# Patient Record
Sex: Female | Born: 1945 | Race: White | Hispanic: No | Marital: Single | Smoking: Former smoker
Health system: Southern US, Community
[De-identification: ages and names within clinical notes are randomized; demographics above are authoritative.]

## PROBLEM LIST (undated history)

## (undated) DIAGNOSIS — I10 Essential (primary) hypertension: Secondary | ICD-10-CM

## (undated) DIAGNOSIS — Z972 Presence of dental prosthetic device (complete) (partial): Secondary | ICD-10-CM

## (undated) DIAGNOSIS — E119 Type 2 diabetes mellitus without complications: Secondary | ICD-10-CM

## (undated) DIAGNOSIS — J449 Chronic obstructive pulmonary disease, unspecified: Secondary | ICD-10-CM

## (undated) DIAGNOSIS — G709 Myoneural disorder, unspecified: Secondary | ICD-10-CM

## (undated) DIAGNOSIS — M1990 Unspecified osteoarthritis, unspecified site: Secondary | ICD-10-CM

## (undated) DIAGNOSIS — R0600 Dyspnea, unspecified: Secondary | ICD-10-CM

## (undated) DIAGNOSIS — M25511 Pain in right shoulder: Secondary | ICD-10-CM

## (undated) DIAGNOSIS — E785 Hyperlipidemia, unspecified: Secondary | ICD-10-CM

## (undated) HISTORY — DX: Type 2 diabetes mellitus without complications: E11.9

## (undated) HISTORY — PX: CHOLECYSTECTOMY: SHX55

## (undated) HISTORY — DX: Essential (primary) hypertension: I10

## (undated) HISTORY — DX: Hyperlipidemia, unspecified: E78.5

## (undated) HISTORY — DX: Chronic obstructive pulmonary disease, unspecified: J44.9

## (undated) HISTORY — PX: EYE SURGERY: SHX253

## (undated) HISTORY — PX: ABDOMINAL HYSTERECTOMY: SHX81

## (undated) HISTORY — PX: TOTAL ABDOMINAL HYSTERECTOMY W/ BILATERAL SALPINGOOPHORECTOMY: SHX83

---

## 2000-06-17 HISTORY — PX: CARPAL TUNNEL RELEASE: SHX101

## 2012-12-17 ENCOUNTER — Ambulatory Visit: Payer: Self-pay | Admitting: Family Medicine

## 2014-12-28 DIAGNOSIS — J449 Chronic obstructive pulmonary disease, unspecified: Secondary | ICD-10-CM | POA: Diagnosis not present

## 2014-12-28 DIAGNOSIS — Z23 Encounter for immunization: Secondary | ICD-10-CM | POA: Diagnosis not present

## 2014-12-28 DIAGNOSIS — E1140 Type 2 diabetes mellitus with diabetic neuropathy, unspecified: Secondary | ICD-10-CM | POA: Diagnosis not present

## 2014-12-28 DIAGNOSIS — I10 Essential (primary) hypertension: Secondary | ICD-10-CM | POA: Diagnosis not present

## 2014-12-28 DIAGNOSIS — H449 Unspecified disorder of globe: Secondary | ICD-10-CM | POA: Diagnosis not present

## 2014-12-28 DIAGNOSIS — E785 Hyperlipidemia, unspecified: Secondary | ICD-10-CM | POA: Diagnosis not present

## 2014-12-28 DIAGNOSIS — Z0000 Encounter for general adult medical examination without abnormal findings: Secondary | ICD-10-CM | POA: Diagnosis not present

## 2015-01-01 ENCOUNTER — Ambulatory Visit: Payer: Self-pay | Admitting: Physician Assistant

## 2015-01-01 DIAGNOSIS — I10 Essential (primary) hypertension: Secondary | ICD-10-CM | POA: Diagnosis not present

## 2015-01-01 DIAGNOSIS — M5136 Other intervertebral disc degeneration, lumbar region: Secondary | ICD-10-CM | POA: Diagnosis not present

## 2015-01-28 DIAGNOSIS — I10 Essential (primary) hypertension: Secondary | ICD-10-CM | POA: Diagnosis not present

## 2015-01-28 DIAGNOSIS — E119 Type 2 diabetes mellitus without complications: Secondary | ICD-10-CM | POA: Diagnosis not present

## 2015-03-31 DIAGNOSIS — I10 Essential (primary) hypertension: Secondary | ICD-10-CM | POA: Diagnosis not present

## 2015-03-31 DIAGNOSIS — E785 Hyperlipidemia, unspecified: Secondary | ICD-10-CM | POA: Diagnosis not present

## 2015-03-31 DIAGNOSIS — E119 Type 2 diabetes mellitus without complications: Secondary | ICD-10-CM | POA: Diagnosis not present

## 2015-04-20 DIAGNOSIS — H2513 Age-related nuclear cataract, bilateral: Secondary | ICD-10-CM | POA: Diagnosis not present

## 2015-07-14 DIAGNOSIS — I10 Essential (primary) hypertension: Secondary | ICD-10-CM | POA: Insufficient documentation

## 2015-07-14 DIAGNOSIS — E1169 Type 2 diabetes mellitus with other specified complication: Secondary | ICD-10-CM | POA: Insufficient documentation

## 2015-07-14 DIAGNOSIS — E1142 Type 2 diabetes mellitus with diabetic polyneuropathy: Secondary | ICD-10-CM | POA: Insufficient documentation

## 2015-07-14 DIAGNOSIS — E785 Hyperlipidemia, unspecified: Secondary | ICD-10-CM | POA: Insufficient documentation

## 2015-07-14 DIAGNOSIS — E1159 Type 2 diabetes mellitus with other circulatory complications: Secondary | ICD-10-CM | POA: Insufficient documentation

## 2015-07-26 ENCOUNTER — Ambulatory Visit (INDEPENDENT_AMBULATORY_CARE_PROVIDER_SITE_OTHER): Payer: Medicare Other | Admitting: Family Medicine

## 2015-07-26 ENCOUNTER — Encounter: Payer: Self-pay | Admitting: Family Medicine

## 2015-07-26 VITALS — BP 110/69 | HR 60 | Temp 97.9°F | Ht 61.0 in | Wt 204.0 lb

## 2015-07-26 DIAGNOSIS — E119 Type 2 diabetes mellitus without complications: Secondary | ICD-10-CM | POA: Diagnosis not present

## 2015-07-26 DIAGNOSIS — I10 Essential (primary) hypertension: Secondary | ICD-10-CM | POA: Diagnosis not present

## 2015-07-26 DIAGNOSIS — M159 Polyosteoarthritis, unspecified: Secondary | ICD-10-CM | POA: Insufficient documentation

## 2015-07-26 DIAGNOSIS — M153 Secondary multiple arthritis: Secondary | ICD-10-CM

## 2015-07-26 DIAGNOSIS — E785 Hyperlipidemia, unspecified: Secondary | ICD-10-CM

## 2015-07-26 LAB — LP+ALT+AST PICCOLO, WAIVED
ALT (SGPT) Piccolo, Waived: 15 U/L (ref 10–47)
AST (SGOT) Piccolo, Waived: 17 U/L (ref 11–38)
Chol/HDL Ratio Piccolo,Waive: 3.4 mg/dL
Cholesterol Piccolo, Waived: 132 mg/dL (ref <200)
HDL Chol Piccolo, Waived: 39 mg/dL — ABNORMAL LOW (ref >59)
LDL Chol Calc Piccolo Waived: 68 mg/dL (ref <100)
Triglycerides Piccolo,Waived: 125 mg/dL (ref <150)
VLDL Chol Calc Piccolo,Waive: 25 mg/dL (ref <30)

## 2015-07-26 LAB — BAYER DCA HB A1C WAIVED: HB A1C (BAYER DCA - WAIVED): 8.3 % — ABNORMAL HIGH (ref <7.0)

## 2015-07-26 MED ORDER — INSULIN DETEMIR 100 UNIT/ML FLEXPEN: 60.0000 [IU] | PEN_INJECTOR | Freq: Every day | SUBCUTANEOUS | Status: DC

## 2015-07-26 MED ORDER — LOVASTATIN 40 MG PO TABS: 40.0000 mg | ORAL_TABLET | Freq: Every day | ORAL | Status: DC

## 2015-07-26 MED ORDER — INSULIN LISPRO 100 UNIT/ML (KWIKPEN): 10.0000 [IU] | PEN_INJECTOR | Freq: Three times a day (TID) | SUBCUTANEOUS | Status: DC

## 2015-07-26 MED ORDER — SAXAGLIPTIN HCL 5 MG PO TABS: 5.0000 mg | ORAL_TABLET | Freq: Every day | ORAL | Status: DC

## 2015-07-26 MED ORDER — CLONIDINE HCL 0.1 MG PO TABS: 0.1000 mg | ORAL_TABLET | Freq: Two times a day (BID) | ORAL | Status: DC

## 2015-07-26 MED ORDER — ATENOLOL 100 MG PO TABS: 100.0000 mg | ORAL_TABLET | Freq: Every day | ORAL | Status: DC

## 2015-07-26 MED ORDER — SPIRONOLACTONE 25 MG PO TABS: 25.0000 mg | ORAL_TABLET | Freq: Every day | ORAL | Status: DC

## 2015-07-26 NOTE — Assessment & Plan Note (Signed)
Problems with left knee and left shoulder will refer to orthopedics

## 2015-07-26 NOTE — Assessment & Plan Note (Signed)
The current medical regimen is effective;  continue present plan and medications.  

## 2015-07-26 NOTE — Assessment & Plan Note (Signed)
With patient's blood pressure and edema will stop amlodipine patient to hold prescription We'll start spironolactone and observe response

## 2015-07-26 NOTE — Assessment & Plan Note (Signed)
With hemoglobin A1c of 8.3 today indicating poor control discuss adding 10 units of insulin at lunchtime With stress decreasing with daughter moving out we will try to do better with diet
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
## 2015-07-27 ENCOUNTER — Encounter: Payer: Self-pay | Admitting: Family Medicine

## 2015-07-27 LAB — BASIC METABOLIC PANEL
BUN/Creatinine Ratio: 12 (ref 11–26)
BUN: 9 mg/dL (ref 8–27)
CO2: 26 mmol/L (ref 18–29)
Calcium: 9.5 mg/dL (ref 8.7–10.3)
Chloride: 100 mmol/L (ref 97–108)
Creatinine, Ser: 0.75 mg/dL (ref 0.57–1.00)
GFR calc Af Amer: 95 mL/min/{1.73_m2} (ref >59)
GFR calc non Af Amer: 83 mL/min/{1.73_m2} (ref >59)
Glucose: 183 mg/dL — ABNORMAL HIGH (ref 65–99)
Potassium: 4.7 mmol/L (ref 3.5–5.2)
Sodium: 141 mmol/L (ref 134–144)

## 2015-08-18 DIAGNOSIS — H538 Other visual disturbances: Secondary | ICD-10-CM | POA: Diagnosis not present

## 2015-08-27 DIAGNOSIS — H268 Other specified cataract: Secondary | ICD-10-CM | POA: Diagnosis not present

## 2015-09-14 DIAGNOSIS — H25043 Posterior subcapsular polar age-related cataract, bilateral: Secondary | ICD-10-CM | POA: Diagnosis not present

## 2015-09-20 ENCOUNTER — Ambulatory Visit
Admission: RE | Admit: 2015-09-20 | Discharge: 2015-09-20 | Disposition: A | Discharge status: Home / Self Care | Payer: Medicare Other | Source: Ambulatory Visit | Attending: Ophthalmology | Admitting: Ophthalmology

## 2015-09-20 ENCOUNTER — Ambulatory Visit: Payer: Medicare Other | Admitting: Anesthesiology

## 2015-09-20 ENCOUNTER — Encounter
Admission: RE | Disposition: A | Discharge status: Home / Self Care | Payer: Self-pay | Source: Ambulatory Visit | Attending: Ophthalmology

## 2015-09-20 DIAGNOSIS — E7800 Pure hypercholesterolemia, unspecified: Secondary | ICD-10-CM | POA: Diagnosis not present

## 2015-09-20 DIAGNOSIS — I10 Essential (primary) hypertension: Secondary | ICD-10-CM | POA: Diagnosis not present

## 2015-09-20 DIAGNOSIS — Z888 Allergy status to other drugs, medicaments and biological substances status: Secondary | ICD-10-CM | POA: Insufficient documentation

## 2015-09-20 DIAGNOSIS — Z87891 Personal history of nicotine dependence: Secondary | ICD-10-CM | POA: Diagnosis not present

## 2015-09-20 DIAGNOSIS — Z9049 Acquired absence of other specified parts of digestive tract: Secondary | ICD-10-CM | POA: Insufficient documentation

## 2015-09-20 DIAGNOSIS — E119 Type 2 diabetes mellitus without complications: Secondary | ICD-10-CM | POA: Diagnosis not present

## 2015-09-20 DIAGNOSIS — Z79899 Other long term (current) drug therapy: Secondary | ICD-10-CM | POA: Insufficient documentation

## 2015-09-20 DIAGNOSIS — H25042 Posterior subcapsular polar age-related cataract, left eye: Secondary | ICD-10-CM | POA: Insufficient documentation

## 2015-09-20 DIAGNOSIS — M1990 Unspecified osteoarthritis, unspecified site: Secondary | ICD-10-CM | POA: Diagnosis not present

## 2015-09-20 DIAGNOSIS — H269 Unspecified cataract: Secondary | ICD-10-CM | POA: Diagnosis present

## 2015-09-20 DIAGNOSIS — H25043 Posterior subcapsular polar age-related cataract, bilateral: Secondary | ICD-10-CM | POA: Diagnosis not present

## 2015-09-20 DIAGNOSIS — Z794 Long term (current) use of insulin: Secondary | ICD-10-CM | POA: Diagnosis not present

## 2015-09-20 HISTORY — PX: CATARACT EXTRACTION W/PHACO: SHX586

## 2015-09-20 HISTORY — DX: Unspecified osteoarthritis, unspecified site: M19.90

## 2015-09-20 LAB — GLUCOSE, CAPILLARY
Glucose-Capillary: 180 mg/dL — ABNORMAL HIGH (ref 65–99)
Glucose-Capillary: 212 mg/dL — ABNORMAL HIGH (ref 65–99)

## 2015-09-20 SURGERY — PHACOEMULSIFICATION, CATARACT, WITH IOL INSERTION
Anesthesia: Monitor Anesthesia Care | Laterality: Left | Wound class: Clean

## 2015-09-20 MED ORDER — MIDAZOLAM HCL 5 MG/5ML IJ SOLN
INTRAMUSCULAR | Status: DC | PRN
  Administered 2015-09-20: 2 mg via INTRAVENOUS

## 2015-09-20 MED ORDER — NA HYALUR & NA CHOND-NA HYALUR 0.4-0.35 ML IO KIT
PACK | INTRAOCULAR | Status: DC | PRN
  Administered 2015-09-20: 1 mL via INTRAOCULAR

## 2015-09-20 MED ORDER — TIMOLOL MALEATE 0.5 % OP SOLN
OPHTHALMIC | Status: DC | PRN
  Administered 2015-09-20: 1 [drp] via OPHTHALMIC

## 2015-09-20 MED ORDER — CEFUROXIME OPHTHALMIC INJECTION 1 MG/0.1 ML
INJECTION | OPHTHALMIC | Status: DC | PRN
  Administered 2015-09-20: .3 mL via INTRACAMERAL

## 2015-09-20 MED ORDER — LIDOCAINE HCL (PF) 4 % IJ SOLN
INTRAOCULAR | Status: DC | PRN
  Administered 2015-09-20: 2 mL via OPHTHALMIC

## 2015-09-20 MED ORDER — BSS IO SOLN
INTRAOCULAR | Status: DC | PRN
  Administered 2015-09-20: 174 mL via OPHTHALMIC
  Administered 2015-09-20: 08:00:00 via OPHTHALMIC

## 2015-09-20 MED ORDER — FENTANYL CITRATE (PF) 100 MCG/2ML IJ SOLN
INTRAMUSCULAR | Status: DC | PRN
  Administered 2015-09-20: 50 ug via INTRAVENOUS

## 2015-09-20 MED ORDER — PROPARACAINE HCL 0.5 % OP SOLN
1.0000 [drp] | Freq: Once | OPHTHALMIC | Status: AC
  Administered 2015-09-20: 1 [drp] via OPHTHALMIC

## 2015-09-20 MED ORDER — BRIMONIDINE TARTRATE 0.2 % OP SOLN
OPHTHALMIC | Status: DC | PRN
  Administered 2015-09-20: 1 [drp] via OPHTHALMIC

## 2015-09-20 MED ORDER — LACTATED RINGERS IV SOLN: INTRAVENOUS | Status: DC

## 2015-09-20 MED ORDER — OXYCODONE HCL 5 MG PO TABS: 5.0000 mg | ORAL_TABLET | Freq: Once | ORAL | Status: DC | PRN

## 2015-09-20 MED ORDER — ARMC OPHTHALMIC DILATING GEL
1.0000 "application " | OPHTHALMIC | Status: DC | PRN
  Administered 2015-09-20 (×2): 1 via OPHTHALMIC

## 2015-09-20 MED ORDER — OXYCODONE HCL 5 MG/5ML PO SOLN: 5.0000 mg | Freq: Once | ORAL | Status: DC | PRN

## 2015-09-20 MED ORDER — POVIDONE-IODINE 5 % OP SOLN
1.0000 "application " | OPHTHALMIC | Status: DC | PRN
  Administered 2015-09-20: 1 via OPHTHALMIC

## 2015-09-20 SURGICAL SUPPLY — 29 items
APPLICATOR COTTON TIP 3IN (MISCELLANEOUS) ×2 IMPLANT
CANNULA ANT/CHMB 27GA (MISCELLANEOUS) ×2 IMPLANT
DISSECTOR HYDRO NUCLEUS 50X22 (MISCELLANEOUS) ×2 IMPLANT
GLOVE BIO SURGEON STRL SZ7 (GLOVE) ×2 IMPLANT
GLOVE SURG LX 6.5 MICRO (GLOVE) ×1
GLOVE SURG LX STRL 6.5 MICRO (GLOVE) ×1 IMPLANT
GOWN STRL REUS W/ TWL LRG LVL3 (GOWN DISPOSABLE) ×2 IMPLANT
GOWN STRL REUS W/TWL LRG LVL3 (GOWN DISPOSABLE) ×2
LENS IOL ACRSF IQ PC 22.0 (Intraocular Lens) ×1 IMPLANT
LENS IOL ACRYSOF IQ POST 22.0 (Intraocular Lens) ×2 IMPLANT
MARKER SKIN SURG W/RULER VIO (MISCELLANEOUS) ×2 IMPLANT
NEEDLE FILTER BLUNT 18X 1/2SAF (NEEDLE) ×1
NEEDLE FILTER BLUNT 18X1 1/2 (NEEDLE) ×1 IMPLANT
PACK CATARACT BRASINGTON (MISCELLANEOUS) ×2 IMPLANT
PACK EYE AFTER SURG (MISCELLANEOUS) ×2 IMPLANT
PACK OPTHALMIC (MISCELLANEOUS) ×2 IMPLANT
RING MALYGIN 7.0 (MISCELLANEOUS) IMPLANT
SOL BAL SALT 15ML (MISCELLANEOUS)
SOLUTION BAL SALT 15ML (MISCELLANEOUS) IMPLANT
SUT ETHILON 10-0 CS-B-6CS-B-6 (SUTURE)
SUT VICRYL  9 0 (SUTURE)
SUT VICRYL 9 0 (SUTURE) IMPLANT
SUTURE EHLN 10-0 CS-B-6CS-B-6 (SUTURE) IMPLANT
SYR 3ML LL SCALE MARK (SYRINGE) ×2 IMPLANT
SYR TB 1ML LUER SLIP (SYRINGE) ×2 IMPLANT
WATER STERILE IRR 250ML POUR (IV SOLUTION) ×2 IMPLANT
WATER STERILE IRR 500ML POUR (IV SOLUTION) IMPLANT
WICK EYE OCUCEL (MISCELLANEOUS) IMPLANT
WIPE NON LINTING 3.25X3.25 (MISCELLANEOUS) ×2 IMPLANT

## 2015-09-20 NOTE — Anesthesia Preprocedure Evaluation (Signed)
Anesthesia Evaluation    Airway Mallampati: II  TM Distance: >3 FB Neck ROM: Full    Dental no notable dental hx.    Pulmonary COPD, former smoker,    Pulmonary exam normal breath sounds clear to auscultation       Cardiovascular hypertension, Normal cardiovascular exam Rhythm:Regular Rate:Normal     Neuro/Psych    GI/Hepatic   Endo/Other  diabetes, Type 2  Renal/GU      Musculoskeletal   Abdominal   Peds  Hematology   Anesthesia Other Findings   Reproductive/Obstetrics                             Anesthesia Physical Anesthesia Plan  ASA: III  Anesthesia Plan: MAC   Post-op Pain Management:    Induction: Intravenous  Airway Management Planned:   Additional Equipment:   Intra-op Plan:   Post-operative Plan: Extubation in OR  Informed Consent: I have reviewed the patients History and Physical, chart, labs and discussed the procedure including the risks, benefits and alternatives for the proposed anesthesia with the patient or authorized representative who has indicated his/her understanding and acceptance.   Dental advisory given  Plan Discussed with: CRNA  Anesthesia Plan Comments:         Anesthesia Quick Evaluation

## 2015-09-20 NOTE — Discharge Instructions (Signed)

## 2015-09-20 NOTE — Transfer of Care (Signed)
Immediate Anesthesia Transfer of Care Note  Patient: Tami Cummings  Procedure(s) Performed: Procedure(s) with comments: CATARACT EXTRACTION PHACO AND INTRAOCULAR LENS PLACEMENT (IOC) (Left) - DIABETIC - insulin  Patient Location: PACU  Anesthesia Type: MAC  Level of Consciousness: awake, alert  and patient cooperative  Airway and Oxygen Therapy: Patient Spontanous Breathing and Patient connected to supplemental oxygen  Post-op Assessment: Post-op Vital signs reviewed, Patient's Cardiovascular Status Stable, Respiratory Function Stable, Patent Airway and No signs of Nausea or vomiting  Post-op Vital Signs: Reviewed and stable  Complications: No apparent anesthesia complications

## 2015-09-20 NOTE — Anesthesia Procedure Notes (Signed)
Procedure Name: MAC Performed by: Pansey Pinheiro Pre-anesthesia Checklist: Patient identified, Emergency Drugs available, Suction available, Timeout performed and Patient being monitored Patient Re-evaluated:Patient Re-evaluated prior to inductionOxygen Delivery Method: Nasal cannula Placement Confirmation: positive ETCO2     

## 2015-09-20 NOTE — H&P (Signed)
H+P reviewed and is up to date, please see paper chart.  

## 2015-09-20 NOTE — Op Note (Signed)
Date of Surgery: 09/20/2015  PREOPERATIVE DIAGNOSES: Visually significant posterior subcapsular cataract, left eye.  POSTOPERATIVE DIAGNOSES: Same  PROCEDURES PERFORMED: Cataract extraction with intraocular lens implant, left eye.  SURGEON: Almon Hercules, M.D.  ANESTHESIA: MAC and topical  IMPLANTS: AcrySof IQ [id number] +22.0   Implant Name Type Inv. Item Serial No. Manufacturer Lot No. LRB No. Used  IMPLANT LENS - [id number] Intraocular Lens IMPLANT LENS [id number] ALCON   Left 1    COMPLICATIONS: None.  DESCRIPTION OF PROCEDURE: Therapeutic options were discussed with the patient preoperatively, including a discussion of risks and benefits of surgery. Informed consent was obtained. An IOL-Master and immersion biometry were used to take the lens measurements, and a dilated fundus exam was performed within 6 months of the surgical date.  The patient was premedicated and brought to the operating room and placed on the operating table in the supine position. After adequate anesthesia, the patient was prepped and draped in the usual sterile ophthalmic fashion. A wire lid speculum was inserted and the microscope was positioned. A Superblade was used to create a paracentesis site at the limbus and a small amount of dilute preservative free lidocaine was instilled into the anterior chamber, followed by dispersive viscoelastic. A clear corneal incision was created temporally using a 2.4 mm keratome blade. Capsulorrhexis was then performed. In situ phacoemulsification was performed.  Cortical material was removed with the irrigation-aspiration unit. Dispersive viscoelastic was instilled to open the capsular bag. A posterior chamber intraocular lens with the specifications above was inserted and positioned. Irrigation-aspiration was used to remove all viscoelastic. Cefuroxime 1cc was instilled into the anterior chamber, and the corneal incision was checked and found to be water tight. The eyelid  speculum was removed.  The operative eye was covered with protective goggles after instilling 1 drop of timolol and brimonidine. The patient tolerated the procedure well. There were no complications.

## 2015-09-20 NOTE — Anesthesia Postprocedure Evaluation (Signed)
  Anesthesia Post-op Note  Patient: Tami Cummings  Procedure(s) Performed: Procedure(s) with comments: CATARACT EXTRACTION PHACO AND INTRAOCULAR LENS PLACEMENT (IOC) (Left) - DIABETIC - insulin  Anesthesia type:MAC  Patient location: PACU  Post pain: Pain level controlled  Post assessment: Post-op Vital signs reviewed, Patient's Cardiovascular Status Stable, Respiratory Function Stable, Patent Airway and No signs of Nausea or vomiting  Post vital signs: Reviewed and stable  Last Vitals:  Filed Vitals:   09/20/15 0829  BP: 141/71  Pulse: 60  Temp:   Resp: 17    Level of consciousness: awake, alert  and patient cooperative  Complications: No apparent anesthesia complications

## 2015-09-21 ENCOUNTER — Encounter: Payer: Self-pay | Admitting: Ophthalmology

## 2015-09-27 ENCOUNTER — Encounter: Payer: Self-pay | Admitting: Family Medicine

## 2015-09-27 ENCOUNTER — Ambulatory Visit (INDEPENDENT_AMBULATORY_CARE_PROVIDER_SITE_OTHER): Payer: Medicare Other | Admitting: Family Medicine

## 2015-09-27 VITALS — BP 192/80 | HR 69 | Temp 98.2°F | Ht 61.3 in | Wt 206.0 lb

## 2015-09-27 DIAGNOSIS — I10 Essential (primary) hypertension: Secondary | ICD-10-CM | POA: Diagnosis not present

## 2015-09-27 DIAGNOSIS — E119 Type 2 diabetes mellitus without complications: Secondary | ICD-10-CM | POA: Diagnosis not present

## 2015-09-27 DIAGNOSIS — M153 Secondary multiple arthritis: Secondary | ICD-10-CM

## 2015-09-27 DIAGNOSIS — Z23 Encounter for immunization: Secondary | ICD-10-CM | POA: Diagnosis not present

## 2015-09-27 MED ORDER — CLONIDINE HCL 0.2 MG PO TABS: 0.2000 mg | ORAL_TABLET | Freq: Two times a day (BID) | ORAL | Status: DC

## 2015-09-27 NOTE — Progress Notes (Signed)
BP 192/80 mmHg  Pulse 69  Temp(Src) 98.2 F (36.8 C)  Ht 5' 1.3" (1.557 m)  Wt 206 lb (93.441 kg)  BMI 38.54 kg/m2  SpO2 99%   Subjective:    [name], female    DOB: 1946-07-25, 69 y.o.   MRN: [id number]  HPI: Tami Cummings is a 69 y.o. female  Chief Complaint  Patient presents with  . Hypertension   patient recheck blood pressure, blood pressures been markedly elevated ever since stopping amlodipine for 3+ edema in her legs. She was given spironolactone to help both blood pressure and help with edema. Patient's edema is largely resolved with only complaints of trace edema now. Blood pressure though has remained markedly elevated unless she takes 2 atenolol. Has not taken her clonidine today.  Patient's diabetes is been elevated as had a lot of nerve issues having eye surgery for cataracts. Patient's been eating a lot of ice cream  Relevant past medical, surgical, family and social history reviewed and updated as indicated. Interim medical history since our last visit reviewed. Allergies and medications reviewed and updated.  Review of Systems  Constitutional: Negative.   Respiratory: Negative.   Cardiovascular: Negative.     Per HPI unless specifically indicated above     Objective:    BP 192/80 mmHg  Pulse 69  Temp(Src) 98.2 F (36.8 C)  Ht 5' 1.3" (1.557 m)  Wt 206 lb (93.441 kg)  BMI 38.54 kg/m2  SpO2 99%  Wt Readings from Last 3 Encounters:  09/27/15 206 lb (93.441 kg)  09/20/15 205 lb (92.987 kg)  07/26/15 204 lb (92.534 kg)    Physical Exam  Constitutional: She is oriented to person, place, and time. She appears well-developed and well-nourished. No distress.  HENT:  Head: Normocephalic and atraumatic.  Right Ear: Hearing normal.  Left Ear: Hearing normal.  Nose: Nose normal.  Eyes: Conjunctivae and lids are normal. Right eye exhibits no discharge. Left eye exhibits no discharge. No scleral icterus.  Cardiovascular:  Normal rate and regular rhythm.   Pulmonary/Chest: Effort normal and breath sounds normal. No respiratory distress.  Musculoskeletal: Normal range of motion.  Neurological: She is alert and oriented to person, place, and time.  Skin: Skin is intact. No rash noted.  Psychiatric: She has a normal mood and affect. Her speech is normal and behavior is normal. Judgment and thought content normal. Cognition and memory are normal.    Results for orders placed or performed during the hospital encounter of 09/20/15  Glucose, capillary  Result Value Ref Range   Glucose-Capillary 180 (H) 65 - 99 mg/dL  Glucose, capillary  Result Value Ref Range   Glucose-Capillary 212 (H) 65 - 99 mg/dL      Assessment & Plan:   Problem List Items Addressed This Visit      Cardiovascular and Mediastinum   Hypertension    Discuss poor control will increase clonidine to 0.2 twice a day      Relevant Medications   cloNIDine (CATAPRES) 0.2 MG tablet     Endocrine   Diabetes mellitus without complication ([place])    Discussed diabetes weight and ice cream Patient will do better especially now that her eyes are done. And is seeing better and drove for the first time today.      Relevant Orders   Basic metabolic panel     Musculoskeletal and Integument   DJD (degenerative joint disease), multiple sites    Discussed patient has follow-up appointment  with orthopedics hasn't been yet because was working on her eyes.       Other Visit Diagnoses    Essential hypertension, benign    -  Primary    Relevant Medications    cloNIDine (CATAPRES) 0.2 MG tablet    Other Relevant Orders    Basic metabolic panel    Immunization due        Relevant Orders    Flu Vaccine QUAD 36+ mos PF IM (Fluarix & Fluzone Quad PF) (Completed)        Follow up plan: Return in about 4 weeks (around 10/25/2015) for Recheck blood pressure and diabetes with BMP,  and A1c.

## 2015-09-27 NOTE — Assessment & Plan Note (Signed)
Discussed diabetes weight and ice cream Patient will do better especially now that her eyes are done. And is seeing better and drove for the first time today.

## 2015-09-27 NOTE — Assessment & Plan Note (Signed)
Discussed patient has follow-up appointment with orthopedics hasn't been yet because was working on her eyes.

## 2015-09-27 NOTE — Assessment & Plan Note (Signed)
Discuss poor control will increase clonidine to 0.2 twice a day

## 2015-09-28 ENCOUNTER — Encounter: Payer: Self-pay | Admitting: Family Medicine

## 2015-09-28 LAB — BASIC METABOLIC PANEL
BUN/Creatinine Ratio: 15 (ref 11–26)
BUN: 14 mg/dL (ref 8–27)
CO2: 27 mmol/L (ref 18–29)
Calcium: 9.6 mg/dL (ref 8.7–10.3)
Chloride: 100 mmol/L (ref 97–108)
Creatinine, Ser: 0.92 mg/dL (ref 0.57–1.00)
GFR calc Af Amer: 74 mL/min/{1.73_m2} (ref >59)
GFR calc non Af Amer: 64 mL/min/{1.73_m2} (ref >59)
Glucose: 141 mg/dL — ABNORMAL HIGH (ref 65–99)
Potassium: 4.6 mmol/L (ref 3.5–5.2)
Sodium: 140 mmol/L (ref 134–144)

## 2015-09-29 ENCOUNTER — Telehealth: Payer: Self-pay

## 2015-09-29 DIAGNOSIS — E119 Type 2 diabetes mellitus without complications: Secondary | ICD-10-CM

## 2015-09-29 NOTE — Telephone Encounter (Signed)
PATIENT: Tami Cummings DOB: [birth year] LAST VISIT: 09/27/2015  Patient is requesting a 3 month supply for Levemir Flextouch inject. Pharmacy notes please send new Rx with 34mo supply.

## 2015-09-30 MED ORDER — INSULIN DETEMIR 100 UNIT/ML FLEXPEN: 60.0000 [IU] | PEN_INJECTOR | Freq: Every day | SUBCUTANEOUS | Status: DC

## 2015-10-25 ENCOUNTER — Ambulatory Visit (INDEPENDENT_AMBULATORY_CARE_PROVIDER_SITE_OTHER): Payer: Medicare Other | Admitting: Family Medicine

## 2015-10-25 ENCOUNTER — Encounter: Payer: Self-pay | Admitting: Family Medicine

## 2015-10-25 VITALS — BP 163/80 | HR 67 | Temp 98.7°F | Ht 60.2 in | Wt 207.0 lb

## 2015-10-25 DIAGNOSIS — Z1211 Encounter for screening for malignant neoplasm of colon: Secondary | ICD-10-CM | POA: Diagnosis not present

## 2015-10-25 DIAGNOSIS — E119 Type 2 diabetes mellitus without complications: Secondary | ICD-10-CM | POA: Diagnosis not present

## 2015-10-25 DIAGNOSIS — I10 Essential (primary) hypertension: Secondary | ICD-10-CM

## 2015-10-25 LAB — MICROALBUMIN, URINE WAIVED
Creatinine, Urine Waived: 50 mg/dL (ref 10–300)
Microalb, Ur Waived: 80 mg/L — ABNORMAL HIGH (ref 0–19)
Microalb/Creat Ratio: >300 mg/g — ABNORMAL HIGH (ref <30)

## 2015-10-25 LAB — FECAL OCCULT BLOOD, GUAIAC
Specimen 1: NEGATIVE
Specimen 2: NEGATIVE
Specimen 3: NEGATIVE

## 2015-10-25 LAB — BAYER DCA HB A1C WAIVED: HB A1C (BAYER DCA - WAIVED): 8.6 % — ABNORMAL HIGH (ref <7.0)

## 2015-10-25 MED ORDER — AMLODIPINE BESYLATE 10 MG PO TABS: 10.0000 mg | ORAL_TABLET | Freq: Every day | ORAL | Status: DC

## 2015-10-25 NOTE — Assessment & Plan Note (Signed)
Discussed diabetes poor control and getting worse. Discuss use of adjusting insulin After discussion it was decided for referral to endocrinology to further manage and adjust insulin and adding other medications as indicated. Will make referral, patient prefers [facility] endocrinology

## 2015-10-25 NOTE — Assessment & Plan Note (Signed)
Discussed with patient for controlled diabetes and except we'll side effects of leg edema with amlodipine Patient will restart amlodipine 10 mg patient has a big bottle We'll follow up blood pressure BMP 1 month

## 2015-10-25 NOTE — Progress Notes (Signed)
BP 163/80 mmHg  Pulse 67  Temp(Src) 98.7 F (37.1 C)  Ht 5' 0.2" (1.529 m)  Wt 207 lb (93.895 kg)  BMI 40.16 kg/m2  SpO2 97%   Subjective:    [name], female    DOB: 1946-05-02, 69 y.o.   MRN: [id number]  HPI: Tami Cummings is a 69 y.o. female  Chief Complaint  Patient presents with  . Diabetes  . Hypertension   discussed diabetes poor control patient difficulty with insulin-dependent adjusting insulin Patient difficulty with diet and exercise nutrition Also elevated blood pressure ever since stopping amlodipine. Patient felt better on amlodipine 10 mg with some foot swelling but in retrospect it wasn't that bad and still has some foot ankle swelling. Patient wants to go back on amlodipine for better blood pressure control and feeling better will wear support hose for leg swelling.   Relevant past medical, surgical, family and social history reviewed and updated as indicated. Interim medical history since our last visit reviewed. Allergies and medications reviewed and updated.  Review of Systems  Constitutional: Negative.   Respiratory: Negative.   Cardiovascular: Negative.     Per HPI unless specifically indicated above     Objective:    BP 163/80 mmHg  Pulse 67  Temp(Src) 98.7 F (37.1 C)  Ht 5' 0.2" (1.529 m)  Wt 207 lb (93.895 kg)  BMI 40.16 kg/m2  SpO2 97%  Wt Readings from Last 3 Encounters:  10/25/15 207 lb (93.895 kg)  09/27/15 206 lb (93.441 kg)  09/20/15 205 lb (92.987 kg)    Physical Exam  Constitutional: She is oriented to person, place, and time. She appears well-developed and well-nourished. No distress.  HENT:  Head: Normocephalic and atraumatic.  Right Ear: Hearing normal.  Left Ear: Hearing normal.  Nose: Nose normal.  Eyes: Conjunctivae and lids are normal. Right eye exhibits no discharge. Left eye exhibits no discharge. No scleral icterus.  Cardiovascular: Normal rate, regular rhythm and normal heart  sounds.   Pulmonary/Chest: Effort normal and breath sounds normal. No respiratory distress.  Musculoskeletal: Normal range of motion.  1+ ankle edema  Neurological: She is alert and oriented to person, place, and time.  Skin: Skin is intact. No rash noted.  Psychiatric: She has a normal mood and affect. Her speech is normal and behavior is normal. Judgment and thought content normal. Cognition and memory are normal.    Results for orders placed or performed in visit on 10/25/15  [facility] Stool Card-TAKE HOME  Result Value Ref Range   Specimen 1 neg    Specimen 2 neg    Specimen 3 neg       Assessment & Plan:   Problem List Items Addressed This Visit      Cardiovascular and Mediastinum   Hypertension    Discussed with patient for controlled diabetes and except we'll side effects of leg edema with amlodipine Patient will restart amlodipine 10 mg patient has a big bottle We'll follow up blood pressure BMP 1 month      Relevant Medications   amLODipine (NORVASC) 10 MG tablet     Endocrine   Diabetes mellitus without complication ([place]) - Primary    Discussed diabetes poor control and getting worse. Discuss use of adjusting insulin After discussion it was decided for referral to endocrinology to further manage and adjust insulin and adding other medications as indicated. Will make referral, patient prefers [facility]      Relevant Orders  Bayer DCA Hb A1c Waived   Ambulatory referral to Endocrinology    Other Visit Diagnoses    Essential hypertension, benign        Relevant Medications    amLODipine (NORVASC) 10 MG tablet    Other Relevant Orders    Bayer DCA Hb A1c Waived    Microalbumin, Urine Waived    Basic metabolic panel    Colon cancer screening        Relevant Orders    [facility] Stool Card-TAKE HOME (Completed)        Follow up plan: Return in about 4 weeks (around 11/22/2015) for BMP and blood pressure check.

## 2015-10-26 ENCOUNTER — Encounter: Payer: Self-pay | Admitting: Family Medicine

## 2015-10-26 LAB — BASIC METABOLIC PANEL
BUN/Creatinine Ratio: 13 (ref 11–26)
BUN: 11 mg/dL (ref 8–27)
CO2: 25 mmol/L (ref 18–29)
Calcium: 9.1 mg/dL (ref 8.7–10.3)
Chloride: 103 mmol/L (ref 97–106)
Creatinine, Ser: 0.85 mg/dL (ref 0.57–1.00)
GFR calc Af Amer: 81 mL/min/{1.73_m2} (ref >59)
GFR calc non Af Amer: 71 mL/min/{1.73_m2} (ref >59)
Glucose: 170 mg/dL — ABNORMAL HIGH (ref 65–99)
Potassium: 4.5 mmol/L (ref 3.5–5.2)
Sodium: 142 mmol/L (ref 136–144)

## 2015-11-03 DIAGNOSIS — Z794 Long term (current) use of insulin: Secondary | ICD-10-CM | POA: Diagnosis not present

## 2015-11-03 DIAGNOSIS — E1165 Type 2 diabetes mellitus with hyperglycemia: Secondary | ICD-10-CM | POA: Diagnosis not present

## 2015-11-22 ENCOUNTER — Ambulatory Visit (INDEPENDENT_AMBULATORY_CARE_PROVIDER_SITE_OTHER): Payer: Medicare Other | Admitting: Family Medicine

## 2015-11-22 ENCOUNTER — Encounter: Payer: Self-pay | Admitting: Family Medicine

## 2015-11-22 VITALS — BP 110/68 | HR 59 | Temp 97.7°F | Ht 60.2 in | Wt 206.0 lb

## 2015-11-22 DIAGNOSIS — E119 Type 2 diabetes mellitus without complications: Secondary | ICD-10-CM | POA: Diagnosis not present

## 2015-11-22 DIAGNOSIS — I10 Essential (primary) hypertension: Secondary | ICD-10-CM | POA: Diagnosis not present

## 2015-11-22 NOTE — Assessment & Plan Note (Signed)
The current medical regimen is effective;  continue present plan and medications.  

## 2015-11-22 NOTE — Progress Notes (Signed)
BP 110/68 mmHg  Pulse 59  Temp(Src) 97.7 F (36.5 C)  Ht 5' 0.2" (1.529 m)  Wt 206 lb (93.441 kg)  BMI 39.97 kg/m2  SpO2 99%   Subjective:    [name], female    DOB: February 06, 1946, 69 y.o.   MRN: [id number]  HPI: Tami Cummings is a 69 y.o. female  Chief Complaint  Patient presents with  . Hypertension   patient doing very well with taking amlodipine again blood pressure is been good Has developed some ankle edema but it's nothing much goes down overnight and is not bothersome. Patient has some compression socks but not wearing them yet. Patient's been to endocrinology for diabetes and is been referred to dietitian also. Hasn't been yet but is going Had an episode of low blood sugar but took her insulin without eating. Is doing better with eating a small amount of breakfast with her shot in the morning. Also has follow-up appointments with endocrinology.  Relevant past medical, surgical, family and social history reviewed and updated as indicated. Interim medical history since our last visit reviewed. Allergies and medications reviewed and updated.  Review of Systems  Constitutional: Negative.   Respiratory: Negative.   Cardiovascular: Negative.     Per HPI unless specifically indicated above     Objective:    BP 110/68 mmHg  Pulse 59  Temp(Src) 97.7 F (36.5 C)  Ht 5' 0.2" (1.529 m)  Wt 206 lb (93.441 kg)  BMI 39.97 kg/m2  SpO2 99%  Wt Readings from Last 3 Encounters:  11/22/15 206 lb (93.441 kg)  10/25/15 207 lb (93.895 kg)  09/27/15 206 lb (93.441 kg)    Physical Exam  Constitutional: She is oriented to person, place, and time. She appears well-developed and well-nourished. No distress.  HENT:  Head: Normocephalic and atraumatic.  Right Ear: Hearing normal.  Left Ear: Hearing normal.  Nose: Nose normal.  Eyes: Conjunctivae and lids are normal. Right eye exhibits no discharge. Left eye exhibits no discharge. No scleral  icterus.  Cardiovascular: Normal rate, regular rhythm and normal heart sounds.   Pulmonary/Chest: Effort normal and breath sounds normal. No respiratory distress.  Musculoskeletal: Normal range of motion.  Neurological: She is alert and oriented to person, place, and time.  Skin: Skin is intact. No rash noted.  Psychiatric: She has a normal mood and affect. Her speech is normal and behavior is normal. Judgment and thought content normal. Cognition and memory are normal.    Results for orders placed or performed in visit on 10/25/15  Bayer DCA Hb A1c Waived  Result Value Ref Range   Bayer DCA Hb A1c Waived 8.6 (H) <7.0 %  Microalbumin, Urine Waived  Result Value Ref Range   Microalb, Ur Waived 80 (H) 0 - 19 mg/L   Creatinine, Urine Waived 50 10 - 300 mg/dL   Microalb/Creat Ratio >300 (H) <30 mg/g  Basic metabolic panel  Result Value Ref Range   Glucose 170 (H) 65 - 99 mg/dL   BUN 11 8 - 27 mg/dL   Creatinine, Ser 0.85 0.57 - 1.00 mg/dL   GFR calc non Af Amer 71 >59 mL/min/1.73   GFR calc Af Amer 81 >59 mL/min/1.73   BUN/Creatinine Ratio 13 11 - 26   Sodium 142 136 - 144 mmol/L   Potassium 4.5 3.5 - 5.2 mmol/L   Chloride 103 97 - 106 mmol/L   CO2 25 18 - 29 mmol/L   Calcium 9.1 8.7 - 10.3  mg/dL  [facility] Stool Card-TAKE HOME  Result Value Ref Range   Specimen 1 neg    Specimen 2 neg    Specimen 3 neg       Assessment & Plan:   Problem List Items Addressed This Visit      Cardiovascular and Mediastinum   Hypertension    The current medical regimen is effective;  continue present plan and medications.         Endocrine   Diabetes mellitus without complication ([place])    Followed in endocrinology Reviewed briefly insulin dosing and hyperglycemia and eating       Other Visit Diagnoses    Essential hypertension, benign    -  Primary    Relevant Orders    Basic metabolic panel        Follow up plan: Return in about 4 weeks (around 12/20/2015) for Physical Exam no  a1c.

## 2015-11-22 NOTE — Assessment & Plan Note (Signed)
Followed in endocrinology Reviewed briefly insulin dosing and hyperglycemia and eating

## 2015-11-23 ENCOUNTER — Encounter: Payer: Self-pay | Admitting: Family Medicine

## 2015-11-23 LAB — BASIC METABOLIC PANEL
BUN/Creatinine Ratio: 13 (ref 11–26)
BUN: 11 mg/dL (ref 8–27)
CO2: 24 mmol/L (ref 18–29)
Calcium: 9.4 mg/dL (ref 8.7–10.3)
Chloride: 102 mmol/L (ref 97–106)
Creatinine, Ser: 0.84 mg/dL (ref 0.57–1.00)
GFR calc Af Amer: 83 mL/min/{1.73_m2} (ref >59)
GFR calc non Af Amer: 72 mL/min/{1.73_m2} (ref >59)
Glucose: 79 mg/dL (ref 65–99)
Potassium: 4.4 mmol/L (ref 3.5–5.2)
Sodium: 140 mmol/L (ref 136–144)

## 2015-12-22 LAB — FECAL OCCULT BLOOD, GUAIAC: Fecal Occult Blood: NEGATIVE

## 2015-12-30 ENCOUNTER — Other Ambulatory Visit: Payer: Self-pay | Admitting: Family Medicine

## 2016-02-09 ENCOUNTER — Encounter: Payer: Medicare Other | Admitting: Family Medicine

## 2016-02-10 ENCOUNTER — Other Ambulatory Visit: Payer: Self-pay | Admitting: Family Medicine

## 2016-02-10 NOTE — Telephone Encounter (Signed)
Your patient 

## 2016-03-14 ENCOUNTER — Encounter: Payer: Self-pay | Admitting: Family Medicine

## 2016-03-14 ENCOUNTER — Ambulatory Visit (INDEPENDENT_AMBULATORY_CARE_PROVIDER_SITE_OTHER): Payer: Medicare Other | Admitting: Family Medicine

## 2016-03-14 VITALS — BP 104/66 | HR 68 | Temp 97.7°F | Ht 61.0 in | Wt 203.0 lb

## 2016-03-14 DIAGNOSIS — Z13820 Encounter for screening for osteoporosis: Secondary | ICD-10-CM

## 2016-03-14 DIAGNOSIS — Z23 Encounter for immunization: Secondary | ICD-10-CM

## 2016-03-14 DIAGNOSIS — Z0000 Encounter for general adult medical examination without abnormal findings: Secondary | ICD-10-CM | POA: Diagnosis not present

## 2016-03-14 DIAGNOSIS — E119 Type 2 diabetes mellitus without complications: Secondary | ICD-10-CM | POA: Diagnosis not present

## 2016-03-14 DIAGNOSIS — Z1231 Encounter for screening mammogram for malignant neoplasm of breast: Secondary | ICD-10-CM

## 2016-03-14 DIAGNOSIS — I10 Essential (primary) hypertension: Secondary | ICD-10-CM | POA: Diagnosis not present

## 2016-03-14 DIAGNOSIS — E785 Hyperlipidemia, unspecified: Secondary | ICD-10-CM

## 2016-03-14 DIAGNOSIS — Z113 Encounter for screening for infections with a predominantly sexual mode of transmission: Secondary | ICD-10-CM | POA: Diagnosis not present

## 2016-03-14 DIAGNOSIS — S61401A Unspecified open wound of right hand, initial encounter: Secondary | ICD-10-CM | POA: Diagnosis not present

## 2016-03-14 LAB — URINALYSIS, ROUTINE W REFLEX MICROSCOPIC
Bilirubin, UA: NEGATIVE
Glucose, UA: NEGATIVE
Ketones, UA: NEGATIVE
Nitrite, UA: NEGATIVE
Protein, UA: NEGATIVE
RBC, UA: NEGATIVE
Specific Gravity, UA: 1.01 (ref 1.005–1.030)
Urobilinogen, Ur: 0.2 mg/dL (ref 0.2–1.0)
pH, UA: 5 (ref 5.0–7.5)

## 2016-03-14 LAB — MICROSCOPIC EXAMINATION

## 2016-03-14 LAB — BAYER DCA HB A1C WAIVED: HB A1C (BAYER DCA - WAIVED): 9.4 % — ABNORMAL HIGH (ref <7.0)

## 2016-03-14 MED ORDER — AMLODIPINE BESYLATE 10 MG PO TABS: 10.0000 mg | ORAL_TABLET | Freq: Every day | ORAL | Status: DC

## 2016-03-14 MED ORDER — INSULIN DETEMIR 100 UNIT/ML FLEXPEN: 60.0000 [IU] | PEN_INJECTOR | Freq: Every day | SUBCUTANEOUS | Status: DC

## 2016-03-14 NOTE — Assessment & Plan Note (Signed)
The current medical regimen is effective;  continue present plan and medications.  

## 2016-03-14 NOTE — Progress Notes (Signed)
BP 104/66 mmHg  Pulse 68  Temp(Src) 97.7 F (36.5 C)  Ht 5\' 1"  (1.549 m)  Wt 203 lb (92.08 kg)  BMI 38.38 kg/m2  SpO2 98%   Subjective:    [name], female    DOB: [birth year], 70 y.o.   MRN: [id number]  HPI: Tami Cummings is a 70 y.o. female  Chief Complaint  Patient presents with  . Annual Exam   Patient with dog bite wound to right hand healing well but needs tetanus shot bite was one week ago Diabetes noted low blood sugar spells no issues Blood pressure good control no complaints or concerns with medications Arthritis stable Cholesterol stable no complaints with medications Patient decided not to go back to endocrinology for diabetes management Relevant past medical, surgical, family and social history reviewed and updated as indicated. Interim medical history since our last visit reviewed. Allergies and medications reviewed and updated.  Review of Systems  Constitutional: Negative.   HENT: Negative.   Eyes: Negative.   Respiratory: Negative.   Cardiovascular: Negative.   Gastrointestinal: Negative.   Endocrine: Negative.   Genitourinary: Negative.   Musculoskeletal: Negative.   Skin: Negative.   Allergic/Immunologic: Negative.   Neurological: Negative.   Hematological: Negative.   Psychiatric/Behavioral: Negative.     Per HPI unless specifically indicated above     Objective:    BP 104/66 mmHg  Pulse 68  Temp(Src) 97.7 F (36.5 C)  Ht 5\' 1"  (1.549 m)  Wt 203 lb (92.08 kg)  BMI 38.38 kg/m2  SpO2 98%  Wt Readings from Last 3 Encounters:  03/14/16 203 lb (92.08 kg)  11/22/15 206 lb (93.441 kg)  10/25/15 207 lb (93.895 kg)    Physical Exam  Constitutional: She is oriented to person, place, and time. She appears well-developed and well-nourished.  HENT:  Head: Normocephalic and atraumatic.  Right Ear: External ear normal.  Left Ear: External ear normal.  Nose: Nose normal.  Mouth/Throat: Oropharynx is clear and  moist.  Eyes: Conjunctivae and EOM are normal. Pupils are equal, round, and reactive to light.  Neck: Normal range of motion. Neck supple. Carotid bruit is not present.  Cardiovascular: Normal rate, regular rhythm and normal heart sounds.   No murmur heard. Pulmonary/Chest: Effort normal and breath sounds normal. She exhibits no mass. Right breast exhibits no mass, no skin change and no tenderness. Left breast exhibits no mass, no skin change and no tenderness. Breasts are symmetrical.  Abdominal: Soft. Bowel sounds are normal. There is no hepatosplenomegaly.  Musculoskeletal: Normal range of motion.  Neurological: She is alert and oriented to person, place, and time.  Skin: No rash noted.  Psychiatric: She has a normal mood and affect. Her behavior is normal. Judgment and thought content normal.    Results for orders placed or performed in visit on 03/14/16  Fecal Occult Blood, Guaiac  Result Value Ref Range   Fecal Occult Blood Negative       Assessment & Plan:   Problem List Items Addressed This Visit      Cardiovascular and Mediastinum   Hypertension    The current medical regimen is effective;  continue present plan and medications.       Relevant Medications   amLODipine (NORVASC) 10 MG tablet     Endocrine   Diabetes mellitus without complication ([place])    Diabetes with continued terrible care noncompliance with medications and lack of interest in doing better. Patient decided not to go back to  endocrinology for now reviewed importance again of doing a better job and need for self-care discussed depression also      Relevant Medications   Insulin Detemir (LEVEMIR FLEXTOUCH) 100 UNIT/ML Pen   Other Relevant Orders   Bayer DCA Hb A1c Waived   Comprehensive metabolic panel   Lipid panel   CBC with Differential/Platelet   TSH   Urinalysis, Routine w reflex microscopic (not at [facility])     Other   Hyperlipidemia    The current medical regimen is effective;  continue  present plan and medications.       Relevant Medications   amLODipine (NORVASC) 10 MG tablet   Other Relevant Orders   Lipid panel    Other Visit Diagnoses    Routine screening for STI (sexually transmitted infection)    -  Primary    Relevant Orders    Hepatitis C Antibody    Encounter for screening mammogram for breast cancer        Relevant Orders    MM Digital Screening    Encounter for screening for osteoporosis        Relevant Orders    DG Bone Density    Wound, open, hand with or without fingers with complication, right, initial encounter        healing but needs dT    PE (physical exam), annual        Essential hypertension, benign        Relevant Medications    amLODipine (NORVASC) 10 MG tablet    Insulin Detemir (LEVEMIR FLEXTOUCH) 100 UNIT/ML Pen    Other Relevant Orders    Comprehensive metabolic panel    Lipid panel    CBC with Differential/Platelet    TSH    Urinalysis, Routine w reflex microscopic (not at [facility])        Follow up plan: Return in about 3 months (around 06/14/2016) for Hemoglobin A1c.

## 2016-03-14 NOTE — Addendum Note (Signed)
Addended by: Wynn Maudlin on: 03/14/2016 03:03 PM   Modules accepted: Orders, SmartSet

## 2016-03-14 NOTE — Assessment & Plan Note (Signed)
Diabetes with continued terrible care noncompliance with medications and lack of interest in doing better. Patient decided not to go back to endocrinology for now reviewed importance again of doing a better job and need for self-care discussed depression also

## 2016-03-15 ENCOUNTER — Encounter: Payer: Self-pay | Admitting: Family Medicine

## 2016-03-15 LAB — CBC WITH DIFFERENTIAL/PLATELET
Basophils Absolute: 0.1 10*3/uL (ref 0.0–0.2)
Basos: 1 %
EOS (ABSOLUTE): 0.2 10*3/uL (ref 0.0–0.4)
Eos: 2 %
Hematocrit: 41.4 % (ref 34.0–46.6)
Hemoglobin: 13.7 g/dL (ref 11.1–15.9)
Immature Grans (Abs): 0 10*3/uL (ref 0.0–0.1)
Immature Granulocytes: 0 %
Lymphocytes Absolute: 2.6 10*3/uL (ref 0.7–3.1)
Lymphs: 28 %
MCH: 27 pg (ref 26.6–33.0)
MCHC: 33.1 g/dL (ref 31.5–35.7)
MCV: 82 fL (ref 79–97)
Monocytes Absolute: 0.7 10*3/uL (ref 0.1–0.9)
Monocytes: 7 %
Neutrophils Absolute: 5.8 10*3/uL (ref 1.4–7.0)
Neutrophils: 62 %
Platelets: 216 10*3/uL (ref 150–379)
RBC: 5.07 x10E6/uL (ref 3.77–5.28)
RDW: 14.2 % (ref 12.3–15.4)
WBC: 9.2 10*3/uL (ref 3.4–10.8)

## 2016-03-15 LAB — LIPID PANEL
Chol/HDL Ratio: 4.1 ratio units (ref 0.0–4.4)
Cholesterol, Total: 148 mg/dL (ref 100–199)
HDL: 36 mg/dL — ABNORMAL LOW (ref >39)
LDL Calculated: 83 mg/dL (ref 0–99)
Triglycerides: 145 mg/dL (ref 0–149)
VLDL Cholesterol Cal: 29 mg/dL (ref 5–40)

## 2016-03-15 LAB — COMPREHENSIVE METABOLIC PANEL
ALT: 16 IU/L (ref 0–32)
AST: 16 IU/L (ref 0–40)
Albumin/Globulin Ratio: 1.5 (ref 1.2–2.2)
Albumin: 4 g/dL (ref 3.6–4.8)
Alkaline Phosphatase: 79 IU/L (ref 39–117)
BUN/Creatinine Ratio: 15 (ref 11–26)
BUN: 12 mg/dL (ref 8–27)
Bilirubin Total: 0.3 mg/dL (ref 0.0–1.2)
CO2: 24 mmol/L (ref 18–29)
Calcium: 9.5 mg/dL (ref 8.7–10.3)
Chloride: 98 mmol/L (ref 96–106)
Creatinine, Ser: 0.79 mg/dL (ref 0.57–1.00)
GFR calc Af Amer: 89 mL/min/{1.73_m2} (ref >59)
GFR calc non Af Amer: 77 mL/min/{1.73_m2} (ref >59)
Globulin, Total: 2.6 g/dL (ref 1.5–4.5)
Glucose: 92 mg/dL (ref 65–99)
Potassium: 4.5 mmol/L (ref 3.5–5.2)
Sodium: 139 mmol/L (ref 134–144)
Total Protein: 6.6 g/dL (ref 6.0–8.5)

## 2016-03-15 LAB — HEPATITIS C ANTIBODY: Hep C Virus Ab: 0.1 s/co ratio (ref 0.0–0.9)

## 2016-03-15 LAB — TSH: TSH: 2.47 u[IU]/mL (ref 0.450–4.500)

## 2016-03-21 ENCOUNTER — Other Ambulatory Visit: Payer: Medicare Other

## 2016-03-21 ENCOUNTER — Ambulatory Visit: Payer: Medicare Other | Attending: Family Medicine

## 2016-03-27 ENCOUNTER — Encounter: Payer: Self-pay | Admitting: Family Medicine

## 2016-03-27 ENCOUNTER — Ambulatory Visit
Admission: RE | Admit: 2016-03-27 | Discharge: 2016-03-27 | Disposition: A | Discharge status: Home / Self Care | Payer: Medicare Other | Source: Ambulatory Visit | Attending: Family Medicine | Admitting: Family Medicine

## 2016-03-27 DIAGNOSIS — Z13820 Encounter for screening for osteoporosis: Secondary | ICD-10-CM | POA: Insufficient documentation

## 2016-03-27 DIAGNOSIS — Z794 Long term (current) use of insulin: Secondary | ICD-10-CM | POA: Insufficient documentation

## 2016-03-27 DIAGNOSIS — Z1231 Encounter for screening mammogram for malignant neoplasm of breast: Secondary | ICD-10-CM | POA: Insufficient documentation

## 2016-03-27 DIAGNOSIS — M8580 Other specified disorders of bone density and structure, unspecified site: Secondary | ICD-10-CM | POA: Diagnosis not present

## 2016-03-27 DIAGNOSIS — R921 Mammographic calcification found on diagnostic imaging of breast: Secondary | ICD-10-CM | POA: Insufficient documentation

## 2016-03-27 DIAGNOSIS — Z780 Asymptomatic menopausal state: Secondary | ICD-10-CM | POA: Insufficient documentation

## 2016-03-27 DIAGNOSIS — M85852 Other specified disorders of bone density and structure, left thigh: Secondary | ICD-10-CM | POA: Diagnosis not present

## 2016-03-29 ENCOUNTER — Other Ambulatory Visit: Payer: Self-pay | Admitting: Family Medicine

## 2016-03-29 DIAGNOSIS — R928 Other abnormal and inconclusive findings on diagnostic imaging of breast: Secondary | ICD-10-CM

## 2016-04-07 ENCOUNTER — Ambulatory Visit
Admission: RE | Admit: 2016-04-07 | Discharge: 2016-04-07 | Disposition: A | Discharge status: Home / Self Care | Payer: Medicare Other | Source: Ambulatory Visit | Attending: Family Medicine | Admitting: Family Medicine

## 2016-04-07 ENCOUNTER — Other Ambulatory Visit: Payer: Self-pay | Admitting: Family Medicine

## 2016-04-07 DIAGNOSIS — R921 Mammographic calcification found on diagnostic imaging of breast: Secondary | ICD-10-CM | POA: Diagnosis not present

## 2016-04-07 DIAGNOSIS — R928 Other abnormal and inconclusive findings on diagnostic imaging of breast: Secondary | ICD-10-CM

## 2016-04-12 ENCOUNTER — Ambulatory Visit
Admission: RE | Admit: 2016-04-12 | Discharge: 2016-04-12 | Disposition: A | Discharge status: Home / Self Care | Payer: Medicare Other | Source: Ambulatory Visit | Attending: Family Medicine | Admitting: Family Medicine

## 2016-04-12 DIAGNOSIS — N6031 Fibrosclerosis of right breast: Secondary | ICD-10-CM | POA: Diagnosis not present

## 2016-04-12 DIAGNOSIS — R928 Other abnormal and inconclusive findings on diagnostic imaging of breast: Secondary | ICD-10-CM

## 2016-04-12 DIAGNOSIS — R921 Mammographic calcification found on diagnostic imaging of breast: Secondary | ICD-10-CM | POA: Insufficient documentation

## 2016-04-12 DIAGNOSIS — N920 Excessive and frequent menstruation with regular cycle: Secondary | ICD-10-CM | POA: Diagnosis not present

## 2016-04-12 DIAGNOSIS — D241 Benign neoplasm of right breast: Secondary | ICD-10-CM | POA: Insufficient documentation

## 2016-04-12 HISTORY — PX: BREAST BIOPSY: SHX20

## 2016-04-13 LAB — SURGICAL PATHOLOGY

## 2016-05-02 DIAGNOSIS — E119 Type 2 diabetes mellitus without complications: Secondary | ICD-10-CM | POA: Diagnosis not present

## 2016-05-02 LAB — HM DIABETES EYE EXAM

## 2016-05-10 ENCOUNTER — Other Ambulatory Visit: Payer: Self-pay | Admitting: Family Medicine

## 2016-05-12 ENCOUNTER — Other Ambulatory Visit: Payer: Self-pay | Admitting: Family Medicine

## 2016-05-16 ENCOUNTER — Telehealth: Payer: Self-pay | Admitting: Family Medicine

## 2016-05-16 NOTE — Telephone Encounter (Signed)
Done  Pt called stated she needs a call back from [place]. Stated Dr. Jeananne Rama has not sent in a new RX for her Amlodipine. Pharm is [facility] in [place]. Pt needs a 90 day supply. Thanks.

## 2016-05-16 NOTE — Telephone Encounter (Signed)
Pt called stated she needs a call back from [place]. Stated Dr. Jeananne Rama has not sent in a new RX for her Amlodipine. Pharm is [facility] in [place]. Pt needs a 90 day supply. Thanks.

## 2016-07-12 ENCOUNTER — Ambulatory Visit (INDEPENDENT_AMBULATORY_CARE_PROVIDER_SITE_OTHER): Payer: Medicare Other | Admitting: Family Medicine

## 2016-07-12 ENCOUNTER — Encounter: Payer: Self-pay | Admitting: Family Medicine

## 2016-07-12 VITALS — BP 127/74 | HR 66 | Temp 97.2°F | Ht 61.3 in | Wt 200.0 lb

## 2016-07-12 DIAGNOSIS — E119 Type 2 diabetes mellitus without complications: Secondary | ICD-10-CM

## 2016-07-12 DIAGNOSIS — E785 Hyperlipidemia, unspecified: Secondary | ICD-10-CM

## 2016-07-12 DIAGNOSIS — I10 Essential (primary) hypertension: Secondary | ICD-10-CM | POA: Diagnosis not present

## 2016-07-12 LAB — BAYER DCA HB A1C WAIVED: HB A1C (BAYER DCA - WAIVED): 7.9 % — ABNORMAL HIGH (ref <7.0)

## 2016-07-12 NOTE — Assessment & Plan Note (Signed)
The current medical regimen is effective;  continue present plan and medications.  

## 2016-07-12 NOTE — Progress Notes (Signed)
   BP 127/74 (BP Location: Left Arm, Patient Position: Sitting, Cuff Size: Normal)   Pulse 66   Temp 97.2 F (36.2 C)   Ht 5' 1.3" (1.557 m)   Wt 200 lb (90.7 kg)   SpO2 99%   BMI 37.42 kg/m    Subjective:    [name], female    DOB: 1946/05/18, 70 y.o.   MRN: [id number]  HPI: Illianna Cummings is a 70 y.o. female  Chief Complaint  Patient presents with  . Diabetes  Patient follow-up diabetes doing much better with diet exercise nutrition lost 4 pounds trying not to eat sweets and doing better with her dosing. Patient's hemoglobin A1c the best it's been in over a year. No blood sugar spells.  Hypertension doing well increased clonidine does cause headaches patient discontinued clonidine blood pressures doing good with weight loss and blood pressures doing well off clonidine and headaches have resolved.  Cholesterol medicine no complaints or issues  Relevant past medical, surgical, family and social history reviewed and updated as indicated. Interim medical history since our last visit reviewed. Allergies and medications reviewed and updated.  Review of Systems  Per HPI unless specifically indicated above     Objective:    BP 127/74 (BP Location: Left Arm, Patient Position: Sitting, Cuff Size: Normal)   Pulse 66   Temp 97.2 F (36.2 C)   Ht 5' 1.3" (1.557 m)   Wt 200 lb (90.7 kg)   SpO2 99%   BMI 37.42 kg/m   Wt Readings from Last 3 Encounters:  07/12/16 200 lb (90.7 kg)  03/14/16 203 lb (92.1 kg)  11/22/15 206 lb (93.4 kg)    Physical Exam  Results for orders placed or performed in visit on 05/05/16  HM DIABETES EYE EXAM  Result Value Ref Range   HM Diabetic Eye Exam No Retinopathy No Retinopathy      Assessment & Plan:   Problem List Items Addressed This Visit      Cardiovascular and Mediastinum   Hypertension    The current medical regimen is effective;  continue present plan and medications.         Endocrine   Diabetes mellitus without complication ([place]) - Primary    Doing the best her diabetes is done in over a year will continue current care and diet exercise nutrition.      Relevant Orders   Bayer DCA Hb A1c Waived     Other   Hyperlipidemia    The current medical regimen is effective;  continue present plan and medications.        Other Visit Diagnoses   None.      Follow up plan: Return in about 3 months (around 10/12/2016), or if symptoms worsen or fail to improve, for A1c, BMP, lipids, ALT, AST.

## 2016-07-12 NOTE — Assessment & Plan Note (Signed)
Doing the best her diabetes is done in over a year will continue current care and diet exercise nutrition.

## 2016-07-13 LAB — HEMOGLOBIN A1C: Hemoglobin A1C: 7.9

## 2016-07-28 ENCOUNTER — Other Ambulatory Visit: Payer: Self-pay | Admitting: Family Medicine

## 2016-07-28 DIAGNOSIS — E119 Type 2 diabetes mellitus without complications: Secondary | ICD-10-CM

## 2016-08-17 ENCOUNTER — Other Ambulatory Visit: Payer: Self-pay | Admitting: Family Medicine

## 2016-08-17 DIAGNOSIS — E785 Hyperlipidemia, unspecified: Secondary | ICD-10-CM

## 2016-08-17 DIAGNOSIS — I10 Essential (primary) hypertension: Secondary | ICD-10-CM

## 2016-08-29 ENCOUNTER — Other Ambulatory Visit: Payer: Self-pay | Admitting: Family Medicine

## 2016-08-29 DIAGNOSIS — E785 Hyperlipidemia, unspecified: Secondary | ICD-10-CM

## 2016-09-01 ENCOUNTER — Encounter: Payer: Self-pay | Admitting: Family Medicine

## 2016-09-01 ENCOUNTER — Other Ambulatory Visit: Payer: Self-pay | Admitting: Family Medicine

## 2016-09-01 DIAGNOSIS — I10 Essential (primary) hypertension: Secondary | ICD-10-CM

## 2016-09-04 ENCOUNTER — Other Ambulatory Visit: Payer: Self-pay | Admitting: Family Medicine

## 2016-09-04 ENCOUNTER — Encounter: Payer: Self-pay | Admitting: Family Medicine

## 2016-09-04 DIAGNOSIS — I10 Essential (primary) hypertension: Secondary | ICD-10-CM

## 2016-09-04 NOTE — Telephone Encounter (Signed)
Appointment

## 2016-09-04 NOTE — Telephone Encounter (Signed)
Letter sent.

## 2016-09-29 DIAGNOSIS — H04123 Dry eye syndrome of bilateral lacrimal glands: Secondary | ICD-10-CM | POA: Diagnosis not present

## 2016-10-12 ENCOUNTER — Ambulatory Visit: Payer: Medicare Other | Admitting: Family Medicine

## 2016-10-30 ENCOUNTER — Ambulatory Visit (INDEPENDENT_AMBULATORY_CARE_PROVIDER_SITE_OTHER): Payer: Medicare Other | Admitting: Family Medicine

## 2016-10-30 ENCOUNTER — Encounter: Payer: Self-pay | Admitting: Family Medicine

## 2016-10-30 VITALS — BP 145/68 | HR 73 | Temp 97.9°F | Wt 202.0 lb

## 2016-10-30 DIAGNOSIS — E782 Mixed hyperlipidemia: Secondary | ICD-10-CM | POA: Diagnosis not present

## 2016-10-30 DIAGNOSIS — Z23 Encounter for immunization: Secondary | ICD-10-CM | POA: Diagnosis not present

## 2016-10-30 DIAGNOSIS — Z9119 Patient's noncompliance with other medical treatment and regimen: Secondary | ICD-10-CM

## 2016-10-30 DIAGNOSIS — Z91199 Patient's noncompliance with other medical treatment and regimen due to unspecified reason: Secondary | ICD-10-CM

## 2016-10-30 DIAGNOSIS — I10 Essential (primary) hypertension: Secondary | ICD-10-CM

## 2016-10-30 DIAGNOSIS — E119 Type 2 diabetes mellitus without complications: Secondary | ICD-10-CM

## 2016-10-30 LAB — LP+ALT+AST PICCOLO, WAIVED
ALT (SGPT) Piccolo, Waived: 21 U/L (ref 10–47)
AST (SGOT) Piccolo, Waived: 26 U/L (ref 11–38)
Chol/HDL Ratio Piccolo,Waive: 3.6 mg/dL
Cholesterol Piccolo, Waived: 160 mg/dL (ref <200)
HDL Chol Piccolo, Waived: 45 mg/dL — ABNORMAL LOW (ref >59)
LDL Chol Calc Piccolo Waived: 77 mg/dL (ref <100)
Triglycerides Piccolo,Waived: 194 mg/dL — ABNORMAL HIGH (ref <150)
VLDL Chol Calc Piccolo,Waive: 39 mg/dL — ABNORMAL HIGH (ref <30)

## 2016-10-30 LAB — BAYER DCA HB A1C WAIVED: HB A1C (BAYER DCA - WAIVED): 8.4 % — ABNORMAL HIGH (ref <7.0)

## 2016-10-30 LAB — MICROALBUMIN, URINE WAIVED
Creatinine, Urine Waived: 100 mg/dL (ref 10–300)
Microalb, Ur Waived: 150 mg/L — ABNORMAL HIGH (ref 0–19)

## 2016-10-30 MED ORDER — ATENOLOL 100 MG PO TABS: 100.0000 mg | ORAL_TABLET | Freq: Every day | ORAL | 1 refills | Status: DC

## 2016-10-30 MED ORDER — AMLODIPINE BESYLATE 10 MG PO TABS: 10.0000 mg | ORAL_TABLET | Freq: Every day | ORAL | 1 refills | Status: DC

## 2016-10-30 MED ORDER — SPIRONOLACTONE 25 MG PO TABS: 25.0000 mg | ORAL_TABLET | Freq: Every day | ORAL | 1 refills | Status: DC

## 2016-10-30 NOTE — Progress Notes (Signed)
BP (!) 145/68 (BP Location: Left Arm)   Pulse 73   Temp 97.9 F (36.6 C)   Wt 202 lb (91.6 kg)   SpO2 98%   BMI 37.79 kg/m    Subjective:    [name], female    DOB: 01-01-1946, 70 y.o.   MRN: [id number]  HPI: Tami Cummings is a 70 y.o. female  Chief Complaint  Patient presents with  . Diabetes  . Hypertension  . Hyperlipidemia   Patient with chronic poor compliance with insulin insulin dosage and diet. Patient with a lot of home stress which is reviewed with patient Basically patient's daughter his freeloading and living in her house. Consequently a lot of financial stress patient states she is taking her medications Insulin use has been inconsistent. Reviewed patient's previously been to [facility] clinic endocrinology to see if helps manage and diabetes. Patient didn't go back because she would have to start taking her medicines faithfully to make any difference and know she is not doing that regularly. Blood pressures little better patient mostly takes her medications. No side effects Reviewed depression with patient patient refusing any treatment for depression and nerves stating her daughter just needs to move out.  Relevant past medical, surgical, family and social history reviewed and updated as indicated. Interim medical history since our last visit reviewed. Allergies and medications reviewed and updated.  Review of Systems  Constitutional: Negative.   Respiratory: Negative.   Cardiovascular: Negative.     Per HPI unless specifically indicated above     Objective:    BP (!) 145/68 (BP Location: Left Arm)   Pulse 73   Temp 97.9 F (36.6 C)   Wt 202 lb (91.6 kg)   SpO2 98%   BMI 37.79 kg/m   Wt Readings from Last 3 Encounters:  10/30/16 202 lb (91.6 kg)  07/12/16 200 lb (90.7 kg)  03/14/16 203 lb (92.1 kg)    Physical Exam  Constitutional: She is oriented to person, place, and time. She appears well-developed and  well-nourished. No distress.  HENT:  Head: Normocephalic and atraumatic.  Right Ear: Hearing normal.  Left Ear: Hearing normal.  Nose: Nose normal.  Eyes: Conjunctivae and lids are normal. Right eye exhibits no discharge. Left eye exhibits no discharge. No scleral icterus.  Cardiovascular: Normal rate, regular rhythm and normal heart sounds.   Pulmonary/Chest: Effort normal and breath sounds normal. No respiratory distress.  Musculoskeletal: Normal range of motion.  Neurological: She is alert and oriented to person, place, and time.  Skin: Skin is intact. No rash noted.  Psychiatric: She has a normal mood and affect. Her speech is normal and behavior is normal. Judgment and thought content normal. Cognition and memory are normal.    Results for orders placed or performed in visit on 07/19/16  Hemoglobin A1c  Result Value Ref Range   Hemoglobin A1C 7.9       Assessment & Plan:   Problem List Items Addressed This Visit      Cardiovascular and Mediastinum   Hypertension    Poor control and poor compliance Discussed taking medications diet exercise nutrition.      Relevant Medications   amLODipine (NORVASC) 10 MG tablet   atenolol (TENORMIN) 100 MG tablet   spironolactone (ALDACTONE) 25 MG tablet   Other Relevant Orders   Basic metabolic panel     Endocrine   Diabetes mellitus without complication ([place]) - Primary    Poor control poor compliance patient aware  of how to treat diabetes and proper diet just needs to execute.      Relevant Orders   Bayer DCA Hb A1c Waived   Microalbumin, Urine Waived     Other   Hyperlipidemia    The current medical regimen is effective;  continue present plan and medications.       Relevant Medications   amLODipine (NORVASC) 10 MG tablet   atenolol (TENORMIN) 100 MG tablet   spironolactone (ALDACTONE) 25 MG tablet   Other Relevant Orders   LP+ALT+AST Piccolo, Waived   Noncompliance    Reviewed medical issues consequences and  barriers to compliance       Other Visit Diagnoses    Needs flu shot       Encounter for immunization       Relevant Orders   Flu vaccine HIGH DOSE PF (Completed)   Essential hypertension, benign       Relevant Medications   amLODipine (NORVASC) 10 MG tablet   atenolol (TENORMIN) 100 MG tablet   spironolactone (ALDACTONE) 25 MG tablet       Follow up plan: Return in about 3 months (around 01/30/2017) for Hemoglobin A1c, Physical Exam.

## 2016-10-30 NOTE — Assessment & Plan Note (Signed)
Poor control and poor compliance Discussed taking medications diet exercise nutrition.

## 2016-10-30 NOTE — Assessment & Plan Note (Signed)
Reviewed medical issues consequences and barriers to compliance

## 2016-10-30 NOTE — Assessment & Plan Note (Signed)
The current medical regimen is effective;  continue present plan and medications.  

## 2016-10-30 NOTE — Assessment & Plan Note (Signed)
Poor control poor compliance patient aware of how to treat diabetes and proper diet just needs to execute.

## 2016-10-31 ENCOUNTER — Other Ambulatory Visit: Payer: Self-pay | Admitting: Family Medicine

## 2016-10-31 ENCOUNTER — Encounter: Payer: Self-pay | Admitting: Family Medicine

## 2016-10-31 DIAGNOSIS — E785 Hyperlipidemia, unspecified: Secondary | ICD-10-CM

## 2016-10-31 LAB — BASIC METABOLIC PANEL
BUN/Creatinine Ratio: 11 — ABNORMAL LOW (ref 12–28)
BUN: 9 mg/dL (ref 8–27)
CO2: 25 mmol/L (ref 18–29)
Calcium: 9 mg/dL (ref 8.7–10.3)
Chloride: 100 mmol/L (ref 96–106)
Creatinine, Ser: 0.81 mg/dL (ref 0.57–1.00)
GFR calc Af Amer: 86 mL/min/{1.73_m2} (ref >59)
GFR calc non Af Amer: 74 mL/min/{1.73_m2} (ref >59)
Glucose: 126 mg/dL — ABNORMAL HIGH (ref 65–99)
Potassium: 4 mmol/L (ref 3.5–5.2)
Sodium: 143 mmol/L (ref 134–144)

## 2016-11-01 ENCOUNTER — Telehealth: Payer: Self-pay

## 2016-11-01 NOTE — Telephone Encounter (Signed)
Fax from [employer].  Unable to get Novofine needles.  Needs authorization and wants to know if they can substitute it for another 32gx38mm needle.

## 2016-11-01 NOTE — Telephone Encounter (Signed)
Faxed back to pharmacy

## 2016-11-01 NOTE — Telephone Encounter (Signed)
yes

## 2017-03-12 ENCOUNTER — Other Ambulatory Visit: Payer: Self-pay | Admitting: Family Medicine

## 2017-03-12 NOTE — Telephone Encounter (Signed)
Last OV: 10/30/16 Next OV: 04/10/17   Lab Results  Component Value Date   HGBA1C 7.9 07/13/2016

## 2017-03-13 ENCOUNTER — Other Ambulatory Visit: Payer: Self-pay | Admitting: Family Medicine

## 2017-03-13 DIAGNOSIS — Z1231 Encounter for screening mammogram for malignant neoplasm of breast: Secondary | ICD-10-CM

## 2017-03-15 ENCOUNTER — Encounter: Payer: Medicare Other | Admitting: Family Medicine

## 2017-03-28 ENCOUNTER — Ambulatory Visit
Admission: RE | Admit: 2017-03-28 | Discharge: 2017-03-28 | Disposition: A | Discharge status: Home / Self Care | Payer: Medicare Other | Source: Ambulatory Visit | Attending: Family Medicine | Admitting: Family Medicine

## 2017-03-28 DIAGNOSIS — Z1231 Encounter for screening mammogram for malignant neoplasm of breast: Secondary | ICD-10-CM | POA: Insufficient documentation

## 2017-04-10 ENCOUNTER — Ambulatory Visit (INDEPENDENT_AMBULATORY_CARE_PROVIDER_SITE_OTHER): Payer: Medicare Other | Admitting: Family Medicine

## 2017-04-10 ENCOUNTER — Encounter: Payer: Self-pay | Admitting: Family Medicine

## 2017-04-10 VITALS — BP 125/79 | HR 72 | Ht 61.02 in | Wt 209.0 lb

## 2017-04-10 DIAGNOSIS — Z7189 Other specified counseling: Secondary | ICD-10-CM | POA: Diagnosis not present

## 2017-04-10 DIAGNOSIS — E782 Mixed hyperlipidemia: Secondary | ICD-10-CM

## 2017-04-10 DIAGNOSIS — Z6839 Body mass index (BMI) 39.0-39.9, adult: Secondary | ICD-10-CM

## 2017-04-10 DIAGNOSIS — I10 Essential (primary) hypertension: Secondary | ICD-10-CM

## 2017-04-10 DIAGNOSIS — Z0000 Encounter for general adult medical examination without abnormal findings: Secondary | ICD-10-CM | POA: Diagnosis not present

## 2017-04-10 DIAGNOSIS — E119 Type 2 diabetes mellitus without complications: Secondary | ICD-10-CM | POA: Diagnosis not present

## 2017-04-10 DIAGNOSIS — Z13220 Encounter for screening for lipoid disorders: Secondary | ICD-10-CM

## 2017-04-10 DIAGNOSIS — D692 Other nonthrombocytopenic purpura: Secondary | ICD-10-CM

## 2017-04-10 DIAGNOSIS — E7800 Pure hypercholesterolemia, unspecified: Secondary | ICD-10-CM

## 2017-04-10 DIAGNOSIS — Z1329 Encounter for screening for other suspected endocrine disorder: Secondary | ICD-10-CM | POA: Diagnosis not present

## 2017-04-10 LAB — URINALYSIS, ROUTINE W REFLEX MICROSCOPIC
Bilirubin, UA: NEGATIVE
Glucose, UA: NEGATIVE
Ketones, UA: NEGATIVE
Nitrite, UA: NEGATIVE
Protein, UA: NEGATIVE
Specific Gravity, UA: <1.005 — ABNORMAL LOW (ref 1.005–1.030)
Urobilinogen, Ur: 0.2 mg/dL (ref 0.2–1.0)
pH, UA: 5.5 (ref 5.0–7.5)

## 2017-04-10 LAB — MICROSCOPIC EXAMINATION

## 2017-04-10 LAB — BAYER DCA HB A1C WAIVED: HB A1C (BAYER DCA - WAIVED): 8.3 % — ABNORMAL HIGH (ref <7.0)

## 2017-04-10 MED ORDER — SAXAGLIPTIN HCL 5 MG PO TABS: 5.0000 mg | ORAL_TABLET | Freq: Every day | ORAL | 4 refills | Status: DC

## 2017-04-10 MED ORDER — INSULIN LISPRO 100 UNIT/ML (KWIKPEN): 25.0000 [IU] | PEN_INJECTOR | Freq: Two times a day (BID) | SUBCUTANEOUS | 12 refills | Status: DC

## 2017-04-10 MED ORDER — ATENOLOL 100 MG PO TABS: 100.0000 mg | ORAL_TABLET | Freq: Every day | ORAL | 4 refills | Status: DC

## 2017-04-10 MED ORDER — INSULIN DETEMIR 100 UNIT/ML FLEXPEN: 60.0000 [IU] | PEN_INJECTOR | Freq: Every day | SUBCUTANEOUS | 4 refills | Status: DC

## 2017-04-10 MED ORDER — LOVASTATIN 40 MG PO TABS: 40.0000 mg | ORAL_TABLET | Freq: Every day | ORAL | 4 refills | Status: DC

## 2017-04-10 MED ORDER — AMLODIPINE BESYLATE 10 MG PO TABS
10.0000 mg | ORAL_TABLET | Freq: Every day | ORAL | 4 refills | Status: DC
Start: 2017-04-10 — End: 2018-05-30

## 2017-04-10 MED ORDER — SPIRONOLACTONE 25 MG PO TABS: 25.0000 mg | ORAL_TABLET | Freq: Every day | ORAL | 4 refills | Status: DC

## 2017-04-10 NOTE — Assessment & Plan Note (Signed)
The current medical regimen is effective;  continue present plan and medications.  

## 2017-04-10 NOTE — Assessment & Plan Note (Signed)
Discussed diet/exercise/weight loss.  

## 2017-04-10 NOTE — Progress Notes (Signed)
BP 125/79   Pulse 72   Ht 5' 1.02" (1.55 m)   Wt 209 lb (94.8 kg)   SpO2 99%   BMI 39.46 kg/m    Subjective:    [name], female    DOB: 08-May-1946, 71 y.o.   MRN: [id number]  HPI: Tami Cummings is a 71 y.o. female  Annual exam AWV metrics met Patient with episodes of low blood sugar primarily at lunch time when she doesn't eat lunch and blood sugar seems to get a little low. This is just a feeling it's treated with something to eat and then she feels better. There've been no blood sugar checking for this. Patient's been using 25 units of Humalog at breakfast and supper and 60 units of Levemir in the evenings. Again is not checking her blood sugar before and after meals or in the mornings Patient does relate that she got some test strips and will be able to check again. Blood pressure doing well other medications doing well is concerned has simple bruising on her arms above senile purpura.  Relevant past medical, surgical, family and social history reviewed and updated as indicated. Interim medical history since our last visit reviewed. Allergies and medications reviewed and updated.  Review of Systems  Constitutional: Negative.   HENT: Negative.   Eyes: Negative.   Respiratory: Negative.   Cardiovascular: Negative.   Gastrointestinal: Negative.   Endocrine: Negative.   Genitourinary: Negative.   Musculoskeletal: Negative.   Skin: Negative.   Allergic/Immunologic: Negative.   Neurological: Negative.   Hematological: Negative.   Psychiatric/Behavioral: Negative.     Per HPI unless specifically indicated above     Objective:    BP 125/79   Pulse 72   Ht 5' 1.02" (1.55 m)   Wt 209 lb (94.8 kg)   SpO2 99%   BMI 39.46 kg/m   Wt Readings from Last 3 Encounters:  04/10/17 209 lb (94.8 kg)  10/30/16 202 lb (91.6 kg)  07/12/16 200 lb (90.7 kg)    Physical Exam  Constitutional: She is oriented to person, place, and time. She appears  well-developed and well-nourished.  HENT:  Head: Normocephalic and atraumatic.  Right Ear: External ear normal.  Left Ear: External ear normal.  Nose: Nose normal.  Mouth/Throat: Oropharynx is clear and moist.  Eyes: Conjunctivae and EOM are normal. Pupils are equal, round, and reactive to light.  Neck: Normal range of motion. Neck supple. Carotid bruit is not present.  Cardiovascular: Normal rate, regular rhythm and normal heart sounds.   No murmur heard. Pulmonary/Chest: Effort normal and breath sounds normal. She exhibits no mass. Right breast exhibits no mass, no skin change and no tenderness. Left breast exhibits no mass, no skin change and no tenderness. Breasts are symmetrical.  Abdominal: Soft. Bowel sounds are normal. There is no hepatosplenomegaly.  Musculoskeletal: Normal range of motion.  Neurological: She is alert and oriented to person, place, and time.  Skin: No rash noted.  Psychiatric: She has a normal mood and affect. Her behavior is normal. Judgment and thought content normal.    Results for orders placed or performed in visit on 10/30/16  Bayer DCA Hb A1c Waived  Result Value Ref Range   Bayer DCA Hb A1c Waived 8.4 (H) <7.0 %  LP+ALT+AST Piccolo, Waived  Result Value Ref Range   ALT (SGPT) Piccolo, Waived 21 10 - 47 U/L   AST (SGOT) Piccolo, Waived 26 11 - 38 U/L   Cholesterol Piccolo,  Waived 160 <200 mg/dL   HDL Chol Piccolo, Waived 45 (L) >59 mg/dL   Triglycerides Piccolo,Waived 194 (H) <150 mg/dL   Chol/HDL Ratio Piccolo,Waive 3.6 mg/dL   LDL Chol Calc Piccolo Waived 77 <100 mg/dL   VLDL Chol Calc Piccolo,Waive 39 (H) <30 mg/dL  Basic metabolic panel  Result Value Ref Range   Glucose 126 (H) 65 - 99 mg/dL   BUN 9 8 - 27 mg/dL   Creatinine, Ser 0.81 0.57 - 1.00 mg/dL   GFR calc non Af Amer 74 >59 mL/min/1.73   GFR calc Af Amer 86 >59 mL/min/1.73   BUN/Creatinine Ratio 11 (L) 12 - 28   Sodium 143 134 - 144 mmol/L   Potassium 4.0 3.5 - 5.2 mmol/L    Chloride 100 96 - 106 mmol/L   CO2 25 18 - 29 mmol/L   Calcium 9.0 8.7 - 10.3 mg/dL  Microalbumin, Urine Waived  Result Value Ref Range   Microalb, Ur Waived 150 (H) 0 - 19 mg/L   Creatinine, Urine Waived 100 10 - 300 mg/dL   Microalb/Creat Ratio 30-300 (H) <30 mg/g      Assessment & Plan:   Problem List Items Addressed This Visit      Cardiovascular and Mediastinum   Hypertension    The current medical regimen is effective;  continue present plan and medications.       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      Relevant Medications   lovastatin (MEVACOR) 40 MG tablet   spironolactone (ALDACTONE) 25 MG tablet   atenolol (TENORMIN) 100 MG tablet   amLODipine (NORVASC) 10 MG tablet     Endocrine   Diabetes mellitus without complication ([facility])    Continued poor control discuss checking blood sugar adjusting insulin      Relevant Medications   insulin lispro (HUMALOG KWIKPEN) 100 UNIT/ML KiwkPen   Insulin Detemir (LEVEMIR FLEXTOUCH) 100 UNIT/ML Pen   lovastatin (MEVACOR) 40 MG tablet   saxagliptin HCl (ONGLYZA) 5 MG TABS tablet   Other Relevant Orders   CBC with Differential/Platelet   Comprehensive metabolic panel   Urinalysis, Routine w reflex microscopic   Bayer DCA Hb A1c Waived     Other   Hyperlipidemia    The current medical regimen is effective;  continue present plan and medications.       Relevant Medications   lovastatin (MEVACOR) 40 MG tablet   saxagliptin HCl (ONGLYZA) 5 MG TABS tablet   spironolactone (ALDACTONE) 25 MG tablet   atenolol (TENORMIN) 100 MG tablet    amLODipine (NORVASC) 10 MG tablet   Other Relevant Orders   CBC with Differential/Platelet   Comprehensive metabolic panel   Urinalysis, Routine w reflex microscopic   BMI 39.0-39.9,adult    Discussed diet exercise weight loss      Advance care planning    A voluntary discussion about advance care planning including the explanation and discussion of advance directives was extensively discussed  with the patient.  Explanation about the health care proxy and Living will was reviewed and packet with forms with explanation of how to fill them  out was given.  .  Patient was offered a separate [place] visit for further assistance with forms.          Other Visit Diagnoses    Annual physical exam    -  Primary   Relevant Orders   CBC with Differential/Platelet   Comprehensive metabolic panel   Lipid panel   TSH   Urinalysis, Routine w reflex microscopic   Screening cholesterol level       Relevant Orders   Lipid panel   Thyroid disorder screen       Relevant Orders   TSH   Essential hypertension, benign       Relevant Medications   Insulin Detemir (LEVEMIR FLEXTOUCH) 100 UNIT/ML Pen   lovastatin (MEVACOR) 40 MG tablet   spironolactone (ALDACTONE) 25 MG tablet   atenolol (TENORMIN) 100 MG tablet   amLODipine (NORVASC) 10 MG tablet       Follow up plan: Return in about 3 months (around 07/10/2017) for Hemoglobin A1c.

## 2017-04-10 NOTE — Assessment & Plan Note (Signed)
Discussed care and treatment of senile purpura revenge and vitamins or protection etc.

## 2017-04-10 NOTE — Assessment & Plan Note (Signed)
A voluntary discussion about advance care planning including the explanation and discussion of advance directives was extensively discussed  with the patient.  Explanation about the health care proxy and Living will was reviewed and packet with forms with explanation of how to fill them out was given.  .  Patient was offered a separate Advance Care Planning visit for further assistance with forms.    

## 2017-04-10 NOTE — Assessment & Plan Note (Signed)
Continued poor control discuss checking blood sugar adjusting insulin

## 2017-04-11 ENCOUNTER — Encounter: Payer: Self-pay | Admitting: Family Medicine

## 2017-04-11 LAB — COMPREHENSIVE METABOLIC PANEL
ALT: 16 IU/L (ref 0–32)
AST: 17 IU/L (ref 0–40)
Albumin/Globulin Ratio: 1.7 (ref 1.2–2.2)
Albumin: 4.3 g/dL (ref 3.6–4.8)
Alkaline Phosphatase: 78 IU/L (ref 39–117)
BUN/Creatinine Ratio: 14 (ref 12–28)
BUN: 14 mg/dL (ref 8–27)
Bilirubin Total: 0.2 mg/dL (ref 0.0–1.2)
CO2: 24 mmol/L (ref 18–29)
Calcium: 9.7 mg/dL (ref 8.7–10.3)
Chloride: 105 mmol/L (ref 96–106)
Creatinine, Ser: 0.97 mg/dL (ref 0.57–1.00)
GFR calc Af Amer: 69 mL/min/{1.73_m2} (ref >59)
GFR calc non Af Amer: 60 mL/min/{1.73_m2} (ref >59)
Globulin, Total: 2.5 g/dL (ref 1.5–4.5)
Glucose: 63 mg/dL — ABNORMAL LOW (ref 65–99)
Potassium: 4 mmol/L (ref 3.5–5.2)
Sodium: 145 mmol/L — ABNORMAL HIGH (ref 134–144)
Total Protein: 6.8 g/dL (ref 6.0–8.5)

## 2017-04-11 LAB — CBC WITH DIFFERENTIAL/PLATELET
Basophils Absolute: 0 10*3/uL (ref 0.0–0.2)
Basos: 0 %
EOS (ABSOLUTE): 0.1 10*3/uL (ref 0.0–0.4)
Eos: 1 %
Hematocrit: 41.5 % (ref 34.0–46.6)
Hemoglobin: 13.6 g/dL (ref 11.1–15.9)
Immature Grans (Abs): 0 10*3/uL (ref 0.0–0.1)
Immature Granulocytes: 0 %
Lymphocytes Absolute: 2.7 10*3/uL (ref 0.7–3.1)
Lymphs: 28 %
MCH: 25.8 pg — ABNORMAL LOW (ref 26.6–33.0)
MCHC: 32.8 g/dL (ref 31.5–35.7)
MCV: 79 fL (ref 79–97)
Monocytes Absolute: 0.7 10*3/uL (ref 0.1–0.9)
Monocytes: 8 %
Neutrophils Absolute: 6.2 10*3/uL (ref 1.4–7.0)
Neutrophils: 63 %
Platelets: 214 10*3/uL (ref 150–379)
RBC: 5.27 x10E6/uL (ref 3.77–5.28)
RDW: 15.6 % — ABNORMAL HIGH (ref 12.3–15.4)
WBC: 9.8 10*3/uL (ref 3.4–10.8)

## 2017-04-11 LAB — LIPID PANEL
Chol/HDL Ratio: 4 ratio (ref 0.0–4.4)
Cholesterol, Total: 155 mg/dL (ref 100–199)
HDL: 39 mg/dL — ABNORMAL LOW (ref >39)
LDL Calculated: 87 mg/dL (ref 0–99)
Triglycerides: 147 mg/dL (ref 0–149)
VLDL Cholesterol Cal: 29 mg/dL (ref 5–40)

## 2017-04-11 LAB — TSH: TSH: 2 u[IU]/mL (ref 0.450–4.500)

## 2017-04-30 IMAGING — MG MM DIGITAL SCREENING BILAT W/ CAD
4 series · 8 of 8 positions shown · non-contrast
Comparison: Previous exam(s).

CLINICAL DATA: Screening.

EXAM:
DIGITAL SCREENING BILATERAL MAMMOGRAM WITH CAD

[R CC · right · 5 of 6 slices shown (1 of 3)]
[im 1/6]
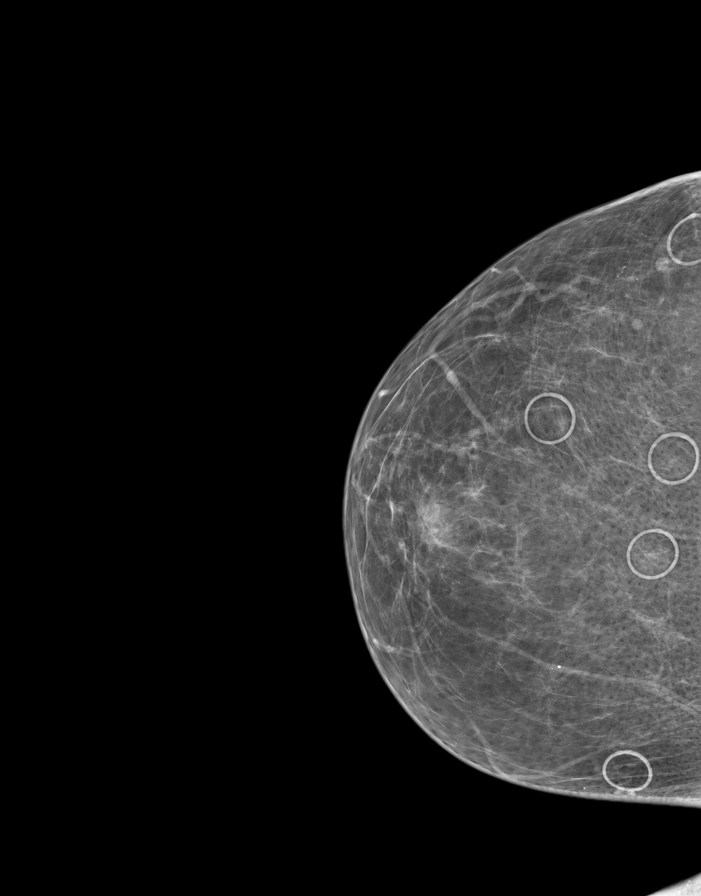
[im 2/6]
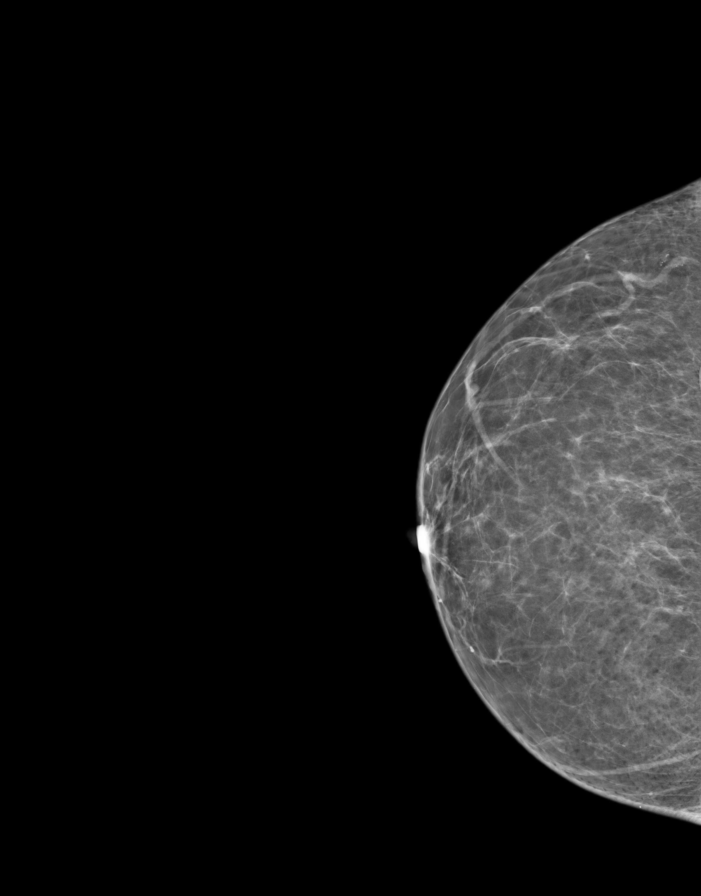
[im 3/6]
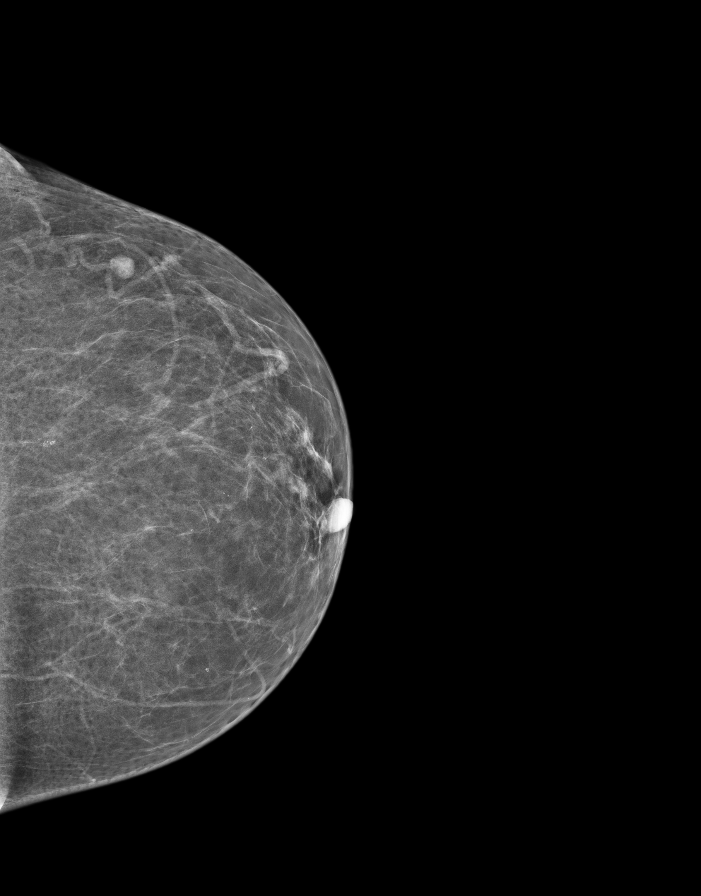
[im 4/6]
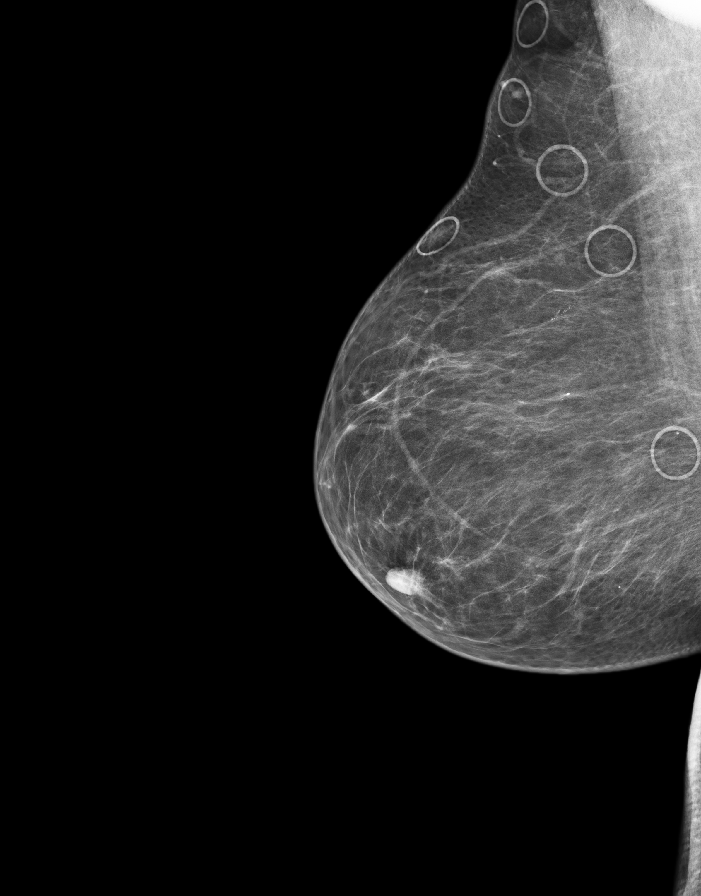
[im 6/6]
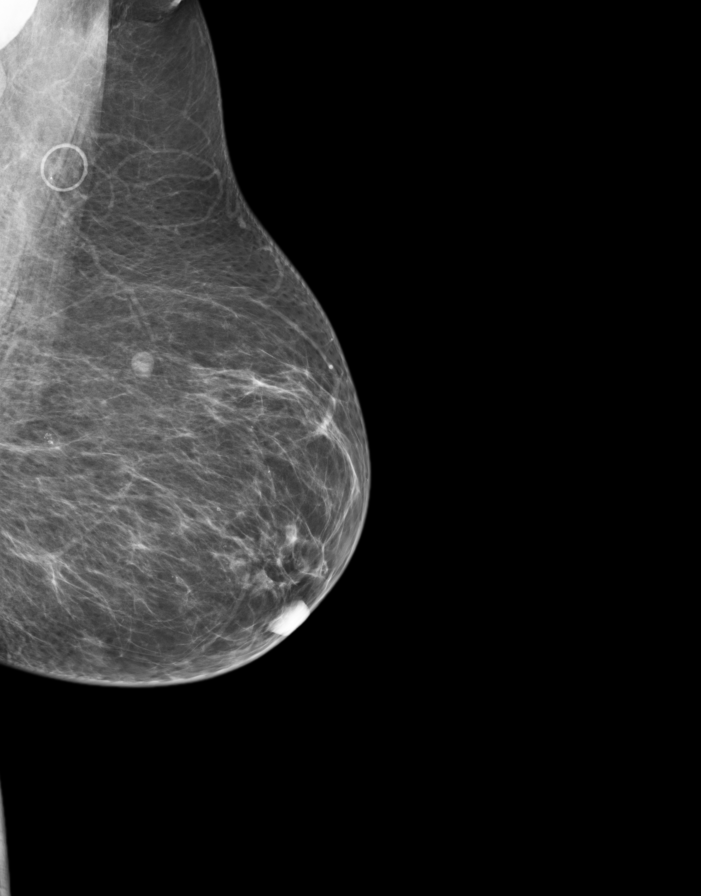

[R MLO]
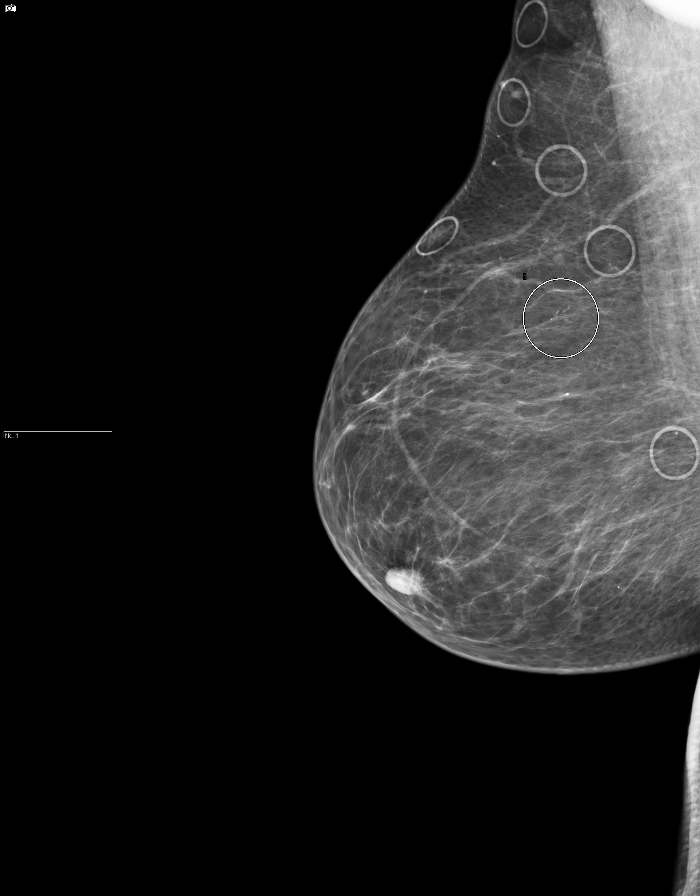

[R CC (2 of 3)]
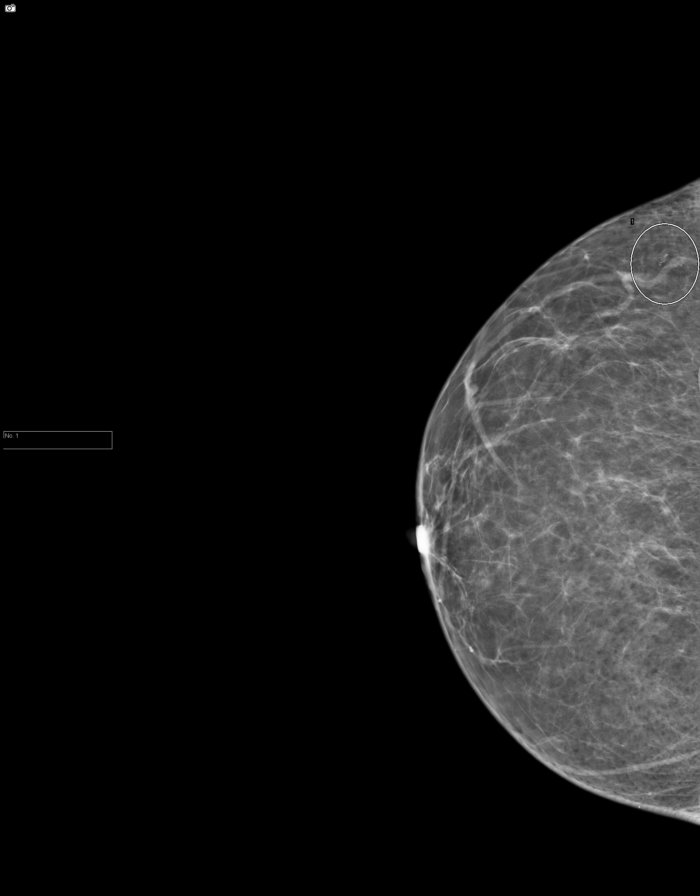

[R CC (3 of 3)]
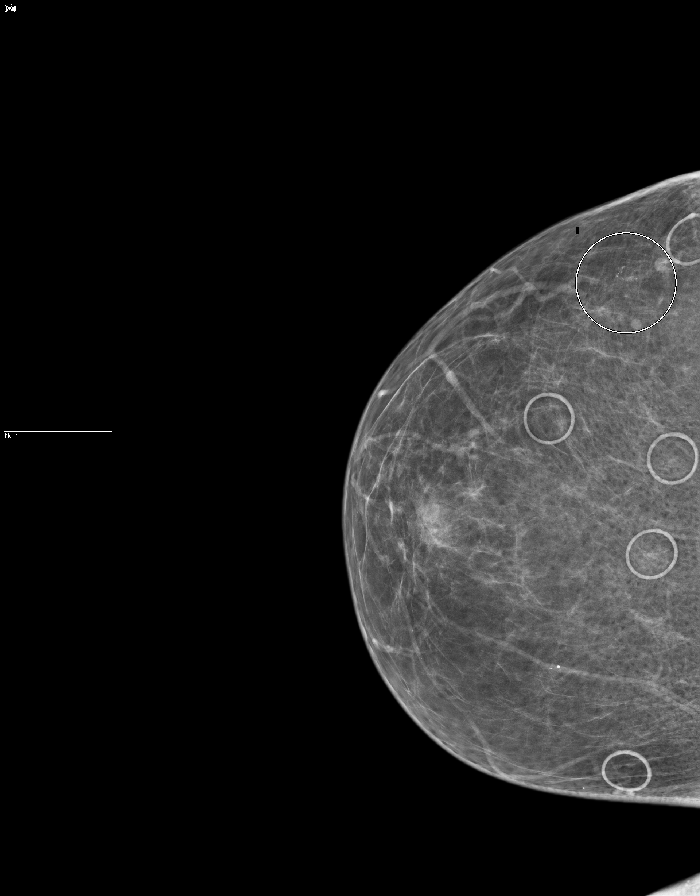

[8 of 8 positions shown; findings below may reference images not displayed]

ACR Breast Density Category b: There are scattered areas of
fibroglandular density.
FINDINGS: In the right breast, calcifications warrant further evaluation with
magnified views. In the left breast, no findings suspicious for
malignancy. Images were processed with CAD.
IMPRESSION: Further evaluation is suggested for calcifications in the right
breast.

RECOMMENDATION:
Diagnostic mammogram of the right breast. (Code:MF-[id number])

The patient will be contacted regarding the findings, and additional
imaging will be scheduled.

BI-RADS CATEGORY  0: Incomplete. Need additional imaging evaluation
and/or prior mammograms for comparison.

## 2017-05-16 IMAGING — MG MM DIGITAL DIAGNOSTIC UNILAT*R*
2 series · 2 of 2 positions shown · non-contrast
Comparison: Previous exam(s).

CLINICAL DATA: Status post stereotactic guided right breast biopsy

EXAM:
DIAGNOSTIC RIGHT MAMMOGRAM POST STEREOTACTIC BIOPSY

[R ML]
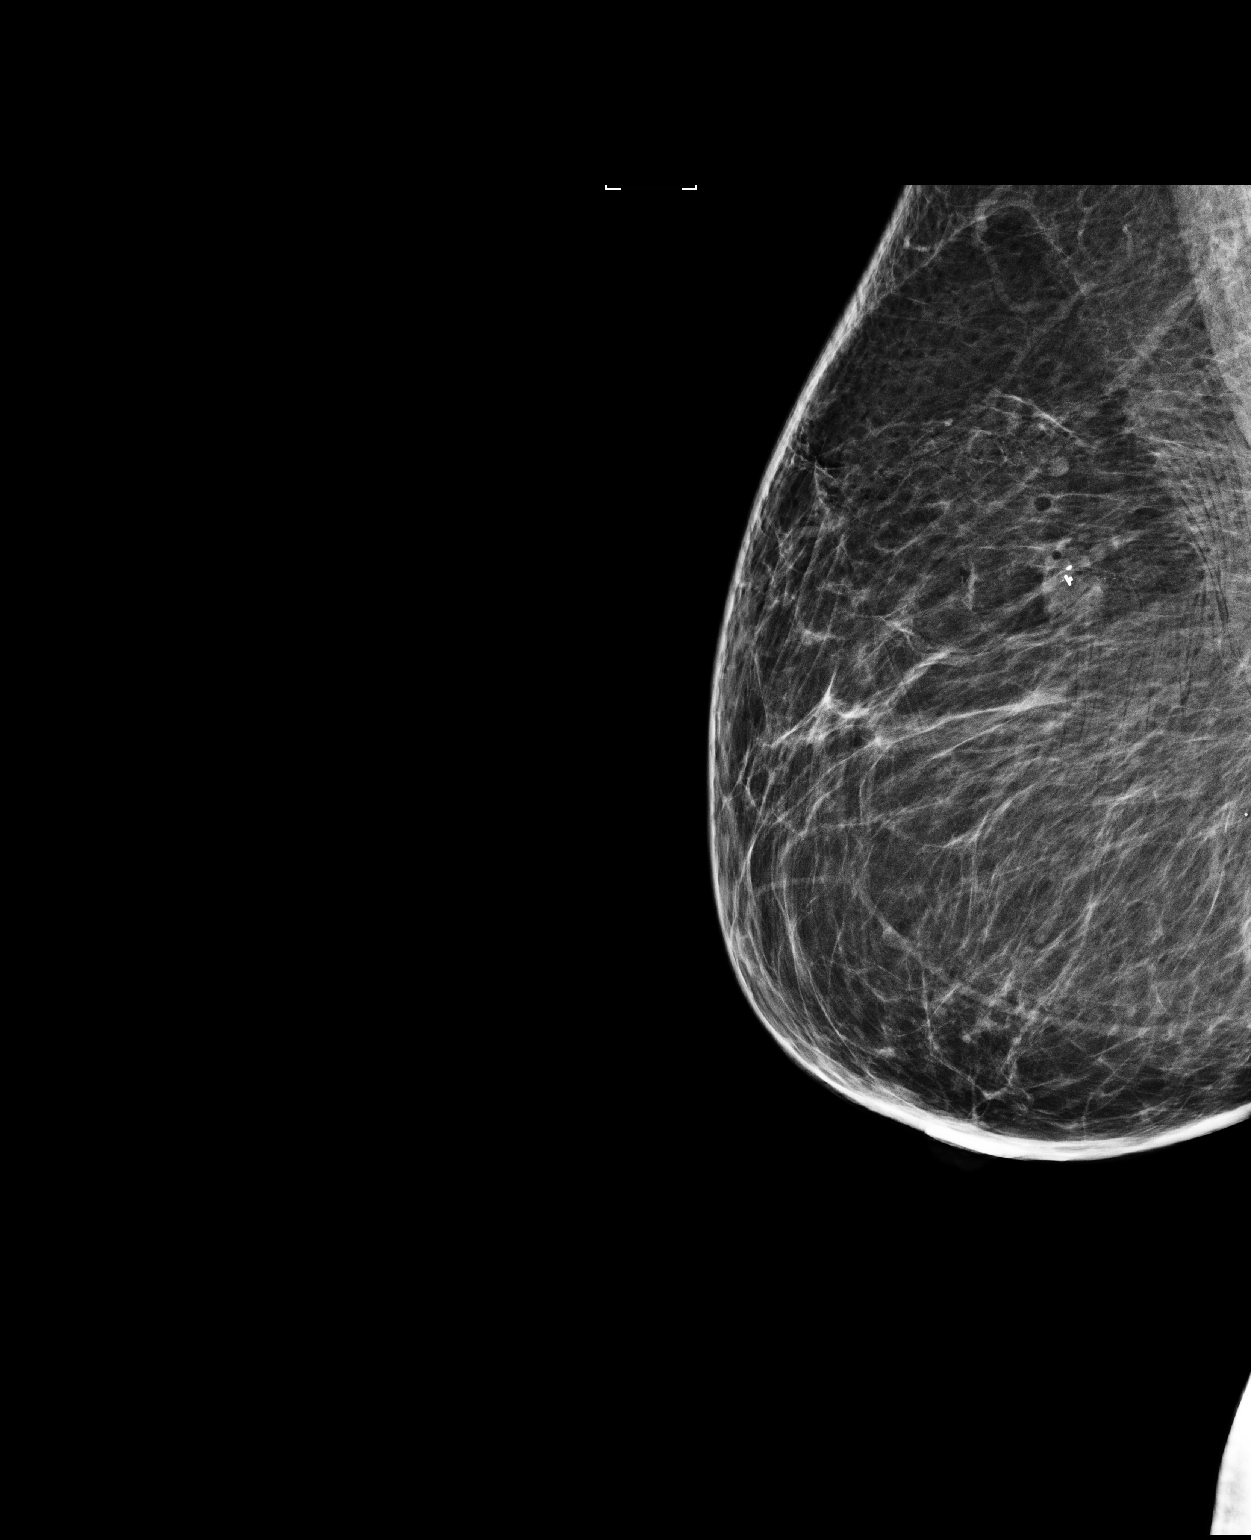

[R CC]
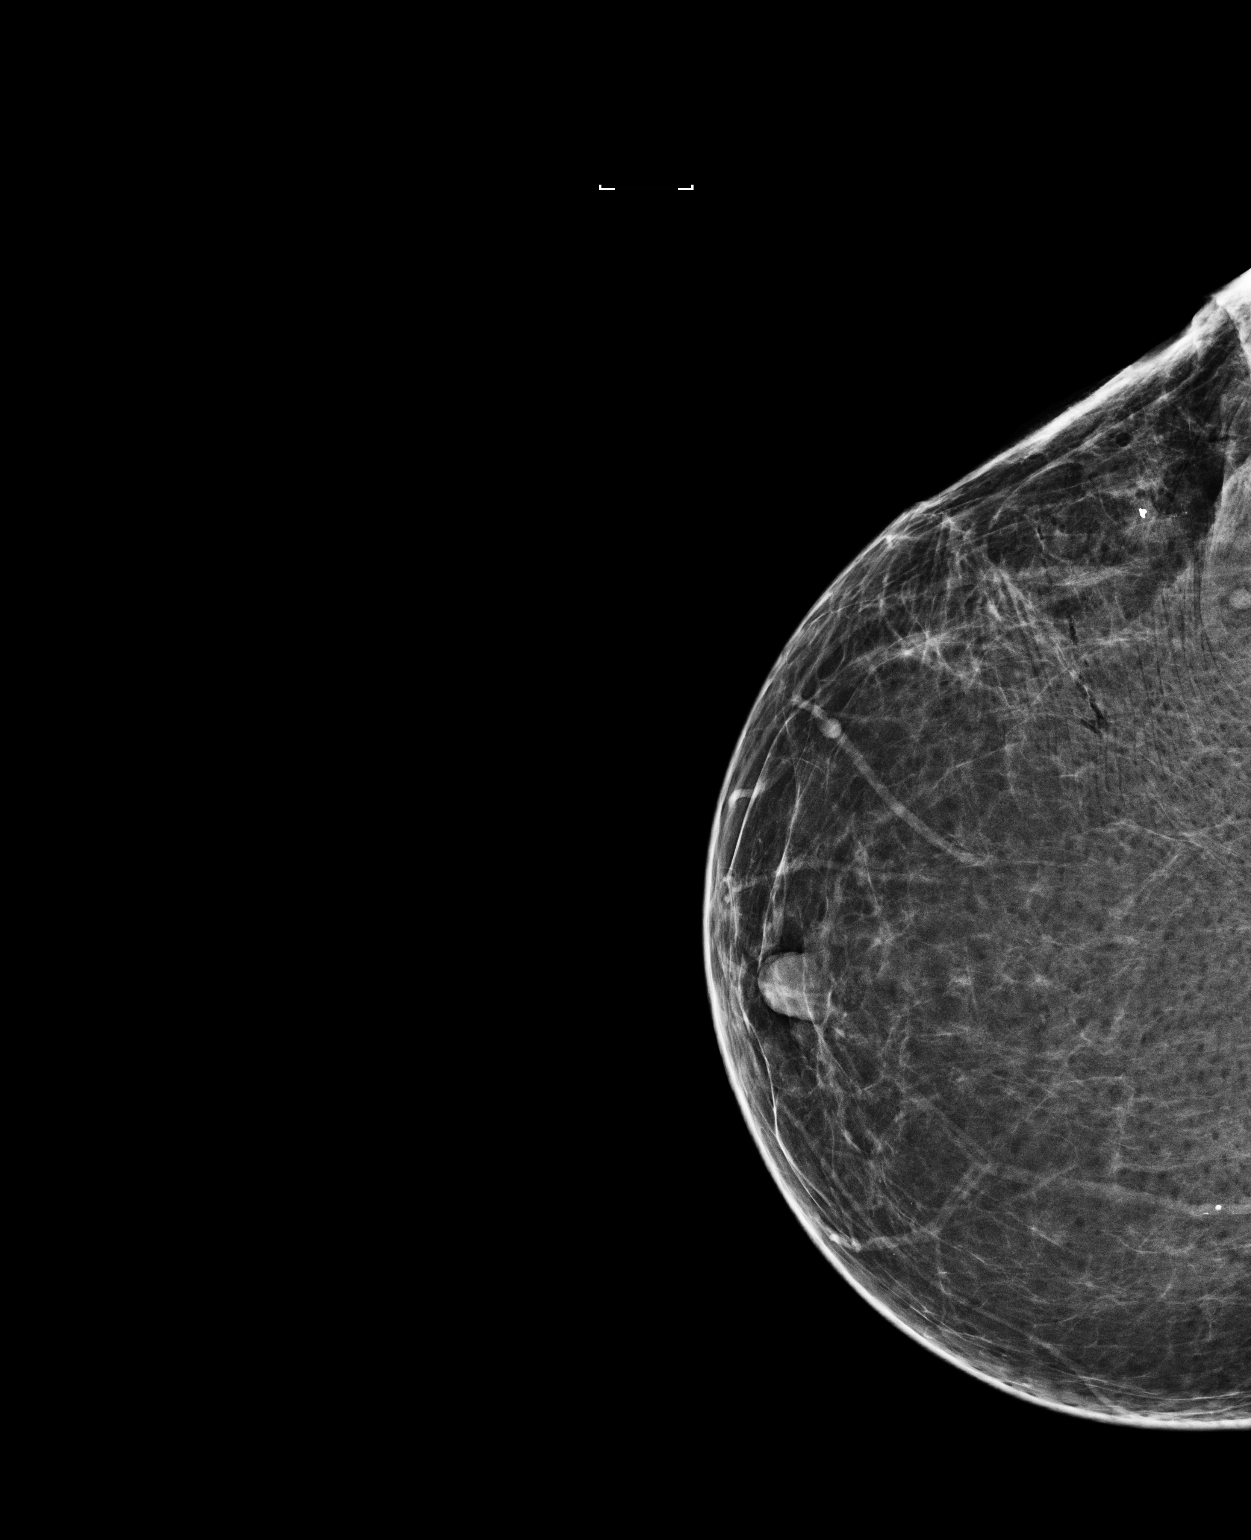

[2 of 2 positions shown; findings below may reference images not displayed]

FINDINGS: Mammographic images were obtained following stereotactic guided
biopsy of right breast calcifications. Post biopsy mammogram
demonstrates the top hat shaped biopsy marker to be in the expected
location within the upper, outer right breast.
IMPRESSION: Appropriate marker position as above.

Final Assessment: Post Procedure Mammograms for Marker Placement

## 2017-07-10 ENCOUNTER — Encounter: Payer: Self-pay | Admitting: Family Medicine

## 2017-07-10 ENCOUNTER — Ambulatory Visit (INDEPENDENT_AMBULATORY_CARE_PROVIDER_SITE_OTHER): Payer: Medicare Other | Admitting: Family Medicine

## 2017-07-10 VITALS — BP 139/75 | HR 79 | Wt 206.4 lb

## 2017-07-10 DIAGNOSIS — E119 Type 2 diabetes mellitus without complications: Secondary | ICD-10-CM | POA: Diagnosis not present

## 2017-07-10 DIAGNOSIS — E782 Mixed hyperlipidemia: Secondary | ICD-10-CM | POA: Diagnosis not present

## 2017-07-10 DIAGNOSIS — I10 Essential (primary) hypertension: Secondary | ICD-10-CM | POA: Diagnosis not present

## 2017-07-10 MED ORDER — INSULIN GLARGINE-LIXISENATIDE 100-33 UNT-MCG/ML ~~LOC~~ SOPN: 60.0000 [IU] | PEN_INJECTOR | Freq: Every day | SUBCUTANEOUS | 12 refills | Status: DC

## 2017-07-10 NOTE — Assessment & Plan Note (Signed)
The current medical regimen is effective;  continue present plan and medications.  

## 2017-07-10 NOTE — Progress Notes (Signed)
BP 139/75   Pulse 79   Wt 206 lb 6.4 oz (93.6 kg)   SpO2 98%   BMI 38.97 kg/m    Subjective:    [name], female    DOB: October 26, 1946, 71 y.o.   MRN: [id number]  HPI: Tami Cummings is a 71 y.o. female  Chief Complaint  Patient presents with  . Follow-up  . Diabetes  Discussed with patient continuing to eat a lot of sweets have problems with weight and poor control of diabetes. Patient on checking occasionally using 20 units of Humalog at breakfast and supper and 60 units of Levemir at bedtime.  Relevant past medical, surgical, family and social history reviewed and updated as indicated. Interim medical history since our last visit reviewed. Allergies and medications reviewed and updated.  Review of Systems  Constitutional: Negative.   Respiratory: Negative.   Cardiovascular: Negative.     Per HPI unless specifically indicated above     Objective:    BP 139/75   Pulse 79   Wt 206 lb 6.4 oz (93.6 kg)   SpO2 98%   BMI 38.97 kg/m   Wt Readings from Last 3 Encounters:  07/10/17 206 lb 6.4 oz (93.6 kg)  04/10/17 209 lb (94.8 kg)  10/30/16 202 lb (91.6 kg)    Physical Exam  Constitutional: She is oriented to person, place, and time. She appears well-developed and well-nourished.  HENT:  Head: Normocephalic and atraumatic.  Eyes: Conjunctivae and EOM are normal.  Neck: Normal range of motion.  Cardiovascular: Normal rate, regular rhythm and normal heart sounds.   Pulmonary/Chest: Effort normal and breath sounds normal.  Musculoskeletal: Normal range of motion.  Neurological: She is alert and oriented to person, place, and time.  Skin: No erythema.  Psychiatric: She has a normal mood and affect. Her behavior is normal. Judgment and thought content normal.    Results for orders placed or performed in visit on 04/10/17  Microscopic Examination  Result Value Ref Range   WBC, UA 11-30 (A) 0 - 5 /hpf   RBC, UA 0-2 0 - 2 /hpf   Epithelial Cells (non renal) 0-10 0 - 10 /hpf   Bacteria, UA Few None seen/Few  CBC with Differential/Platelet  Result Value Ref Range   WBC 9.8 3.4 - 10.8 x10E3/uL   RBC 5.27 3.77 - 5.28 x10E6/uL   Hemoglobin 13.6 11.1 - 15.9 g/dL   Hematocrit 41.5 34.0 - 46.6 %   MCV 79 79 - 97 fL   MCH 25.8 (L) 26.6 - 33.0 pg   MCHC 32.8 31.5 - 35.7 g/dL   RDW 15.6 (H) 12.3 - 15.4 %   Platelets 214 150 - 379 x10E3/uL   Neutrophils 63 Not Estab. %   Lymphs 28 Not Estab. %   Monocytes 8 Not Estab. %   Eos 1 Not Estab. %   Basos 0 Not Estab. %   Neutrophils Absolute 6.2 1.4 - 7.0 x10E3/uL   Lymphocytes Absolute 2.7 0.7 - 3.1 x10E3/uL   Monocytes Absolute 0.7 0.1 - 0.9 x10E3/uL   EOS (ABSOLUTE) 0.1 0.0 - 0.4 x10E3/uL   Basophils Absolute 0.0 0.0 - 0.2 x10E3/uL   Immature Granulocytes 0 Not Estab. %   Immature Grans (Abs) 0.0 0.0 - 0.1 x10E3/uL  Comprehensive metabolic panel  Result Value Ref Range   Glucose 63 (L) 65 - 99 mg/dL   BUN 14 8 - 27 mg/dL   Creatinine, Ser 0.97 0.57 - 1.00 mg/dL  GFR calc non Af Amer 60 >59 mL/min/1.73   GFR calc Af Amer 69 >59 mL/min/1.73   BUN/Creatinine Ratio 14 12 - 28   Sodium 145 (H) 134 - 144 mmol/L   Potassium 4.0 3.5 - 5.2 mmol/L   Chloride 105 96 - 106 mmol/L   CO2 24 18 - 29 mmol/L   Calcium 9.7 8.7 - 10.3 mg/dL   Total Protein 6.8 6.0 - 8.5 g/dL   Albumin 4.3 3.6 - 4.8 g/dL   Globulin, Total 2.5 1.5 - 4.5 g/dL   Albumin/Globulin Ratio 1.7 1.2 - 2.2   Bilirubin Total 0.2 0.0 - 1.2 mg/dL   Alkaline Phosphatase 78 39 - 117 IU/L   AST 17 0 - 40 IU/L   ALT 16 0 - 32 IU/L  Lipid panel  Result Value Ref Range   Cholesterol, Total 155 100 - 199 mg/dL   Triglycerides 147 0 - 149 mg/dL   HDL 39 (L) >39 mg/dL   VLDL Cholesterol Cal 29 5 - 40 mg/dL   LDL Calculated 87 0 - 99 mg/dL   Chol/HDL Ratio 4.0 0.0 - 4.4 ratio  TSH  Result Value Ref Range   TSH 2.000 0.450 - 4.500 uIU/mL  Urinalysis, Routine w reflex microscopic  Result Value Ref Range    Specific Gravity, UA <1.005 (L) 1.005 - 1.030   pH, UA 5.5 5.0 - 7.5   Color, UA Yellow Yellow   Appearance Ur Clear Clear   Leukocytes, UA 2+ (A) Negative   Protein, UA Negative Negative/Trace   Glucose, UA Negative Negative   Ketones, UA Negative Negative   RBC, UA Trace (A) Negative   Bilirubin, UA Negative Negative   Urobilinogen, Ur 0.2 0.2 - 1.0 mg/dL   Nitrite, UA Negative Negative   Microscopic Examination See below:   Bayer DCA Hb A1c Waived  Result Value Ref Range   Bayer DCA Hb A1c Waived 8.3 (H) <7.0 %      Assessment & Plan:   Problem List Items Addressed This Visit      Cardiovascular and Mediastinum   Hypertension    The current medical regimen is effective;  continue present plan and medications.         Endocrine   Diabetes mellitus without complication ([place]) - Primary    Discussed with patient diabetes continued poor control largely due to noncompliance both with diet and medications on patient's behalf. Discussed referral to endocrinology again patient declining wanting another 3 months to do better. Patient states her weakness is sugars. Patient will work to get these out of her house and do better with diet. We will also discontinue Levemir begin Sol;iqua 30 units daily adjusting by 2 units a day every 3 days. Max 60 units a day      Relevant Medications   Insulin Glargine-Lixisenatide (SOLIQUA) 100-33 UNT-MCG/ML SOPN   Other Relevant Orders   Bayer DCA Hb A1c Waived     Other   Hyperlipidemia   Relevant Orders   Bayer DCA Hb A1c Waived       Follow up plan: Return in about 3 months (around 10/10/2017) for BMP,  Lipids, ALT, AST, Hemoglobin A1c.

## 2017-07-10 NOTE — Assessment & Plan Note (Addendum)
Discussed with patient diabetes continued poor control largely due to noncompliance both with diet and medications on patient's behalf. Discussed referral to endocrinology again patient declining wanting another 3 months to do better. Patient states her weakness is sugars. Patient will work to get these out of her house and do better with diet. We will also discontinue Levemir begin Sol;iqua 30 units daily adjusting by 2 units a day every 3 days. Max 60 units a day

## 2017-07-12 LAB — BAYER DCA HB A1C WAIVED: HB A1C (BAYER DCA - WAIVED): 8.6 % — ABNORMAL HIGH (ref <7.0)

## 2017-10-16 ENCOUNTER — Ambulatory Visit (INDEPENDENT_AMBULATORY_CARE_PROVIDER_SITE_OTHER): Payer: Medicare Other | Admitting: Family Medicine

## 2017-10-16 VITALS — BP 130/68 | HR 71 | Wt 200.0 lb

## 2017-10-16 DIAGNOSIS — I10 Essential (primary) hypertension: Secondary | ICD-10-CM

## 2017-10-16 DIAGNOSIS — E782 Mixed hyperlipidemia: Secondary | ICD-10-CM | POA: Diagnosis not present

## 2017-10-16 DIAGNOSIS — E119 Type 2 diabetes mellitus without complications: Secondary | ICD-10-CM | POA: Diagnosis not present

## 2017-10-16 NOTE — Assessment & Plan Note (Signed)
The current medical regimen is effective;  continue present plan and medications.  

## 2017-10-16 NOTE — Progress Notes (Signed)
   BP 130/68   Pulse 71   Wt 200 lb (90.7 kg)   SpO2 94%   BMI 37.76 kg/m    Subjective:    [name], female    DOB: 1946/08/23, 71 y.o.   MRN: [id number]  HPI: Tami Cummings is a 71 y.o. female  Chief Complaint  Patient presents with  . Follow-up   Patient with change of Levemir to [facility] has increased Soliqua to 40 units and does well try the little higher and fasting blood sugars got too low. Otherwise noted low blood sugar spells is doing better has also lost 9 pounds so far this year. And is doing better with her sugars but still a weakness. No issues with cholesterol doing well. No complaints from medications. Blood pressure also doing well.  Relevant past medical, surgical, family and social history reviewed and updated as indicated. Interim medical history since our last visit reviewed. Allergies and medications reviewed and updated.  Review of Systems  Constitutional: Negative.   Respiratory: Negative.   Cardiovascular: Negative.     Per HPI unless specifically indicated above     Objective:    BP 130/68   Pulse 71   Wt 200 lb (90.7 kg)   SpO2 94%   BMI 37.76 kg/m   Wt Readings from Last 3 Encounters:  10/16/17 200 lb (90.7 kg)  07/10/17 206 lb 6.4 oz (93.6 kg)  04/10/17 209 lb (94.8 kg)    Physical Exam  Constitutional: She is oriented to person, place, and time. She appears well-developed and well-nourished.  HENT:  Head: Normocephalic and atraumatic.  Eyes: Conjunctivae and EOM are normal.  Neck: Normal range of motion.  Cardiovascular: Normal rate, regular rhythm and normal heart sounds.   Pulmonary/Chest: Effort normal and breath sounds normal.  Musculoskeletal: Normal range of motion.  Neurological: She is alert and oriented to person, place, and time.  Skin: No erythema.  Psychiatric: She has a normal mood and affect. Her behavior is normal. Judgment and thought content normal.    Results for orders placed or  performed in visit on 07/10/17  Bayer DCA Hb A1c Waived  Result Value Ref Range   Bayer DCA Hb A1c Waived 8.6 (H) <7.0 %      Assessment & Plan:   Problem List Items Addressed This Visit      Cardiovascular and Mediastinum   Hypertension - Primary    The current medical regimen is effective;  continue present plan and medications.       Relevant Orders   Basic metabolic panel   LP+ALT+AST Piccolo, Waived   Bayer DCA Hb A1c Waived     Endocrine   Diabetes mellitus without complication [facility])    The current medical regimen is effective;  continue present plan and medications.       Relevant Orders   Basic metabolic panel   LP+ALT+AST Piccolo, Waived   Bayer DCA Hb A1c Waived     Other   Hyperlipidemia    The current medical regimen is effective;  continue present plan and medications.       Relevant Orders   Basic metabolic panel   LP+ALT+AST Piccolo, Waived   Bayer DCA Hb A1c Waived       Follow up plan: Return in about 3 months (around 01/16/2018) for Hemoglobin A1c.

## 2017-10-17 LAB — BASIC METABOLIC PANEL
BUN/Creatinine Ratio: 12 (ref 12–28)
BUN: 12 mg/dL (ref 8–27)
CO2: 23 mmol/L (ref 20–29)
Calcium: 9 mg/dL (ref 8.7–10.3)
Chloride: 105 mmol/L (ref 96–106)
Creatinine, Ser: 1 mg/dL (ref 0.57–1.00)
GFR calc Af Amer: 66 mL/min/{1.73_m2} (ref >59)
GFR calc non Af Amer: 57 mL/min/{1.73_m2} — ABNORMAL LOW (ref >59)
Glucose: 74 mg/dL (ref 65–99)
Potassium: 4 mmol/L (ref 3.5–5.2)
Sodium: 142 mmol/L (ref 134–144)

## 2017-10-18 ENCOUNTER — Encounter: Payer: Self-pay | Admitting: Family Medicine

## 2017-10-18 LAB — LP+ALT+AST PICCOLO, WAIVED
ALT (SGPT) Piccolo, Waived: 16 U/L (ref 10–47)
AST (SGOT) Piccolo, Waived: 23 U/L (ref 11–38)
Chol/HDL Ratio Piccolo,Waive: 3.3 mg/dL
Cholesterol Piccolo, Waived: 123 mg/dL (ref <200)
HDL Chol Piccolo, Waived: 37 mg/dL — ABNORMAL LOW (ref >59)
LDL Chol Calc Piccolo Waived: 65 mg/dL (ref <100)
Triglycerides Piccolo,Waived: 102 mg/dL (ref <150)
VLDL Chol Calc Piccolo,Waive: 20 mg/dL (ref <30)

## 2017-10-18 LAB — BAYER DCA HB A1C WAIVED: HB A1C (BAYER DCA - WAIVED): 7.7 % — ABNORMAL HIGH (ref <7.0)

## 2017-11-14 ENCOUNTER — Other Ambulatory Visit: Payer: Self-pay | Admitting: Family Medicine

## 2017-11-14 DIAGNOSIS — E119 Type 2 diabetes mellitus without complications: Secondary | ICD-10-CM

## 2017-11-15 NOTE — Telephone Encounter (Signed)
Wasn't sure of the dose and how to prescribe the pen.

## 2018-01-29 ENCOUNTER — Encounter: Payer: Self-pay | Admitting: Family Medicine

## 2018-01-29 ENCOUNTER — Ambulatory Visit (INDEPENDENT_AMBULATORY_CARE_PROVIDER_SITE_OTHER): Payer: Medicare Other | Admitting: Family Medicine

## 2018-01-29 VITALS — BP 138/75 | HR 79 | Wt 204.0 lb

## 2018-01-29 DIAGNOSIS — E119 Type 2 diabetes mellitus without complications: Secondary | ICD-10-CM

## 2018-01-29 DIAGNOSIS — I10 Essential (primary) hypertension: Secondary | ICD-10-CM | POA: Diagnosis not present

## 2018-01-29 DIAGNOSIS — E782 Mixed hyperlipidemia: Secondary | ICD-10-CM

## 2018-01-29 LAB — BAYER DCA HB A1C WAIVED: HB A1C (BAYER DCA - WAIVED): 7.7 % — ABNORMAL HIGH (ref <7.0)

## 2018-01-29 NOTE — Assessment & Plan Note (Signed)
The current medical regimen is effective;  continue present plan and medications.  

## 2018-01-29 NOTE — Progress Notes (Signed)
BP 138/75 (BP Location: Left Arm)   Pulse 79   Wt 204 lb (92.5 kg)   SpO2 97%   BMI 38.52 kg/m    Subjective:    [name], female    DOB: 04-02-1946, 72 y.o.   MRN: [id number]  HPI: Tami Cummings is a 72 y.o. female  Chief Complaint  Patient presents with  . Diabetes  Patient follow-up diabetes all in all doing well has rare low blood sugar spells but does well otherwise.  And doing better than this summer. No issues with insulin her low blood sugar spell was taken the insulin and not eating enough. Hypertension doing well and chart reviewed cholesterol doing well no complaints from medications taken faithfully without problems.  Relevant past medical, surgical, family and social history reviewed and updated as indicated. Interim medical history since our last visit reviewed. Allergies and medications reviewed and updated.  Review of Systems  Constitutional: Negative.   Respiratory: Negative.   Cardiovascular: Negative.     Per HPI unless specifically indicated above     Objective:    BP 138/75 (BP Location: Left Arm)   Pulse 79   Wt 204 lb (92.5 kg)   SpO2 97%   BMI 38.52 kg/m   Wt Readings from Last 3 Encounters:  01/29/18 204 lb (92.5 kg)  10/16/17 200 lb (90.7 kg)  07/10/17 206 lb 6.4 oz (93.6 kg)    Physical Exam  Constitutional: She is oriented to person, place, and time. She appears well-developed and well-nourished.  HENT:  Head: Normocephalic and atraumatic.  Eyes: Conjunctivae and EOM are normal.  Neck: Normal range of motion.  Cardiovascular: Normal rate, regular rhythm and normal heart sounds.  Pulmonary/Chest: Effort normal and breath sounds normal.  Musculoskeletal: Normal range of motion.  Neurological: She is alert and oriented to person, place, and time.  Skin: No erythema.  Psychiatric: She has a normal mood and affect. Her behavior is normal. Judgment and thought content normal.    Results for orders  placed or performed in visit on 24/23/53  Basic metabolic panel  Result Value Ref Range   Glucose 74 65 - 99 mg/dL   BUN 12 8 - 27 mg/dL   Creatinine, Ser 1.00 0.57 - 1.00 mg/dL   GFR calc non Af Amer 57 (L) >59 mL/min/1.73   GFR calc Af Amer 66 >59 mL/min/1.73   BUN/Creatinine Ratio 12 12 - 28   Sodium 142 134 - 144 mmol/L   Potassium 4.0 3.5 - 5.2 mmol/L   Chloride 105 96 - 106 mmol/L   CO2 23 20 - 29 mmol/L   Calcium 9.0 8.7 - 10.3 mg/dL  LP+ALT+AST Piccolo, Waived  Result Value Ref Range   ALT (SGPT) Piccolo, Waived 16 10 - 47 U/L   AST (SGOT) Piccolo, Waived 23 11 - 38 U/L   Cholesterol Piccolo, Waived 123 <200 mg/dL   HDL Chol Piccolo, Waived 37 (L) >59 mg/dL   Triglycerides Piccolo,Waived 102 <150 mg/dL   Chol/HDL Ratio Piccolo,Waive 3.3 mg/dL   LDL Chol Calc Piccolo Waived 65 <100 mg/dL   VLDL Chol Calc Piccolo,Waive 20 <30 mg/dL  Bayer DCA Hb A1c Waived  Result Value Ref Range   Bayer DCA Hb A1c Waived 7.7 (H) <7.0 %      Assessment & Plan:   Problem List Items Addressed This Visit      Cardiovascular and Mediastinum   Hypertension    The current medical regimen is effective;  continue present plan and medications.       Relevant Orders   Bayer DCA Hb A1c Waived     Endocrine   Diabetes mellitus without complication ([place]) - Primary    With diabetes doing okay room for improvement discussed with patient.      Relevant Orders   Bayer DCA Hb A1c Waived     Other   Hyperlipidemia    The current medical regimen is effective;  continue present plan and medications.       Relevant Orders   Bayer DCA Hb A1c Waived       Follow up plan: Return in about 3 months (around 04/28/2018) for Physical Exam, Hemoglobin A1c.

## 2018-01-29 NOTE — Assessment & Plan Note (Signed)
With diabetes doing okay room for improvement discussed with patient.

## 2018-03-05 ENCOUNTER — Other Ambulatory Visit: Payer: Self-pay | Admitting: Family Medicine

## 2018-03-05 DIAGNOSIS — Z1231 Encounter for screening mammogram for malignant neoplasm of breast: Secondary | ICD-10-CM

## 2018-04-01 ENCOUNTER — Encounter: Payer: Self-pay | Admitting: Family Medicine

## 2018-04-01 ENCOUNTER — Ambulatory Visit
Admission: RE | Admit: 2018-04-01 | Discharge: 2018-04-01 | Disposition: A | Discharge status: Home / Self Care | Payer: Medicare Other | Source: Ambulatory Visit | Attending: Family Medicine | Admitting: Family Medicine

## 2018-04-01 DIAGNOSIS — E1165 Type 2 diabetes mellitus with hyperglycemia: Secondary | ICD-10-CM | POA: Diagnosis not present

## 2018-04-01 DIAGNOSIS — Z1231 Encounter for screening mammogram for malignant neoplasm of breast: Secondary | ICD-10-CM | POA: Diagnosis not present

## 2018-04-01 LAB — HM DIABETES EYE EXAM

## 2018-04-19 DIAGNOSIS — E119 Type 2 diabetes mellitus without complications: Secondary | ICD-10-CM | POA: Diagnosis not present

## 2018-05-17 ENCOUNTER — Other Ambulatory Visit: Payer: Self-pay | Admitting: Family Medicine

## 2018-05-21 DIAGNOSIS — H2511 Age-related nuclear cataract, right eye: Secondary | ICD-10-CM | POA: Diagnosis not present

## 2018-05-23 ENCOUNTER — Ambulatory Visit (INDEPENDENT_AMBULATORY_CARE_PROVIDER_SITE_OTHER): Payer: Medicare Other

## 2018-05-23 VITALS — BP 132/73 | HR 79 | Temp 98.0°F | Resp 16 | Ht 60.0 in | Wt 205.0 lb

## 2018-05-23 DIAGNOSIS — Z0000 Encounter for general adult medical examination without abnormal findings: Secondary | ICD-10-CM | POA: Diagnosis not present

## 2018-05-23 DIAGNOSIS — E119 Type 2 diabetes mellitus without complications: Secondary | ICD-10-CM

## 2018-05-23 DIAGNOSIS — R5383 Other fatigue: Secondary | ICD-10-CM | POA: Diagnosis not present

## 2018-05-23 DIAGNOSIS — I10 Essential (primary) hypertension: Secondary | ICD-10-CM

## 2018-05-23 DIAGNOSIS — E782 Mixed hyperlipidemia: Secondary | ICD-10-CM

## 2018-05-23 LAB — URINALYSIS, ROUTINE W REFLEX MICROSCOPIC
Bilirubin, UA: NEGATIVE
Glucose, UA: NEGATIVE
Ketones, UA: NEGATIVE
Nitrite, UA: NEGATIVE
RBC, UA: NEGATIVE
Specific Gravity, UA: >1.03 — ABNORMAL HIGH (ref 1.005–1.030)
Urobilinogen, Ur: 0.2 mg/dL (ref 0.2–1.0)
pH, UA: 5 (ref 5.0–7.5)

## 2018-05-23 LAB — MICROSCOPIC EXAMINATION: Bacteria, UA: NONE SEEN

## 2018-05-23 NOTE — Patient Instructions (Addendum)
[name] , Thank you for taking time to come for your Medicare Wellness Visit. I appreciate your ongoing commitment to your health goals. Please review the following plan we discussed and let me know if I can assist you in the future.   Screening recommendations/referrals: Colonoscopy: completed 10/25/2015, cologuard ordered Mammogram: completed 04/01/2018 Bone Density: completed 03/27/2016 Recommended yearly ophthalmology/optometry visit for glaucoma screening and checkup Recommended yearly dental visit for hygiene and checkup  Vaccinations: Influenza vaccine: up to date Pneumococcal vaccine: up to date Tdap vaccine: up to date Shingles vaccine: shingrix eligible, check with your insurance company for coverage information.     Advanced directives: Advance directive discussed with you today. Even though you declined this today please call our office should you change your mind and we can give you the proper paperwork for you to fill out.  Conditions/risks identified: Recommend drinking at least 6-8 glasses of water a day   Next appointment: Follow up on 05/30/2018 at 10:00am with Dr.Crissman. Follow up in one year for your annual wellness exam.    Preventive Care 71 Years and Older, Female Preventive care refers to lifestyle choices and visits with your health care provider that can promote health and wellness. What does preventive care include?  A yearly physical exam. This is also called an annual well check.  Dental exams once or twice a year.  Routine eye exams. Ask your health care provider how often you should have your eyes checked.  Personal lifestyle choices, including:  Daily care of your teeth and gums.  Regular physical activity.  Eating a healthy diet.  Avoiding tobacco and drug use.  Limiting alcohol use.  Practicing safe sex.  Taking low-dose aspirin every day.  Taking vitamin and mineral supplements as recommended by your health care provider. What  happens during an annual well check? The services and screenings done by your health care provider during your annual well check will depend on your age, overall health, lifestyle risk factors, and family history of disease. Counseling  Your health care provider may ask you questions about your:  Alcohol use.  Tobacco use.  Drug use.  Emotional well-being.  Home and relationship well-being.  Sexual activity.  Eating habits.  History of falls.  Memory and ability to understand (cognition).  Work and work Statistician.  Reproductive health. Screening  You may have the following tests or measurements:  Height, weight, and BMI.  Blood pressure.  Lipid and cholesterol levels. These may be checked every 5 years, or more frequently if you are over 68 years old.  Skin check.  Lung cancer screening. You may have this screening every year starting at age 71 if you have a 30-pack-year history of smoking and currently smoke or have quit within the past 15 years.  Fecal occult blood test (FOBT) of the stool. You may have this test every year starting at age 71.  Flexible sigmoidoscopy or colonoscopy. You may have a sigmoidoscopy every 5 years or a colonoscopy every 10 years starting at age 71.  Hepatitis C blood test.  Hepatitis B blood test.  Sexually transmitted disease (STD) testing.  Diabetes screening. This is done by checking your blood sugar (glucose) after you have not eaten for a while (fasting). You may have this done every 1-3 years.  Bone density scan. This is done to screen for osteoporosis. You may have this done starting at age 71.  Mammogram. This may be done every 1-2 years. Talk to your health care provider about  how often you should have regular mammograms. Talk with your health care provider about your test results, treatment options, and if necessary, the need for more tests. Vaccines  Your health care provider may recommend certain vaccines, such  as:  Influenza vaccine. This is recommended every year.  Tetanus, diphtheria, and acellular pertussis (Tdap, Td) vaccine. You may need a Td booster every 10 years.  Zoster vaccine. You may need this after age 71.  Pneumococcal 13-valent conjugate (PCV13) vaccine. One dose is recommended after age 71.  Pneumococcal polysaccharide (PPSV23) vaccine. One dose is recommended after age 71. Talk to your health care provider about which screenings and vaccines you need and how often you need them. This information is not intended to replace advice given to you by your health care provider. Make sure you discuss any questions you have with your health care provider. Document Released: 12/31/2015 Document Revised: 08/23/2016 Document Reviewed: 10/05/2015 Elsevier Interactive Patient Education  2017 [place] Prevention in the Home Falls can cause injuries. They can happen to people of all ages. There are many things you can do to make your home safe and to help prevent falls. What can I do on the outside of my home?  Regularly fix the edges of walkways and driveways and fix any cracks.  Remove anything that might make you trip as you walk through a door, such as a raised step or threshold.  Trim any bushes or trees on the path to your home.  Use bright outdoor lighting.  Clear any walking paths of anything that might make someone trip, such as rocks or tools.  Regularly check to see if handrails are loose or broken. Make sure that both sides of any steps have handrails.  Any raised decks and porches should have guardrails on the edges.  Have any leaves, snow, or ice cleared regularly.  Use sand or salt on walking paths during winter.  Clean up any spills in your garage right away. This includes oil or grease spills. What can I do in the bathroom?  Use night lights.  Install grab bars by the toilet and in the tub and shower. Do not use towel bars as grab bars.  Use  non-skid mats or decals in the tub or shower.  If you need to sit down in the shower, use a plastic, non-slip stool.  Keep the floor dry. Clean up any water that spills on the floor as soon as it happens.  Remove soap buildup in the tub or shower regularly.  Attach bath mats securely with double-sided non-slip rug tape.  Do not have throw rugs and other things on the floor that can make you trip. What can I do in the bedroom?  Use night lights.  Make sure that you have a light by your bed that is easy to reach.  Do not use any sheets or blankets that are too big for your bed. They should not hang down onto the floor.  Have a firm chair that has side arms. You can use this for support while you get dressed.  Do not have throw rugs and other things on the floor that can make you trip. What can I do in the kitchen?  Clean up any spills right away.  Avoid walking on wet floors.  Keep items that you use a lot in easy-to-reach places.  If you need to reach something above you, use a strong step stool that has a grab bar.  Keep  electrical cords out of the way.  Do not use floor polish or wax that makes floors slippery. If you must use wax, use non-skid floor wax.  Do not have throw rugs and other things on the floor that can make you trip. What can I do with my stairs?  Do not leave any items on the stairs.  Make sure that there are handrails on both sides of the stairs and use them. Fix handrails that are broken or loose. Make sure that handrails are as long as the stairways.  Check any carpeting to make sure that it is firmly attached to the stairs. Fix any carpet that is loose or worn.  Avoid having throw rugs at the top or bottom of the stairs. If you do have throw rugs, attach them to the floor with carpet tape.  Make sure that you have a light switch at the top of the stairs and the bottom of the stairs. If you do not have them, ask someone to add them for you. What  else can I do to help prevent falls?  Wear shoes that:  Do not have high heels.  Have rubber bottoms.  Are comfortable and fit you well.  Are closed at the toe. Do not wear sandals.  If you use a stepladder:  Make sure that it is fully opened. Do not climb a closed stepladder.  Make sure that both sides of the stepladder are locked into place.  Ask someone to hold it for you, if possible.  Clearly mark and make sure that you can see:  Any grab bars or handrails.  First and last steps.  Where the edge of each step is.  Use tools that help you move around (mobility aids) if they are needed. These include:  Canes.  Walkers.  Scooters.  Crutches.  Turn on the lights when you go into a dark area. Replace any light bulbs as soon as they burn out.  Set up your furniture so you have a clear path. Avoid moving your furniture around.  If any of your floors are uneven, fix them.  If there are any pets around you, be aware of where they are.  Review your medicines with your doctor. Some medicines can make you feel dizzy. This can increase your chance of falling. Ask your doctor what other things that you can do to help prevent falls. This information is not intended to replace advice given to you by your health care provider. Make sure you discuss any questions you have with your health care provider. Document Released: 09/30/2009 Document Revised: 05/11/2016 Document Reviewed: 01/08/2015 Elsevier Interactive Patient Education  2017 [employer].

## 2018-05-23 NOTE — Progress Notes (Addendum)
Subjective:   Tami Cummings is a 72 y.o. female who presents for an Initial Medicare Annual Wellness Visit.  Review of Systems     Cardiac Risk Factors include: advanced age (>66mn, >>74women);diabetes mellitus;hypertension;dyslipidemia;obesity (BMI >30kg/m2);smoking/ tobacco exposure     Objective:    Today's Vitals   05/23/18 0927 05/23/18 0932  BP: 132/73   Pulse: 79   Resp: 16   Temp: 98 F (36.7 C)   TempSrc: Oral   SpO2: 98%   Weight: 205 lb (93 kg)   Height: 5' (1.524 m)   PainSc:  10-Worst pain ever   Body mass index is 40.04 kg/m.  Advanced Directives 05/23/2018 09/20/2015  Does Patient Have a Medical Advance Directive? No No  Would patient like information on creating a medical advance directive? No - Patient declined No - patient declined information    Current Medications (verified) Outpatient Encounter Medications as of 05/23/2018  Medication Sig  . amLODipine (NORVASC) 10 MG tablet Take 1 tablet (10 mg total) by mouth daily.  .Marland Kitchenaspirin EC 81 MG tablet Take 81 mg by mouth daily.  .Marland Kitchenatenolol (TENORMIN) 100 MG tablet Take 1 tablet (100 mg total) by mouth daily.  . BD PEN NEEDLE MICRO U/F 32G X 6 MM MISC USE 4-5 TIMES DAILY WITH INSULIN  . Blood Glucose Monitoring Suppl (ONE TOUCH ULTRA 2) w/Device KIT USE ONCE TO TWICE DAILY  . HUMALOG KWIKPEN 100 UNIT/ML KiwkPen INJECT 10-15 UNITS INTO THE SKIN THREE TIMES DAILY WITH MEALS  . Insulin Glargine-Lixisenatide (SOLIQUA) 100-33 UNT-MCG/ML SOPN Inject 60 Units into the skin daily. Start 30 u increase 2 u every 2 days as needed max 60u a day  . insulin lispro (HUMALOG KWIKPEN) 100 UNIT/ML KiwkPen Inject 0.25 mLs (25 Units total) into the skin 2 (two) times daily.  .Marland Kitchenlovastatin (MEVACOR) 40 MG tablet Take 1 tablet (40 mg total) by mouth daily with breakfast.  . naproxen sodium (ALEVE) 220 MG tablet Take 220 mg by mouth 2 (two) times daily as needed.  . ONE TOUCH ULTRA TEST test strip USE ONE TO TWO STRIPS TO  CHECK GLUCOSE ONCE DAILY  . saxagliptin HCl (ONGLYZA) 5 MG TABS tablet Take 1 tablet (5 mg total) by mouth daily.  .Marland Kitchenspironolactone (ALDACTONE) 25 MG tablet Take 1 tablet (25 mg total) by mouth daily.  .Marland Kitchentrolamine salicylate (ASPERCREME) 10 % cream Apply 1 application topically as needed for muscle pain.   No facility-administered encounter medications on file as of 05/23/2018.     Allergies (verified) Actos [pioglitazone]; Amlodipine; Benazepril; Hydrochlorothiazide; and Metformin and related   History: Past Medical History:  Diagnosis Date  . Arthritis    knee  . COPD (ch[place] obstructive [place] disease) ([facility]   . Diabetes mellitus without complication ([facility]   . Hyperlipidemia   . Hypertension    Past Surgical History:  Procedure Laterality Date  . ABDOMINAL HYSTERECTOMY    . BREAST BIOPSY Right 04/12/2016   stereo bx/clip- neg  . CARPAL TUNNEL RELEASE  06/17/00  . CATARACT EXTRACTION W/PHACO Left 09/20/2015   Procedure: CATARACT EXTRACTION PHACO AND INTRAOCULAR LENS PLACEMENT ([place];  Surgeon: ARonnell Freshwater MD;  [place] [facility]  Service: Ophthalmology;  Laterality: Left;  DIABETIC - insulin  . CHOLECYSTECTOMY    . EYE SURGERY     Family History  Problem Relation Age of Onset  . Heart disease Father   . Cancer Sister   . Cancer Brother   . Breast cancer  Neg Hx    Social History   Socioeconomic History  . Marital status: Single    Spouse name: Not on file  . Number of children: Not on file  . Years of education: Not on file  . Highest education level: Not on file  Occupational History  . Not on file  Social Needs  . Financial resource strain: Not hard at all  . Food insecurity:    Worry: Never true    Inability: Never true  . Transportation needs:    Medical: No    Non-medical: No  Tobacco Use  . Smoking status: Former Smoker    Types: Cigarettes    Last attempt to quit: 08/26/2013    Years since quitting: 4.7  . Smokeless tobacco:  Never Used  Substance and Sexual Activity  . Alcohol use: No    Alcohol/week: 0.0 oz  . Drug use: No  . Sexual activity: Not on file  Lifestyle  . Physical activity:    Days per week: 0 days    Minutes per session: 0 min  . Stress: Not at all  Relationships  . Social connections:    Talks on phone: More than three times a week    Gets together: More than three times a week    Attends religious service: Never    Active member of club or organization: No    Attends meetings of clubs or organizations: Never    Relationship status: Divorced  Other Topics Concern  . Not on file  Social History Narrative  . Not on file    Tobacco Counseling Counseling given: Not Answered   Clinical Intake:  Pre-visit preparation completed: Yes  Pain : 0-10 Pain Score: 10-Worst pain ever Pain Type: Acute pain Pain Location: Arm Pain Orientation: Left Pain Descriptors / Indicators: Aching, Heaviness Pain Onset: 1 to 4 weeks ago(doesnt remember any injury ) Pain Frequency: Intermittent Effect of Pain on Daily Activities: cannot lift arm.      Nutritional Status: BMI > 30  Obese Nutritional Risks: None Diabetes: Yes CBG done?: No Did pt. bring in CBG monitor from home?: No  How often do you need to have someone help you when you read instructions, pamphlets, or other written materials from your doctor or pharmacy?: 1 - Never What is the last grade level you completed in school?: 12th grade  Interpreter Needed?: No  Information entered by :: Tiffany Hill,LPN   Activities of Daily Living In your present state of health, do you have any difficulty performing the following activities: 05/23/2018  Hearing? N  Vision? Y  Comment has cataract surgery scheduled for right eye   Difficulty concentrating or making decisions? N  Walking or climbing stairs? N  Dressing or bathing? N  Doing errands, shopping? N  Preparing Food and eating ? N  Using the Toilet? N  In the past six months,  have you accidently leaked urine? N  Do you have problems with loss of bowel control? N  Managing your Medications? N  Managing your Finances? N  Housekeeping or managing your Housekeeping? N  Some recent data might be hidden     Immunizations and Health Maintenance Immunization History  Administered Date(s) Administered  . Influenza, High Dose Seasonal PF 10/30/2016  . Influenza,inj,Quad PF,6+ Mos 09/27/2015  . Influenza-Unspecified 08/18/2014, 10/17/2017  . Pneumococcal Conjugate-13 12/28/2014  . Pneumococcal-Unspecified 12/30/2012  . Td 05/23/2005, 03/14/2016  . Zoster 03/11/2014   Health Maintenance Due  Topic Date Due  . COLONOSCOPY  10/24/2016  . FOOT EXAM  07/12/2017  . URINE MICROALBUMIN  10/30/2017    Patient Care Team: [name], MD as PCP - General (Family Medicine) Kem Parkinson, MD (Ophthalmology)  Indicate any recent Medical Services you may have received from other than [facility] providers in the past year (date may be approximate).     Assessment:   This is a routine wellness examination for [place].  Hearing/Vision screen Vision Screening Comments: Goes to [facility] annually  Dietary issues and exercise activities discussed: Current Exercise Habits: The patient does not participate in regular exercise at present, Exercise limited by: None identified  Goals    . DIET - INCREASE WATER INTAKE     Recommend drinking at least 6-8 glasses of water a day       Depression Screen PHQ 2/9 Scores 05/23/2018 01/29/2018 10/16/2017 07/10/2017 04/10/2017 11/22/2015  PHQ - 2 Score 0 0 0 0 0 0  PHQ- 9 Score - 1 - - - -    Fall Risk Fall Risk  05/23/2018 01/29/2018 10/16/2017 07/10/2017 04/10/2017  Falls in the past year? _0     Is the patient's home free of loose throw rugs in walkways, pet beds, electrical cords, etc?   yes      Grab bars in the bathroom? no      Handrails on the stairs?   yes      Adequate lighting?   yes  Timed Get Up  and Go Performed Completed in 8 seconds with no use of assistive devices, steady gait. No intervention needed at this time.   Cognitive Function:     6CIT Screen 05/23/2018  What Year? 0 points  What month? 0 points  What time? 0 points  Count back from 20 0 points  Months in reverse 0 points  Repeat phrase 2 points  Total Score 2    Screening Tests Health Maintenance  Topic Date Due  . COLONOSCOPY  10/24/2016  . FOOT EXAM  07/12/2017  . URINE MICROALBUMIN  10/30/2017  . INFLUENZA VACCINE  07/18/2018  . HEMOGLOBIN A1C  07/29/2018  . OPHTHALMOLOGY EXAM  04/02/2019  . MAMMOGRAM  04/01/2020  . TETANUS/TDAP  03/14/2026  . DEXA SCAN  Completed  . Hepatitis C Screening  Completed  . PNA vac Low Risk Adult  Completed    Qualifies for Shingles Vaccine? Yes, discussed shingrix vaccine   Cancer Screenings: Lung: Low Dose CT Chest recommended if Age 17-80 years, 30 pack-year currently smoking OR have quit w/in 15years. Patient does not qualify. Breast: Up to date on Mammogram? Yes  04/01/2018 Up to date of Bone Density/Dexa? Yes 03/27/2016 Colorectal: completed 10/25/2015, cologuard ordered  Additional Screenings:  Hepatitis C Screening:  Completed 03/14/2016     Plan:    I have personally reviewed and addressed the Medicare Annual Wellness questionnaire and have noted the following in the patient's chart:  A. Medical and social history B. Use of alcohol, tobacco or illicit drugs  C. Current medications and supplements D. Functional ability and status E.  Nutritional status F.  Physical activity G. Advance directives H. List of other physicians I.  Hospitalizations, surgeries, and ER visits in previous 12 months J.  [place] such as hearing and vision if needed, cognitive and depression L. Referrals and appointments   In addition, I have reviewed and discussed with patient certain preventive protocols, quality metrics, and best practice recommendations. A written  personalized care plan for preventive services  as well as general preventive health recommendations were provided to patient.   Signed,  Tyler Aas, LPN Nurse Health Advisor   Nurse Notes: due for diabetic foot exam - cpe on 6/13, patient complaining of right arm pain for about a week 10/10pain with no relief with aleve or aspercreme. Has cpe on 6/13  I, as the supervising physician, have reviewed the nurse health advisor's Medicare wellness visit note for this patient and concur with the findings and recommendations listed above. Golden Pop, MD

## 2018-05-24 LAB — CBC WITH DIFFERENTIAL/PLATELET
Basophils Absolute: 0 10*3/uL (ref 0.0–0.2)
Basos: 1 %
EOS (ABSOLUTE): 0.1 10*3/uL (ref 0.0–0.4)
Eos: 2 %
Hematocrit: 39.3 % (ref 34.0–46.6)
Hemoglobin: 13.6 g/dL (ref 11.1–15.9)
Immature Grans (Abs): 0 10*3/uL (ref 0.0–0.1)
Immature Granulocytes: 0 %
Lymphocytes Absolute: 1.7 10*3/uL (ref 0.7–3.1)
Lymphs: 22 %
MCH: 27.4 pg (ref 26.6–33.0)
MCHC: 34.6 g/dL (ref 31.5–35.7)
MCV: 79 fL (ref 79–97)
Monocytes Absolute: 0.5 10*3/uL (ref 0.1–0.9)
Monocytes: 6 %
Neutrophils Absolute: 5.3 10*3/uL (ref 1.4–7.0)
Neutrophils: 69 %
Platelets: 204 10*3/uL (ref 150–450)
RBC: 4.96 x10E6/uL (ref 3.77–5.28)
RDW: 15.1 % (ref 12.3–15.4)
WBC: 7.7 10*3/uL (ref 3.4–10.8)

## 2018-05-24 LAB — COMPREHENSIVE METABOLIC PANEL
ALT: 16 IU/L (ref 0–32)
AST: 16 IU/L (ref 0–40)
Albumin/Globulin Ratio: 1.7 (ref 1.2–2.2)
Albumin: 4 g/dL (ref 3.5–4.8)
Alkaline Phosphatase: 84 IU/L (ref 39–117)
BUN/Creatinine Ratio: 18 (ref 12–28)
BUN: 17 mg/dL (ref 8–27)
Bilirubin Total: 0.2 mg/dL (ref 0.0–1.2)
CO2: 21 mmol/L (ref 20–29)
Calcium: 9.2 mg/dL (ref 8.7–10.3)
Chloride: 104 mmol/L (ref 96–106)
Creatinine, Ser: 0.93 mg/dL (ref 0.57–1.00)
GFR calc Af Amer: 72 mL/min/{1.73_m2} (ref >59)
GFR calc non Af Amer: 62 mL/min/{1.73_m2} (ref >59)
Globulin, Total: 2.3 g/dL (ref 1.5–4.5)
Glucose: 233 mg/dL — ABNORMAL HIGH (ref 65–99)
Potassium: 4.3 mmol/L (ref 3.5–5.2)
Sodium: 142 mmol/L (ref 134–144)
Total Protein: 6.3 g/dL (ref 6.0–8.5)

## 2018-05-24 LAB — LIPID PANEL W/O CHOL/HDL RATIO
Cholesterol, Total: 127 mg/dL (ref 100–199)
HDL: 35 mg/dL — ABNORMAL LOW (ref >39)
LDL Calculated: 70 mg/dL (ref 0–99)
Triglycerides: 108 mg/dL (ref 0–149)
VLDL Cholesterol Cal: 22 mg/dL (ref 5–40)

## 2018-05-24 LAB — TSH: TSH: 1.86 u[IU]/mL (ref 0.450–4.500)

## 2018-05-27 ENCOUNTER — Encounter: Payer: Self-pay | Admitting: Family Medicine

## 2018-05-30 ENCOUNTER — Ambulatory Visit (INDEPENDENT_AMBULATORY_CARE_PROVIDER_SITE_OTHER): Payer: Medicare Other | Admitting: Family Medicine

## 2018-05-30 ENCOUNTER — Other Ambulatory Visit: Payer: Self-pay

## 2018-05-30 ENCOUNTER — Encounter: Payer: Self-pay | Admitting: Family Medicine

## 2018-05-30 VITALS — BP 135/69 | HR 79 | Temp 97.4°F | Ht 60.0 in | Wt 204.6 lb

## 2018-05-30 DIAGNOSIS — M7550 Bursitis of unspecified shoulder: Secondary | ICD-10-CM | POA: Insufficient documentation

## 2018-05-30 DIAGNOSIS — Z0001 Encounter for general adult medical examination with abnormal findings: Secondary | ICD-10-CM

## 2018-05-30 DIAGNOSIS — I10 Essential (primary) hypertension: Secondary | ICD-10-CM | POA: Diagnosis not present

## 2018-05-30 DIAGNOSIS — E7800 Pure hypercholesterolemia, unspecified: Secondary | ICD-10-CM

## 2018-05-30 DIAGNOSIS — E1142 Type 2 diabetes mellitus with diabetic polyneuropathy: Secondary | ICD-10-CM

## 2018-05-30 DIAGNOSIS — E119 Type 2 diabetes mellitus without complications: Secondary | ICD-10-CM | POA: Diagnosis not present

## 2018-05-30 DIAGNOSIS — M7551 Bursitis of right shoulder: Secondary | ICD-10-CM | POA: Diagnosis not present

## 2018-05-30 DIAGNOSIS — D692 Other nonthrombocytopenic purpura: Secondary | ICD-10-CM | POA: Diagnosis not present

## 2018-05-30 DIAGNOSIS — Z7189 Other specified counseling: Secondary | ICD-10-CM

## 2018-05-30 LAB — BAYER DCA HB A1C WAIVED: HB A1C (BAYER DCA - WAIVED): 8.1 % — ABNORMAL HIGH (ref <7.0)

## 2018-05-30 MED ORDER — INSULIN LISPRO 100 UNIT/ML (KWIKPEN): 25.0000 [IU] | PEN_INJECTOR | Freq: Two times a day (BID) | SUBCUTANEOUS | 12 refills | Status: DC

## 2018-05-30 MED ORDER — AMLODIPINE BESYLATE 10 MG PO TABS: 10.0000 mg | ORAL_TABLET | Freq: Every day | ORAL | 4 refills | Status: DC

## 2018-05-30 MED ORDER — INSULIN GLARGINE-LIXISENATIDE 100-33 UNT-MCG/ML ~~LOC~~ SOPN: 60.0000 [IU] | PEN_INJECTOR | Freq: Every day | SUBCUTANEOUS | 12 refills | Status: DC

## 2018-05-30 MED ORDER — SPIRONOLACTONE 25 MG PO TABS: 25.0000 mg | ORAL_TABLET | Freq: Every day | ORAL | 4 refills | Status: DC

## 2018-05-30 MED ORDER — GLUCOSE BLOOD VI STRP: ORAL_STRIP | 12 refills | Status: DC

## 2018-05-30 MED ORDER — ATENOLOL 100 MG PO TABS: 100.0000 mg | ORAL_TABLET | Freq: Every day | ORAL | 4 refills | Status: DC

## 2018-05-30 MED ORDER — LOVASTATIN 40 MG PO TABS: 40.0000 mg | ORAL_TABLET | Freq: Every day | ORAL | 4 refills | Status: DC

## 2018-05-30 MED ORDER — SAXAGLIPTIN HCL 5 MG PO TABS: 5.0000 mg | ORAL_TABLET | Freq: Every day | ORAL | 4 refills | Status: DC

## 2018-05-30 NOTE — Assessment & Plan Note (Signed)
stable °

## 2018-05-30 NOTE — Assessment & Plan Note (Signed)
The current medical regimen is effective;  continue present plan and medications.  

## 2018-05-30 NOTE — Assessment & Plan Note (Signed)
Discuss shoulder pain patient will quit lifting her granddaughter.  And will refer to orthopedics.

## 2018-05-30 NOTE — Assessment & Plan Note (Signed)
Discussed diabetes care and treatment need for better control with developing neuropathy.  Hemoglobin A1c 8.1 today patient will do better with diet nutrition exercise.

## 2018-05-30 NOTE — Progress Notes (Signed)
BP 135/69   Pulse 79   Temp (!) 97.4 F (36.3 C) (Oral)   Ht 5' (1.524 m)   Wt 204 lb 9.6 oz (92.8 kg)   SpO2 97%   BMI 39.96 kg/m    Subjective:    [name], female    DOB: 21-Sep-1946, 72 y.o.   MRN: [id number]  HPI: Tami Cummings is a 72 y.o. female  Chief Complaint  Patient presents with  . Annual Exam    pt had wellness exam 05/23/18  . Shoulder Pain    R shoulder for about 2 weeks  Patient with right shoulder limited range of motion and marked pain when trying to lift for about 2 weeks now no known trauma irritation just woke up in the morning and her shoulder was painful. Patient also doing well with blood pressure medications taking blood pressure without problems or issues. Taking cholesterol medicine also without problems. Diabetes seems to be doing well has taken her insulin without problems rare low blood sugar spells. Patient has developing some numbness of both hands and feet.  Discussed neuropathy with patient and she understands the #1 way to help this is to quit eating sweets. Relevant past medical, surgical, family and social history reviewed and updated as indicated. Interim medical history since our last visit reviewed. Allergies and medications reviewed and updated.  Review of Systems  Constitutional: Negative.   HENT: Negative.   Eyes: Negative.   Respiratory: Negative.   Cardiovascular: Negative.   Gastrointestinal: Negative.   Endocrine: Negative.   Genitourinary: Negative.   Musculoskeletal: Negative.   Skin: Negative.   Allergic/Immunologic: Negative.   Neurological: Negative.   Hematological: Negative.   Psychiatric/Behavioral: Negative.     Per HPI unless specifically indicated above     Objective:    BP 135/69   Pulse 79   Temp (!) 97.4 F (36.3 C) (Oral)   Ht 5' (1.524 m)   Wt 204 lb 9.6 oz (92.8 kg)   SpO2 97%   BMI 39.96 kg/m   Wt Readings from Last 3 Encounters:  05/30/18 204 lb 9.6 oz (92.8  kg)  05/23/18 205 lb (93 kg)  01/29/18 204 lb (92.5 kg)    Physical Exam  Constitutional: She is oriented to person, place, and time. She appears well-developed and well-nourished.  HENT:  Head: Normocephalic and atraumatic.  Right Ear: External ear normal.  Left Ear: External ear normal.  Nose: Nose normal.  Mouth/Throat: Oropharynx is clear and moist.  Eyes: Pupils are equal, round, and reactive to light. Conjunctivae and EOM are normal.  Neck: Normal range of motion. Neck supple. Carotid bruit is not present.  Cardiovascular: Normal rate, regular rhythm and normal heart sounds.  No murmur heard. Pulmonary/Chest: Effort normal and breath sounds normal. She exhibits no mass. Right breast exhibits no mass, no skin change and no tenderness. Left breast exhibits no mass, no skin change and no tenderness. Breasts are symmetrical.  Abdominal: Soft. Bowel sounds are normal. There is no hepatosplenomegaly.  Musculoskeletal: Normal range of motion.  Neurological: She is alert and oriented to person, place, and time.  Skin: No rash noted.  Psychiatric: She has a normal mood and affect. Her behavior is normal. Judgment and thought content normal.    Results for orders placed or performed in visit on 05/23/18  Microscopic Examination  Result Value Ref Range   WBC, UA 0-5 0 - 5 /hpf   RBC, UA 0-2 0 - 2 /hpf  Epithelial Cells (non renal) 0-10 0 - 10 /hpf   Bacteria, UA None seen None seen/Few  CBC with Differential  Result Value Ref Range   WBC 7.7 3.4 - 10.8 x10E3/uL   RBC 4.96 3.77 - 5.28 x10E6/uL   Hemoglobin 13.6 11.1 - 15.9 g/dL   Hematocrit 39.3 34.0 - 46.6 %   MCV 79 79 - 97 fL   MCH 27.4 26.6 - 33.0 pg   MCHC 34.6 31.5 - 35.7 g/dL   RDW 15.1 12.3 - 15.4 %   Platelets 204 150 - 450 x10E3/uL   Neutrophils 69 Not Estab. %   Lymphs 22 Not Estab. %   Monocytes 6 Not Estab. %   Eos 2 Not Estab. %   Basos 1 Not Estab. %   Neutrophils Absolute 5.3 1.4 - 7.0 x10E3/uL   Lymphocytes  Absolute 1.7 0.7 - 3.1 x10E3/uL   Monocytes Absolute 0.5 0.1 - 0.9 x10E3/uL   EOS (ABSOLUTE) 0.1 0.0 - 0.4 x10E3/uL   Basophils Absolute 0.0 0.0 - 0.2 x10E3/uL   Immature Granulocytes 0 Not Estab. %   Immature Grans (Abs) 0.0 0.0 - 0.1 x10E3/uL  Comp Met (CMET)  Result Value Ref Range   Glucose 233 (H) 65 - 99 mg/dL   BUN 17 8 - 27 mg/dL   Creatinine, Ser 0.93 0.57 - 1.00 mg/dL   GFR calc non Af Amer 62 >59 mL/min/1.73   GFR calc Af Amer 72 >59 mL/min/1.73   BUN/Creatinine Ratio 18 12 - 28   Sodium 142 134 - 144 mmol/L   Potassium 4.3 3.5 - 5.2 mmol/L   Chloride 104 96 - 106 mmol/L   CO2 21 20 - 29 mmol/L   Calcium 9.2 8.7 - 10.3 mg/dL   Total Protein 6.3 6.0 - 8.5 g/dL   Albumin 4.0 3.5 - 4.8 g/dL   Globulin, Total 2.3 1.5 - 4.5 g/dL   Albumin/Globulin Ratio 1.7 1.2 - 2.2   Bilirubin Total 0.2 0.0 - 1.2 mg/dL   Alkaline Phosphatase 84 39 - 117 IU/L   AST 16 0 - 40 IU/L   ALT 16 0 - 32 IU/L  Lipid Panel w/o Chol/HDL Ratio  Result Value Ref Range   Cholesterol, Total 127 100 - 199 mg/dL   Triglycerides 108 0 - 149 mg/dL   HDL 35 (L) >39 mg/dL   VLDL Cholesterol Cal 22 5 - 40 mg/dL   LDL Calculated 70 0 - 99 mg/dL  Urinalysis, Routine w reflex microscopic  Result Value Ref Range   Specific Gravity, UA >1.030 (H) 1.005 - 1.030   pH, UA 5.0 5.0 - 7.5   Color, UA Yellow Yellow   Appearance Ur Hazy (A) Clear   Leukocytes, UA Trace (A) Negative   Protein, UA Trace (A) Negative/Trace   Glucose, UA Negative Negative   Ketones, UA Negative Negative   RBC, UA Negative Negative   Bilirubin, UA Negative Negative   Urobilinogen, Ur 0.2 0.2 - 1.0 mg/dL   Nitrite, UA Negative Negative   Microscopic Examination See below:   TSH  Result Value Ref Range   TSH 1.860 0.450 - 4.500 uIU/mL      Assessment & Plan:   Problem List Items Addressed This Visit      Cardiovascular and Mediastinum   Hypertension    The current medical regimen is effective;  continue present plan and  medications.       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  Hemoglobin A1c 8.1 today patient will do better with diet nutrition exercise.      Relevant Medications   saxagliptin HCl (ONGLYZA) 5 MG TABS tablet   lovastatin (MEVACOR) 40 MG tablet   insulin lispro (HUMALOG KWIKPEN) 100 UNIT/ML KiwkPen   Insulin Glargine-Lixisenatide (SOLIQUA) 100-33 UNT-MCG/ML SOPN     Musculoskeletal and Integument   Acute shoulder bursitis    Discuss shoulder pain patient will quit lifting her granddaughter.  And will refer to orthopedics.      Relevant Orders   Ambulatory referral to Orthopedic Surgery     Other   Hyperlipidemia    The current medical regimen is effective;  continue present plan and medications.       Relevant Medications   spironolactone (ALDACTONE) 25 MG tablet   saxagliptin HCl (ONGLYZA) 5 MG TABS tablet   lovastatin (MEVACOR) 40 MG tablet   atenolol (TENORMIN) 100 MG tablet   amLODipine (NORVASC) 10 MG tablet   Advance care planning    A voluntary discussion about advanced care planning including explanation and discussion of advanced directives was extentively discussed with the patient.  Explained about the healthcare proxy and living will was reviewed and packet with forms with expiration of how to fill them out was given.  Time spent: Encounter 16+ min individuals present: Patient       Other Visit Diagnoses    Diabetes mellitus without complication ([place])    -   Primary   Relevant Medications   saxagliptin HCl (ONGLYZA) 5 MG TABS tablet   lovastatin (MEVACOR) 40 MG tablet   insulin lispro (HUMALOG KWIKPEN) 100 UNIT/ML KiwkPen   Insulin Glargine-Lixisenatide (SOLIQUA) 100-33 UNT-MCG/ML SOPN   Other Relevant Orders   Bayer DCA Hb A1c Waived   Essential hypertension, benign       Relevant Medications   spironolactone (ALDACTONE) 25 MG tablet   lovastatin (MEVACOR) 40 MG tablet   atenolol (TENORMIN) 100 MG tablet   amLODipine (NORVASC) 10 MG tablet       Follow up plan: Return in about 3 months (around 08/30/2018) for Hemoglobin A1c.

## 2018-05-30 NOTE — Assessment & Plan Note (Signed)
A voluntary discussion about advanced care planning including explanation and discussion of advanced directives was extentively discussed with the patient.  Explained about the healthcare proxy and living will was reviewed and packet with forms with expiration of how to fill them out was given.  Time spent: Encounter 16+ min individuals present: Patient 

## 2018-05-31 NOTE — Discharge Instructions (Signed)

## 2018-06-03 ENCOUNTER — Ambulatory Visit
Admission: RE | Admit: 2018-06-03 | Discharge: 2018-06-03 | Disposition: A | Discharge status: Home / Self Care | Payer: Medicare Other | Source: Ambulatory Visit | Attending: Ophthalmology | Admitting: Ophthalmology

## 2018-06-03 ENCOUNTER — Encounter
Admission: RE | Disposition: A | Discharge status: Home / Self Care | Payer: Self-pay | Source: Ambulatory Visit | Attending: Ophthalmology

## 2018-06-03 ENCOUNTER — Ambulatory Visit: Payer: Medicare Other | Admitting: Anesthesiology

## 2018-06-03 DIAGNOSIS — Z9049 Acquired absence of other specified parts of digestive tract: Secondary | ICD-10-CM | POA: Diagnosis not present

## 2018-06-03 DIAGNOSIS — Z888 Allergy status to other drugs, medicaments and biological substances status: Secondary | ICD-10-CM | POA: Insufficient documentation

## 2018-06-03 DIAGNOSIS — J449 Chronic obstructive pulmonary disease, unspecified: Secondary | ICD-10-CM | POA: Insufficient documentation

## 2018-06-03 DIAGNOSIS — H2511 Age-related nuclear cataract, right eye: Secondary | ICD-10-CM | POA: Insufficient documentation

## 2018-06-03 DIAGNOSIS — E1140 Type 2 diabetes mellitus with diabetic neuropathy, unspecified: Secondary | ICD-10-CM | POA: Insufficient documentation

## 2018-06-03 DIAGNOSIS — E7800 Pure hypercholesterolemia, unspecified: Secondary | ICD-10-CM | POA: Insufficient documentation

## 2018-06-03 DIAGNOSIS — Z87891 Personal history of nicotine dependence: Secondary | ICD-10-CM | POA: Diagnosis not present

## 2018-06-03 DIAGNOSIS — I10 Essential (primary) hypertension: Secondary | ICD-10-CM | POA: Insufficient documentation

## 2018-06-03 DIAGNOSIS — M1990 Unspecified osteoarthritis, unspecified site: Secondary | ICD-10-CM | POA: Diagnosis not present

## 2018-06-03 DIAGNOSIS — E1136 Type 2 diabetes mellitus with diabetic cataract: Secondary | ICD-10-CM | POA: Insufficient documentation

## 2018-06-03 DIAGNOSIS — M7989 Other specified soft tissue disorders: Secondary | ICD-10-CM | POA: Insufficient documentation

## 2018-06-03 DIAGNOSIS — H25811 Combined forms of age-related cataract, right eye: Secondary | ICD-10-CM | POA: Diagnosis not present

## 2018-06-03 HISTORY — DX: Pain in right shoulder: M25.511

## 2018-06-03 HISTORY — PX: CATARACT EXTRACTION W/PHACO: SHX586

## 2018-06-03 HISTORY — DX: Presence of dental prosthetic device (complete) (partial): Z97.2

## 2018-06-03 HISTORY — DX: Myoneural disorder, unspecified: G70.9

## 2018-06-03 HISTORY — DX: Dyspnea, unspecified: R06.00

## 2018-06-03 LAB — GLUCOSE, CAPILLARY
Glucose-Capillary: 233 mg/dL — ABNORMAL HIGH (ref 65–99)
Glucose-Capillary: 155 mg/dL — ABNORMAL HIGH (ref 65–99)

## 2018-06-03 SURGERY — PHACOEMULSIFICATION, CATARACT, WITH IOL INSERTION
Anesthesia: Monitor Anesthesia Care | Site: Eye | Laterality: Right | Wound class: "Clean "

## 2018-06-03 MED ORDER — INSULIN REGULAR HUMAN 100 UNIT/ML IJ SOLN
5.0000 [IU] | Freq: Once | INTRAMUSCULAR | Status: AC
  Administered 2018-06-03: 5 [IU] via INTRAVENOUS

## 2018-06-03 MED ORDER — EPINEPHRINE PF 1 MG/ML IJ SOLN
INTRAOCULAR | Status: DC | PRN
  Administered 2018-06-03: 98 mL via OPHTHALMIC

## 2018-06-03 MED ORDER — LACTATED RINGERS IV SOLN: 10.0000 mL/h | INTRAVENOUS | Status: DC

## 2018-06-03 MED ORDER — ARMC OPHTHALMIC DILATING DROPS
1.0000 "application " | OPHTHALMIC | Status: DC | PRN
  Administered 2018-06-03 (×3): 1 via OPHTHALMIC

## 2018-06-03 MED ORDER — SODIUM HYALURONATE 23 MG/ML IO SOLN
INTRAOCULAR | Status: DC | PRN
  Administered 2018-06-03: 0.6 mL via INTRAOCULAR

## 2018-06-03 MED ORDER — SODIUM HYALURONATE 10 MG/ML IO SOLN
INTRAOCULAR | Status: DC | PRN
  Administered 2018-06-03: 0.55 mL via INTRAOCULAR

## 2018-06-03 MED ORDER — ONDANSETRON HCL 4 MG/2ML IJ SOLN: 4.0000 mg | Freq: Once | INTRAMUSCULAR | Status: DC | PRN

## 2018-06-03 MED ORDER — MIDAZOLAM HCL 2 MG/2ML IJ SOLN
INTRAMUSCULAR | Status: DC | PRN
  Administered 2018-06-03: 1 mg via INTRAVENOUS

## 2018-06-03 MED ORDER — MOXIFLOXACIN HCL 0.5 % OP SOLN
OPHTHALMIC | Status: DC | PRN
  Administered 2018-06-03: 0.2 mL via OPHTHALMIC

## 2018-06-03 MED ORDER — FENTANYL CITRATE (PF) 100 MCG/2ML IJ SOLN
INTRAMUSCULAR | Status: DC | PRN
  Administered 2018-06-03: 50 ug via INTRAVENOUS

## 2018-06-03 MED ORDER — LIDOCAINE HCL (PF) 2 % IJ SOLN
INTRAMUSCULAR | Status: DC | PRN
  Administered 2018-06-03: 1 mL via INTRAOCULAR

## 2018-06-03 SURGICAL SUPPLY — 18 items
CANNULA ANT/CHMB 27G (MISCELLANEOUS) ×1 IMPLANT
CANNULA ANT/CHMB 27GA (MISCELLANEOUS) ×2 IMPLANT
DISSECTOR HYDRO NUCLEUS 50X22 (MISCELLANEOUS) ×2 IMPLANT
GLOVE BIO SURGEON STRL SZ8 (GLOVE) ×2 IMPLANT
GLOVE SURG LX 7.5 STRW (GLOVE) ×1
GLOVE SURG LX STRL 7.5 STRW (GLOVE) ×1 IMPLANT
GOWN STRL REUS W/ TWL LRG LVL3 (GOWN DISPOSABLE) ×2 IMPLANT
GOWN STRL REUS W/TWL LRG LVL3 (GOWN DISPOSABLE) ×2
LENS IOL ACRSF IQ ULTRA 23.0 (Intraocular Lens) IMPLANT
LENS IOL ACRYSOF IQ 23.0 (Intraocular Lens) ×2 IMPLANT
MARKER SKIN DUAL TIP RULER LAB (MISCELLANEOUS) ×2 IMPLANT
PACK CATARACT (MISCELLANEOUS) ×2 IMPLANT
PACK DR. KING ARMS (PACKS) ×2 IMPLANT
PACK EYE AFTER SURG (MISCELLANEOUS) ×2 IMPLANT
SYR 3ML LL SCALE MARK (SYRINGE) ×2 IMPLANT
SYR TB 1ML LUER SLIP (SYRINGE) ×2 IMPLANT
WATER STERILE IRR 500ML POUR (IV SOLUTION) ×2 IMPLANT
WIPE NON LINTING 3.25X3.25 (MISCELLANEOUS) ×2 IMPLANT

## 2018-06-03 NOTE — Transfer of Care (Signed)
Immediate Anesthesia Transfer of Care Note  Patient: Tami Cummings  Procedure(s) Performed: CATARACT EXTRACTION PHACO AND INTRAOCULAR LENS PLACEMENT (IOC) RIGHT DIABETES (Right Eye)  Patient Location: PACU  Anesthesia Type: MAC  Level of Consciousness: awake, alert  and patient cooperative  Airway and Oxygen Therapy: Patient Spontanous Breathing and Patient connected to supplemental oxygen  Post-op Assessment: Post-op Vital signs reviewed, Patient's Cardiovascular Status Stable, Respiratory Function Stable, Patent Airway and No signs of Nausea or vomiting  Post-op Vital Signs: Reviewed and stable  Complications: No apparent anesthesia complications

## 2018-06-03 NOTE — Anesthesia Postprocedure Evaluation (Signed)
Anesthesia Post Note  Patient: Tami Cummings  Procedure(s) Performed: CATARACT EXTRACTION PHACO AND INTRAOCULAR LENS PLACEMENT (I[facility]) RIGHT DIABETES (Right Eye)  Patient location during evaluation: PACU Anesthesia Type: MAC Level of consciousness: awake and alert, oriented and patient cooperative Pain management: pain level controlled Vital Signs Assessment: post-procedure vital signs reviewed and stable Respiratory status: spontaneous breathing, nonlabored ventilation and respiratory function stable Cardiovascular status: blood pressure returned to baseline and stable Postop Assessment: adequate PO intake Anesthetic complications: no    Darrin Nipper

## 2018-06-03 NOTE — Anesthesia Preprocedure Evaluation (Signed)
Anesthesia Evaluation  Patient identified by MRN, date of birth, ID band Patient awake    Reviewed: Allergy & Precautions, NPO status , Patient's Chart, lab work & pertinent test results  History of Anesthesia Complications Negative for: history of anesthetic complications  Airway Mallampati: III  TM Distance: >3 FB Neck ROM: Full    Dental  (+) Lower Dentures, Upper Dentures   Pulmonary COPD, former smoker (quit 2014),    Pulmonary exam normal breath sounds clear to auscultation       Cardiovascular hypertension, Normal cardiovascular exam Rhythm:Regular Rate:Normal     Neuro/Psych  Neuromuscular disease (neuropathy)    GI/Hepatic negative GI ROS,   Endo/Other  diabetes  Renal/GU negative Renal ROS     Musculoskeletal  (+) Arthritis , Osteoarthritis,    Abdominal   Peds  Hematology negative hematology ROS (+)   Anesthesia Other Findings   Reproductive/Obstetrics                             Anesthesia Physical Anesthesia Plan  ASA: III  Anesthesia Plan: MAC   Post-op Pain Management:    Induction: Intravenous  PONV Risk Score and Plan: 2 and TIVA and Midazolam  Airway Management Planned: Natural Airway  Additional Equipment:   Intra-op Plan:   Post-operative Plan:   Informed Consent: I have reviewed the patients History and Physical, chart, labs and discussed the procedure including the risks, benefits and alternatives for the proposed anesthesia with the patient or authorized representative who has indicated his/her understanding and acceptance.     Plan Discussed with: CRNA  Anesthesia Plan Comments:         Anesthesia Quick Evaluation

## 2018-06-03 NOTE — Op Note (Signed)
OPERATIVE NOTE  Tami Cummings [id number] 06/03/2018   PREOPERATIVE DIAGNOSIS:  Nuclear sclerotic cataract right eye.  H25.11   POSTOPERATIVE DIAGNOSIS:    Nuclear sclerotic cataract right eye.     PROCEDURE:  Phacoemusification with posterior chamber intraocular lens placement of the right eye   LENS:   Implant Name Type Inv. Item Serial No. Manufacturer Lot No. LRB No. Used  LENS IOL ACRYSOF IQ 23.0 - [id number] Intraocular Lens LENS IOL ACRYSOF IQ 23.0 [id number] [employer]  Right 1       [id number] +23.0   ULTRASOUND TIME: 1 minutes 15 seconds.  CDE 11.42   SURGEON:  Benay Pillow, MD, MPH  ANESTHESIOLOGIST: Anesthesiologist: Darrin Nipper, MD CRNA: Mayme Genta, CRNA   ANESTHESIA:  Topical with tetracaine drops augmented with 1% preservative-free intracameral lidocaine.  ESTIMATED BLOOD LOSS: less than 1 mL.   COMPLICATIONS:  None.   DESCRIPTION OF PROCEDURE:  The patient was identified in the holding room and transported to the operating room and placed in the supine position under the operating microscope.  The right eye was identified as the operative eye and it was prepped and draped in the usual sterile ophthalmic fashion.   A 1.0 millimeter clear-corneal paracentesis was made at the 10:30 position. 0.5 ml of preservative-free 1% lidocaine with epinephrine was injected into the anterior chamber.  The anterior chamber was filled with Healon 5 viscoelastic.  A 2.4 millimeter keratome was used to make a near-clear corneal incision at the 8:00 position.  A curvilinear capsulorrhexis was made with a cystotome and capsulorrhexis forceps.  Balanced salt solution was used to hydrodissect and hydrodelineate the nucleus.   Phacoemulsification was then used in stop and chop fashion to remove the lens nucleus and epinucleus.  The remaining cortex was then removed using the irrigation and aspiration handpiece. Healon was then placed into the capsular bag to distend it for lens  placement.  A lens was then injected into the capsular bag.  The remaining viscoelastic was aspirated.   Wounds were hydrated with balanced salt solution.  The anterior chamber was inflated to a physiologic pressure with balanced salt solution.   Intracameral vigamox 0.1 mL undiluted was injected into the eye and a drop placed onto the ocular surface.  No wound leaks were noted.  The patient was taken to the recovery room in stable condition without complications of anesthesia or surgery  Benay Pillow 06/03/2018, 8:31 AM

## 2018-06-03 NOTE — Anesthesia Procedure Notes (Signed)
Procedure Name: MAC Performed by: Jakiah Goree, CRNA Pre-anesthesia Checklist: Patient identified, Emergency Drugs available, Suction available, Timeout performed and Patient being monitored Patient Re-evaluated:Patient Re-evaluated prior to induction Oxygen Delivery Method: Nasal cannula Placement Confirmation: positive ETCO2       

## 2018-06-03 NOTE — H&P (Signed)
The History and Physical notes are on paper, have been signed, and are to be scanned.   I have examined the patient and there are no changes to the H&P.   Tami Cummings 06/03/2018 7:53 AM

## 2018-06-04 ENCOUNTER — Encounter: Payer: Self-pay | Admitting: Ophthalmology

## 2018-06-07 DIAGNOSIS — M7501 Adhesive capsulitis of right shoulder: Secondary | ICD-10-CM | POA: Diagnosis not present

## 2018-06-18 DIAGNOSIS — M7501 Adhesive capsulitis of right shoulder: Secondary | ICD-10-CM | POA: Diagnosis not present

## 2018-07-15 DIAGNOSIS — R600 Localized edema: Secondary | ICD-10-CM | POA: Diagnosis not present

## 2018-07-15 DIAGNOSIS — M25611 Stiffness of right shoulder, not elsewhere classified: Secondary | ICD-10-CM | POA: Diagnosis not present

## 2018-07-15 DIAGNOSIS — M25511 Pain in right shoulder: Secondary | ICD-10-CM | POA: Diagnosis not present

## 2018-07-17 DIAGNOSIS — M25611 Stiffness of right shoulder, not elsewhere classified: Secondary | ICD-10-CM | POA: Diagnosis not present

## 2018-07-17 DIAGNOSIS — R600 Localized edema: Secondary | ICD-10-CM | POA: Diagnosis not present

## 2018-07-17 DIAGNOSIS — M25511 Pain in right shoulder: Secondary | ICD-10-CM | POA: Diagnosis not present

## 2018-07-23 DIAGNOSIS — M7501 Adhesive capsulitis of right shoulder: Secondary | ICD-10-CM | POA: Diagnosis not present

## 2018-07-24 DIAGNOSIS — M25611 Stiffness of right shoulder, not elsewhere classified: Secondary | ICD-10-CM | POA: Diagnosis not present

## 2018-07-24 DIAGNOSIS — M25511 Pain in right shoulder: Secondary | ICD-10-CM | POA: Diagnosis not present

## 2018-07-31 DIAGNOSIS — M25511 Pain in right shoulder: Secondary | ICD-10-CM | POA: Diagnosis not present

## 2018-07-31 DIAGNOSIS — M25611 Stiffness of right shoulder, not elsewhere classified: Secondary | ICD-10-CM | POA: Diagnosis not present

## 2018-08-02 LAB — FECAL OCCULT BLOOD, IMMUNOCHEMICAL: IFOBT: NEGATIVE

## 2018-08-05 DIAGNOSIS — M25611 Stiffness of right shoulder, not elsewhere classified: Secondary | ICD-10-CM | POA: Diagnosis not present

## 2018-08-05 DIAGNOSIS — M25511 Pain in right shoulder: Secondary | ICD-10-CM | POA: Diagnosis not present

## 2018-08-07 DIAGNOSIS — M25611 Stiffness of right shoulder, not elsewhere classified: Secondary | ICD-10-CM | POA: Diagnosis not present

## 2018-08-07 DIAGNOSIS — M25511 Pain in right shoulder: Secondary | ICD-10-CM | POA: Diagnosis not present

## 2018-08-14 DIAGNOSIS — M25511 Pain in right shoulder: Secondary | ICD-10-CM | POA: Diagnosis not present

## 2018-08-14 DIAGNOSIS — M25611 Stiffness of right shoulder, not elsewhere classified: Secondary | ICD-10-CM | POA: Diagnosis not present

## 2018-08-16 ENCOUNTER — Other Ambulatory Visit: Payer: Self-pay | Admitting: Family Medicine

## 2018-08-16 DIAGNOSIS — E119 Type 2 diabetes mellitus without complications: Secondary | ICD-10-CM

## 2018-09-03 ENCOUNTER — Other Ambulatory Visit: Payer: Self-pay

## 2018-09-03 ENCOUNTER — Ambulatory Visit (INDEPENDENT_AMBULATORY_CARE_PROVIDER_SITE_OTHER): Payer: Medicare Other | Admitting: Family Medicine

## 2018-09-03 ENCOUNTER — Encounter: Payer: Self-pay | Admitting: Family Medicine

## 2018-09-03 VITALS — BP 127/72 | HR 78 | Temp 97.6°F | Ht 60.0 in | Wt 203.0 lb

## 2018-09-03 DIAGNOSIS — E1142 Type 2 diabetes mellitus with diabetic polyneuropathy: Secondary | ICD-10-CM

## 2018-09-03 DIAGNOSIS — Z23 Encounter for immunization: Secondary | ICD-10-CM | POA: Diagnosis not present

## 2018-09-03 NOTE — Progress Notes (Signed)
   BP 127/72   Pulse 78   Temp 97.6 F (36.4 C) (Oral)   Ht 5' (1.524 m)   Wt 203 lb (92.1 kg)   SpO2 98%   BMI 39.65 kg/m    Subjective:    [name], female    DOB: 12-30-1945, 72 y.o.   MRN: [id number]  HPI: Tami Cummings is a 72 y.o. female  Chief Complaint  Patient presents with  . Diabetes    55mf/u   Here today for DM f/u. Has not been checking sugars, keeps losing her glucometer - states her grandkids keep moving it.  Taking soliqua (50 units at bedtime) and humalog (30 units split during the day) as well as onglyza. Has not made any diet or exercise changes since last visit as discussed. No hypoglycemic episodes.   Relevant past medical, surgical, family and social history reviewed and updated as indicated. Interim medical history since our last visit reviewed. Allergies and medications reviewed and updated.  Review of Systems  Per HPI unless specifically indicated above     Objective:    BP 127/72   Pulse 78   Temp 97.6 F (36.4 C) (Oral)   Ht 5' (1.524 m)   Wt 203 lb (92.1 kg)   SpO2 98%   BMI 39.65 kg/m   Wt Readings from Last 3 Encounters:  09/03/18 203 lb (92.1 kg)  06/03/18 204 lb (92.5 kg)  05/30/18 204 lb 9.6 oz (92.8 kg)    Physical Exam  Constitutional: She is oriented to person, place, and time. No distress.  obese  HENT:  Head: Atraumatic.  Eyes: Conjunctivae and EOM are normal.  Neck: Normal range of motion. Neck supple.  Cardiovascular: Normal rate and regular rhythm.  Pulmonary/Chest: Effort normal and breath sounds normal.  Musculoskeletal: Normal range of motion.  Neurological: She is alert and oriented to person, place, and time.  Skin: Skin is warm and dry.  Psychiatric: She has a normal mood and affect. Her behavior is normal.  Nursing note and vitals reviewed.   Results for orders placed or performed in visit on 09/03/18  HgB A1c  Result Value Ref Range   Hgb A1c MFr Bld 8.6 (H) 4.8 - 5.6 %     Est. average glucose Bld gHb Est-mCnc 200 mg/dL      Assessment & Plan:   Problem List Items Addressed This Visit      Endocrine   DM type 2 with diabetic peripheral neuropathy ([place] - Primary    Recheck A1C today and adjust as needed. Discussed importance of checking sugars regularly to establish trends and baselines and avoid hypoglycemia. Also reviewed critical nature of good diet and exercise in sugar control. Recommended she meet with the dietician, pt refused. F/u in 3 months for recheck      Relevant Orders   HgB A1c (Completed)    Other Visit Diagnoses    Flu vaccine need       Relevant Orders   Flu vaccine HIGH DOSE PF (Completed)       Follow up plan: Return in about 3 months (around 12/03/2018) for A1C, BP, Chol check with MAC.

## 2018-09-04 ENCOUNTER — Telehealth: Payer: Self-pay | Admitting: Family Medicine

## 2018-09-04 LAB — HEMOGLOBIN A1C
Est. average glucose Bld gHb Est-mCnc: 200 mg/dL
Hgb A1c MFr Bld: 8.6 % — ABNORMAL HIGH (ref 4.8–5.6)

## 2018-09-04 MED ORDER — EMPAGLIFLOZIN 10 MG PO TABS: 10.0000 mg | ORAL_TABLET | Freq: Every day | ORAL | 2 refills | Status: DC

## 2018-09-04 NOTE — Telephone Encounter (Signed)
Please let her know her A1C is up from 8.1 to 8.6 so I would like her to add jardiance to her regular regimen as well as work hard on dietary improvements and exercise. Happy to get her into the dietician if she would like to speak to someone about that. Recheck in 3 months

## 2018-09-04 NOTE — Telephone Encounter (Signed)
Left message on machine for pt to return call to the office.  

## 2018-09-04 NOTE — Telephone Encounter (Signed)
Message relayed to patient. Verbalized understanding and denied questions.   

## 2018-09-06 NOTE — Assessment & Plan Note (Signed)
Recheck A1C today and adjust as needed. Discussed importance of checking sugars regularly to establish trends and baselines and avoid hypoglycemia. Also reviewed critical nature of good diet and exercise in sugar control. Recommended she meet with the dietician, pt refused. F/u in 3 months for recheck

## 2018-09-06 NOTE — Patient Instructions (Signed)
Follow up in 3 months

## 2018-12-04 ENCOUNTER — Ambulatory Visit (INDEPENDENT_AMBULATORY_CARE_PROVIDER_SITE_OTHER): Payer: Medicare Other | Admitting: Family Medicine

## 2018-12-04 ENCOUNTER — Encounter: Payer: Self-pay | Admitting: Family Medicine

## 2018-12-04 VITALS — BP 138/65 | HR 83 | Temp 97.6°F | Ht 60.5 in | Wt 203.6 lb

## 2018-12-04 DIAGNOSIS — E7800 Pure hypercholesterolemia, unspecified: Secondary | ICD-10-CM

## 2018-12-04 DIAGNOSIS — E1142 Type 2 diabetes mellitus with diabetic polyneuropathy: Secondary | ICD-10-CM | POA: Diagnosis not present

## 2018-12-04 DIAGNOSIS — I10 Essential (primary) hypertension: Secondary | ICD-10-CM | POA: Diagnosis not present

## 2018-12-04 LAB — BAYER DCA HB A1C WAIVED: HB A1C (BAYER DCA - WAIVED): 7.8 % — ABNORMAL HIGH (ref <7.0)

## 2018-12-04 LAB — LP+ALT+AST PICCOLO, WAIVED
ALT (SGPT) Piccolo, Waived: 19 U/L (ref 10–47)
AST (SGOT) Piccolo, Waived: 22 U/L (ref 11–38)
Chol/HDL Ratio Piccolo,Waive: 3.6 mg/dL
Cholesterol Piccolo, Waived: 137 mg/dL (ref <200)
HDL Chol Piccolo, Waived: 38 mg/dL — ABNORMAL LOW (ref >59)
LDL Chol Calc Piccolo Waived: 47 mg/dL (ref <100)
Triglycerides Piccolo,Waived: 262 mg/dL — ABNORMAL HIGH (ref <150)
VLDL Chol Calc Piccolo,Waive: 52 mg/dL — ABNORMAL HIGH (ref <30)

## 2018-12-04 LAB — MICROALBUMIN, URINE WAIVED
Creatinine, Urine Waived: 50 mg/dL (ref 10–300)
Microalb, Ur Waived: 30 mg/L — ABNORMAL HIGH (ref 0–19)

## 2018-12-04 MED ORDER — EMPAGLIFLOZIN 10 MG PO TABS: 10.0000 mg | ORAL_TABLET | Freq: Every day | ORAL | 2 refills | Status: DC

## 2018-12-04 NOTE — Assessment & Plan Note (Signed)
The current medical regimen is effective;  continue present plan and medications.  

## 2018-12-04 NOTE — Progress Notes (Signed)
BP 138/65 (BP Location: Left Arm, Patient Position: Sitting, Cuff Size: Normal)   Pulse 83   Temp 97.6 F (36.4 C) (Oral)   Ht 5' 0.5" (1.537 m)   Wt 203 lb 9.6 oz (92.4 kg)   SpO2 97%   BMI 39.11 kg/m    Subjective:    Patient ID: [name], female    DOB: August 18, 1946, 72 y.o.   MRN: [id number]  HPI: Ermagene Saidi is a 72 y.o. female  Chief Complaint  Patient presents with  . Diabetes  . Hyperlipidemia  . Hypertension  Patient all in all doing well is back from [place] and no issues did not gain weight while she was gone.  Taking medications without issues no low blood sugar spells using insulin appropriately without problems. Blood pressure good control. No issues with lovastatin for cholesterol.   Relevant past medical, surgical, family and social history reviewed and updated as indicated. Interim medical history since our last visit reviewed. Allergies and medications reviewed and updated.  Review of Systems  Constitutional: Negative.   Respiratory: Negative.   Cardiovascular: Negative.     Per HPI unless specifically indicated above     Objective:    BP 138/65 (BP Location: Left Arm, Patient Position: Sitting, Cuff Size: Normal)   Pulse 83   Temp 97.6 F (36.4 C) (Oral)   Ht 5' 0.5" (1.537 m)   Wt 203 lb 9.6 oz (92.4 kg)   SpO2 97%   BMI 39.11 kg/m   Wt Readings from Last 3 Encounters:  12/04/18 203 lb 9.6 oz (92.4 kg)  09/03/18 203 lb (92.1 kg)  06/03/18 204 lb (92.5 kg)    Physical Exam Constitutional:      Appearance: She is well-developed.  HENT:     Head: Normocephalic and atraumatic.  Eyes:     Conjunctiva/sclera: Conjunctivae normal.  Neck:     Musculoskeletal: Normal range of motion.  Cardiovascular:     Rate and Rhythm: Normal rate and regular rhythm.     Heart sounds: Normal heart sounds.  Pulmonary:     Effort: Pulmonary effort is normal.     Breath sounds: Normal breath sounds.  Musculoskeletal: Normal range  of motion.  Skin:    Findings: No erythema.  Neurological:     Mental Status: She is alert and oriented to person, place, and time.  Psychiatric:        Behavior: Behavior normal.        Thought Content: Thought content normal.        Judgment: Judgment normal.     Results for orders placed or performed in visit on 09/03/18  HgB A1c  Result Value Ref Range   Hgb A1c MFr Bld 8.6 (H) 4.8 - 5.6 %   Est. average glucose Bld gHb Est-mCnc 200 mg/dL      Assessment & Plan:   Problem List Items Addressed This Visit      Cardiovascular and Mediastinum   Hypertension    The current medical regimen is effective;  continue present plan and medications.       Relevant Orders   Basic metabolic panel     Endocrine   DM type 2 with diabetic peripheral neuropathy ([place]) - Primary    The current medical regimen is effective;  continue present plan and medications.       Relevant Orders   Basic metabolic panel   Bayer DCA Hb A1c Waived   Microalbumin, Urine Waived  Other   Hyperlipidemia    The current medical regimen is effective;  continue present plan and medications.       Relevant Orders   LP+ALT+AST [place], Waived       Follow up plan: Return in about 6 months (around 06/05/2019) for Physical Exam, Hemoglobin A1c.

## 2018-12-05 ENCOUNTER — Encounter: Payer: Self-pay | Admitting: Family Medicine

## 2018-12-05 LAB — BASIC METABOLIC PANEL
BUN/Creatinine Ratio: 12 (ref 12–28)
BUN: 9 mg/dL (ref 8–27)
CO2: 23 mmol/L (ref 20–29)
Calcium: 9.2 mg/dL (ref 8.7–10.3)
Chloride: 104 mmol/L (ref 96–106)
Creatinine, Ser: 0.76 mg/dL (ref 0.57–1.00)
GFR calc Af Amer: 91 mL/min/{1.73_m2} (ref >59)
GFR calc non Af Amer: 79 mL/min/{1.73_m2} (ref >59)
Glucose: 97 mg/dL (ref 65–99)
Potassium: 3.7 mmol/L (ref 3.5–5.2)
Sodium: 143 mmol/L (ref 134–144)

## 2019-03-25 ENCOUNTER — Telehealth: Payer: Self-pay | Admitting: Family Medicine

## 2019-03-25 NOTE — Telephone Encounter (Signed)
Copied from [place] [id number]. Topic: Quick Communication - Rx Refill/Question >> Mar 25, 2019  2:12 PM Scherrie Gerlach wrote: Medication: Insulin Glargine-Lixisenatide (SOLIQUA) 100-33 UNT-MCG/ML SOPN insulin lispro (HUMALOG KWIKPEN) 100 UNIT/ML Lubertha Basque w/ [facility] calling to ask if the PA on these 2 meds have been done or if you will do.  She called pt's insuranc and they are requirinf a prior auth. Mickel Baas asking if you will call her back when this is done.  Pt is needing her insulin.  [facility] [address] [place] [phone] (Phone) [phone] (Fax)

## 2019-03-26 NOTE — Telephone Encounter (Signed)
Prior Authorization initiated via CoverMyMeds for [employer] Key: [id number]

## 2019-03-26 NOTE — Telephone Encounter (Signed)
Per [employer]. Willeen Niece is available without authorization.

## 2019-03-26 NOTE — Telephone Encounter (Signed)
Tami Cummings, have you done either one of these?

## 2019-03-26 NOTE — Telephone Encounter (Signed)
Any PA?

## 2019-04-03 NOTE — Telephone Encounter (Signed)
Humalog Kiwipen was denied due to lack of information in progress notes.   Provider has to write a medical neccessity and I can attach it to the appeal.  Dr. Jeananne Rama, could you write a letter of why patient needs this medication?

## 2019-04-07 NOTE — Telephone Encounter (Signed)
PA has been approved for Insulin Lispro 100 unit/mL pen from 04/07/19-12/18/2019

## 2019-05-14 ENCOUNTER — Other Ambulatory Visit: Payer: Self-pay | Admitting: Family Medicine

## 2019-05-14 DIAGNOSIS — Z1231 Encounter for screening mammogram for malignant neoplasm of breast: Secondary | ICD-10-CM

## 2019-05-23 ENCOUNTER — Ambulatory Visit
Admission: RE | Admit: 2019-05-23 | Discharge: 2019-05-23 | Disposition: A | Discharge status: Home / Self Care | Payer: Medicare Other | Source: Ambulatory Visit | Attending: Family Medicine | Admitting: Family Medicine

## 2019-05-23 ENCOUNTER — Encounter (INDEPENDENT_AMBULATORY_CARE_PROVIDER_SITE_OTHER): Payer: Self-pay

## 2019-05-23 ENCOUNTER — Other Ambulatory Visit: Payer: Self-pay

## 2019-05-23 DIAGNOSIS — Z1231 Encounter for screening mammogram for malignant neoplasm of breast: Secondary | ICD-10-CM | POA: Diagnosis not present

## 2019-05-26 ENCOUNTER — Ambulatory Visit (INDEPENDENT_AMBULATORY_CARE_PROVIDER_SITE_OTHER): Payer: Medicare Other

## 2019-05-26 VITALS — BP 128/70 | HR 72 | Temp 96.3°F

## 2019-05-26 DIAGNOSIS — Z0000 Encounter for general adult medical examination without abnormal findings: Secondary | ICD-10-CM

## 2019-05-26 NOTE — Patient Instructions (Signed)
[name] , Thank you for taking time to come for your Medicare Wellness Visit. I appreciate your ongoing commitment to your health goals. Please review the following plan we discussed and let me know if I can assist you in the future.   Screening recommendations/referrals: Colonoscopy: colonoscopy completed 10/2015, yearly fecal occult test due 07/27/2019 Mammogram: completed 05/23/2019 Bone Density: completed 03/27/2016 Recommended yearly ophthalmology/optometry visit for glaucoma screening and checkup Recommended yearly dental visit for hygiene and checkup  Vaccinations: Influenza vaccine: up to date Pneumococcal vaccine: up to date Tdap vaccine: up to date Shingles vaccine: shingrix eligible check with your insurance/pharmacy for coverage information    Advanced directives: please let us know if you need assistance filing out your paperowrk or have any questions   Conditions/risks identified: quit smoking in 2014, eligible for lung cancer screening. Declined at this time. Please let us know if you change your mind.   Next appointment: follow up in one year for your annual wellness exam.    Preventive Care 65 Years and Older, Female Preventive care refers to lifestyle choices and visits with your health care provider that can promote health and wellness. What does preventive care include?  A yearly physical exam. This is also called an annual well check.  Dental exams once or twice a year.  Routine eye exams. Ask your health care provider how often you should have your eyes checked.  Personal lifestyle choices, including:  Daily care of your teeth and gums.  Regular physical activity.  Eating a healthy diet.  Avoiding tobacco and drug use.  Limiting alcohol use.  Practicing safe sex.  Taking low-dose aspirin every day.  Taking vitamin and mineral supplements as recommended by your health care provider. What happens during an annual well check? The services and  screenings done by your health care provider during your annual well check will depend on your age, overall health, lifestyle risk factors, and family history of disease. Counseling  Your health care provider may ask you questions about your:  Alcohol use.  Tobacco use.  Drug use.  Emotional well-being.  Home and relationship well-being.  Sexual activity.  Eating habits.  History of falls.  Memory and ability to understand (cognition).  Work and work Statistician.  Reproductive health. Screening  You may have the following tests or measurements:  Height, weight, and BMI.  Blood pressure.  Lipid and cholesterol levels. These may be checked every 5 years, or more frequently if you are over 10 years old.  Skin check.  Lung cancer screening. You may have this screening every year starting at age 52 if you have a 30-pack-year history of smoking and currently smoke or have quit within the past 15 years.  Fecal occult blood test (FOBT) of the stool. You may have this test every year starting at age 59.  Flexible sigmoidoscopy or colonoscopy. You may have a sigmoidoscopy every 5 years or a colonoscopy every 10 years starting at age 9.  Hepatitis C blood test.  Hepatitis B blood test.  Sexually transmitted disease (STD) testing.  Diabetes screening. This is done by checking your blood sugar (glucose) after you have not eaten for a while (fasting). You may have this done every 1-3 years.  Bone density scan. This is done to screen for osteoporosis. You may have this done starting at age 34.  Mammogram. This may be done every 1-2 years. Talk to your health care provider about how often you should have regular mammograms. Talk with your  health care provider about your test results, treatment options, and if necessary, the need for more tests. Vaccines  Your health care provider may recommend certain vaccines, such as:  Influenza vaccine. This is recommended every year.   Tetanus, diphtheria, and acellular pertussis (Tdap, Td) vaccine. You may need a Td booster every 10 years.  Zoster vaccine. You may need this after age 78.  Pneumococcal 13-valent conjugate (PCV13) vaccine. One dose is recommended after age 14.  Pneumococcal polysaccharide (PPSV23) vaccine. One dose is recommended after age 37. Talk to your health care provider about which screenings and vaccines you need and how often you need them. This information is not intended to replace advice given to you by your health care provider. Make sure you discuss any questions you have with your health care provider. Document Released: 12/31/2015 Document Revised: 08/23/2016 Document Reviewed: 10/05/2015 Elsevier Interactive Patient Education  2017 [employer] Prevention in the Home Falls can cause injuries. They can happen to people of all ages. There are many things you can do to make your home safe and to help prevent falls. What can I do on the outside of my home?  Regularly fix the edges of walkways and driveways and fix any cracks.  Remove anything that might make you trip as you walk through a door, such as a raised step or threshold.  Trim any bushes or trees on the path to your home.  Use bright outdoor lighting.  Clear any walking paths of anything that might make someone trip, such as rocks or tools.  Regularly check to see if handrails are loose or broken. Make sure that both sides of any steps have handrails.  Any raised decks and porches should have guardrails on the edges.  Have any leaves, snow, or ice cleared regularly.  Use sand or salt on walking paths during winter.  Clean up any spills in your garage right away. This includes oil or grease spills. What can I do in the bathroom?  Use night lights.  Install grab bars by the toilet and in the tub and shower. Do not use towel bars as grab bars.  Use non-skid mats or decals in the tub or shower.  If you need to sit  down in the shower, use a plastic, non-slip stool.  Keep the floor dry. Clean up any water that spills on the floor as soon as it happens.  Remove soap buildup in the tub or shower regularly.  Attach bath mats securely with double-sided non-slip rug tape.  Do not have throw rugs and other things on the floor that can make you trip. What can I do in the bedroom?  Use night lights.  Make sure that you have a light by your bed that is easy to reach.  Do not use any sheets or blankets that are too big for your bed. They should not hang down onto the floor.  Have a firm chair that has side arms. You can use this for support while you get dressed.  Do not have throw rugs and other things on the floor that can make you trip. What can I do in the kitchen?  Clean up any spills right away.  Avoid walking on wet floors.  Keep items that you use a lot in easy-to-reach places.  If you need to reach something above you, use a strong step stool that has a grab bar.  Keep electrical cords out of the way.  Do not use  floor polish or wax that makes floors slippery. If you must use wax, use non-skid floor wax.  Do not have throw rugs and other things on the floor that can make you trip. What can I do with my stairs?  Do not leave any items on the stairs.  Make sure that there are handrails on both sides of the stairs and use them. Fix handrails that are broken or loose. Make sure that handrails are as long as the stairways.  Check any carpeting to make sure that it is firmly attached to the stairs. Fix any carpet that is loose or worn.  Avoid having throw rugs at the top or bottom of the stairs. If you do have throw rugs, attach them to the floor with carpet tape.  Make sure that you have a light switch at the top of the stairs and the bottom of the stairs. If you do not have them, ask someone to add them for you. What else can I do to help prevent falls?  Wear shoes that:  Do not have  high heels.  Have rubber bottoms.  Are comfortable and fit you well.  Are closed at the toe. Do not wear sandals.  If you use a stepladder:  Make sure that it is fully opened. Do not climb a closed stepladder.  Make sure that both sides of the stepladder are locked into place.  Ask someone to hold it for you, if possible.  Clearly mark and make sure that you can see:  Any grab bars or handrails.  First and last steps.  Where the edge of each step is.  Use tools that help you move around (mobility aids) if they are needed. These include:  Canes.  Walkers.  Scooters.  Crutches.  Turn on the lights when you go into a dark area. Replace any light bulbs as soon as they burn out.  Set up your furniture so you have a clear path. Avoid moving your furniture around.  If any of your floors are uneven, fix them.  If there are any pets around you, be aware of where they are.  Review your medicines with your doctor. Some medicines can make you feel dizzy. This can increase your chance of falling. Ask your doctor what other things that you can do to help prevent falls. This information is not intended to replace advice given to you by your health care provider. Make sure you discuss any questions you have with your health care provider. Document Released: 09/30/2009 Document Revised: 05/11/2016 Document Reviewed: 01/08/2015 Elsevier Interactive Patient Education  2017 [employer].

## 2019-05-26 NOTE — Progress Notes (Signed)
Subjective:   Tami Cummings is a 73 y.o. female who presents for Medicare Annual (Subsequent) preventive examination.  This visit is being conducted via phone call  - after an attmept to do on video chat - due to the COVID-19 pandemic. This patient has given me verbal consent via phone to conduct this visit, patient states they are participating from their home address. Some vital signs may be absent or patient reported.   Patient identification: identified by name, DOB, and current address.    Review of Systems:   Cardiac Risk Factors include: advanced age (>55mn, >>4women);hypertension;diabetes mellitus;smoking/ tobacco exposure     Objective:     Vitals: BP 128/70 Comment: patient reported  Pulse 72 Comment: patient reported  Temp (!) 96.3 F (35.7 C) (Oral) Comment: patient reported  There is no height or weight on file to calculate BMI.  Advanced Directives 05/26/2019 06/03/2018 05/23/2018 09/20/2015  Does Patient Have a Medical Advance Directive? Yes Yes No No  Type of Advance Directive - Healthcare [place] - -  Does patient want to make changes to medical advance directive? - No - Patient declined - -  Copy of [place]? - No - copy requested - -  Would patient like information on creating a medical advance directive? - - No - Patient declined No - patient declined information    Tobacco Social History   Tobacco Use  Smoking Status Former Smoker  . Packs/day: 1.00  . Years: 20.00  . Pack years: 20.00  . Types: Cigarettes  . Last attempt to quit: 08/26/2013  . Years since quitting: 5.7  Smokeless Tobacco Never Used     Counseling given: Not Answered   Clinical Intake:  Pre-visit preparation completed: Yes  Pain : No/denies pain     Nutritional Risks: None Diabetes: Yes CBG done?: No Did pt. bring in CBG monitor from home?: No  How often do you need to have someone help you when you read  instructions, pamphlets, or other written materials from your doctor or pharmacy?: 1 - Never What is the last grade level you completed in school?: high school  Nutrition Risk Assessment:  Has the patient had any N/V/D within the last 2 months?  No  Does the patient have any non-healing wounds?  No  Has the patient had any unintentional weight loss or weight gain?  No   Diabetes:  Is the patient diabetic?  Yes  If diabetic, was a CBG obtained today?  No  Did the patient bring in their glucometer from home?  n/a How often do you monitor your CBG's? 1-3 times a day .   Financial Strains and Diabetes Management:  Are you having any financial strains with the device, your supplies or your medication? No .  Does the patient want to be seen by Chronic Care Management for management of their diabetes?  No  Would the patient like to be referred to a Nutritionist or for Diabetic Management?  No   Diabetic Exams:  Diabetic Eye Exam: Completed 04/01/2018. Overdue for diabetic eye exam. Pt has been advised about the importance in completing this exam. Patient states [facility] is not in her network anymore, discussed other eye doctors for her to try. She will try Dr.Woodards office first and call back if she needs some more options.   Diabetic Foot Exam: Completed 05/30/2018. Pt has been advised about the importance in completing this exam. Pt is scheduled  for diabetic foot exam on 06/12/2019   Interpreter Needed?: No  Information entered by :: Gunter Conde,LPN   Past Medical History:  Diagnosis Date  . Arthritis    knee  . COPD (chronic obstructive pulmonary disease) ([place])   . Diabetes mellitus without complication ([place])    type 2  . Dyspnea   . Hyperlipidemia   . Hypertension    controlled  . Neuromuscular disorder (HCC)    numbness in hands and feet  . Shoulder pain, right   . Wears dentures    upper and lower   Past Surgical History:  Procedure Laterality Date  .  ABDOMINAL HYSTERECTOMY    . BREAST BIOPSY Right 04/12/2016   stereo bx/clip- neg  . CARPAL TUNNEL RELEASE  06/17/00  . CATARACT EXTRACTION W/PHACO Left 09/20/2015   Procedure: CATARACT EXTRACTION PHACO AND INTRAOCULAR LENS PLACEMENT ([place]);  Surgeon: Ronnell Freshwater, MD;  Location: [facility];  Service: Ophthalmology;  Laterality: Left;  DIABETIC - insulin  . CATARACT EXTRACTION W/PHACO Right 06/03/2018   Procedure: CATARACT EXTRACTION PHACO AND INTRAOCULAR LENS PLACEMENT ([place]) RIGHT DIABETES;  Surgeon: Eulogio Bear, MD;  Location: [facility];  Service: Ophthalmology;  Laterality: Right;  Diabetes-insulin dependent  . CESAREAN SECTION     x2  . CHOLECYSTECTOMY    . EYE SURGERY     Family History  Problem Relation Age of Onset  . Heart disease Father   . Cancer Sister   . Cancer Brother   . Breast cancer Neg Hx    Social History   Socioeconomic History  . Marital status: Single    Spouse name: Not on file  . Number of children: Not on file  . Years of education: Not on file  . Highest education level: High school graduate  Occupational History  . Occupation: retired   Scientific laboratory technician  . Financial resource strain: Not hard at all  . Food insecurity:    Worry: Never true    Inability: Never true  . Transportation needs:    Medical: No    Non-medical: No  Tobacco Use  . Smoking status: Former Smoker    Packs/day: 1.00    Years: 20.00    Pack years: 20.00    Types: Cigarettes    Last attempt to quit: 08/26/2013    Years since quitting: 5.7  . Smokeless tobacco: Never Used  Substance and Sexual Activity  . Alcohol use: No    Alcohol/week: 0.0 standard drinks  . Drug use: No  . Sexual activity: Not on file  Lifestyle  . Physical activity:    Days per week: 0 days    Minutes per session: 0 min  . Stress: Not at all  Relationships  . Social connections:    Talks on phone: More than three times a week    Gets together: More than three times a  week    Attends religious service: Never    Active member of club or organization: No    Attends meetings of clubs or organizations: Never    Relationship status: Divorced  Other Topics Concern  . Not on file  Social History Narrative   Daughter lives with her currently     Outpatient Encounter Medications as of 05/26/2019  Medication Sig  . amLODipine (NORVASC) 10 MG tablet Take 1 tablet (10 mg total) by mouth daily. (Patient taking differently: Take 10 mg by mouth daily. am)  . aspirin EC 81 MG tablet Take 81 mg by  mouth daily. am  . atenolol (TENORMIN) 100 MG tablet Take 1 tablet (100 mg total) by mouth daily. (Patient taking differently: Take 100 mg by mouth daily. pm)  . BD PEN NEEDLE MICRO U/F 32G X 6 MM MISC USE 4-5 TIMES DAILY WITH INSULIN  . Blood Glucose Monitoring Suppl (ONE TOUCH ULTRA 2) w/Device KIT USE ONCE TO TWICE DAILY  . empagliflozin (JARDIANCE) 10 MG TABS tablet Take 10 mg by mouth daily.  Marland Kitchen glucose blood (ONE TOUCH ULTRA TEST) test strip USE ONE TO TWO STRIPS TO CHECK GLUCOSE ONCE DAILY  . Insulin Glargine-Lixisenatide (SOLIQUA) 100-33 UNT-MCG/ML SOPN Inject 60 Units into the skin daily. Start 30 u increase 2 u every 2 days as needed max 60u a day (Patient taking differently: Inject 60 Units into the skin daily. Start 30 u increase 2 u every 2 days as needed max 60u a day/ night time)  . insulin lispro (HUMALOG KWIKPEN) 100 UNIT/ML KiwkPen Inject 0.25 mLs (25 Units total) into the skin 2 (two) times daily.  Marland Kitchen lovastatin (MEVACOR) 40 MG tablet Take 1 tablet (40 mg total) by mouth daily with breakfast.  . saxagliptin HCl (ONGLYZA) 5 MG TABS tablet Take 1 tablet (5 mg total) by mouth daily. (Patient taking differently: Take 5 mg by mouth daily. am)  . spironolactone (ALDACTONE) 25 MG tablet Take 1 tablet (25 mg total) by mouth daily. (Patient taking differently: Take 25 mg by mouth daily. am)  . [DISCONTINUED] naproxen sodium (ALEVE) 220 MG tablet Take 220 mg by mouth 2  (two) times daily as needed.   No facility-administered encounter medications on file as of 05/26/2019.     Activities of Daily Living In your present state of health, do you have any difficulty performing the following activities: 05/26/2019 06/03/2018  Hearing? N N  Vision? N N  Difficulty concentrating or making decisions? N N  Walking or climbing stairs? N N  Comment knee pain  -  Dressing or bathing? N N  Doing errands, shopping? N -  Preparing Food and eating ? N -  Using the Toilet? N -  In the past six months, have you accidently leaked urine? N -  Do you have problems with loss of bowel control? N -  Managing your Medications? N -  Managing your Finances? N -  Housekeeping or managing your Housekeeping? N -  Some recent data might be hidden    Patient Care Team: [name], MD as PCP - General (Family Medicine) Kem Parkinson, MD (Ophthalmology)    Assessment:   This is a routine wellness examination for Canna.  Exercise Activities and Dietary recommendations Current Exercise Habits: The patient does not participate in regular exercise at present, Exercise limited by: None identified  Goals    . DIET - INCREASE WATER INTAKE     Recommend drinking at least 6-8 glasses of water a day        Fall Risk: Fall Risk  05/23/2018 01/29/2018 10/16/2017 07/10/2017 04/10/2017  Falls in the past year? No No No No No    FALL RISK PREVENTION PERTAINING TO THE HOME:  Any stairs in or around the home? Yes  If so, are there any without handrails? No   Home free of loose throw rugs in walkways, pet beds, electrical cords, etc? Yes  Adequate lighting in your home to reduce risk of falls? Yes   ASSISTIVE DEVICES UTILIZED TO PREVENT FALLS:  Life alert? No  Use of a cane, walker or w/c?  Yes cane occasionally  Grab bars in the bathroom? No  Shower chair or bench in shower? No  Elevated toilet seat or a handicapped toilet? No   TIMED UP AND GO:  Unable to perform     Depression Screen PHQ 2/9 Scores 05/23/2018 01/29/2018 10/16/2017 07/10/2017  PHQ - 2 Score 0 0 0 0  PHQ- 9 Score - 1 - -     Cognitive Function     6CIT Screen 05/26/2019 05/23/2018  What Year? 0 points 0 points  What month? 0 points 0 points  What time? 0 points 0 points  Count back from 20 0 points 0 points  Months in reverse 0 points 0 points  Repeat phrase 2 points 2 points  Total Score 2 2    Immunization History  Administered Date(s) Administered  . Influenza, High Dose Seasonal PF 10/30/2016, 10/17/2017, 09/03/2018  . Influenza,inj,Quad PF,6+ Mos 09/27/2015  . Influenza-Unspecified 08/18/2014, 10/17/2017  . Pneumococcal Conjugate-13 12/28/2014  . Pneumococcal-Unspecified 12/30/2012  . Td 05/23/2005, 03/14/2016  . Zoster 03/11/2014    Qualifies for Shingles Vaccine? Yes  Zostavax completed 03/11/2014. Due for Shingrix. Education has been provided regarding the importance of this vaccine. Pt has been advised to call insurance company to determine out of pocket expense. Advised may also receive vaccine at local pharmacy or Health Dept. Verbalized acceptance and understanding.  Tdap: up to date  Flu Vaccine: up to date  Pneumococcal Vaccine: up to date   Screening Tests Health Maintenance  Topic Date Due  . OPHTHALMOLOGY EXAM  04/02/2019  . FOOT EXAM  05/31/2019  . HEMOGLOBIN A1C  06/05/2019  . INFLUENZA VACCINE  07/19/2019  . COLON CANCER SCREENING ANNUAL FOBT  07/26/2019  . URINE MICROALBUMIN  12/05/2019  . MAMMOGRAM  05/22/2021  . COLONOSCOPY  10/24/2025  . TETANUS/TDAP  03/14/2026  . DEXA SCAN  Completed  . Hepatitis C Screening  Completed  . PNA vac Low Risk Adult  Completed    Cancer Screenings:  Colorectal //Screening: Completed 10/25/2015. Repeat every 10 years; FOBT done 07/25/2018  Mammogram: Completed 05/23/2019. Repeat every year  Bone Density: Completed 03/27/2016  Lung Cancer Screening: (Low Dose CT Chest recommended if Age 24-80 years, 30 pack-year  currently smoking OR have quit w/in 15years.) does qualify.  Declined at this time due to covid-19.   Additional Screening:  Hepatitis C Screening: does qualify; Completed 03/14/2016  Dental Screening: Recommended annual dental exams for proper oral hygiene   Community Resource Referral:  CRR required this visit?  No       Plan:  I have personally reviewed and addressed the Medicare Annual Wellness questionnaire and have noted the following in the patient's chart:  A. Medical and social history B. Use of alcohol, tobacco or illicit drugs  C. Current medications and supplements D. Functional ability and status E.  Nutritional status F.  Physical activity G. Advance directives H. List of other physicians I.  Hospitalizations, surgeries, and ER visits in previous 12 months J.  [place] such as hearing and vision if needed, cognitive and depression L. Referrals and appointments   In addition, I have reviewed and discussed with patient certain preventive protocols, quality metrics, and best practice recommendations. A written personalized care plan for preventive services as well as general preventive health recommendations were provided to patient.   Signed,    Bevelyn Ngo, LPN  04/22/3148 Nurse Health Advisor   Nurse Notes: none

## 2019-06-12 ENCOUNTER — Encounter: Payer: Medicare Other | Admitting: Family Medicine

## 2019-06-16 ENCOUNTER — Ambulatory Visit: Payer: Medicare Other | Admitting: Family Medicine

## 2019-06-16 ENCOUNTER — Other Ambulatory Visit: Payer: Self-pay

## 2019-06-17 ENCOUNTER — Encounter: Payer: Self-pay | Admitting: Family Medicine

## 2019-06-17 ENCOUNTER — Ambulatory Visit (INDEPENDENT_AMBULATORY_CARE_PROVIDER_SITE_OTHER): Payer: Medicare Other | Admitting: Family Medicine

## 2019-06-17 DIAGNOSIS — D692 Other nonthrombocytopenic purpura: Secondary | ICD-10-CM | POA: Diagnosis not present

## 2019-06-17 DIAGNOSIS — Z7189 Other specified counseling: Secondary | ICD-10-CM | POA: Diagnosis not present

## 2019-06-17 DIAGNOSIS — E1142 Type 2 diabetes mellitus with diabetic polyneuropathy: Secondary | ICD-10-CM

## 2019-06-17 DIAGNOSIS — E7800 Pure hypercholesterolemia, unspecified: Secondary | ICD-10-CM | POA: Diagnosis not present

## 2019-06-17 DIAGNOSIS — I10 Essential (primary) hypertension: Secondary | ICD-10-CM | POA: Diagnosis not present

## 2019-06-17 MED ORDER — AMLODIPINE BESYLATE 10 MG PO TABS: 10.0000 mg | ORAL_TABLET | Freq: Every day | ORAL | 4 refills | Status: DC

## 2019-06-17 MED ORDER — LOVASTATIN 40 MG PO TABS: 40.0000 mg | ORAL_TABLET | Freq: Every day | ORAL | 4 refills | Status: DC

## 2019-06-17 MED ORDER — INSULIN LISPRO (1 UNIT DIAL) 100 UNIT/ML (KWIKPEN): 25.0000 [IU] | PEN_INJECTOR | Freq: Two times a day (BID) | SUBCUTANEOUS | 11 refills | Status: DC

## 2019-06-17 MED ORDER — ATENOLOL 100 MG PO TABS: 100.0000 mg | ORAL_TABLET | Freq: Every day | ORAL | 4 refills | Status: DC

## 2019-06-17 MED ORDER — SPIRONOLACTONE 25 MG PO TABS: 25.0000 mg | ORAL_TABLET | Freq: Every day | ORAL | 4 refills | Status: DC

## 2019-06-17 MED ORDER — SAXAGLIPTIN HCL 5 MG PO TABS: 5.0000 mg | ORAL_TABLET | Freq: Every day | ORAL | 4 refills | Status: DC

## 2019-06-17 MED ORDER — SOLIQUA 100-33 UNT-MCG/ML ~~LOC~~ SOPN: 60.0000 [IU] | PEN_INJECTOR | Freq: Every day | SUBCUTANEOUS | 12 refills | Status: DC

## 2019-06-17 MED ORDER — JARDIANCE 10 MG PO TABS: 10.0000 mg | ORAL_TABLET | Freq: Every day | ORAL | 4 refills | Status: DC

## 2019-06-17 NOTE — Assessment & Plan Note (Signed)
The current medical regimen is effective;  continue present plan and medications.  

## 2019-06-17 NOTE — Progress Notes (Signed)
There were no vitals taken for this visit.   Subjective:    [name], female    DOB: Jan 05, 1946, 73 y.o.   MRN: [id number]  HPI: Tami Cummings is a 73 y.o. female  Med check  Discussed with patient all in all doing well no complaints no low blood sugar spells or issues with diabetes.  Blood pressure cholesterol also doing well. No problems with insulin.  Relevant past medical, surgical, family and social history reviewed and updated as indicated. Interim medical history since our last visit reviewed. Allergies and medications reviewed and updated.  Review of Systems  Constitutional: Negative.   HENT: Negative.   Eyes: Negative.   Respiratory: Negative.   Cardiovascular: Negative.   Gastrointestinal: Negative.   Endocrine: Negative.   Genitourinary: Negative.   Musculoskeletal: Negative.   Skin: Negative.   Allergic/Immunologic: Negative.   Neurological: Negative.   Hematological: Negative.   Psychiatric/Behavioral: Negative.     Per HPI unless specifically indicated above     Objective:    There were no vitals taken for this visit.  Wt Readings from Last 3 Encounters:  12/04/18 203 lb 9.6 oz (92.4 kg)  09/03/18 203 lb (92.1 kg)  06/03/18 204 lb (92.5 kg)    Physical Exam  Results for orders placed or performed in visit on 01/60/10  Basic metabolic panel  Result Value Ref Range   Glucose 97 65 - 99 mg/dL   BUN 9 8 - 27 mg/dL   Creatinine, Ser 0.76 0.57 - 1.00 mg/dL   GFR calc non Af Amer 79 >59 mL/min/1.73   GFR calc Af Amer 91 >59 mL/min/1.73   BUN/Creatinine Ratio 12 12 - 28   Sodium 143 134 - 144 mmol/L   Potassium 3.7 3.5 - 5.2 mmol/L   Chloride 104 96 - 106 mmol/L   CO2 23 20 - 29 mmol/L   Calcium 9.2 8.7 - 10.3 mg/dL  LP+ALT+AST Piccolo, Waived  Result Value Ref Range   ALT (SGPT) Piccolo, Waived 19 10 - 47 U/L   AST (SGOT) Piccolo, Waived 22 11 - 38 U/L   Cholesterol Piccolo, Waived 137 <200 mg/dL   HDL Chol  Piccolo, Waived 38 (L) >59 mg/dL   Triglycerides Piccolo,Waived 262 (H) <150 mg/dL   Chol/HDL Ratio Piccolo,Waive 3.6 mg/dL   LDL Chol Calc Piccolo Waived 47 <100 mg/dL   VLDL Chol Calc Piccolo,Waive 52 (H) <30 mg/dL  Bayer DCA Hb A1c Waived  Result Value Ref Range   HB A1C (BAYER DCA - WAIVED) 7.8 (H) <7.0 %  Microalbumin, Urine Waived  Result Value Ref Range   Microalb, Ur Waived 30 (H) 0 - 19 mg/L   Creatinine, Urine Waived 50 10 - 300 mg/dL   Microalb/Creat Ratio 30-300 (H) <30 mg/g      Assessment & Plan:   Problem List Items Addressed This Visit      Cardiovascular and Mediastinum   Hypertension    The current medical regimen is effective;  continue present plan and medications. a      Relevant Medications   amLODipine (NORVASC) 10 MG tablet   atenolol (TENORMIN) 100 MG tablet   spironolactone (ALDACTONE) 25 MG tablet   lovastatin (MEVACOR) 40 MG tablet   Senile purpura (HCC)    The current medical regimen is effective;  continue present plan and medications.       Relevant Medications   amLODipine (NORVASC) 10 MG tablet   atenolol (TENORMIN) 100 MG tablet  spironolactone (ALDACTONE) 25 MG tablet   lovastatin (MEVACOR) 40 MG tablet     Nervous and Auditory   DM type 2 with diabetic peripheral neuropathy (HCC)    The current medical regimen is effective;  continue present plan and medications.       Relevant Medications   lovastatin (MEVACOR) 40 MG tablet   saxagliptin HCl (ONGLYZA) 5 MG TABS tablet   Insulin Glargine-Lixisenatide (SOLIQUA) 100-33 UNT-MCG/ML SOPN   empagliflozin (JARDIANCE) 10 MG TABS tablet   insulin lispro (HUMALOG KWIKPEN) 100 UNIT/ML KwikPen     Other   Hyperlipidemia    The current medical regimen is effective;  continue present plan and medications.       Relevant Medications   amLODipine (NORVASC) 10 MG tablet   atenolol (TENORMIN) 100 MG tablet   spironolactone (ALDACTONE) 25 MG tablet   lovastatin (MEVACOR) 40 MG tablet    saxagliptin HCl (ONGLYZA) 5 MG TABS tablet   Advance care planning    A voluntary discussion about advanced care planning including explanation and discussion of advanced directives was extentively discussed with the patient.  Explained about the healthcare proxy and living will was reviewed and packet with forms with expiration of how to fill them out was given.  Time spent: Encounter 16+ min individuals present: Patient       Other Visit Diagnoses    Essential hypertension, benign       Relevant Medications   amLODipine (NORVASC) 10 MG tablet   atenolol (TENORMIN) 100 MG tablet   spironolactone (ALDACTONE) 25 MG tablet   lovastatin (MEVACOR) 40 MG tablet       Follow up plan: Return in about 6 months (around 12/17/2019) for Hemoglobin A1c.

## 2019-06-17 NOTE — Assessment & Plan Note (Signed)
The current medical regimen is effective;  continue present plan and medications. a 

## 2019-06-17 NOTE — Assessment & Plan Note (Signed)
A voluntary discussion about advanced care planning including explanation and discussion of advanced directives was extentively discussed with the patient.  Explained about the healthcare proxy and living will was reviewed and packet with forms with expiration of how to fill them out was given.  Time spent: Encounter 16+ min individuals present: Patient 

## 2019-06-18 ENCOUNTER — Encounter: Payer: Self-pay | Admitting: Family Medicine

## 2019-06-18 ENCOUNTER — Other Ambulatory Visit: Payer: Self-pay | Admitting: Family Medicine

## 2019-07-18 ENCOUNTER — Ambulatory Visit (INDEPENDENT_AMBULATORY_CARE_PROVIDER_SITE_OTHER): Payer: Medicare Other | Admitting: Family Medicine

## 2019-07-18 ENCOUNTER — Other Ambulatory Visit: Payer: Self-pay

## 2019-07-18 ENCOUNTER — Encounter: Payer: Self-pay | Admitting: Family Medicine

## 2019-07-18 VITALS — BP 111/68 | HR 78 | Temp 98.4°F | Ht 60.5 in | Wt 199.6 lb

## 2019-07-18 DIAGNOSIS — E1142 Type 2 diabetes mellitus with diabetic polyneuropathy: Secondary | ICD-10-CM | POA: Diagnosis not present

## 2019-07-18 DIAGNOSIS — Z0000 Encounter for general adult medical examination without abnormal findings: Secondary | ICD-10-CM | POA: Diagnosis not present

## 2019-07-18 DIAGNOSIS — I10 Essential (primary) hypertension: Secondary | ICD-10-CM | POA: Diagnosis not present

## 2019-07-18 DIAGNOSIS — R8271 Bacteriuria: Secondary | ICD-10-CM | POA: Diagnosis not present

## 2019-07-18 DIAGNOSIS — E7800 Pure hypercholesterolemia, unspecified: Secondary | ICD-10-CM | POA: Diagnosis not present

## 2019-07-18 DIAGNOSIS — L603 Nail dystrophy: Secondary | ICD-10-CM | POA: Diagnosis not present

## 2019-07-18 DIAGNOSIS — S99921A Unspecified injury of right foot, initial encounter: Secondary | ICD-10-CM | POA: Diagnosis not present

## 2019-07-18 MED ORDER — SITAGLIPTIN PHOSPHATE 50 MG PO TABS: 50.0000 mg | ORAL_TABLET | Freq: Every day | ORAL | 1 refills | Status: DC

## 2019-07-18 NOTE — Assessment & Plan Note (Signed)
Will change from onglyza to [place] d/t insurance preference. Recheck A1C

## 2019-07-18 NOTE — Progress Notes (Signed)
BP 111/68 (BP Location: Left Arm, Patient Position: Sitting, Cuff Size: Normal)   Pulse 78   Temp 98.4 F (36.9 C) (Oral)   Ht 5' 0.5" (1.537 m)   Wt 199 lb 9.6 oz (90.5 kg)   SpO2 98%   BMI 38.34 kg/m    Subjective:    [name], female    DOB: 12-Jan-1946, 73 y.o.   MRN: [id number]  HPI: Tami Cummings is a 73 y.o. female presenting on 07/18/2019 for comprehensive medical examination. Current medical complaints include:see below  [facility] in [place], had cataract surgery this past year.   Needing her onglyza changed to [place] per a letter she got from her insurance company.   She currently lives with: Menopausal Symptoms: no  Depression Screen done today and results listed below:  Depression screen [facility] 2/9 05/23/2018 01/29/2018 10/16/2017 07/10/2017 04/10/2017  Decreased Interest 0 0 0 0 0  Down, Depressed, Hopeless 0 0 0 0 0  PHQ - 2 Score 0 0 0 0 0  Altered sleeping - 0 - - -  Tired, decreased energy - 1 - - -  Change in appetite - 0 - - -  Feeling bad or failure about yourself  - 0 - - -  Trouble concentrating - 0 - - -  Moving slowly or fidgety/restless - 0 - - -  Suicidal thoughts - 0 - - -  PHQ-9 Score - 1 - - -    The patient does not have a history of falls. I did complete a risk assessment for falls. A plan of care for falls was documented.   Past Medical History:  Past Medical History:  Diagnosis Date  . Arthritis    knee  . COPD (chronic obstructive pulmonary disease) ([place])   . Diabetes mellitus without complication ([place])    type 2  . Dyspnea   . Hyperlipidemia   . Hypertension    controlled  . Neuromuscular disorder (HCC)    numbness in hands and feet  . Shoulder pain, right   . Wears dentures    upper and lower    Surgical History:  Past Surgical History:  Procedure Laterality Date  . ABDOMINAL HYSTERECTOMY    . BREAST BIOPSY Right 04/12/2016   stereo bx/clip- neg  . CARPAL TUNNEL RELEASE  06/17/00   . CATARACT EXTRACTION W/PHACO Left 09/20/2015   Procedure: CATARACT EXTRACTION PHACO AND INTRAOCULAR LENS PLACEMENT ([place]);  Surgeon: Ronnell Freshwater, MD;  Location: [place];  Service: Ophthalmology;  Laterality: Left;  DIABETIC - insulin  . CATARACT EXTRACTION W/PHACO Right 06/03/2018   Procedure: CATARACT EXTRACTION PHACO AND INTRAOCULAR LENS PLACEMENT ([place]) RIGHT DIABETES;  Surgeon: Eulogio Bear, MD;  Location: [place];  Service: Ophthalmology;  Laterality: Right;  Diabetes-insulin dependent  . CESAREAN SECTION     x2  . CHOLECYSTECTOMY    . EYE SURGERY      Medications:  Current Outpatient Medications on File Prior to Visit  Medication Sig  . amLODipine (NORVASC) 10 MG tablet Take 1 tablet (10 mg total) by mouth daily.  Marland Kitchen aspirin EC 81 MG tablet Take 81 mg by mouth daily. am  . atenolol (TENORMIN) 100 MG tablet Take 1 tablet (100 mg total) by mouth daily.  . BD PEN NEEDLE MICRO U/F 32G X 6 MM MISC USE 4 TO 5 DAILY WITH INSULIN USE  . Blood Glucose Monitoring Suppl (ONE TOUCH ULTRA 2) w/Device KIT USE ONCE TO  TWICE DAILY  . empagliflozin (JARDIANCE) 10 MG TABS tablet Take 10 mg by mouth daily.  Marland Kitchen glucose blood (ONE TOUCH ULTRA TEST) test strip USE ONE TO TWO STRIPS TO CHECK GLUCOSE ONCE DAILY  . Insulin Glargine-Lixisenatide (SOLIQUA) 100-33 UNT-MCG/ML SOPN Inject 60 Units into the skin daily. Start 30 u increase 2 u every 2 days as needed max 60u a day  . insulin lispro (HUMALOG KWIKPEN) 100 UNIT/ML KwikPen Inject 0.25 mLs (25 Units total) into the skin 2 (two) times daily before a meal.  . lovastatin (MEVACOR) 40 MG tablet Take 1 tablet (40 mg total) by mouth daily with breakfast.  . spironolactone (ALDACTONE) 25 MG tablet Take 1 tablet (25 mg total) by mouth daily.   No current facility-administered medications on file prior to visit.     Allergies:  Allergies  Allergen Reactions  . Actos [Pioglitazone] Other (See Comments)    CHF  .  Amlodipine Swelling    Leg swelling   . Benazepril Other (See Comments)    angioedema  . Hydrochlorothiazide Other (See Comments)    angioedema  . Metformin And Related Diarrhea    Social History:  Social History   Socioeconomic History  . Marital status: Single    Spouse name: Not on file  . Number of children: Not on file  . Years of education: Not on file  . Highest education level: High school graduate  Occupational History  . Occupation: retired   Scientific laboratory technician  . Financial resource strain: Not hard at all  . Food insecurity    Worry: Never true    Inability: Never true  . Transportation needs    Medical: No    Non-medical: No  Tobacco Use  . Smoking status: Former Smoker    Packs/day: 1.00    Years: 20.00    Pack years: 20.00    Types: Cigarettes    Quit date: 08/26/2013    Years since quitting: 5.8  . Smokeless tobacco: Never Used  Substance and Sexual Activity  . Alcohol use: No    Alcohol/week: 0.0 standard drinks  . Drug use: No  . Sexual activity: Not on file  Lifestyle  . Physical activity    Days per week: 0 days    Minutes per session: 0 min  . Stress: Not at all  Relationships  . Social connections    Talks on phone: More than three times a week    Gets together: More than three times a week    Attends religious service: Never    Active member of club or organization: No    Attends meetings of clubs or organizations: Never    Relationship status: Divorced  . Intimate partner violence    Fear of current or ex partner: No    Emotionally abused: No    Physically abused: No    Forced sexual activity: No  Other Topics Concern  . Not on file  Social History Narrative   Daughter lives with her currently    Social History   Tobacco Use  Smoking Status Former Smoker  . Packs/day: 1.00  . Years: 20.00  . Pack years: 20.00  . Types: Cigarettes  . Quit date: 08/26/2013  . Years since quitting: 5.8  Smokeless Tobacco Never Used   Social  History   Substance and Sexual Activity  Alcohol Use No  . Alcohol/week: 0.0 standard drinks    Family History:  Family History  Problem Relation Age of Onset  . Heart  disease Father   . Cancer Sister   . Cancer Brother   . Breast cancer Neg Hx     Past medical history, surgical history, medications, allergies, family history and social history reviewed with patient today and changes made to appropriate areas of the chart.   Review of Systems - General ROS: negative Psychological ROS: negative Ophthalmic ROS: negative ENT ROS: negative Allergy and Immunology ROS: negative Hematological and Lymphatic ROS: negative Endocrine ROS: negative Breast ROS: negative for breast lumps Respiratory ROS: no cough, shortness of breath, or wheezing Cardiovascular ROS: no chest pain or dyspnea on exertion Gastrointestinal ROS: no abdominal pain, change in bowel habits, or black or bloody stools Genito-Urinary ROS: no dysuria, trouble voiding, or hematuria Musculoskeletal ROS: negative Neurological ROS: no TIA or stroke symptoms Dermatological ROS: negative All other ROS negative except what is listed above and in the HPI.      Objective:    BP 111/68 (BP Location: Left Arm, Patient Position: Sitting, Cuff Size: Normal)   Pulse 78   Temp 98.4 F (36.9 C) (Oral)   Ht 5' 0.5" (1.537 m)   Wt 199 lb 9.6 oz (90.5 kg)   SpO2 98%   BMI 38.34 kg/m   Wt Readings from Last 3 Encounters:  07/18/19 199 lb 9.6 oz (90.5 kg)  12/04/18 203 lb 9.6 oz (92.4 kg)  09/03/18 203 lb (92.1 kg)    Physical Exam Vitals signs and nursing note reviewed.  Constitutional:      General: She is not in acute distress.    Appearance: She is well-developed. She is obese. She is not ill-appearing.  HENT:     Head: Atraumatic.     Right Ear: External ear normal.     Left Ear: External ear normal.     Nose: Nose normal.     Mouth/Throat:     Pharynx: No oropharyngeal exudate.  Eyes:     General: No scleral  icterus.    Extraocular Movements: Extraocular movements intact.     Conjunctiva/sclera: Conjunctivae normal.     Pupils: Pupils are equal, round, and reactive to light.  Neck:     Musculoskeletal: Normal range of motion and neck supple.     Thyroid: No thyromegaly.  Cardiovascular:     Rate and Rhythm: Normal rate and regular rhythm.     Heart sounds: Normal heart sounds.  Pulmonary:     Effort: Pulmonary effort is normal. No respiratory distress.     Breath sounds: Normal breath sounds.  Abdominal:     General: Bowel sounds are normal.     Palpations: Abdomen is soft. There is no mass.     Tenderness: There is no abdominal tenderness.  Musculoskeletal: Normal range of motion.        General: No tenderness.  Lymphadenopathy:     Cervical: No cervical adenopathy.  Skin:    General: Skin is warm and dry.     Findings: No rash.     Comments: B/l feet dry, flaking 4th toenail right foot with majority of nail missing, surrounding tissue erythematous  Multiple thickened and misshapen toenails b/l feet  Neurological:     Mental Status: She is alert and oriented to person, place, and time.     Cranial Nerves: No cranial nerve deficit.  Psychiatric:        Mood and Affect: Mood normal.        Behavior: Behavior normal.        Thought Content: Thought content normal.  Judgment: Judgment normal.     Results for orders placed or performed in visit on 01/74/94  Basic metabolic panel  Result Value Ref Range   Glucose 97 65 - 99 mg/dL   BUN 9 8 - 27 mg/dL   Creatinine, Ser 0.76 0.57 - 1.00 mg/dL   GFR calc non Af Amer 79 >59 mL/min/1.73   GFR calc Af Amer 91 >59 mL/min/1.73   BUN/Creatinine Ratio 12 12 - 28   Sodium 143 134 - 144 mmol/L   Potassium 3.7 3.5 - 5.2 mmol/L   Chloride 104 96 - 106 mmol/L   CO2 23 20 - 29 mmol/L   Calcium 9.2 8.7 - 10.3 mg/dL  LP+ALT+AST Piccolo, Waived  Result Value Ref Range   ALT (SGPT) Piccolo, Waived 19 10 - 47 U/L   AST (SGOT) Piccolo,  Waived 22 11 - 38 U/L   Cholesterol Piccolo, Waived 137 <200 mg/dL   HDL Chol Piccolo, Waived 38 (L) >59 mg/dL   Triglycerides Piccolo,Waived 262 (H) <150 mg/dL   Chol/HDL Ratio Piccolo,Waive 3.6 mg/dL   LDL Chol Calc Piccolo Waived 47 <100 mg/dL   VLDL Chol Calc Piccolo,Waive 52 (H) <30 mg/dL  Bayer DCA Hb A1c Waived  Result Value Ref Range   HB A1C (BAYER DCA - WAIVED) 7.8 (H) <7.0 %  Microalbumin, Urine Waived  Result Value Ref Range   Microalb, Ur Waived 30 (H) 0 - 19 mg/L   Creatinine, Urine Waived 50 10 - 300 mg/dL   Microalb/Creat Ratio 30-300 (H) <30 mg/g      Assessment & Plan:   Problem List Items Addressed This Visit      Nervous and Auditory   DM type 2 with diabetic peripheral neuropathy (HCC)    Will change from onglyza to [place] d/t insurance preference. Recheck A1C      Relevant Medications   sitaGLIPtin (JANUVIA) 50 MG tablet   Other Relevant Orders   HgB A1c     Other   Hyperlipidemia   Relevant Orders   Lipid Panel w/o Chol/HDL Ratio    Other Visit Diagnoses    Injury of toenail of right foot, initial encounter    -  Primary   Discussed using antiseptic solution BID and applying neosporin, keeping area covered. F/u in 1-2 weeks if worsening. Infection signs reviewed   Annual physical exam       Nail dystrophy       Grinding own toenails with a dog nail tool, multiple are deformed and growing into skin. Refuses podiatry referral. Nail care/precautions reviewed at length   Essential hypertension, benign       Relevant Orders   Comprehensive metabolic panel   CBC with Differential/Platelet   UA/M w/rflx Culture, Routine       Follow up plan: Return in about 6 months (around 01/18/2020) for 6 month f/u with MAC.   LABORATORY TESTING:  - Pap smear: not applicable  IMMUNIZATIONS:   - Tdap: Tetanus vaccination status reviewed: last tetanus booster within 10 years. - Influenza: Postponed to flu season - Pneumovax: Up to date - Prevnar: Up to  date - HPV: Not applicable - Zostavax vaccine: Up to date  SCREENING: -Mammogram: Up to date  - Colonoscopy: Up to date  - Bone Density: Up to date   PATIENT COUNSELING:   Advised to take 1 mg of folate supplement per day if capable of pregnancy.   Sexuality: Discussed sexually transmitted diseases, partner selection, use of condoms, avoidance of unintended pregnancy  and contraceptive alternatives.   Advised to avoid cigarette smoking.  I discussed with the patient that most people either abstain from alcohol or drink within safe limits (<=14/week and <=4 drinks/occasion for males, <=7/weeks and <= 3 drinks/occasion for females) and that the risk for alcohol disorders and other health effects rises proportionally with the number of drinks per week and how often a drinker exceeds daily limits.  Discussed cessation/primary prevention of drug use and availability of treatment for abuse.   Diet: Encouraged to adjust caloric intake to maintain  or achieve ideal body weight, to reduce intake of dietary saturated fat and total fat, to limit sodium intake by avoiding high sodium foods and not adding table salt, and to maintain adequate dietary potassium and calcium preferably from fresh fruits, vegetables, and low-fat dairy products.    stressed the importance of regular exercise  Injury prevention: Discussed safety belts, safety helmets, smoke detector, smoking near bedding or upholstery.   Dental health: Discussed importance of regular tooth brushing, flossing, and dental visits.    NEXT PREVENTATIVE PHYSICAL DUE IN 1 YEAR. Return in about 6 months (around 01/18/2020) for 6 month f/u with MAC.

## 2019-07-19 LAB — COMPREHENSIVE METABOLIC PANEL
ALT: 16 IU/L (ref 0–32)
AST: 15 IU/L (ref 0–40)
Albumin/Globulin Ratio: 1.8 (ref 1.2–2.2)
Albumin: 4.2 g/dL (ref 3.7–4.7)
Alkaline Phosphatase: 83 IU/L (ref 39–117)
BUN/Creatinine Ratio: 14 (ref 12–28)
BUN: 13 mg/dL (ref 8–27)
Bilirubin Total: 0.2 mg/dL (ref 0.0–1.2)
CO2: 21 mmol/L (ref 20–29)
Calcium: 9.3 mg/dL (ref 8.7–10.3)
Chloride: 106 mmol/L (ref 96–106)
Creatinine, Ser: 0.96 mg/dL (ref 0.57–1.00)
GFR calc Af Amer: 69 mL/min/{1.73_m2} (ref >59)
GFR calc non Af Amer: 60 mL/min/{1.73_m2} (ref >59)
Globulin, Total: 2.4 g/dL (ref 1.5–4.5)
Glucose: 111 mg/dL — ABNORMAL HIGH (ref 65–99)
Potassium: 4.6 mmol/L (ref 3.5–5.2)
Sodium: 142 mmol/L (ref 134–144)
Total Protein: 6.6 g/dL (ref 6.0–8.5)

## 2019-07-19 LAB — CBC WITH DIFFERENTIAL/PLATELET
Basophils Absolute: 0.1 10*3/uL (ref 0.0–0.2)
Basos: 1 %
EOS (ABSOLUTE): 0.1 10*3/uL (ref 0.0–0.4)
Eos: 1 %
Hematocrit: 42.1 % (ref 34.0–46.6)
Hemoglobin: 13.7 g/dL (ref 11.1–15.9)
Immature Grans (Abs): 0.1 10*3/uL (ref 0.0–0.1)
Immature Granulocytes: 1 %
Lymphocytes Absolute: 1.5 10*3/uL (ref 0.7–3.1)
Lymphs: 15 %
MCH: 25.8 pg — ABNORMAL LOW (ref 26.6–33.0)
MCHC: 32.5 g/dL (ref 31.5–35.7)
MCV: 79 fL (ref 79–97)
Monocytes Absolute: 0.8 10*3/uL (ref 0.1–0.9)
Monocytes: 8 %
Neutrophils Absolute: 7.4 10*3/uL — ABNORMAL HIGH (ref 1.4–7.0)
Neutrophils: 74 %
Platelets: 212 10*3/uL (ref 150–450)
RBC: 5.3 x10E6/uL — ABNORMAL HIGH (ref 3.77–5.28)
RDW: 14.3 % (ref 11.7–15.4)
WBC: 10 10*3/uL (ref 3.4–10.8)

## 2019-07-19 LAB — HEMOGLOBIN A1C
Est. average glucose Bld gHb Est-mCnc: 194 mg/dL
Hgb A1c MFr Bld: 8.4 % — ABNORMAL HIGH (ref 4.8–5.6)

## 2019-07-19 LAB — LIPID PANEL W/O CHOL/HDL RATIO
Cholesterol, Total: 142 mg/dL (ref 100–199)
HDL: 36 mg/dL — ABNORMAL LOW (ref >39)
LDL Calculated: 75 mg/dL (ref 0–99)
Triglycerides: 153 mg/dL — ABNORMAL HIGH (ref 0–149)
VLDL Cholesterol Cal: 31 mg/dL (ref 5–40)

## 2019-07-21 ENCOUNTER — Telehealth: Payer: Self-pay | Admitting: Family Medicine

## 2019-07-21 MED ORDER — SULFAMETHOXAZOLE-TRIMETHOPRIM 800-160 MG PO TABS: 1.0000 | ORAL_TABLET | Freq: Two times a day (BID) | ORAL | 0 refills | Status: DC

## 2019-07-21 NOTE — Telephone Encounter (Signed)
Please let her know that her urine testing from [facility] showed that she has a UTI for which I have sent over some antibiotics. Dr. Jeananne Rama should be in touch about the remainder of her results

## 2019-07-21 NOTE — Telephone Encounter (Signed)
Patient notified

## 2019-07-24 LAB — UA/M W/RFLX CULTURE, ROUTINE
Bilirubin, UA: NEGATIVE
Ketones, UA: NEGATIVE
Nitrite, UA: NEGATIVE
Protein,UA: NEGATIVE
Specific Gravity, UA: 1.01 (ref 1.005–1.030)
Urobilinogen, Ur: 0.2 mg/dL (ref 0.2–1.0)
pH, UA: 5 (ref 5.0–7.5)

## 2019-07-24 LAB — MICROSCOPIC EXAMINATION: WBC, UA: >30 /hpf — AB (ref 0–5)

## 2019-07-24 LAB — URINE CULTURE, REFLEX

## 2019-09-04 ENCOUNTER — Telehealth: Payer: Self-pay | Admitting: Family Medicine

## 2019-09-04 NOTE — Chronic Care Management (AMB) (Signed)
Chronic Care Management   Note  09/04/2019 Name: Tami Cummings MRN: [id number] DOB: 04/23/1946  Tami Cummings is a 73 y.o. year old female who is a primary care patient of Crissman, Jeannette How, MD. I reached out to [name] by phone today in response to a referral sent by Tami Cummings's health plan.    [name] was given information about Chronic Care Management services today including:  1. CCM service includes personalized support from designated clinical staff supervised by her physician, including individualized plan of care and coordination with other care providers 2. 24/7 contact phone numbers for assistance for urgent and routine care needs. 3. Service will only be billed when office clinical staff spend 20 minutes or more in a month to coordinate care. 4. Only one practitioner may furnish and bill the service in a calendar month. 5. The patient may stop CCM services at any time (effective at the end of the month) by phone call to the office staff. 6. The patient will be responsible for cost sharing (co-pay) of up to 20% of the service fee (after annual deductible is met).  Patient agreed to services and verbal consent obtained.   Follow up plan: Telephone appointment with CCM team member scheduled for: 10/14/2019  Howard  ??[email or web address]   ??[phone]

## 2019-09-15 ENCOUNTER — Other Ambulatory Visit: Payer: Self-pay | Admitting: Family Medicine

## 2019-10-14 ENCOUNTER — Ambulatory Visit (INDEPENDENT_AMBULATORY_CARE_PROVIDER_SITE_OTHER): Payer: Medicare Other | Admitting: Pharmacist

## 2019-10-14 ENCOUNTER — Telehealth: Payer: Self-pay | Admitting: Pharmacist

## 2019-10-14 DIAGNOSIS — E1142 Type 2 diabetes mellitus with diabetic polyneuropathy: Secondary | ICD-10-CM

## 2019-10-14 NOTE — Chronic Care Management (AMB) (Signed)
Chronic Care Management   Note  10/14/2019 Name: Tami Cummings MRN: [id number] DOB: 09-May-1946   Subjective:  Tami Cummings is a 73 y.o. year old female who is a primary care patient of Crissman, Jeannette How, MD. The CCM team was consulted for assistance with chronic disease management and care coordination needs.    Contacted patient for medication management support.   Tami Cummings was given information about Chronic Care Management services today including:  1. CCM service includes personalized support from designated clinical staff supervised by her physician, including individualized plan of care and coordination with other care providers 2. 24/7 contact phone numbers for assistance for urgent and routine care needs. 3. Service will only be billed when office clinical staff spend 20 minutes or more in a month to coordinate care. 4. Only one practitioner may furnish and bill the service in a calendar month. 5. The patient may stop CCM services at any time (effective at the end of the month) by phone call to the office staff. 6. The patient will be responsible for cost sharing (co-pay) of up to 20% of the service fee (after annual deductible is met).  Patient agreed to services and verbal consent obtained.   Review of patient status, including review of consultants reports, laboratory and other test data, was performed as part of comprehensive evaluation and provision of chronic care management services.   Objective:  Lab Results  Component Value Date   CREATININE 0.96 07/18/2019   CREATININE 0.76 12/04/2018   CREATININE 0.93 05/23/2018    Lab Results  Component Value Date   HGBA1C 8.4 (H) 07/18/2019       Component Value Date/Time   CHOL 142 07/18/2019 1107   CHOL 137 12/04/2018 1106   TRIG 153 (H) 07/18/2019 1107   TRIG 262 (H) 12/04/2018 1106   HDL 36 (L) 07/18/2019 1107   CHOLHDL 4.0 04/10/2017 1347   VLDL 52 (H) 12/04/2018 1106   LDLCALC 75 07/18/2019  1107    Clinical ASCVD: No  The 10-year ASCVD risk score Tami Cummings DC Jr., et al., 2013) is: 21.2%   Values used to calculate the score:     Age: 48 years     Sex: Female     Is Non-Hispanic African American: No     Diabetic: Yes     Tobacco smoker: No     Systolic Blood Pressure: 696 mmHg     Is BP treated: Yes     HDL Cholesterol: 36 mg/dL     Total Cholesterol: 142 mg/dL    BP Readings from Last 3 Encounters:  07/18/19 111/68  05/26/19 128/70  12/04/18 138/65    Allergies  Allergen Reactions  . Actos [Pioglitazone] Other (See Comments)    CHF  . Amlodipine Swelling    Leg swelling   . Benazepril Other (See Comments)    angioedema  . Hydrochlorothiazide Other (See Comments)    angioedema  . Metformin And Related Diarrhea    Medications Reviewed Today    Reviewed by De Hollingshead, [facility] (Pharmacist) on 10/14/19 at [phone]  Med List Status: <None>  Medication Order Taking? Sig Documenting Provider Last Dose Status Informant  amLODipine (NORVASC) 10 MG tablet [id number] Yes Take 1 tablet (10 mg total) by mouth daily. Tami Maple, MD Taking Active   aspirin EC 81 MG tablet [id number] Yes Take 81 mg by mouth daily. am [provider] Taking Active Self  atenolol (TENORMIN) 100 MG tablet [id number] Yes Take 1  tablet (100 mg total) by mouth daily. Tami Maple, MD Taking Active            Med Note De Hollingshead   Tue Oct 14, 2019  9:27 AM) QHS  BD PEN NEEDLE MICRO U/F 32G X 6 MM MISC [id number] Yes USE 4 TO 5 DAILY WITH INSULIN USE Tami Maple, MD Taking Active   Blood Glucose Monitoring Suppl (ONE TOUCH ULTRA 2) w/Device KIT [id number] Yes USE ONCE TO TWICE DAILY Crissman, Jeannette How, MD Taking Active   empagliflozin (JARDIANCE) 10 MG TABS tablet [id number] No Take 10 mg by mouth daily.  Patient not taking: Reported on 10/14/2019   Tami Maple, MD Not Taking Active   glucose blood (ONE TOUCH ULTRA TEST) test strip [id number] Yes USE ONE TO TWO STRIPS  TO CHECK GLUCOSE ONCE DAILY Tami Maple, MD Taking Active   Insulin Glargine-Lixisenatide [facility]) 100-33 UNT-MCG/ML SOPN [id number] Yes Inject 60 Units into the skin daily. Start 30 u increase 2 u every 2 days as needed max 60u a day Crissman, Jeannette How, MD Taking Active            Med Note [name], [place] Oct 14, 2019  9:25 AM) 50 units QHS  insulin lispro (HUMALOG KWIKPEN) 100 UNIT/ML KwikPen [id number] Yes Inject 0.25 mLs (25 Units total) into the skin 2 (two) times daily before a meal. Crissman, Jeannette How, MD Taking Active   lovastatin (MEVACOR) 40 MG tablet [id number] Yes Take 1 tablet (40 mg total) by mouth daily with breakfast. Tami Maple, MD Taking Active            Med Note [name], Tami Cummings   Tue Oct 14, 2019  9:28 AM) QAM  saxagliptin HCl (ONGLYZA) 5 MG TABS tablet [id number] Yes Take 5 mg by mouth daily. [provider] Taking Active   sitaGLIPtin (JANUVIA) 50 MG tablet [id number] No Take 1 tablet (50 mg total) by mouth daily.  Patient not taking: Reported on 10/14/2019   Volney American, PA-C Not Taking Active   spironolactone (ALDACTONE) 25 MG tablet [id number] Yes Take 1 tablet (25 mg total) by mouth daily. Tami Maple, MD Taking Active            Med Note Tami Cummings Oct 14, 2019  9:28 AM) [name]        Discontinued 10/14/19 [phone] (Completed Course)            Assessment:   Goals Addressed            This Visit's Progress     Patient Stated   . "I want to stay healthy" (pt-stated)       Current Barriers:  . Diabetes: uncontrolled; most recent A1c 8.4%  . Current antihyperglycemic regimen: Jardiance 10 mg daily (has been out for ~3 days, needs a refill), Soliquia 50 units daily, Onglyza 5 mg daily - has not needed to switch to Januvia yet d/t the supply she has at home o Intolerance to metformin - diarrhea and nausea . Reports hypoglycemic symptoms at least a month ago; reports reading ~70s, can't remember time of day.  Notes occasional lows . Current meal patterns: o Breakfast: Sausage and egg sandwich; bowl of cereal o Lunch: Sandwich o Supper: whatever her daughter cooks (daughter and 3 grandkids live with her) o Snacks: Occasionally potato chips, otherwise infrequent Engineer, petroleum o Drinks: Pepsi zero; will drink sweet tea if she is out  of pepsi;  o Desserts: tries to avoid, but notes that ice cream is her weakness  . Current exercise: Nothing really; walks to the mailbox occasionally, is in the house most of the day  . Current blood glucose readings: this morning was 140; 140-150s; throughout the day  . Cardiovascular risk reduction: o Current hypertensive regimen: amlodipine 10 mg daily, spironolactone 25 mg QAM, atenolol 100 mg QPM; BP this morning 128/76, HR 66 o Current hyperlipidemia regimen: lovastatin 40 mg daily, last LDL 75   Pharmacist Clinical Goal(s):  Marland Kitchen Over the next 90 days, patient will work with PharmD and primary care provider to address optimized medication management  Interventions: . Comprehensive medication review performed, medication list updated in electronic medical record . Educated on goal A1c, goal fasting glucose, and goal post-prandial glucose.  Marland Kitchen Discussed duplicative MoA of DPP4 and GLP1. Unlikely that patient is receiving additional benefit from [place] in addition to lixisenatide in [place]. Will collaborate w/ Merrie Roof, PA to consider d/c DPP4.  . Patient requests refill on Jardiance, however, as patient's glucose and A1c not at goal, consider increasing Jardiance to 25 mg daily. Denies any issues w/ genitourinary sx. Will collaborate w/ Merrie Roof on this.  . Discussed ways to reduce carbohydrate intake; including choosing unsweet or 1/2 sweet 1/2 unsweet tea; small portion sizes of ice cream when she does eat it. . Recommended patient start checking glucose BID - fasting and 2 hours after a meal. She verbalized understanding . Mailed AVS with glucose log and  CCM team contact information  Patient Self Care Activities:  . Patient will check blood glucose BID, document, and provide at future appointments . Patient will take medications as prescribed . Patient will report any questions or concerns to provider   Initial goal documentation        Plan: - Will collaborate with Merrie Roof, PA on the above - Will outreach patient in the next 4-6 weeks for continued medication management support  [name], PharmD Clinical Pharmacist [place] [phone]

## 2019-10-14 NOTE — Telephone Encounter (Signed)
Patient requests refill on Jardiance, she has been out for ~3 days.   However, A1c is uncontrolled and reports uncontrolled fasting and random BG. Consider increasing to Jardiance 25 mg daily.   Patient due for lab follow up and she would like to see where her A1c is. Will collaborate with Merrie Roof on the above recommendations to see if she would like patient to schedule an appointment with her in the next few weeks for diabetes

## 2019-10-14 NOTE — Patient Instructions (Addendum)
Tami Cummings,   It was great talking with you today!  Like we said, our goal is to help you get your A1c to less than 7%. This generally correlates with fasting sugar readings less than 130, and 2 hour after meal sugar readings less than 180. If you could start checking your sugar about 2 hours after breakfast OR about 2 hours after supper, that would really help [place] figure out the best ways to tweak your medication regimen. Enclosed is a blood sugar log so you can write down these numbers and we can talk about them in the future.   Remember - small portion sizes of chips or ice cream! I've enclosed one of my favorite handouts - take a look at the very last page. This shows your appropriate portion sizes of foods that can impact your sugar readings - breads, pastas, some vegetables, and fruits.   Feel free to give me a call with any questions or concerns! Otherwise, I'll call you in November to touch base.   Visit Information  Goals Addressed            This Visit's Progress     Patient Stated   . "I want to stay healthy" (pt-stated)       Current Barriers:  . Diabetes: uncontrolled; most recent A1c 8.4%  . Current antihyperglycemic regimen: Jardiance 10 mg daily (has been out for ~3 days, needs a refill), Soliquia 50 units daily, Onglyza 5 mg daily - has not needed to switch to Januvia yet d/t the supply she has at home o Intolerance to metformin - diarrhea and nausea . Reports hypoglycemic symptoms at least a month ago; reports reading ~70s, can't remember time of day. Notes occasional lows . Current meal patterns: o Breakfast: Sausage and egg sandwich; bowl of cereal o Lunch: Sandwich o Supper: whatever her daughter cooks (daughter and 3 grandkids live with her) o Snacks: Occasionally potato chips, otherwise infrequent [employer], [employer] o Drinks: Pepsi zero; will drink sweet tea if she is out of pepsi;  o Desserts: tries to avoid, but notes that ice cream is her weakness  . Current exercise:  Nothing really; walks to the mailbox occasionally, is in the house most of the day  . Current blood glucose readings: this morning was 140; 140-150s; throughout the day  . Cardiovascular risk reduction: o Current hypertensive regimen: amlodipine 10 mg daily, spironolactone 25 mg QAM, atenolol 100 mg QPM; BP this morning 128/76, HR 66 o Current hyperlipidemia regimen: lovastatin 40 mg daily, last LDL 75   Pharmacist Clinical Goal(s):  Marland Kitchen Over the next 90 days, patient will work with PharmD and primary care provider to address optimized medication management  Interventions: . Comprehensive medication review performed, medication list updated in electronic medical record . Educated on goal A1c, goal fasting glucose, and goal post-prandial glucose.  Marland Kitchen Discussed duplicative MoA of DPP4 and GLP1. Unlikely that patient is receiving additional benefit from [place] in addition to lixisenatide in [place]. Will collaborate w/ Merrie Roof, PA to consider d/c DPP4.  . Patient requests refill on Jardiance, however, as patient's glucose and A1c not at goal, consider increasing Jardiance to 25 mg daily. Denies any issues w/ genitourinary sx. Will collaborate w/ Merrie Roof on this.  . Discussed ways to reduce carbohydrate intake; including choosing unsweet or 1/2 sweet 1/2 unsweet tea; small portion sizes of ice cream when she does eat it. . Recommended patient start checking glucose BID - fasting and 2 hours after a meal. She  verbalized understanding . Mailed AVS with glucose log and CCM team contact information  Patient Self Care Activities:  . Patient will check blood glucose BID, document, and provide at future appointments . Patient will take medications as prescribed . Patient will report any questions or concerns to provider   Initial goal documentation        Tami Cummings was given information about Chronic Care Management services today including:  1. CCM service includes personalized  support from designated clinical staff supervised by her physician, including individualized plan of care and coordination with other care providers 2. 24/7 contact phone numbers for assistance for urgent and routine care needs. 3. Service will only be billed when office clinical staff spend 20 minutes or more in a month to coordinate care. 4. Only one practitioner may furnish and bill the service in a calendar month. 5. The patient may stop CCM services at any time (effective at the end of the month) by phone call to the office staff. 6. The patient will be responsible for cost sharing (co-pay) of up to 20% of the service fee (after annual deductible is met).  Patient agreed to services and verbal consent obtained.   Print copy of patient instructions provided.   Plan: - Will collaborate with Merrie Roof, PA on the above - Will outreach patient in the next 4-6 weeks for continued medication management support  [name], PharmD Clinical Pharmacist [place] [phone]

## 2019-10-15 MED ORDER — JARDIANCE 25 MG PO TABS
25.0000 mg | ORAL_TABLET | Freq: Every day | ORAL | 2 refills | Status: DC
Start: 2019-10-15 — End: 2020-03-22

## 2019-10-15 NOTE — Telephone Encounter (Signed)
Needs appt, will refill and increase jardiance to bridge until appt so she will now be on 25 mg of it

## 2019-10-16 NOTE — Telephone Encounter (Signed)
Called pt scheduled for Monday 11/2

## 2019-10-20 ENCOUNTER — Encounter: Payer: Self-pay | Admitting: Family Medicine

## 2019-10-20 ENCOUNTER — Other Ambulatory Visit: Payer: Self-pay

## 2019-10-20 ENCOUNTER — Ambulatory Visit (INDEPENDENT_AMBULATORY_CARE_PROVIDER_SITE_OTHER): Payer: Medicare Other | Admitting: Family Medicine

## 2019-10-20 VITALS — BP 132/61 | HR 77 | Temp 98.2°F | Wt 197.0 lb

## 2019-10-20 DIAGNOSIS — E1142 Type 2 diabetes mellitus with diabetic polyneuropathy: Secondary | ICD-10-CM | POA: Diagnosis not present

## 2019-10-20 LAB — UA/M W/RFLX CULTURE, ROUTINE
Bilirubin, UA: NEGATIVE
Ketones, UA: NEGATIVE
Leukocytes,UA: NEGATIVE
Nitrite, UA: NEGATIVE
Protein,UA: NEGATIVE
RBC, UA: NEGATIVE
Specific Gravity, UA: <1.005 — ABNORMAL LOW (ref 1.005–1.030)
Urobilinogen, Ur: 0.2 mg/dL (ref 0.2–1.0)
pH, UA: 5 (ref 5.0–7.5)

## 2019-10-20 NOTE — Progress Notes (Signed)
BP 132/61   Pulse 77   Temp 98.2 F (36.8 C) (Oral)   Wt 197 lb (89.4 kg)   SpO2 96%   BMI 37.84 kg/m    Subjective:    [name], female    DOB: [birth year], 73 y.o.   MRN: [id number]  HPI: Tami Cummings is a 73 y.o. female  Chief Complaint  Patient presents with  . Diabetes    pt states she has not had a recent eye exam   Here today for DM f/u. Taking 50 units soliqua, and 25 units twice daily of humalog in addition to [place], [place], and [place] which was just increased to 25 mg several days ago. Tolerating her medicines well, no known low blood sugar spells. Meter is broken, has not been able to check home BSs. Has not been watching diet or exercising.   Does have polyuria but states this is all the time. Does not watch what she eats  Relevant past medical, surgical, family and social history reviewed and updated as indicated. Interim medical history since our last visit reviewed. Allergies and medications reviewed and updated.  Review of Systems  Per HPI unless specifically indicated above     Objective:    BP 132/61   Pulse 77   Temp 98.2 F (36.8 C) (Oral)   Wt 197 lb (89.4 kg)   SpO2 96%   BMI 37.84 kg/m   Wt Readings from Last 3 Encounters:  10/20/19 197 lb (89.4 kg)  07/18/19 199 lb 9.6 oz (90.5 kg)  12/04/18 203 lb 9.6 oz (92.4 kg)    Physical Exam Vitals signs and nursing note reviewed.  Constitutional:      Appearance: Normal appearance. She is not ill-appearing.  HENT:     Head: Atraumatic.  Eyes:     Extraocular Movements: Extraocular movements intact.     Conjunctiva/sclera: Conjunctivae normal.  Neck:     Musculoskeletal: Normal range of motion and neck supple.  Cardiovascular:     Rate and Rhythm: Normal rate and regular rhythm.     Heart sounds: Normal heart sounds.  Pulmonary:     Effort: Pulmonary effort is normal.     Breath sounds: Normal breath sounds.  Musculoskeletal: Normal range of motion.   Skin:    General: Skin is warm and dry.  Neurological:     Mental Status: She is alert and oriented to person, place, and time.  Psychiatric:        Mood and Affect: Mood normal.        Thought Content: Thought content normal.        Judgment: Judgment normal.     Results for orders placed or performed in visit on 10/20/19  Comprehensive metabolic panel  Result Value Ref Range   Glucose 137 (H) 65 - 99 mg/dL   BUN 15 8 - 27 mg/dL   Creatinine, Ser 0.87 0.57 - 1.00 mg/dL   GFR calc non Af Amer 67 >59 mL/min/1.73   GFR calc Af Amer 77 >59 mL/min/1.73   BUN/Creatinine Ratio 17 12 - 28   Sodium 141 134 - 144 mmol/L   Potassium 4.2 3.5 - 5.2 mmol/L   Chloride 104 96 - 106 mmol/L   CO2 22 20 - 29 mmol/L   Calcium 9.6 8.7 - 10.3 mg/dL   Total Protein 6.8 6.0 - 8.5 g/dL   Albumin 4.4 3.7 - 4.7 g/dL   Globulin, Total 2.4 1.5 - 4.5 g/dL  Albumin/Globulin Ratio 1.8 1.2 - 2.2   Bilirubin Total 0.2 0.0 - 1.2 mg/dL   Alkaline Phosphatase 98 39 - 117 IU/L   AST 11 0 - 40 IU/L   ALT 15 0 - 32 IU/L  UA/M w/rflx Culture, Routine   Specimen: Urine   URINE  Result Value Ref Range   Specific Gravity, UA <1.005 (L) 1.005 - 1.030   pH, UA 5.0 5.0 - 7.5   Color, UA Yellow Yellow   Appearance Ur Clear Clear   Leukocytes,UA Negative Negative   Protein,UA Negative Negative/Trace   Glucose, UA 3+ (A) Negative   Ketones, UA Negative Negative   RBC, UA Negative Negative   Bilirubin, UA Negative Negative   Urobilinogen, Ur 0.2 0.2 - 1.0 mg/dL   Nitrite, UA Negative Negative  HgB A1c  Result Value Ref Range   Hgb A1c MFr Bld 9.0 (H) 4.8 - 5.6 %   Est. average glucose Bld gHb Est-mCnc 212 mg/dL      Assessment & Plan:   Problem List Items Addressed This Visit      Endocrine   DM type 2 with diabetic peripheral neuropathy ([place]) - Primary    Recheck A1C, will have CCM Nursing reach out for diet and diabetic education. Order faxed for new testing supplies so she can be following her home  glucose levels. Discussed significant importance of this when on insulin therapy in particular. Will work on diet and see how increased jardiance benefits her A1C, recheck in 3 months. Will not adjust insulins until getting steady home BS readings      Relevant Orders   Comprehensive metabolic panel (Completed)   UA/M w/rflx Culture, Routine (Completed)   HgB A1c (Completed)       Follow up plan: Return in about 3 months (around 01/20/2020) for 6 month f/u.

## 2019-10-21 LAB — COMPREHENSIVE METABOLIC PANEL
ALT: 15 IU/L (ref 0–32)
AST: 11 IU/L (ref 0–40)
Albumin/Globulin Ratio: 1.8 (ref 1.2–2.2)
Albumin: 4.4 g/dL (ref 3.7–4.7)
Alkaline Phosphatase: 98 IU/L (ref 39–117)
BUN/Creatinine Ratio: 17 (ref 12–28)
BUN: 15 mg/dL (ref 8–27)
Bilirubin Total: 0.2 mg/dL (ref 0.0–1.2)
CO2: 22 mmol/L (ref 20–29)
Calcium: 9.6 mg/dL (ref 8.7–10.3)
Chloride: 104 mmol/L (ref 96–106)
Creatinine, Ser: 0.87 mg/dL (ref 0.57–1.00)
GFR calc Af Amer: 77 mL/min/{1.73_m2} (ref >59)
GFR calc non Af Amer: 67 mL/min/{1.73_m2} (ref >59)
Globulin, Total: 2.4 g/dL (ref 1.5–4.5)
Glucose: 137 mg/dL — ABNORMAL HIGH (ref 65–99)
Potassium: 4.2 mmol/L (ref 3.5–5.2)
Sodium: 141 mmol/L (ref 134–144)
Total Protein: 6.8 g/dL (ref 6.0–8.5)

## 2019-10-21 LAB — HEMOGLOBIN A1C
Est. average glucose Bld gHb Est-mCnc: 212 mg/dL
Hgb A1c MFr Bld: 9 % — ABNORMAL HIGH (ref 4.8–5.6)

## 2019-10-27 NOTE — Assessment & Plan Note (Signed)
Recheck A1C, will have CCM Nursing reach out for diet and diabetic education. Order faxed for new testing supplies so she can be following her home glucose levels. Discussed significant importance of this when on insulin therapy in particular. Will work on diet and see how increased jardiance benefits her A1C, recheck in 3 months. Will not adjust insulins until getting steady home BS readings

## 2019-11-17 ENCOUNTER — Ambulatory Visit: Payer: Medicare Other | Admitting: Family Medicine

## 2019-11-18 ENCOUNTER — Ambulatory Visit (INDEPENDENT_AMBULATORY_CARE_PROVIDER_SITE_OTHER): Payer: Medicare Other | Admitting: Pharmacist

## 2019-11-18 DIAGNOSIS — E1142 Type 2 diabetes mellitus with diabetic polyneuropathy: Secondary | ICD-10-CM | POA: Diagnosis not present

## 2019-11-18 NOTE — Patient Instructions (Signed)
Visit Information  Goals Addressed            This Visit's Progress     Patient Stated   . "I want to stay healthy" (pt-stated)       Current Barriers:  . Diabetes: uncontrolled; most recent A1c 9%  o Notes she has Medicare/Medicaid, denies any financial concerns at this time . Current antihyperglycemic regimen: Jardiance 25 mg daily, Soliquia 50-60 units daily, Humalog 25-30 units; Onglyza 5 mg daily - has not needed to switch to Januvia yet d/t the supply she has at home; o Intolerance to metformin - diarrhea and nausea . Denies hypoglycemia recently  . Current meal patterns: o Breakfast: Sausage and egg sandwich; bowl of cereal; depends on her mood;  o Lunch: Sandwich o Supper: whatever her daughter cooks (daughter and 3 grandkids live with her) o Snacks: Infrequent snacker; occasionally plain Lays potato chips; occasional oatmeal cookie (splits w/ granddaughter) o Drinks: Pepsi zero; avoids drinking sweet tea o Desserts: tries to avoid, but notes that ice cream is her weakness  . Current exercise: Notes that she has her "hands full" helping take care of her 3 grandchildren . Current blood glucose readings:  o Fasting: 139-179s o Afternoon: 140s-150s o Post supper: notes it depends on what she eats; 170-180s . Cardiovascular risk reduction: o Current hypertensive regimen: amlodipine 10 mg daily, spironolactone 25 mg QAM, atenolol 100 mg QPM;  o Current hyperlipidemia regimen: lovastatin 40 mg daily, last LDL 75   Pharmacist Clinical Goal(s):  Marland Kitchen Over the next 90 days, patient will work with PharmD and primary care provider to address optimized medication management  Interventions: . Comprehensive medication review performed, medication list updated in electronic medical record . Reviewed recent glucose readings. Could consider dose increase of Soliqua; will defer changes at this time to focus on dietary interventions. Will collaborate w/ CCM RN CM for lifestyle intervention  counseling . Reviewed that Januvia will replace Onglyza when patient completes her current supply.   Patient Self Care Activities:  . Patient will check blood glucose BID, document, and provide at future appointments . Patient will take medications as prescribed . Patient will report any questions or concerns to provider   Please see past updates related to this goal by clicking on the "Past Updates" button in the selected goal         The patient verbalized understanding of instructions provided today and declined a print copy of patient instruction materials.   Plan: - Will collaborate w/ RN CM as above - Will outreach patient for continued medication management support in the next 5-6 weeks  [name], PharmD, [place] [phone]

## 2019-11-18 NOTE — Chronic Care Management (AMB) (Signed)
Chronic Care Management   Follow Up Note   11/18/2019 Name: Tami Cummings MRN: [id number] DOB: 20-Aug-1946  Referred by: Tami Maple, MD Reason for referral : Chronic Care Management (Medication Management)   Tami Cummings is a 73 y.o. year old female who is a primary care patient of Crissman, Jeannette How, MD. The CCM team was consulted for assistance with chronic disease management and care coordination needs.    Contacted patient for medication management review today.   Review of patient status, including review of consultants reports, relevant laboratory and other test results, and collaboration with appropriate care team members and the patient's provider was performed as part of comprehensive patient evaluation and provision of chronic care management services.    SDOH (Social Determinants of Health) screening performed today: None. See Care Plan for related entries.   Outpatient Encounter Medications as of 11/18/2019  Medication Sig Note  . amLODipine (NORVASC) 10 MG tablet Take 1 tablet (10 mg total) by mouth daily.   Marland Kitchen aspirin EC 81 MG tablet Take 81 mg by mouth daily. am   . atenolol (TENORMIN) 100 MG tablet Take 1 tablet (100 mg total) by mouth daily. 10/14/2019: QHS  . Blood Glucose Monitoring Suppl (ONE TOUCH ULTRA 2) w/Device KIT USE ONCE TO TWICE DAILY   . empagliflozin (JARDIANCE) 25 MG TABS tablet Take 25 mg by mouth daily before breakfast.   . Insulin Glargine-Lixisenatide (SOLIQUA) 100-33 UNT-MCG/ML SOPN Inject 60 Units into the skin daily. Start 30 u increase 2 u every 2 days as needed max 60u a day 11/18/2019: 50-60 units QHS  . insulin lispro (HUMALOG KWIKPEN) 100 UNIT/ML KwikPen Inject 0.25 mLs (25 Units total) into the skin 2 (two) times daily before a meal.   . lovastatin (MEVACOR) 40 MG tablet Take 1 tablet (40 mg total) by mouth daily with breakfast. 10/14/2019: QAM  . saxagliptin HCl (ONGLYZA) 5 MG TABS tablet Take 5 mg by mouth daily.   Marland Kitchen  spironolactone (ALDACTONE) 25 MG tablet Take 1 tablet (25 mg total) by mouth daily. 10/14/2019: QAM  . BD PEN NEEDLE MICRO U/F 32G X 6 MM MISC USE 4 TO 5 DAILY WITH INSULIN USE   . glucose blood (ONE TOUCH ULTRA TEST) test strip USE ONE TO TWO STRIPS TO CHECK GLUCOSE ONCE DAILY   . sitaGLIPtin (JANUVIA) 50 MG tablet Take 1 tablet (50 mg total) by mouth daily. (Patient not taking: Reported on 11/18/2019)    No facility-administered encounter medications on file as of 11/18/2019.      Goals Addressed            This Visit's Progress     Patient Stated   . "I want to stay healthy" (pt-stated)       Current Barriers:  . Diabetes: uncontrolled; most recent A1c 9%  o Notes she has Medicare/Medicaid, denies any financial concerns at this time . Current antihyperglycemic regimen: Jardiance 25 mg daily, Soliquia 50-60 units daily, Humalog 25-30 units; Onglyza 5 mg daily - has not needed to switch to Januvia yet d/t the supply she has at home; o Intolerance to metformin - diarrhea and nausea . Denies hypoglycemia recently  . Current meal patterns: o Breakfast: Sausage and egg sandwich; bowl of cereal; depends on her mood;  o Lunch: Sandwich o Supper: whatever her daughter cooks (daughter and 3 grandkids live with her) o Snacks: Infrequent snacker; occasionally plain Lays potato chips; occasional oatmeal cookie (splits w/ granddaughter) o Drinks: Pepsi zero; avoids drinking  sweet tea o Desserts: tries to avoid, but notes that ice cream is her weakness  . Current exercise: Notes that she has her "hands full" helping take care of her 3 grandchildren . Current blood glucose readings:  o Fasting: 139-179s o Afternoon: 140s-150s o Post supper: notes it depends on what she eats; 170-180s . Cardiovascular risk reduction: o Current hypertensive regimen: amlodipine 10 mg daily, spironolactone 25 mg QAM, atenolol 100 mg QPM;  o Current hyperlipidemia regimen: lovastatin 40 mg daily, last LDL 75    Pharmacist Clinical Goal(s):  Marland Kitchen Over the next 90 days, patient will work with PharmD and primary care provider to address optimized medication management  Interventions: . Comprehensive medication review performed, medication list updated in electronic medical record . Reviewed recent glucose readings. Could consider dose increase of Soliqua; will defer changes at this time to focus on dietary interventions. Will collaborate w/ CCM RN CM for lifestyle intervention counseling . Reviewed that Januvia will replace Onglyza when patient completes her current supply.   Patient Self Care Activities:  . Patient will check blood glucose BID, document, and provide at future appointments . Patient will take medications as prescribed . Patient will report any questions or concerns to provider   Please see past updates related to this goal by clicking on the "Past Updates" button in the selected goal          Plan: - Will collaborate w/ RN CM as above - Will outreach patient for continued medication management support in the next 5-6 weeks  Tami Cummings, PharmD, [place] [phone]

## 2019-12-17 ENCOUNTER — Telehealth: Payer: Self-pay

## 2019-12-22 ENCOUNTER — Other Ambulatory Visit: Payer: Self-pay

## 2019-12-22 MED ORDER — BD PEN NEEDLE MICRO U/F 32G X 6 MM MISC: 12 refills | Status: DC

## 2019-12-22 NOTE — Telephone Encounter (Signed)
[facility] faxed a Rx refill request on BD [id number]

## 2019-12-30 ENCOUNTER — Ambulatory Visit (INDEPENDENT_AMBULATORY_CARE_PROVIDER_SITE_OTHER): Payer: Medicare Other | Admitting: Pharmacist

## 2019-12-30 DIAGNOSIS — I10 Essential (primary) hypertension: Secondary | ICD-10-CM | POA: Diagnosis not present

## 2019-12-30 DIAGNOSIS — E1142 Type 2 diabetes mellitus with diabetic polyneuropathy: Secondary | ICD-10-CM

## 2019-12-30 NOTE — Patient Instructions (Signed)
Visit Information  Ms. Speno,   It was great talking to you today!  We talked about the goal A1c being less than 7%. This means that fasting sugars are usually less than 130 and sugars 2 hours after a meal are less than 180. Included is a log for you to write down your blood sugar and blood pressure readings to review at future phone calls.   Here is our contact information:    CCM (Chronic Care Management) Team   Noreene Larsson, RN, MSN, CCM Nurse Care Coordinator  [phone]  [name] PharmD, BCACP Clinical Pharmacist  [phone]    Goals Addressed            This Visit's Progress     Patient Stated   . PharmD "I want to stay healthy" (pt-stated)       Current Barriers:  . Diabetes: uncontrolled; most recent A1c 9%  . Current antihyperglycemic regimen: Jardiance 25 mg daily, Soliquia 50-60 units daily (50 units daily most days, 60 units if she forgets her supper Humalog), Humalog 25 units BID-TID; Onglyza 5 mg daily - has not needed to switch to Januvia yet d/t the supply she has at home; o Intolerance to metformin - diarrhea and nausea . Denies hypoglycemia  . Current exercise: Notes that she has her "hands full" helping take care of her 3 grandchildren . Current blood glucose readings:  o Fasting: 130-160s o 1 hour post supper: 160-180s . Cardiovascular risk reduction: o Current hypertensive regimen: amlodipine 10 mg daily, spironolactone 25 mg QAM, atenolol 100 mg QPM; 150/79, 128/76, 116/60, 143/81, 149/91, 109/68, 124/69, 116/82; [employer]Smart BP machine from [facility], can't remember how long she's had it o Current hyperlipidemia regimen: lovastatin 40 mg daily, last LDL 75   Pharmacist Clinical Goal(s):  Marland Kitchen Over the next 90 days, patient will work with PharmD and primary care provider to address optimized medication management  Interventions: . Comprehensive medication review performed, medication list updated in electronic medical record . Reviewed recent  glucose readings. Reviewed pharmacokinetics of basal vs bolus insulin. Counseled to take Humalog prior to meals. Advised to take 54-56 units of Soliquia (prescription written for 60 units daily) to target improvement in fasting blood sugar readings. Patient verbalized understanding.  . Provided w/ blood sugar chart and blood pressure chart.  . Encouraged to bring blood pressure meter to next in-person office appointment for comparison.  . Reminded to switch to [facility] when she completes Onglyza   Patient Self Care Activities:  . Patient will check blood glucose BID, document, and provide at future appointments . Patient will take medications as prescribed . Patient will report any questions or concerns to provider   Please see past updates related to this goal by clicking on the "Past Updates" button in the selected goal         Print copy of patient instructions provided.   Plan:  - Will collaborate w/ RN CM for outreach in February for diabetic education counseling  - Scheduled follow up call 02/24/20 @ 9 o'clock   [name], PharmD, [place]  [phone]

## 2019-12-30 NOTE — Chronic Care Management (AMB) (Signed)
Chronic Care Management   Follow Up Note   12/30/2019 Name: Tami Cummings MRN: [id number] DOB: Nov 24, 1946  Referred by: [name], MD Reason for referral : Chronic Care Management (Medication Management )   Tami Cummings is a 74 y.o. year old female who is a primary care patient of Crissman, Jeannette How, MD. The CCM team was consulted for assistance with chronic disease management and care coordination needs.    Contacted patient for medication management review.   Review of patient status, including review of consultants reports, relevant laboratory and other test results, and collaboration with appropriate care team members and the patient's provider was performed as part of comprehensive patient evaluation and provision of chronic care management services.    SDOH (Social Determinants of Health) screening performed today: None. See Care Plan for related entries.   Outpatient Encounter Medications as of 12/30/2019  Medication Sig Note  . amLODipine (NORVASC) 10 MG tablet Take 1 tablet (10 mg total) by mouth daily.   Marland Kitchen aspirin EC 81 MG tablet Take 81 mg by mouth daily. am   . atenolol (TENORMIN) 100 MG tablet Take 1 tablet (100 mg total) by mouth daily. 10/14/2019: QHS  . Blood Glucose Monitoring Suppl (ONE TOUCH ULTRA 2) w/Device KIT USE ONCE TO TWICE DAILY   . empagliflozin (JARDIANCE) 25 MG TABS tablet Take 25 mg by mouth daily before breakfast.   . glucose blood (ONE TOUCH ULTRA TEST) test strip USE ONE TO TWO STRIPS TO CHECK GLUCOSE ONCE DAILY   . Insulin Glargine-Lixisenatide (SOLIQUA) 100-33 UNT-MCG/ML SOPN Inject 60 Units into the skin daily. Start 30 u increase 2 u every 2 days as needed max 60u a day 11/18/2019: 50-60 units QHS  . insulin lispro (HUMALOG KWIKPEN) 100 UNIT/ML KwikPen Inject 0.25 mLs (25 Units total) into the skin 2 (two) times daily before a meal.   . Insulin Pen Needle (BD PEN NEEDLE MICRO U/F) 32G X 6 MM MISC USE 4 TO 5 DAILY WITH INSULIN USE     . lovastatin (MEVACOR) 40 MG tablet Take 1 tablet (40 mg total) by mouth daily with breakfast. 10/14/2019: QAM  . saxagliptin HCl (ONGLYZA) 5 MG TABS tablet Take 5 mg by mouth daily.   Marland Kitchen spironolactone (ALDACTONE) 25 MG tablet Take 1 tablet (25 mg total) by mouth daily. 10/14/2019: QAM  . sitaGLIPtin (JANUVIA) 50 MG tablet Take 1 tablet (50 mg total) by mouth daily. (Patient not taking: Reported on 11/18/2019)    No facility-administered encounter medications on file as of 12/30/2019.     Goals Addressed            This Visit's Progress     Patient Stated   . PharmD "I want to stay healthy" (pt-stated)       Current Barriers:  . Diabetes: uncontrolled; most recent A1c 9%  . Current antihyperglycemic regimen: Jardiance 25 mg daily, Soliquia 50-60 units daily (50 units daily most days, 60 units if she forgets her supper Humalog), Humalog 25 units BID-TID; Onglyza 5 mg daily - has not needed to switch to Januvia yet d/t the supply she has at home; o Intolerance to metformin - diarrhea and nausea . Denies hypoglycemia  . Current exercise: Notes that she has her "hands full" helping take care of her 3 grandchildren . Current blood glucose readings:  o Fasting: 130-160s o 1 hour post supper: 160-180s . Cardiovascular risk reduction: o Current hypertensive regimen: amlodipine 10 mg daily, spironolactone 25 mg QAM, atenolol 100  mg QPM; 150/79, 128/76, 116/60, 143/81, 149/91, 109/68, 124/69, 116/82; HealthSmart BP machine from [facility], can't remember how long she's had it o Current hyperlipidemia regimen: lovastatin 40 mg daily, last LDL 75   Pharmacist Clinical Goal(s):  Marland Kitchen Over the next 90 days, patient will work with PharmD and primary care provider to address optimized medication management  Interventions: . Comprehensive medication review performed, medication list updated in electronic medical record . Reviewed recent glucose readings. Reviewed pharmacokinetics of basal vs bolus insulin.  Counseled to take Humalog prior to meals. Advised to take 54-56 units of Soliquia (prescription written for 60 units daily) to target improvement in fasting blood sugar readings. Patient verbalized understanding.  . Provided w/ blood sugar chart and blood pressure chart.  . Encouraged to bring blood pressure meter to next in-person office appointment for comparison.  . Reminded to switch to [facility] when she completes Onglyza   Patient Self Care Activities:  . Patient will check blood glucose BID, document, and provide at future appointments . Patient will take medications as prescribed . Patient will report any questions or concerns to provider   Please see past updates related to this goal by clicking on the "Past Updates" button in the selected goal          Plan: - Will collaborate w/ RN CM for outreach in February for diabetic education counseling - Scheduled follow up call 02/24/20 @ 9 o'clock  [name], PharmD, [place] [phone]

## 2020-01-20 ENCOUNTER — Ambulatory Visit: Payer: Medicare Other | Admitting: Family Medicine

## 2020-01-23 ENCOUNTER — Ambulatory Visit (INDEPENDENT_AMBULATORY_CARE_PROVIDER_SITE_OTHER): Payer: Medicare Other | Admitting: General Practice

## 2020-01-23 ENCOUNTER — Telehealth: Payer: Self-pay | Admitting: General Practice

## 2020-01-23 DIAGNOSIS — I10 Essential (primary) hypertension: Secondary | ICD-10-CM | POA: Diagnosis not present

## 2020-01-23 DIAGNOSIS — E782 Mixed hyperlipidemia: Secondary | ICD-10-CM | POA: Diagnosis not present

## 2020-01-23 DIAGNOSIS — E1142 Type 2 diabetes mellitus with diabetic polyneuropathy: Secondary | ICD-10-CM

## 2020-01-23 NOTE — Patient Instructions (Signed)
Visit Information  Goals Addressed            This Visit's Progress   . PharmD "I want to stay healthy" (pt-stated)   On track    Current Barriers:  . Diabetes: uncontrolled; most recent A1c 9%  . Current antihyperglycemic regimen: Jardiance 25 mg daily, Soliquia 50-60 units daily (50 units daily most days, 60 units if she forgets her supper Humalog), Humalog 25 units BID-TID; Onglyza 5 mg daily - has not needed to switch to Januvia yet d/t the supply she has at home; o Intolerance to metformin - diarrhea and nausea . Denies hypoglycemia  . Current exercise: Notes that she has her "hands full" helping take care of her 3 grandchildren . Current blood glucose readings:  o Fasting: 130-160s o 1 hour post supper: 160-180s . Cardiovascular risk reduction: o Current hypertensive regimen: amlodipine 10 mg daily, spironolactone 25 mg QAM, atenolol 100 mg QPM; 150/79, 128/76, 116/60, 143/81, 149/91, 109/68, 124/69, 116/82; HealthSmart BP machine from [facility], can't remember how long she's had it o Current hyperlipidemia regimen: lovastatin 40 mg daily, last LDL 75   Pharmacist Clinical Goal(s):  Marland Kitchen Over the next 90 days, patient will work with PharmD and primary care provider to address optimized medication management  Interventions: . Comprehensive medication review performed, medication list updated in electronic medical record . Reviewed recent glucose readings. Reviewed pharmacokinetics of basal vs bolus insulin. Counseled to take Humalog prior to meals. Advised to take 54-56 units of Soliquia (prescription written for 60 units daily) to target improvement in fasting blood sugar readings. Patient verbalized understanding.  . Provided w/ blood sugar chart and blood pressure chart.  . Encouraged to bring blood pressure meter to next in-person office appointment for comparison.  . Reminded to switch to [facility] when she completes Onglyza   Patient Self Care Activities:  . Patient will check blood  glucose BID, document, and provide at future appointments . Patient will take medications as prescribed . Patient will report any questions or concerns to provider   Please see past updates related to this goal by clicking on the "Past Updates" button in the selected goal      . RNCM: I am scared to take that vaccine       Current Barriers:  Marland Kitchen Knowledge Deficits related to COVID-19 and impact on patient self health management  Clinical Goal(s): ID . Over the next 30 days, patient will verbalize basic understanding of COVID-19 impact on individual health and self health management as evidenced by verbalization of basic understanding of COVID-19 as a viral disease, measures to prevent exposure, signs and symptoms, when to contact provider  Interventions: . Provided education to patient re: benefits of the COVID19 vaccine in patients with chronic health conditions . Collaborated with PCP and pharmacist regarding education and assistance with helping the patient understand the negative impact COVID19 can have on patients with chronic medical conditions . Discussed plans with patient for ongoing care management follow up and provided patient with direct contact information for care management team . Pharmacy referral for assistance with education and support related to [place] vaccine  Patient Self Care Activities:  . Patient verbalizes understanding of plan to talk with pcp and pharmacist concerning the COVID 19 vaccine and her reservations in getting the vaccination . Attends all scheduled provider appointments . Calls provider office for new concerns or questions  Initial goal documentation  For information about COVID-19 or "Corona Virus", the following web resources may be helpful:  CDC: BeginnerSteps.be    [place]:  [email or web address]                     . RNCM: I don't take my blood pressure every day and I take my sugars when I think about it       Current Barriers:  . Chronic Disease Management support, education, and care coordination needs related to HTN, HLD, and DMII  Clinical Goal(s) related to HTN, HLD, and DMII:  Over the next 60 days, patient will:  . Work with the care management team to address educational, disease management, and care coordination needs  . Begin or continue self health monitoring activities as directed today Measure and record cbg (blood glucose) BID times daily and Measure and record blood pressure 4 times per week, work toward keeping a log of readings, and work toward increasing activity level . Call provider office for new or worsened signs and symptoms Blood glucose findings outside established parameters, Blood pressure findings outside established parameters, and New or worsened symptom related to HTN, Dm2, and HLD . Call care management team with questions or concerns . Verbalize basic understanding of patient centered plan of care established today  Interventions related to HTN, HLD, and DMII:  . Evaluation of current treatment plans and patient's adherence to plan as established by provider . Assessed patient understanding of disease states . Assessed patient's education and care coordination needs . Provided disease specific education to patient  . Collaborated with appropriate clinical care team members regarding patient needs . Evaluation of the patients compliance of writing down blood sugars and blood pressures on the log provided by the pharmacist in January 2021.  The patient states she has the log but she does not use it. Encouraged the patient to start using the log daily. Patient states that she has 2 grandchildren that keep her busy. . Evaluation of the patients dietary habits. The patients daughter cooks meals for the patient. The patient states "I don't use  much salt".  The member does eat fried foods and processed foods at times, education on Heart Healthy/ADA diet . Discussed working toward a structured exercise plan.  The patient states she has a "bad knee" and doesn't get out much. . Evaluation of medication compliance with Januvia.  The patient has not picked up the [place] at the pharmacy but verbalized she is going to get her daughter to take her today. She also verbalized that she still has a "couple" of Onglaza left to take. . Evaluation of BP readings and blood sugar readings: Last blood pressure was 179/80 (has not taken today), Fasting blood sugars around "169", the patient had  not taken readings this am. Encouraged compliance with taking blood pressures and blood sugars consistently  Patient Self Care Activities related to HTN, HLD, and DMII:  . Patient is unable to independently self-manage chronic health conditions  Initial goal documentation        The patient verbalized understanding of instructions provided today and declined a print copy of patient instruction materials.   Telephone follow up appointment with care management team member scheduled for:03-23-2020 at 0900 am  [place], MSN, [facility] Mobile: [phone]

## 2020-01-23 NOTE — Chronic Care Management (AMB) (Signed)
Chronic Care Management   Follow Up Note   01/23/2020 Name: Christee Mervine MRN: [id number] DOB: [birth year]  Referred by: [name], MD Reason for referral : Chronic Care Management (Follow up: DM2/HTN/HLD/Compliance)   [name] is a 74 y.o. year old female who is a primary care patient of Crissman, Jeannette How, MD. The CCM team was consulted for assistance with chronic disease management and care coordination needs.    Review of patient status, including review of consultants reports, relevant laboratory and other test results, and collaboration with appropriate care team members and the patient's provider was performed as part of comprehensive patient evaluation and provision of chronic care management services.    SDOH (Social Determinants of Health) screening performed today: Biomedical engineer  Food Insecurity  Depression   Alcohol/Substance Use Tobacco Use Stress Physical Activity. See Care Plan for related entries.   Outpatient Encounter Medications as of 01/23/2020  Medication Sig Note   amLODipine (NORVASC) 10 MG tablet Take 1 tablet (10 mg total) by mouth daily.    aspirin EC 81 MG tablet Take 81 mg by mouth daily. am    atenolol (TENORMIN) 100 MG tablet Take 1 tablet (100 mg total) by mouth daily. 10/14/2019: QHS   Blood Glucose Monitoring Suppl (ONE TOUCH ULTRA 2) w/Device KIT USE ONCE TO TWICE DAILY    empagliflozin (JARDIANCE) 25 MG TABS tablet Take 25 mg by mouth daily before breakfast.    glucose blood (ONE TOUCH ULTRA TEST) test strip USE ONE TO TWO STRIPS TO CHECK GLUCOSE ONCE DAILY    Insulin Glargine-Lixisenatide (SOLIQUA) 100-33 UNT-MCG/ML SOPN Inject 60 Units into the skin daily. Start 30 u increase 2 u every 2 days as needed max 60u a day 11/18/2019: 50-60 units QHS   insulin lispro (HUMALOG KWIKPEN) 100 UNIT/ML KwikPen Inject 0.25 mLs (25 Units total) into the skin 2 (two) times daily before a meal.    Insulin Pen Needle  (BD PEN NEEDLE MICRO U/F) 32G X 6 MM MISC USE 4 TO 5 DAILY WITH INSULIN USE    lovastatin (MEVACOR) 40 MG tablet Take 1 tablet (40 mg total) by mouth daily with breakfast. 10/14/2019: QAM   saxagliptin HCl (ONGLYZA) 5 MG TABS tablet Take 5 mg by mouth daily.    sitaGLIPtin (JANUVIA) 50 MG tablet Take 1 tablet (50 mg total) by mouth daily. (Patient not taking: Reported on 11/18/2019)    spironolactone (ALDACTONE) 25 MG tablet Take 1 tablet (25 mg total) by mouth daily. 10/14/2019: QAM   No facility-administered encounter medications on file as of 01/23/2020.     Objective:   BP Readings from Last 3 Encounters:  01/22/20 (!) 179/80  10/20/19 132/61  07/18/19 111/68   Lab Results  Component Value Date   HGBA1C 9.0 (H) 10/20/2019   Lab Results  Component Value Date   CHOL 142 07/18/2019   CHOL 137 12/04/2018   CHOL 127 05/23/2018   Lab Results  Component Value Date   HDL 36 (L) 07/18/2019   HDL 35 (L) 05/23/2018   HDL 39 (L) 04/10/2017   Lab Results  Component Value Date   LDLCALC 75 07/18/2019   LDLCALC 70 05/23/2018   LDLCALC 87 04/10/2017   Lab Results  Component Value Date   TRIG 153 (H) 07/18/2019   TRIG 262 (H) 12/04/2018   TRIG 108 05/23/2018   Lab Results  Component Value Date   CHOLHDL 4.0 04/10/2017   CHOLHDL 4.1 03/14/2016   No  results found for: LDLDIRECT  Goals Addressed            This Visit's Progress    PharmD "I want to stay healthy" (pt-stated)   On track    Current Barriers:   Diabetes: uncontrolled; most recent A1c 9%   Current antihyperglycemic regimen: Jardiance 25 mg daily, Soliquia 50-60 units daily (50 units daily most days, 60 units if she forgets her supper Humalog), Humalog 25 units BID-TID; Onglyza 5 mg daily - has not needed to switch to Januvia yet d/t the supply she has at home; o Intolerance to metformin - diarrhea and nausea  Denies hypoglycemia   Current exercise: Notes that she has her "hands full" helping take care  of her 3 grandchildren  Current blood glucose readings:  o Fasting: 130-160s o 1 hour post supper: 160-180s  Cardiovascular risk reduction: o Current hypertensive regimen: amlodipine 10 mg daily, spironolactone 25 mg QAM, atenolol 100 mg QPM; 150/79, 128/76, 116/60, 143/81, 149/91, 109/68, 124/69, 116/82; HealthSmart BP machine from [facility], can't remember how long she's had it o Current hyperlipidemia regimen: lovastatin 40 mg daily, last LDL 75   Pharmacist Clinical Goal(s):   Over the next 90 days, patient will work with PharmD and primary care provider to address optimized medication management  Interventions:  Comprehensive medication review performed, medication list updated in electronic medical record  Reviewed recent glucose readings. Reviewed pharmacokinetics of basal vs bolus insulin. Counseled to take Humalog prior to meals. Advised to take 54-56 units of Soliquia (prescription written for 60 units daily) to target improvement in fasting blood sugar readings. Patient verbalized understanding.   Provided w/ blood sugar chart and blood pressure chart.   Encouraged to bring blood pressure meter to next in-person office appointment for comparison.   Reminded to switch to [facility] when she completes Onglyza   Patient Self Care Activities:   Patient will check blood glucose BID, document, and provide at future appointments  Patient will take medications as prescribed  Patient will report any questions or concerns to provider   Please see past updates related to this goal by clicking on the "Past Updates" button in the selected goal       RNCM: I am scared to take that vaccine       Current Barriers:   Knowledge Deficits related to COVID-19 and impact on patient self health management  Clinical Goal(s): ID  Over the next 30 days, patient will verbalize basic understanding of COVID-19 impact on individual health and self health management as evidenced by verbalization of  basic understanding of COVID-19 as a viral disease, measures to prevent exposure, signs and symptoms, when to contact provider  Interventions:  Provided education to patient re: benefits of the COVID19 vaccine in patients with chronic health conditions  Collaborated with PCP and pharmacist regarding education and assistance with helping the patient understand the negative impact COVID19 can have on patients with chronic medical conditions  Discussed plans with patient for ongoing care management follow up and provided patient with direct contact information for care management team  Pharmacy referral for assistance with education and support related to [place] vaccine  Patient Self Care Activities:   Patient verbalizes understanding of plan to talk with pcp and pharmacist concerning the COVID 19 vaccine and her reservations in getting the vaccination  Attends all scheduled provider appointments  Calls provider office for new concerns or questions  Initial goal documentation  For information about COVID-19 or "Corona Virus", the following web resources  may be helpful:  CDC: BeginnerSteps.be    [place]:  [email or web address]                     RNCM: I don't take my blood pressure every day and I take my sugars when I think about it       Current Barriers:   Chronic Disease Management support, education, and care coordination needs related to HTN, HLD, and DMII  Clinical Goal(s) related to HTN, HLD, and DMII:  Over the next 60 days, patient will:   Work with the care management team to address educational, disease management, and care coordination needs   Begin or continue self health monitoring activities as directed today Measure and record cbg (blood glucose) BID times daily and Measure and record blood pressure 4 times per week,  work toward keeping a log of readings, and work toward increasing activity level  Call provider office for new or worsened signs and symptoms Blood glucose findings outside established parameters, Blood pressure findings outside established parameters, and New or worsened symptom related to HTN, Dm2, and HLD  Call care management team with questions or concerns  Verbalize basic understanding of patient centered plan of care established today  Interventions related to HTN, HLD, and DMII:   Evaluation of current treatment plans and patient's adherence to plan as established by provider  Assessed patient understanding of disease states  Assessed patient's education and care coordination needs  Provided disease specific education to patient   Collaborated with appropriate clinical care team members regarding patient needs  Evaluation of the patients compliance of writing down blood sugars and blood pressures on the log provided by the pharmacist in January 2021.  The patient states she has the log but she does not use it. Encouraged the patient to start using the log daily. Patient states that she has 2 grandchildren that keep her busy.  Evaluation of the patients dietary habits. The patients daughter cooks meals for the patient. The patient states "I don't use much salt".  The member does eat fried foods and processed foods at times, education on Heart Healthy/ADA diet  Discussed working toward a structured exercise plan.  The patient states she has a "bad knee" and doesn't get out much.  Evaluation of medication compliance with Januvia.  The patient has not picked up the [place] at the pharmacy but verbalized she is going to get her daughter to take her today. She also verbalized that she still has a "couple" of Onglaza left to take.  Evaluation of BP readings and blood sugar readings: Last blood pressure was 179/80 (has not taken today), Fasting blood sugars around "169", the patient had   not taken readings this am. Encouraged compliance with taking blood pressures and blood sugars consistently  Patient Self Care Activities related to HTN, HLD, and DMII:   Patient is unable to independently self-manage chronic health conditions  Initial goal documentation         Plan:   Telephone follow up appointment with care management team member scheduled for:03-23-2020 at 0900 am   [place], MSN, [facility] Mobile: [phone]

## 2020-01-29 ENCOUNTER — Other Ambulatory Visit: Payer: Self-pay

## 2020-01-29 ENCOUNTER — Encounter: Payer: Self-pay | Admitting: Family Medicine

## 2020-01-29 ENCOUNTER — Ambulatory Visit (INDEPENDENT_AMBULATORY_CARE_PROVIDER_SITE_OTHER): Payer: Medicare Other | Admitting: Family Medicine

## 2020-01-29 VITALS — BP 145/73 | HR 83 | Temp 97.9°F | Ht 60.5 in | Wt 204.0 lb

## 2020-01-29 DIAGNOSIS — E1142 Type 2 diabetes mellitus with diabetic polyneuropathy: Secondary | ICD-10-CM

## 2020-01-29 MED ORDER — SITAGLIPTIN PHOSPHATE 50 MG PO TABS: 50.0000 mg | ORAL_TABLET | Freq: Every day | ORAL | 1 refills | Status: DC

## 2020-01-29 MED ORDER — SAXAGLIPTIN HCL 5 MG PO TABS: 5.0000 mg | ORAL_TABLET | Freq: Every day | ORAL | 1 refills | Status: DC

## 2020-01-29 NOTE — Assessment & Plan Note (Signed)
Discussed at length about diet modifications, including substitutions for foods she loves daily and alternative recipe ideas for better control. Also encouraged exercise even if just at home ideas. Will increase soliqua to 54-56 units at bedtime based on fasting BS readings and continue remainder of regimen as is

## 2020-01-29 NOTE — Progress Notes (Signed)
BP (!) 145/73   Pulse 83   Temp 97.9 F (36.6 C) (Oral)   Ht 5' 0.5" (1.537 m)   Wt 204 lb (92.5 kg)   SpO2 96%   BMI 39.19 kg/m    Subjective:    [name], female    DOB: 24-Feb-1946, 74 y.o.   MRN: [id number]  HPI: Tami Cummings is a 74 y.o. female  Chief Complaint  Patient presents with  . Diabetes   Patient presenting today for DM f/u. Recently got a glucometer for home use. Fasting BSs have been running 140s - 170s. Does not check throughout the day. Taking 50 units soliqua at bedtime and taking 25 units of humalog BID, sometimes 30 units depending on what she's eating. Transitioning from onglyza to [place] now due to insurance coverage. Still taking jardiance. Denies low blood sugar spells, side effects on current regimen. Not exercising. Eating cereal for breakfast, sandwhiches, and eats meals with family for dinner. Also eats snacks such as crackers and oatmeal cookies throughout day.   Relevant past medical, surgical, family and social history reviewed and updated as indicated. Interim medical history since our last visit reviewed. Allergies and medications reviewed and updated.  Review of Systems  Per HPI unless specifically indicated above     Objective:    BP (!) 145/73   Pulse 83   Temp 97.9 F (36.6 C) (Oral)   Ht 5' 0.5" (1.537 m)   Wt 204 lb (92.5 kg)   SpO2 96%   BMI 39.19 kg/m   Wt Readings from Last 3 Encounters:  01/29/20 204 lb (92.5 kg)  10/20/19 197 lb (89.4 kg)  07/18/19 199 lb 9.6 oz (90.5 kg)    Physical Exam Vitals and nursing note reviewed.  Constitutional:      Appearance: Normal appearance. She is not ill-appearing.  HENT:     Head: Atraumatic.  Eyes:     Extraocular Movements: Extraocular movements intact.     Conjunctiva/sclera: Conjunctivae normal.  Cardiovascular:     Rate and Rhythm: Normal rate and regular rhythm.     Heart sounds: Normal heart sounds.  Pulmonary:     Effort: Pulmonary  effort is normal.     Breath sounds: Normal breath sounds.  Musculoskeletal:        General: Normal range of motion.     Cervical back: Normal range of motion and neck supple.  Skin:    General: Skin is warm and dry.  Neurological:     Mental Status: She is alert and oriented to person, place, and time.  Psychiatric:        Mood and Affect: Mood normal.        Thought Content: Thought content normal.        Judgment: Judgment normal.     Results for orders placed or performed in visit on 10/20/19  Comprehensive metabolic panel  Result Value Ref Range   Glucose 137 (H) 65 - 99 mg/dL   BUN 15 8 - 27 mg/dL   Creatinine, Ser 0.87 0.57 - 1.00 mg/dL   GFR calc non Af Amer 67 >59 mL/min/1.73   GFR calc Af Amer 77 >59 mL/min/1.73   BUN/Creatinine Ratio 17 12 - 28   Sodium 141 134 - 144 mmol/L   Potassium 4.2 3.5 - 5.2 mmol/L   Chloride 104 96 - 106 mmol/L   CO2 22 20 - 29 mmol/L   Calcium 9.6 8.7 - 10.3 mg/dL   Total  Protein 6.8 6.0 - 8.5 g/dL   Albumin 4.4 3.7 - 4.7 g/dL   Globulin, Total 2.4 1.5 - 4.5 g/dL   Albumin/Globulin Ratio 1.8 1.2 - 2.2   Bilirubin Total 0.2 0.0 - 1.2 mg/dL   Alkaline Phosphatase 98 39 - 117 IU/L   AST 11 0 - 40 IU/L   ALT 15 0 - 32 IU/L  UA/M w/rflx Culture, Routine   Specimen: Urine   URINE  Result Value Ref Range   Specific Gravity, UA <1.005 (L) 1.005 - 1.030   pH, UA 5.0 5.0 - 7.5   Color, UA Yellow Yellow   Appearance Ur Clear Clear   Leukocytes,UA Negative Negative   Protein,UA Negative Negative/Trace   Glucose, UA 3+ (A) Negative   Ketones, UA Negative Negative   RBC, UA Negative Negative   Bilirubin, UA Negative Negative   Urobilinogen, Ur 0.2 0.2 - 1.0 mg/dL   Nitrite, UA Negative Negative  HgB A1c  Result Value Ref Range   Hgb A1c MFr Bld 9.0 (H) 4.8 - 5.6 %   Est. average glucose Bld gHb Est-mCnc 212 mg/dL      Assessment & Plan:   Problem List Items Addressed This Visit      Endocrine   DM type 2 with diabetic peripheral  neuropathy ([place]) - Primary    Discussed at length about diet modifications, including substitutions for foods she loves daily and alternative recipe ideas for better control. Also encouraged exercise even if just at home ideas. Will increase soliqua to 54-56 units at bedtime based on fasting BS readings and continue remainder of regimen as is      Relevant Medications   saxagliptin HCl (ONGLYZA) 5 MG TABS tablet   sitaGLIPtin (JANUVIA) 50 MG tablet   Other Relevant Orders   HgB A1c      25 minutes spent today in direct patient care and counseling with much of that being diet and exercise counseling  Follow up plan: Return in about 3 months (around 04/27/2020) for 6 month f/u.

## 2020-01-30 LAB — HEMOGLOBIN A1C
Est. average glucose Bld gHb Est-mCnc: 197 mg/dL
Hgb A1c MFr Bld: 8.5 % — ABNORMAL HIGH (ref 4.8–5.6)

## 2020-02-24 ENCOUNTER — Ambulatory Visit: Payer: Self-pay | Admitting: Pharmacist

## 2020-02-24 ENCOUNTER — Telehealth: Payer: Self-pay

## 2020-02-24 NOTE — Chronic Care Management (AMB) (Signed)
  Chronic Care Management   Note  02/24/2020 Name: Tami Cummings MRN: [id number] DOB: [birth year]  Tami Cummings is a 74 y.o. year old female who is a primary care patient of Crissman, Jeannette How, MD. The CCM team was consulted for assistance with chronic disease management and care coordination needs.   Attempted to contact patient for medication management review. Left HIPAA compliant message for patient to return my call at their convenience.   Plan: - Will collaborate with Care Guide to outreach to schedule follow up with me  [name], PharmD, [place] [phone]

## 2020-02-25 ENCOUNTER — Telehealth: Payer: Self-pay | Admitting: Family Medicine

## 2020-02-25 NOTE — Chronic Care Management (AMB) (Signed)
  Care Management   Note  02/25/2020 Name: Analah Carls MRN: [id number] DOB: 12-08-1946  Loreli Slot Werth is a 74 y.o. year old female who is a primary care patient of Crissman, Jeannette How, MD and is actively engaged with the care management team. I reached out to [name] by phone today to assist with re-scheduling a follow up visit with the Pharmacist  Follow up plan: Telephone appointment with care management team member scheduled for:05/25/2020  Noreene Larsson, [place], [place], [place] [address] Direct Dial: [phone] [email or web address] Website: [email or web address]

## 2020-03-22 ENCOUNTER — Other Ambulatory Visit: Payer: Self-pay | Admitting: Family Medicine

## 2020-03-22 NOTE — Telephone Encounter (Signed)
Requested Prescriptions  Pending Prescriptions Disp Refills  . JARDIANCE 25 MG TABS tablet [Pharmacy Med Name: Jardiance 25 MG Oral Tablet] 30 tablet 0    Sig: TAKE 1 TABLET BY MOUTH ONCE DAILY BEFORE  BREAKFAST     Endocrinology:  Diabetes - SGLT2 Inhibitors Failed - 03/22/2020  9:39 AM      Failed - HBA1C is between 0 and 7.9 and within 180 days    Hemoglobin A1C  Date Value Ref Range Status  07/13/2016 7.9  Final   HB A1C (BAYER DCA - WAIVED)  Date Value Ref Range Status  12/04/2018 7.8 (H) <7.0 % Final    Comment:                                          Diabetic Adult            <7.0                                       Healthy Adult        4.3 - 5.7                                                           (DCCT/NGSP) American Diabetes Association's Summary of Glycemic Recommendations for Adults with Diabetes: Hemoglobin A1c <7.0%. More stringent glycemic goals (A1c <6.0%) may further reduce complications at the cost of increased risk of hypoglycemia.    Hgb A1c MFr Bld  Date Value Ref Range Status  01/29/2020 8.5 (H) 4.8 - 5.6 % Final    Comment:             Prediabetes: 5.7 - 6.4          Diabetes: >6.4          Glycemic control for adults with diabetes: <7.0          Passed - Cr in normal range and within 360 days    Creatinine, Ser  Date Value Ref Range Status  10/20/2019 0.87 0.57 - 1.00 mg/dL Final         Passed - LDL in normal range and within 360 days    LDL Calculated  Date Value Ref Range Status  07/18/2019 75 0 - 99 mg/dL Final         Passed - eGFR in normal range and within 360 days    GFR calc Af Amer  Date Value Ref Range Status  10/20/2019 77 >59 mL/min/1.73 Final   GFR calc non Af Amer  Date Value Ref Range Status  10/20/2019 67 >59 mL/min/1.73 Final         Passed - Valid encounter within last 6 months    Recent Outpatient Visits          1 month ago DM type 2 with diabetic peripheral neuropathy [facility])   [facility]  [place], [place]   5 months ago DM type 2 with diabetic peripheral neuropathy [facility])   [place], [place], [place]   8 months ago Injury of toenail of right foot, initial encounter   [facility] [place], [place]   9 months ago Essential hypertension, benign   [facility] Crissman, Jeannette How, [place]   1 year ago DM type 2 with diabetic peripheral neuropathy [facility])   [place], [place]      Future Appointments            In 1 month Orene Desanctis, Lilia Argue, [place], [place]   In 2 months  [employer], [place]

## 2020-03-23 ENCOUNTER — Telehealth: Payer: Self-pay | Admitting: General Practice

## 2020-03-23 ENCOUNTER — Ambulatory Visit (INDEPENDENT_AMBULATORY_CARE_PROVIDER_SITE_OTHER): Payer: Medicare Other | Admitting: General Practice

## 2020-03-23 DIAGNOSIS — E782 Mixed hyperlipidemia: Secondary | ICD-10-CM | POA: Diagnosis not present

## 2020-03-23 DIAGNOSIS — I10 Essential (primary) hypertension: Secondary | ICD-10-CM

## 2020-03-23 DIAGNOSIS — E1142 Type 2 diabetes mellitus with diabetic polyneuropathy: Secondary | ICD-10-CM | POA: Diagnosis not present

## 2020-03-23 NOTE — Chronic Care Management (AMB) (Signed)
Chronic Care Management   Follow Up Note   03/23/2020 Name: Tami Cummings MRN: [id number] DOB: [birth year]  Referred by: [name], MD Reason for referral : Chronic Care Management (Follow up on HTN/DM )   Tami Cummings is a 74 y.o. year old female who is a primary care patient of Crissman, Jeannette How, MD. The CCM team was consulted for assistance with chronic disease management and care coordination needs.    Review of patient status, including review of consultants reports, relevant laboratory and other test results, and collaboration with appropriate care team members and the patient's provider was performed as part of comprehensive patient evaluation and provision of chronic care management services.    SDOH (Social Determinants of Health) assessments performed: No See Care Plan activities for detailed interventions related to [facility])     Outpatient Encounter Medications as of 03/23/2020  Medication Sig Note   amLODipine (NORVASC) 10 MG tablet Take 1 tablet (10 mg total) by mouth daily.    aspirin EC 81 MG tablet Take 81 mg by mouth daily. am    atenolol (TENORMIN) 100 MG tablet Take 1 tablet (100 mg total) by mouth daily. 10/14/2019: QHS   Blood Glucose Monitoring Suppl (ONE TOUCH ULTRA 2) w/Device KIT USE ONCE TO TWICE DAILY    glucose blood (ONE TOUCH ULTRA TEST) test strip USE ONE TO TWO STRIPS TO CHECK GLUCOSE ONCE DAILY    Insulin Glargine-Lixisenatide (SOLIQUA) 100-33 UNT-MCG/ML SOPN Inject 60 Units into the skin daily. Start 30 u increase 2 u every 2 days as needed max 60u a day 11/18/2019: 50-60 units QHS   insulin lispro (HUMALOG KWIKPEN) 100 UNIT/ML KwikPen Inject 0.25 mLs (25 Units total) into the skin 2 (two) times daily before a meal.    Insulin Pen Needle (BD PEN NEEDLE MICRO U/F) 32G X 6 MM MISC USE 4 TO 5 DAILY WITH INSULIN USE    JARDIANCE 25 MG TABS tablet TAKE 1 TABLET BY MOUTH ONCE DAILY BEFORE  BREAKFAST    lovastatin (MEVACOR) 40 MG tablet  Take 1 tablet (40 mg total) by mouth daily with breakfast. 10/14/2019: QAM   saxagliptin HCl (ONGLYZA) 5 MG TABS tablet Take 1 tablet (5 mg total) by mouth daily.    sitaGLIPtin (JANUVIA) 50 MG tablet Take 1 tablet (50 mg total) by mouth daily.    spironolactone (ALDACTONE) 25 MG tablet Take 1 tablet (25 mg total) by mouth daily. 10/14/2019: QAM   No facility-administered encounter medications on file as of 03/23/2020.     Objective:  BP Readings from Last 3 Encounters:  03/21/20 128/66  01/29/20 (!) 145/73  01/22/20 (!) 179/80    Goals Addressed            This Visit's Progress    RNCM: I am scared to take that vaccine       Current Barriers:   Knowledge Deficits related to COVID-19 and impact on patient self health management  Clinical Goal(s): ID  Over the next 30 days, patient will verbalize basic understanding of COVID-19 impact on individual health and self health management as evidenced by verbalization of basic understanding of COVID-19 as a viral disease, measures to prevent exposure, signs and symptoms, when to contact provider  Interventions:  Provided education to patient re: benefits of the COVID19 vaccine in patients with chronic health conditions  Collaborated with PCP and pharmacist regarding education and assistance with helping the patient understand the negative impact COVID19 can have on patients with chronic  medical conditions- patient verbalized she is going to take the COVID19 vaccine today- this will be the first vaccine.  Discussed plans with patient for ongoing care management follow up and provided patient with direct contact information for care management team  Pharmacy referral for assistance with education and support related to [place] vaccine- completed- patient is going today to get the first shot for [place]  Patient Self Care Activities:   Patient verbalizes understanding of plan to talk with pcp and pharmacist concerning the COVID 19  vaccine and her reservations in getting the vaccination  Attends all scheduled provider appointments  Calls provider office for new concerns or questions  Please see past updates related to this goal by clicking on the "Past Updates" button in the selected goal   For information about COVID-19 or "Corona Virus", the following web resources may be helpful:  CDC: BeginnerSteps.be    [place]:  [email or web address]                     RNCM: I don't take my blood pressure every day and I take my sugars when I think about it       Current Barriers:   Chronic Disease Management support, education, and care coordination needs related to HTN, HLD, and DMII  Clinical Goal(s) related to HTN, HLD, and DMII:  Over the next 120 days, patient will:   Work with the care management team to address educational, disease management, and care coordination needs   Begin or continue self health monitoring activities as directed today Measure and record cbg (blood glucose) BID times daily and Measure and record blood pressure 4 times per week, work toward keeping a log of readings, and work toward increasing activity level  Call provider office for new or worsened signs and symptoms Blood glucose findings outside established parameters, Blood pressure findings outside established parameters, and New or worsened symptom related to HTN, Dm2, and HLD  Call care management team with questions or concerns  Verbalize basic understanding of patient centered plan of care established today  Interventions related to HTN, HLD, and DMII:   Evaluation of current treatment plans and patient's adherence to plan as established by provider  Assessed patient understanding of disease states  Assessed patient's education and care coordination needs  Provided disease  specific education to patient: Educated on the benefits of monitoring blood sugars as prescribed and taking blood pressure weekly. The patient states she gets busy and forgets.    Collaborated with appropriate clinical care team members regarding patient needs  Evaluation of the patients compliance of writing down blood sugars and blood pressures on the log provided by the pharmacist in January 2021. Encouraged the patient to start using the log daily. The patient is now writing her blood pressure down. Last blood pressure x 2 days ago was 128/66.  The patient says she is still not taking her blood sugars daily. She forgets as she is busy taking care of her grandchildren. Education on the importance of taking her blood sugars daily BID as prescribed and recording.  Patient states that she has 2 grandchildren that keep her busy.  Evaluation of the patients dietary habits. The patients daughter cooks meals for the patient. The patient states "I don't use much salt".  The member does eat fried foods and processed foods at times, education on Heart Healthy/ADA diet  Discussed working toward a structured exercise plan.  The patient states she has a "bad knee" and doesn't get  out much.  Evaluation of medication compliance with Januvia.  The patient has not picked up the [place] at the pharmacy but verbalized she is going to get her daughter to take her today. She also verbalized that she still has a "couple" of [place] left to take.  The patient states she is now taking the Januvia and other medications as prescribed.   Evaluation of BP readings and blood sugar readings: Last blood pressure was 128/66 (has not taken today), Fasting blood sugars around "150", the patient had  not taken readings this am. Encouraged compliance with taking blood pressures and blood sugars consistently.  The patient call was not long as the patient stated she had to go so she could get her "shot".  Will continue to monitor.    Patient Self Care Activities related to HTN, HLD, and DMII:   Patient is unable to independently self-manage chronic health conditions  Please see past updates related to this goal by clicking on the "Past Updates" button in the selected goal          Plan:   The care management team will reach out to the patient again over the next 60 days.    Noreene Larsson RN, MSN, [facility] Mobile: [phone]

## 2020-03-23 NOTE — Patient Instructions (Signed)
Visit Information  Goals Addressed            This Visit's Progress   . RNCM: I am scared to take that vaccine       Current Barriers:  [place] Kitchen Knowledge Deficits related to COVID-19 and impact on patient self health management  Clinical Goal(s): ID . Over the next 30 days, patient will verbalize basic understanding of COVID-19 impact on individual health and self health management as evidenced by verbalization of basic understanding of COVID-19 as a viral disease, measures to prevent exposure, signs and symptoms, when to contact provider  Interventions: . Provided education to patient re: benefits of the COVID19 vaccine in patients with chronic health conditions . Collaborated with PCP and pharmacist regarding education and assistance with helping the patient understand the negative impact COVID19 can have on patients with chronic medical conditions- patient verbalized she is going to take the COVID19 vaccine today- this will be the first vaccine. . Discussed plans with patient for ongoing care management follow up and provided patient with direct contact information for care management team . Pharmacy referral for assistance with education and support related to COVID19 vaccine- completed- patient is going today to get the first shot for [place]  Patient Self Care Activities:  . Patient verbalizes understanding of plan to talk with pcp and pharmacist concerning the COVID 19 vaccine and her reservations in getting the vaccination . Attends all scheduled provider appointments . Calls provider office for new concerns or questions  Please see past updates related to this goal by clicking on the "Past Updates" button in the selected goal   For information about COVID-19 or "Corona Virus", the following web resources may be helpful:  CDC: BeginnerSteps.be    [place]:   [email or web address]                    . RNCM: I don't take my blood pressure every day and I take my sugars when I think about it       Current Barriers:  . Chronic Disease Management support, education, and care coordination needs related to HTN, HLD, and DMII  Clinical Goal(s) related to HTN, HLD, and DMII:  Over the next 120 days, patient will:  . Work with the care management team to address educational, disease management, and care coordination needs  . Begin or continue self health monitoring activities as directed today Measure and record cbg (blood glucose) BID times daily and Measure and record blood pressure 4 times per week, work toward keeping a log of readings, and work toward increasing activity level . Call provider office for new or worsened signs and symptoms Blood glucose findings outside established parameters, Blood pressure findings outside established parameters, and New or worsened symptom related to HTN, Dm2, and HLD . Call care management team with questions or concerns . Verbalize basic understanding of patient centered plan of care established today  Interventions related to HTN, HLD, and DMII:  . Evaluation of current treatment plans and patient's adherence to plan as established by provider . Assessed patient understanding of disease states . Assessed patient's education and care coordination needs . Provided disease specific education to patient: Educated on the benefits of monitoring blood sugars as prescribed and taking blood pressure weekly. The patient states she gets busy and forgets.   Nash Dimmer with appropriate clinical care team members regarding patient needs . Evaluation of the patients compliance of writing down blood sugars and blood pressures on the log provided  by the pharmacist in January 2021. Encouraged the patient to start using the log  daily. The patient is now writing her blood pressure down. Last blood pressure x 2 days ago was 128/66.  The patient says she is still not taking her blood sugars daily. She forgets as she is busy taking care of her grandchildren. Education on the importance of taking her blood sugars daily BID as prescribed and recording.  Patient states that she has 2 grandchildren that keep her busy. . Evaluation of the patients dietary habits. The patients daughter cooks meals for the patient. The patient states "I don't use much salt".  The member does eat fried foods and processed foods at times, education on Heart Healthy/ADA diet . Discussed working toward a structured exercise plan.  The patient states she has a "bad knee" and doesn't get out much. . Evaluation of medication compliance with Januvia.  The patient has not picked up the [place] at the pharmacy but verbalized she is going to get her daughter to take her today. She also verbalized that she still has a "couple" of [place] left to take.  The patient states she is now taking the Januvia and other medications as prescribed.  . Evaluation of BP readings and blood sugar readings: Last blood pressure was 128/66 (has not taken today), Fasting blood sugars around "150", the patient had  not taken readings this am. Encouraged compliance with taking blood pressures and blood sugars consistently.  The patient call was not long as the patient stated she had to go so she could get her "shot".  Will continue to monitor.   Patient Self Care Activities related to HTN, HLD, and DMII:  . Patient is unable to independently self-manage chronic health conditions  Please see past updates related to this goal by clicking on the "Past Updates" button in the selected goal         Patient verbalizes understanding of instructions provided today.   The care management team will reach out to the patient again over the next 60 days.   Noreene Larsson RN, MSN, [facility] Mobile: [phone]

## 2020-04-19 ENCOUNTER — Other Ambulatory Visit: Payer: Self-pay | Admitting: Family Medicine

## 2020-04-19 DIAGNOSIS — Z1231 Encounter for screening mammogram for malignant neoplasm of breast: Secondary | ICD-10-CM

## 2020-04-30 ENCOUNTER — Ambulatory Visit (INDEPENDENT_AMBULATORY_CARE_PROVIDER_SITE_OTHER): Payer: Medicare Other | Admitting: Family Medicine

## 2020-04-30 ENCOUNTER — Encounter: Payer: Self-pay | Admitting: Family Medicine

## 2020-04-30 VITALS — BP 143/80 | HR 79

## 2020-04-30 DIAGNOSIS — E1142 Type 2 diabetes mellitus with diabetic polyneuropathy: Secondary | ICD-10-CM | POA: Diagnosis not present

## 2020-04-30 DIAGNOSIS — I10 Essential (primary) hypertension: Secondary | ICD-10-CM

## 2020-04-30 DIAGNOSIS — E782 Mixed hyperlipidemia: Secondary | ICD-10-CM

## 2020-04-30 MED ORDER — JARDIANCE 25 MG PO TABS: ORAL_TABLET | ORAL | 1 refills | Status: DC

## 2020-04-30 NOTE — Assessment & Plan Note (Signed)
Mildly elevated today, but typically WNL. Continue current regimen and work on [employer], exercise, weight loss

## 2020-04-30 NOTE — Progress Notes (Signed)
BP (!) 143/80   Pulse 79    Subjective:    [name], female    DOB: 19-Feb-1946, 73 y.o.   MRN: [id number]  HPI: Tami Cummings is a 74 y.o. female  Chief Complaint  Patient presents with  . Diabetes    Needs a refill on Jardiance  . Hyperlipidemia  . Hypertension    . This visit was completed via telephone due to the restrictions of the COVID-19 pandemic. All issues as above were discussed and addressed. Physical exam was done as above through visual confirmation on telephone. If it was felt that the patient should be evaluated in the office, they were directed there. The patient verbally consented to this visit. . Location of the patient: home . Location of the provider: work . Those involved with this call:  . Provider: Merrie Roof, PA-C . CMA: Tiffany Reel, CMA . Front Desk/Registration: Jill Side  . Time spent on call: 25 minutes on the phone discussing health concerns. 5 minutes total spent in review of patient's record and preparation of their chart. I verified patient identity using two factors (patient name and date of birth). Patient consents verbally to being seen via telemedicine visit today.   6 month f/u chronic conditions. Taking medications faithfully without side effects. Denies CP, SOB, HAs, dizziness, low blood sugar spells.   HTN - BPs running typically 130s/80s. Not following strict diet or exercising.   HLD - lovastatin well tolerated, not watching diet or exercising. No claudication, myalgias.   DM - BSs running 100- 150 range. Takes 25 units humalog morning and afternoon and takes between 50-60 units in addition to oral regimen.   Relevant past medical, surgical, family and social history reviewed and updated as indicated. Interim medical history since our last visit reviewed. Allergies and medications reviewed and updated.  Review of Systems  Per HPI unless specifically indicated above     Objective:    BP (!)  143/80   Pulse 79   Wt Readings from Last 3 Encounters:  01/29/20 204 lb (92.5 kg)  10/20/19 197 lb (89.4 kg)  07/18/19 199 lb 9.6 oz (90.5 kg)    Physical Exam  Unable to perform PE due to patient lack of access to video technology for today's visit  Results for orders placed or performed in visit on 01/29/20  HgB A1c  Result Value Ref Range   Hgb A1c MFr Bld 8.5 (H) 4.8 - 5.6 %   Est. average glucose Bld gHb Est-mCnc 197 mg/dL      Assessment & Plan:   Problem List Items Addressed This Visit      Cardiovascular and Mediastinum   Hypertension - Primary    Mildly elevated today, but typically WNL. Continue current regimen and work on [employer], exercise, weight loss      Relevant Orders   Comprehensive metabolic panel     Endocrine   DM type 2 with diabetic peripheral neuropathy ([place])    Recheck A1C, adjust as needed, continue working on diet changes for better control      Relevant Medications   empagliflozin (JARDIANCE) 25 MG TABS tablet   Other Relevant Orders   HgB A1c     Other   Hyperlipidemia    Recheck lipids, adjust as needed. Continue current regimen      Relevant Orders   Lipid Panel w/o Chol/HDL Ratio       Follow up plan: Return in about 6 months (around  10/31/2020) for CPE.

## 2020-04-30 NOTE — Assessment & Plan Note (Signed)
Recheck A1C, adjust as needed, continue working on diet changes for better control

## 2020-04-30 NOTE — Assessment & Plan Note (Signed)
Recheck lipids, adjust as needed. Continue current regimen 

## 2020-05-20 ENCOUNTER — Telehealth: Payer: Self-pay | Admitting: Family Medicine

## 2020-05-20 NOTE — Telephone Encounter (Signed)
Pt called stating that she is needing to have a PA for her insulin, Soliqua. Pt states that the insurance contacted the office last week, and that they stated the PA was denied. Please advise.       [place]  [place] [address]  Phone: [phone] Fax: [phone]  Not a 24 hour pharmacy; exact hours not known.

## 2020-05-21 NOTE — Telephone Encounter (Signed)
Prior Authorization initiated via CoverMyMeds for [employer]: [employer]

## 2020-05-24 ENCOUNTER — Ambulatory Visit: Payer: Medicare Other

## 2020-05-25 ENCOUNTER — Ambulatory Visit (INDEPENDENT_AMBULATORY_CARE_PROVIDER_SITE_OTHER): Payer: Medicare PPO | Admitting: Pharmacist

## 2020-05-25 DIAGNOSIS — I10 Essential (primary) hypertension: Secondary | ICD-10-CM | POA: Diagnosis not present

## 2020-05-25 DIAGNOSIS — E1142 Type 2 diabetes mellitus with diabetic polyneuropathy: Secondary | ICD-10-CM

## 2020-05-25 DIAGNOSIS — E782 Mixed hyperlipidemia: Secondary | ICD-10-CM

## 2020-05-25 NOTE — Telephone Encounter (Signed)
Have we heard a response on this?

## 2020-05-25 NOTE — Chronic Care Management (AMB) (Signed)
Chronic Care Management   Follow Up Note   05/25/2020 Name: Tami Cummings MRN: [id number] DOB: 09/18/1946  Referred by: Volney American, PA-C Reason for referral : Chronic Care Management (Medication Management)   Tami Cummings is a 73 y.o. year old female who is a primary care patient of Volney American, [place]. The CCM team was consulted for assistance with chronic disease management and care coordination needs.    Contacted patient for medication management review.   Review of patient status, including review of consultants reports, relevant laboratory and other test results, and collaboration with appropriate care team members and the patient's provider was performed as part of comprehensive patient evaluation and provision of chronic care management services.    SDOH (Social Determinants of Health) assessments performed: Yes See Care Plan activities for detailed interventions related to [facility])     Outpatient Encounter Medications as of 05/25/2020  Medication Sig Note  . amLODipine (NORVASC) 10 MG tablet Take 1 tablet (10 mg total) by mouth daily.   Marland Kitchen aspirin EC 81 MG tablet Take 81 mg by mouth daily. am   . atenolol (TENORMIN) 100 MG tablet Take 1 tablet (100 mg total) by mouth daily. 10/14/2019: QHS  . Blood Glucose Monitoring Suppl (ONE TOUCH ULTRA 2) w/Device KIT USE ONCE TO TWICE DAILY   . empagliflozin (JARDIANCE) 25 MG TABS tablet TAKE 1 TABLET BY MOUTH ONCE DAILY BEFORE  BREAKFAST   . glucose blood (ONE TOUCH ULTRA TEST) test strip USE ONE TO TWO STRIPS TO CHECK GLUCOSE ONCE DAILY   . Insulin Glargine-Lixisenatide (SOLIQUA) 100-33 UNT-MCG/ML SOPN Inject 60 Units into the skin daily. Start 30 u increase 2 u every 2 days as needed max 60u a day 05/25/2020: 50 units QPM  . insulin lispro (HUMALOG KWIKPEN) 100 UNIT/ML KwikPen Inject 0.25 mLs (25 Units total) into the skin 2 (two) times daily before a meal.   . Insulin Pen Needle (BD PEN NEEDLE MICRO U/F) 32G  X 6 MM MISC USE 4 TO 5 DAILY WITH INSULIN USE   . lovastatin (MEVACOR) 40 MG tablet Take 1 tablet (40 mg total) by mouth daily with breakfast. 10/14/2019: QAM  . sitaGLIPtin (JANUVIA) 50 MG tablet Take 1 tablet (50 mg total) by mouth daily.   Marland Kitchen spironolactone (ALDACTONE) 25 MG tablet Take 1 tablet (25 mg total) by mouth daily. 10/14/2019: QAM  . [DISCONTINUED] saxagliptin HCl (ONGLYZA) 5 MG TABS tablet Take 1 tablet (5 mg total) by mouth daily.    No facility-administered encounter medications on file as of 05/25/2020.     Objective:   Goals Addressed            This Visit's Progress     Patient Stated   . PharmD "I want to work on my sugars" (pt-stated)       Current Barriers:  . Diabetes: uncontrolled; complicated by; most recent A1c 9%; has not come in for lab work yet . Current antihyperglycemic regimen: Jardiance 25 mg daily, Soliqua 50 units daily, Humalog 25 units TID; Januvia 50 mg daily o Intolerance to metformin - diarrhea and nausea . Denies hypoglycemia  . Current exercise: Notes that she has her "hands full" helping take care of her 3 grandchildren . Current blood glucose readings:  o Fasting: 140s; hasn't had a chance to check other times  . Current meal patterns: o Breakfast: cereal; or sausage and egg sandwich; diet pepsi o Snack: sometimes potato chips o Late afternoon meal: daughter cooks; sometimes fried  chicken, mashed potatoes; green beans; sometimes tacos o Drinks: water; diet pepsi; sometimes tea . Cardiovascular risk reduction: o Current hypertensive regimen: amlodipine 10 mg daily QAM, spironolactone 25 mg QAM, atenolol 100 mg QPM; o Current hyperlipidemia regimen: lovastatin 40 mg taking QAM, last LDL 75   Pharmacist Clinical Goal(s):  Marland Kitchen Over the next 90 days, patient will work with PharmD and primary care provider to address optimized medication management  Interventions: . Comprehensive medication review performed, medication list updated in  electronic medical record . Inter-disciplinary care team collaboration (see longitudinal plan of care) . Collaborated w/ AWV LPN. Will coordinate to have patient scheduled for face to face AWV the same day that she can come in and get labs drawn . Sent message to clinical staff to follow up on [place] PA . Pending A1c results, can maximize Soliqua to 60 units daily. Encouraged patient to start checking post-meal blood sugars to evaluate need to increase Humalog dose. If unable to control fastings w/ max dose Soliqua, would plan to split into separate basal insulin + GLP1. If separate GLP1 started and maximized, would d/t Januvia d/t duplicative mechanism of action.  . Will continue to coordinate w/ RN CM for continued diet/lifestyle management support.   Patient Self Care Activities:  . Patient will check blood glucose BID, document, and provide at future appointments . Patient will take medications as prescribed . Patient will report any questions or concerns to provider   Please see past updates related to this goal by clicking on the "Past Updates" button in the selected goal          Plan:  - Scheduled f/u call in ~ 8 weeks  [name], PharmD, [place] [phone]

## 2020-05-25 NOTE — Telephone Encounter (Signed)
PA was approved through Cover my meds.

## 2020-05-25 NOTE — Patient Instructions (Signed)
Visit Information  Goals Addressed            This Visit's Progress     Patient Stated   . PharmD "I want to work on my sugars" (pt-stated)       Current Barriers:  . Diabetes: uncontrolled; complicated by; most recent A1c 9%; has not come in for lab work yet . Current antihyperglycemic regimen: Jardiance 25 mg daily, Soliqua 50 units daily, Humalog 25 units TID; Januvia 50 mg daily o Intolerance to metformin - diarrhea and nausea . Denies hypoglycemia  . Current exercise: Notes that she has her "hands full" helping take care of her 3 grandchildren . Current blood glucose readings:  o Fasting: 140s; hasn't had a chance to check other times  . Current meal patterns: o Breakfast: cereal; or sausage and egg sandwich; diet pepsi o Snack: sometimes potato chips o Late afternoon meal: daughter cooks; sometimes fried chicken, mashed potatoes; green beans; sometimes tacos o Drinks: water; diet pepsi; sometimes tea . Cardiovascular risk reduction: o Current hypertensive regimen: amlodipine 10 mg daily QAM, spironolactone 25 mg QAM, atenolol 100 mg QPM; o Current hyperlipidemia regimen: lovastatin 40 mg taking QAM, last LDL 75   Pharmacist Clinical Goal(s):  Marland Kitchen Over the next 90 days, patient will work with PharmD and primary care provider to address optimized medication management  Interventions: . Comprehensive medication review performed, medication list updated in electronic medical record . Inter-disciplinary care team collaboration (see longitudinal plan of care) . Collaborated w/ AWV LPN. Will coordinate to have patient scheduled for face to face AWV the same day that she can come in and get labs drawn . Sent message to clinical staff to follow up on [place] PA . Pending A1c results, can maximize Soliqua to 60 units daily. Encouraged patient to start checking post-meal blood sugars to evaluate need to increase Humalog dose. If unable to control fastings w/ max dose Soliqua, would plan  to split into separate basal insulin + GLP1. If separate GLP1 started and maximized, would d/t Januvia d/t duplicative mechanism of action.  . Will continue to coordinate w/ RN CM for continued diet/lifestyle management support.   Patient Self Care Activities:  . Patient will check blood glucose BID, document, and provide at future appointments . Patient will take medications as prescribed . Patient will report any questions or concerns to provider   Please see past updates related to this goal by clicking on the "Past Updates" button in the selected goal         Patient verbalizes understanding of instructions provided today.   Plan:  - Scheduled f/u call in ~ 8 weeks  [name], PharmD, [place] [phone]

## 2020-05-27 ENCOUNTER — Ambulatory Visit (INDEPENDENT_AMBULATORY_CARE_PROVIDER_SITE_OTHER): Payer: Medicare PPO

## 2020-05-27 DIAGNOSIS — Z0000 Encounter for general adult medical examination without abnormal findings: Secondary | ICD-10-CM

## 2020-05-27 NOTE — Patient Instructions (Addendum)
Tami Cummings , Thank you for taking time to come for your Medicare Wellness Visit. I appreciate your ongoing commitment to your health goals. Please review the following plan we discussed and let me know if I can assist you in the future.   Screening recommendations/referrals: Colonoscopy: completed 2016, due 2026 Mammogram: scheduled for 06/22/2020 Bone Density: completed, no longer required  Recommended yearly ophthalmology/optometry visit for glaucoma screening and checkup Recommended yearly dental visit for hygiene and checkup  Vaccinations: Influenza vaccine: due 08/2020 Pneumococcal vaccine: completed series  Tdap vaccine: 03/14/2016, due 2027 Shingles vaccine: shingrix eligible, check with your insurance for coverage    Covid-19:completed series   Advanced directives: Please bring a copy of your health care power of attorney and living will to the office at your convenience.  Conditions/risks identified: diabetic, in contact with chronic care management team  Next appointment: Follow up in one year for your annual wellness visit    Preventive Care 65 Years and Older, Female Preventive care refers to lifestyle choices and visits with your health care provider that can promote health and wellness. What does preventive care include?  A yearly physical exam. This is also called an annual well check.  Dental exams once or twice a year.  Routine eye exams. Ask your health care provider how often you should have your eyes checked.  Personal lifestyle choices, including:  Daily care of your teeth and gums.  Regular physical activity.  Eating a healthy diet.  Avoiding tobacco and drug use.  Limiting alcohol use.  Practicing safe sex.  Taking low-dose aspirin every day.  Taking vitamin and mineral supplements as recommended by your health care provider. What happens during an annual well check? The services and screenings done by your health care provider during your  annual well check will depend on your age, overall health, lifestyle risk factors, and family history of disease. Counseling  Your health care provider may ask you questions about your:  Alcohol use.  Tobacco use.  Drug use.  Emotional well-being.  Home and relationship well-being.  Sexual activity.  Eating habits.  History of falls.  Memory and ability to understand (cognition).  Work and work Statistician.  Reproductive health. Screening  You may have the following tests or measurements:  Height, weight, and BMI.  Blood pressure.  Lipid and cholesterol levels. These may be checked every 5 years, or more frequently if you are over 74 years old.  Skin check.  Lung cancer screening. You may have this screening every year starting at age 105 if you have a 30-pack-year history of smoking and currently smoke or have quit within the past 15 years.  Fecal occult blood test (FOBT) of the stool. You may have this test every year starting at age 78.  Flexible sigmoidoscopy or colonoscopy. You may have a sigmoidoscopy every 5 years or a colonoscopy every 10 years starting at age 68.  Hepatitis C blood test.  Hepatitis B blood test.  Sexually transmitted disease (STD) testing.  Diabetes screening. This is done by checking your blood sugar (glucose) after you have not eaten for a while (fasting). You may have this done every 1-3 years.  Bone density scan. This is done to screen for osteoporosis. You may have this done starting at age 51.  Mammogram. This may be done every 1-2 years. Talk to your health care provider about how often you should have regular mammograms. Talk with your health care provider about your test results, treatment options, and if  necessary, the need for more tests. Vaccines  Your health care provider may recommend certain vaccines, such as:  Influenza vaccine. This is recommended every year.  Tetanus, diphtheria, and acellular pertussis (Tdap, Td)  vaccine. You may need a Td booster every 10 years.  Zoster vaccine. You may need this after age 45.  Pneumococcal 13-valent conjugate (PCV13) vaccine. One dose is recommended after age 35.  Pneumococcal polysaccharide (PPSV23) vaccine. One dose is recommended after age 21. Talk to your health care provider about which screenings and vaccines you need and how often you need them. This information is not intended to replace advice given to you by your health care provider. Make sure you discuss any questions you have with your health care provider. Document Released: 12/31/2015 Document Revised: 08/23/2016 Document Reviewed: 10/05/2015 Elsevier Interactive Patient Education  2017 [place] Prevention in the Home Falls can cause injuries. They can happen to people of all ages. There are many things you can do to make your home safe and to help prevent falls. What can I do on the outside of my home?  Regularly fix the edges of walkways and driveways and fix any cracks.  Remove anything that might make you trip as you walk through a door, such as a raised step or threshold.  Trim any bushes or trees on the path to your home.  Use bright outdoor lighting.  Clear any walking paths of anything that might make someone trip, such as rocks or tools.  Regularly check to see if handrails are loose or broken. Make sure that both sides of any steps have handrails.  Any raised decks and porches should have guardrails on the edges.  Have any leaves, snow, or ice cleared regularly.  Use sand or salt on walking paths during winter.  Clean up any spills in your garage right away. This includes oil or grease spills. What can I do in the bathroom?  Use night lights.  Install grab bars by the toilet and in the tub and shower. Do not use towel bars as grab bars.  Use non-skid mats or decals in the tub or shower.  If you need to sit down in the shower, use a plastic, non-slip  stool.  Keep the floor dry. Clean up any water that spills on the floor as soon as it happens.  Remove soap buildup in the tub or shower regularly.  Attach bath mats securely with double-sided non-slip rug tape.  Do not have throw rugs and other things on the floor that can make you trip. What can I do in the bedroom?  Use night lights.  Make sure that you have a light by your bed that is easy to reach.  Do not use any sheets or blankets that are too big for your bed. They should not hang down onto the floor.  Have a firm chair that has side arms. You can use this for support while you get dressed.  Do not have throw rugs and other things on the floor that can make you trip. What can I do in the kitchen?  Clean up any spills right away.  Avoid walking on wet floors.  Keep items that you use a lot in easy-to-reach places.  If you need to reach something above you, use a strong step stool that has a grab bar.  Keep electrical cords out of the way.  Do not use floor polish or wax that makes floors slippery. If you must  use wax, use non-skid floor wax.  Do not have throw rugs and other things on the floor that can make you trip. What can I do with my stairs?  Do not leave any items on the stairs.  Make sure that there are handrails on both sides of the stairs and use them. Fix handrails that are broken or loose. Make sure that handrails are as long as the stairways.  Check any carpeting to make sure that it is firmly attached to the stairs. Fix any carpet that is loose or worn.  Avoid having throw rugs at the top or bottom of the stairs. If you do have throw rugs, attach them to the floor with carpet tape.  Make sure that you have a light switch at the top of the stairs and the bottom of the stairs. If you do not have them, ask someone to add them for you. What else can I do to help prevent falls?  Wear shoes that:  Do not have high heels.  Have rubber bottoms.  Are  comfortable and fit you well.  Are closed at the toe. Do not wear sandals.  If you use a stepladder:  Make sure that it is fully opened. Do not climb a closed stepladder.  Make sure that both sides of the stepladder are locked into place.  Ask someone to hold it for you, if possible.  Clearly mark and make sure that you can see:  Any grab bars or handrails.  First and last steps.  Where the edge of each step is.  Use tools that help you move around (mobility aids) if they are needed. These include:  Canes.  Walkers.  Scooters.  Crutches.  Turn on the lights when you go into a dark area. Replace any light bulbs as soon as they burn out.  Set up your furniture so you have a clear path. Avoid moving your furniture around.  If any of your floors are uneven, fix them.  If there are any pets around you, be aware of where they are.  Review your medicines with your doctor. Some medicines can make you feel dizzy. This can increase your chance of falling. Ask your doctor what other things that you can do to help prevent falls. This information is not intended to replace advice given to you by your health care provider. Make sure you discuss any questions you have with your health care provider. Document Released: 09/30/2009 Document Revised: 05/11/2016 Document Reviewed: 01/08/2015 Elsevier Interactive Patient Education  2017 [employer].

## 2020-05-27 NOTE — Progress Notes (Signed)
Subjective:   Tami Cummings is a 74 y.o. female who presents for Medicare Annual (Subsequent) preventive examination.  I connected with Tami Cummings today by telephone and verified that I am speaking with the correct person using two identifiers. Location patient: home Location provider: work Persons participating in the virtual visit: patient, Marine scientist.    I discussed the limitations, risks, security and privacy concerns of performing an evaluation and management service by telephone and the availability of in person appointments. I also discussed with the patient that there may be a patient responsible charge related to this service. The patient expressed understanding and verbally consented to this telephonic visit.    Interactive audio and video telecommunications were attempted between this provider and patient, however failed, due to patient having technical difficulties OR patient did not have access to video capability.  We continued and completed visit with audio only.  Some vital signs may be absent or patient reported.   Time Spent with patient on telephone encounter: 20 minutes  Review of Systems:   Cardiac Risk Factors include: advanced age (>7mn, >>74women);hypertension;dyslipidemia     Objective:     Vitals: There were no vitals taken for this visit.  There is no height or weight on file to calculate BMI.  Advanced Directives 05/27/2020 05/26/2019 06/03/2018 05/23/2018 09/20/2015  Does Patient Have a Medical Advance Directive? Yes Yes Yes No No  Type of Advance Directive Living will;Healthcare Power of [place] - -  Does patient want to make changes to medical advance directive? - - No - Patient declined - -  Copy of [place]? No - copy requested - No - copy requested - -  Would patient like information on creating a medical advance directive? - - - No - Patient declined No - patient declined  information    Tobacco Social History   Tobacco Use  Smoking Status Former Smoker  . Packs/day: 1.00  . Years: 20.00  . Pack years: 20.00  . Types: Cigarettes  . Quit date: 08/26/2013  . Years since quitting: 6.7  Smokeless Tobacco Never Used     Counseling given: Not Answered   Clinical Intake:  Pre-visit preparation completed: Yes  Pain : No/denies pain     Nutritional Risks: None Diabetes: Yes CBG done?: No Did pt. bring in CBG monitor from home?: No  How often do you need to have someone help you when you read instructions, pamphlets, or other written materials from your doctor or pharmacy?: 1 - Never  Interpreter Needed?: No  Information entered by :: Jahmal Dunavant,LPN  Past Medical History:  Diagnosis Date  . Arthritis    knee  . COPD (chronic obstructive pulmonary disease) ([place]   . Diabetes mellitus without complication ([place]    type 2  . Dyspnea   . Hyperlipidemia   . Hypertension    controlled  . Neuromuscular disorder (HCC)    numbness in hands and feet  . Shoulder pain, right   . Wears dentures    upper and lower   Past Surgical History:  Procedure Laterality Date  . ABDOMINAL HYSTERECTOMY    . BREAST BIOPSY Right 04/12/2016   stereo bx/clip- neg  . CARPAL TUNNEL RELEASE  06/17/00  . CATARACT EXTRACTION W/PHACO Left 09/20/2015   Procedure: CATARACT EXTRACTION PHACO AND INTRAOCULAR LENS PLACEMENT ([place];  Surgeon: ARonnell Freshwater MD;  [place] [facility]  Service: Ophthalmology;  Laterality: Left;  DIABETIC -  insulin  . CATARACT EXTRACTION W/PHACO Right 06/03/2018   Procedure: CATARACT EXTRACTION PHACO AND INTRAOCULAR LENS PLACEMENT ([place]) RIGHT DIABETES;  Surgeon: Eulogio Bear, MD;  Location: [facility];  Service: Ophthalmology;  Laterality: Right;  Diabetes-insulin dependent  . CESAREAN SECTION     x2  . CHOLECYSTECTOMY    . EYE SURGERY     Family History  Problem Relation Age of Onset  . Heart disease  Father   . Cancer Sister   . Cancer Brother   . Breast cancer Neg Hx    Social History   Socioeconomic History  . Marital status: Single    Spouse name: Not on file  . Number of children: Not on file  . Years of education: Not on file  . Highest education level: High school graduate  Occupational History  . Occupation: retired   Tobacco Use  . Smoking status: Former Smoker    Packs/day: 1.00    Years: 20.00    Pack years: 20.00    Types: Cigarettes    Quit date: 08/26/2013    Years since quitting: 6.7  . Smokeless tobacco: Never Used  Vaping Use  . Vaping Use: Never used  Substance and Sexual Activity  . Alcohol use: No    Alcohol/week: 0.0 standard drinks  . Drug use: No  . Sexual activity: Not on file  Other Topics Concern  . Not on file  Social History Narrative   Daughter lives with her currently    Takes care of granddaughter    Social Determinants of Health   Financial Resource Strain: Low Risk   . Difficulty of Paying Living Expenses: Not hard at all  Food Insecurity: No Food Insecurity  . Worried About Charity fundraiser in the Last Year: Never true  . Ran Out of Food in the Last Year: Never true  Transportation Needs: No Transportation Needs  . Lack of Transportation (Medical): No  . Lack of Transportation (Non-Medical): No  Physical Activity: Inactive  . Days of Exercise per Week: 0 days  . Minutes of Exercise per Session: 0 min  Stress: No Stress Concern Present  . Feeling of Stress : Only a little  Social Connections: Socially Isolated  . Frequency of Communication with Friends and Family: More than three times a week  . Frequency of Social Gatherings with Friends and Family: More than three times a week  . Attends Religious Services: Never  . Active Member of Clubs or Organizations: No  . Attends Archivist Meetings: Never  . Marital Status: Divorced    Outpatient Encounter Medications as of 05/27/2020  Medication Sig  . amLODipine  (NORVASC) 10 MG tablet Take 1 tablet (10 mg total) by mouth daily.  Marland Kitchen aspirin EC 81 MG tablet Take 81 mg by mouth daily. am  . atenolol (TENORMIN) 100 MG tablet Take 1 tablet (100 mg total) by mouth daily.  . Blood Glucose Monitoring Suppl (ONE TOUCH ULTRA 2) w/Device KIT USE ONCE TO TWICE DAILY  . empagliflozin (JARDIANCE) 25 MG TABS tablet TAKE 1 TABLET BY MOUTH ONCE DAILY BEFORE  BREAKFAST  . glucose blood (ONE TOUCH ULTRA TEST) test strip USE ONE TO TWO STRIPS TO CHECK GLUCOSE ONCE DAILY  . Insulin Glargine-Lixisenatide (SOLIQUA) 100-33 UNT-MCG/ML SOPN Inject 60 Units into the skin daily. Start 30 u increase 2 u every 2 days as needed max 60u a day  . insulin lispro (HUMALOG KWIKPEN) 100 UNIT/ML KwikPen Inject 0.25 mLs (25 Units total)  into the skin 2 (two) times daily before a meal.  . Insulin Pen Needle (BD PEN NEEDLE MICRO U/F) 32G X 6 MM MISC USE 4 TO 5 DAILY WITH INSULIN USE  . lovastatin (MEVACOR) 40 MG tablet Take 1 tablet (40 mg total) by mouth daily with breakfast.  . sitaGLIPtin (JANUVIA) 50 MG tablet Take 1 tablet (50 mg total) by mouth daily.  Marland Kitchen spironolactone (ALDACTONE) 25 MG tablet Take 1 tablet (25 mg total) by mouth daily.   No facility-administered encounter medications on file as of 05/27/2020.    Activities of Daily Living In your present state of health, do you have any difficulty performing the following activities: 05/27/2020  Hearing? N  Comment no hearing aids  Vision? N  Comment reading glasses, [facility]  Difficulty concentrating or making decisions? N  Walking or climbing stairs? N  Dressing or bathing? N  Doing errands, shopping? N  Preparing Food and eating ? N  Using the Toilet? N  In the past six months, have you accidently leaked urine? N  Do you have problems with loss of bowel control? N  Managing your Medications? N  Managing your Finances? N  Housekeeping or managing your Housekeeping? N  Some recent data might be hidden    Patient  Care Team: Volney American, PA-C as PCP - General (Family Medicine) Kem Parkinson, MD (Ophthalmology) De Hollingshead, [facility] as Pharmacist (Pharmacist) Vanita Ingles, RN as [place] Management (General Practice)    Assessment:   This is a routine wellness examination for Andretta.  Exercise Activities and Dietary recommendations Current Exercise Habits: The patient does not participate in regular exercise at present, Exercise limited by: None identified  Goals Addressed   None     Fall Risk: Fall Risk  05/27/2020 07/18/2019 05/23/2018 01/29/2018 10/16/2017  Falls in the past year? 1 1 No No No  Number falls in past yr: 0 0 - - -  Injury with Fall? 0 0 - - -  Follow up - Falls evaluation completed - - -    FALL RISK PREVENTION PERTAINING TO THE HOME:  Any stairs in or around the home? Yes  If so, are there any without handrails? No   Home free of loose throw rugs in walkways, pet beds, electrical cords, etc? Yes  Adequate lighting in your home to reduce risk of falls? Yes   ASSISTIVE DEVICES UTILIZED TO PREVENT FALLS:  Life alert? No  Use of a cane, walker or w/c? Yes  cane occasionally  Grab bars in the bathroom? No  Shower chair or bench in shower? No  Elevated toilet seat or a handicapped toilet? No   DME ORDERS:  DME order needed?  No   TIMED UP AND GO:  Unable to perform   Depression Screen PHQ 2/9 Scores 05/27/2020 01/23/2020 05/23/2018 01/29/2018  PHQ - 2 Score 0 0 0 0  PHQ- 9 Score - - - 1     Cognitive Function: not indicated      6CIT Screen 05/26/2019 05/23/2018  What Year? 0 points 0 points  What month? 0 points 0 points  What time? 0 points 0 points  Count back from 20 0 points 0 points  Months in reverse 0 points 0 points  Repeat phrase 2 points 2 points  Total Score 2 2    Immunization History  Administered Date(s) Administered  . Influenza, High Dose Seasonal PF 10/30/2016, 10/17/2017, 09/03/2018  .  Influenza,inj,Quad PF,6+ Mos  09/27/2015  . Influenza-Unspecified 08/18/2014, 10/17/2017, 10/12/2019  . PFIZER SARS-COV-2 Vaccination 03/23/2020, 04/13/2020  . Pneumococcal Conjugate-13 12/28/2014  . Pneumococcal-Unspecified 12/30/2012  . Td 05/23/2005, 03/14/2016  . Zoster 03/11/2014    Qualifies for Shingles Vaccine? Yes  Zostavax completed 2015. Due for Shingrix. Education has been provided regarding the importance of this vaccine. Pt has been advised to call insurance company to determine out of pocket expense. Advised may also receive vaccine at local pharmacy or Health Dept. Verbalized acceptance and understanding.  Tdap: up to date   Flu Vaccine: up to date   Pneumococcal Vaccine: up to date    Covid-19 Vaccine: Completed vaccines  Screening Tests Health Maintenance  Topic Date Due  . OPHTHALMOLOGY EXAM  04/02/2019  . FOOT EXAM  05/31/2019  . URINE MICROALBUMIN  12/05/2019  . INFLUENZA VACCINE  07/18/2020  . HEMOGLOBIN A1C  07/28/2020  . MAMMOGRAM  05/22/2021  . COLONOSCOPY  10/24/2025  . TETANUS/TDAP  03/14/2026  . DEXA SCAN  Completed  . COVID-19 Vaccine  Completed  . Hepatitis C Screening  Completed  . PNA vac Low Risk Adult  Completed    Cancer Screenings:  Colorectal Screening: Completed 10/25/2015. Repeat every 10 years  Mammogram: scheduled   Bone Density: Completed   Lung Cancer Screening: (Low Dose CT Chest recommended if Age 44-80 years, 30 pack-year currently smoking OR have quit w/in 15years.) does qualify.   Lung Cancer Screening Referral: An Epic message has been sent to Burgess Estelle, RN (Oncology Nurse Navigator) regarding the possible need for this exam. Raquel Sarna will review the patient's chart to determine if the patient truly qualifies for the exam. If the patient qualifies, Raquel Sarna will order the Low Dose CT of the chest to facilitate the scheduling of this exam.  Additional Screening:  Hepatitis C Screening: does qualify; Completed 2017  Vision  Screening: Recommended annual ophthalmology exams for early detection of glaucoma and other disorders of the eye. Is the patient up to date with their annual eye exam?  Yes  Who is the provider or what is the name of the office in which the pt attends annual eye exams? [facility]    Dental Screening: Recommended annual dental exams for proper oral hygiene  Community Resource Referral:  CRR required this visit?  No      Plan:  I have personally reviewed and addressed the Medicare Annual Wellness questionnaire and have noted the following in the patient's chart:  A. Medical and social history B. Use of alcohol, tobacco or illicit drugs  C. Current medications and supplements D. Functional ability and status E.  Nutritional status F.  Physical activity G. Advance directives H. List of other physicians I.  Hospitalizations, surgeries, and ER visits in previous 12 months J.  [place] such as hearing and vision if needed, cognitive and depression L. Referrals and appointments   In addition, I have reviewed and discussed with patient certain preventive protocols, quality metrics, and best practice recommendations. A written personalized care plan for preventive services as well as general preventive health recommendations were provided to patient.  Signed,    Bevelyn Ngo, LPN  [id number] Nurse Health Advisor   Nurse Notes: none

## 2020-05-28 ENCOUNTER — Other Ambulatory Visit: Payer: Self-pay

## 2020-05-28 ENCOUNTER — Other Ambulatory Visit: Payer: Medicare PPO

## 2020-05-28 DIAGNOSIS — E1142 Type 2 diabetes mellitus with diabetic polyneuropathy: Secondary | ICD-10-CM

## 2020-05-28 DIAGNOSIS — E782 Mixed hyperlipidemia: Secondary | ICD-10-CM

## 2020-05-28 DIAGNOSIS — I10 Essential (primary) hypertension: Secondary | ICD-10-CM

## 2020-05-29 LAB — COMPREHENSIVE METABOLIC PANEL
ALT: 20 IU/L (ref 0–32)
AST: 15 IU/L (ref 0–40)
Albumin/Globulin Ratio: 1.8 (ref 1.2–2.2)
Albumin: 4.2 g/dL (ref 3.7–4.7)
Alkaline Phosphatase: 87 IU/L (ref 48–121)
BUN/Creatinine Ratio: 14 (ref 12–28)
BUN: 15 mg/dL (ref 8–27)
Bilirubin Total: <0.2 mg/dL (ref 0.0–1.2)
CO2: 20 mmol/L (ref 20–29)
Calcium: 9.6 mg/dL (ref 8.7–10.3)
Chloride: 102 mmol/L (ref 96–106)
Creatinine, Ser: 1.07 mg/dL — ABNORMAL HIGH (ref 0.57–1.00)
GFR calc Af Amer: 60 mL/min/{1.73_m2} (ref >59)
GFR calc non Af Amer: 52 mL/min/{1.73_m2} — ABNORMAL LOW (ref >59)
Globulin, Total: 2.3 g/dL (ref 1.5–4.5)
Glucose: 164 mg/dL — ABNORMAL HIGH (ref 65–99)
Potassium: 4.3 mmol/L (ref 3.5–5.2)
Sodium: 137 mmol/L (ref 134–144)
Total Protein: 6.5 g/dL (ref 6.0–8.5)

## 2020-05-29 LAB — LIPID PANEL W/O CHOL/HDL RATIO
Cholesterol, Total: 150 mg/dL (ref 100–199)
HDL: 35 mg/dL — ABNORMAL LOW (ref >39)
LDL Chol Calc (NIH): 95 mg/dL (ref 0–99)
Triglycerides: 108 mg/dL (ref 0–149)
VLDL Cholesterol Cal: 20 mg/dL (ref 5–40)

## 2020-05-29 LAB — HEMOGLOBIN A1C
Est. average glucose Bld gHb Est-mCnc: 209 mg/dL
Hgb A1c MFr Bld: 8.9 % — ABNORMAL HIGH (ref 4.8–5.6)

## 2020-06-02 ENCOUNTER — Ambulatory Visit: Payer: Self-pay | Admitting: General Practice

## 2020-06-02 ENCOUNTER — Telehealth: Payer: Self-pay | Admitting: General Practice

## 2020-06-02 DIAGNOSIS — E1142 Type 2 diabetes mellitus with diabetic polyneuropathy: Secondary | ICD-10-CM

## 2020-06-02 DIAGNOSIS — E782 Mixed hyperlipidemia: Secondary | ICD-10-CM | POA: Diagnosis not present

## 2020-06-02 DIAGNOSIS — I10 Essential (primary) hypertension: Secondary | ICD-10-CM

## 2020-06-02 NOTE — Chronic Care Management (AMB) (Signed)
Chronic Care Management   Follow Up Note   06/02/2020 Name: Jenisse Vullo MRN: [id number] DOB: [birth year]  Referred by: Volney American, PA-C Reason for referral : Chronic Care Management (Follow up: RNCM Chronic Disease Management and Care Coordination Needs)   Shawntae Lowy Espinal is a 74 y.o. year old female who is a primary care patient of Volney American, [place]. The CCM team was consulted for assistance with chronic disease management and care coordination needs.    Review of patient status, including review of consultants reports, relevant laboratory and other test results, and collaboration with appropriate care team members and the patient's provider was performed as part of comprehensive patient evaluation and provision of chronic care management services.    SDOH (Social Determinants of Health) assessments performed: Yes See Care Plan activities for detailed interventions related to [facility])     Outpatient Encounter Medications as of 06/02/2020  Medication Sig Note  . amLODipine (NORVASC) 10 MG tablet Take 1 tablet (10 mg total) by mouth daily.   Marland Kitchen aspirin EC 81 MG tablet Take 81 mg by mouth daily. am   . atenolol (TENORMIN) 100 MG tablet Take 1 tablet (100 mg total) by mouth daily. 10/14/2019: QHS  . Blood Glucose Monitoring Suppl (ONE TOUCH ULTRA 2) w/Device KIT USE ONCE TO TWICE DAILY   . empagliflozin (JARDIANCE) 25 MG TABS tablet TAKE 1 TABLET BY MOUTH ONCE DAILY BEFORE  BREAKFAST   . glucose blood (ONE TOUCH ULTRA TEST) test strip USE ONE TO TWO STRIPS TO CHECK GLUCOSE ONCE DAILY   . Insulin Glargine-Lixisenatide (SOLIQUA) 100-33 UNT-MCG/ML SOPN Inject 60 Units into the skin daily. Start 30 u increase 2 u every 2 days as needed max 60u a day 05/25/2020: 50 units QPM  . insulin lispro (HUMALOG KWIKPEN) 100 UNIT/ML KwikPen Inject 0.25 mLs (25 Units total) into the skin 2 (two) times daily before a meal.   . Insulin Pen Needle (BD PEN NEEDLE MICRO U/F) 32G X 6  MM MISC USE 4 TO 5 DAILY WITH INSULIN USE   . lovastatin (MEVACOR) 40 MG tablet Take 1 tablet (40 mg total) by mouth daily with breakfast. 10/14/2019: QAM  . sitaGLIPtin (JANUVIA) 50 MG tablet Take 1 tablet (50 mg total) by mouth daily.   Marland Kitchen spironolactone (ALDACTONE) 25 MG tablet Take 1 tablet (25 mg total) by mouth daily. 10/14/2019: QAM   No facility-administered encounter medications on file as of 06/02/2020.     Objective:   Goals Addressed            This Visit's Progress   . RNCM: I don't take my blood pressure every day and I take my sugars when I think about it       Current Barriers:  . Chronic Disease Management support, education, and care coordination needs related to HTN, HLD, and DMII  Clinical Goal(s) related to HTN, HLD, and DMII:  Over the next 120 days, patient will:  . Work with the care management team to address educational, disease management, and care coordination needs  . Begin or continue self health monitoring activities as directed today Measure and record cbg (blood glucose) BID times daily and Measure and record blood pressure 4 times per week, work toward keeping a log of readings, and work toward increasing activity level . Call provider office for new or worsened signs and symptoms Blood glucose findings outside established parameters, Blood pressure findings outside established parameters, and New or worsened symptom related to HTN, Dm2, and  HLD . Call care management team with questions or concerns . Verbalize basic understanding of patient centered plan of care established today  Interventions related to HTN, HLD, and DMII:  . Evaluation of current treatment plans and patient's adherence to plan as established by provider.  The patient endorses compliance with medications and plan of care.  . Assessed patient understanding of disease states.  The patient has a good understanding of her chronic conditions.  The patient endorses checking blood sugars.   Yesterday her blood sugar was 151.  The patient states she usually has blood sugars are 120 to 160's.   . Assessed patient's education and care coordination needs.  Education on the benefits of a heart healthy/ADA diet. The patient states she only adds salt to some of her food. The patient denies using a lot of sodium. The patient states that her weakness is "sweets".  Education on Fiber one options for satisfying her sweet cravings. The patient will check into this. Education and support given.  . Provided disease specific education to patient: Educated on the benefits of monitoring blood sugars as prescribed and taking blood pressure weekly. The patient states she gets busy and forgets.   Nash Dimmer with appropriate clinical care team members regarding patient needs . Evaluation of the patients compliance of writing down blood sugars and blood pressures on the log provided by the pharmacist in January 2021. Encouraged the patient to start using the log daily. The patient is now writing her blood pressure down.  The patient says she is still not taking her blood sugars daily. She had not taken this am but yesterday am was 151. She forgets as she is busy taking care of her grandchildren. Education on the importance of taking her blood sugars daily BID as prescribed and recording.  Patient states that she has 2 grandchildren that keep her busy. . Evaluation of the patients dietary habits. The patients daughter cooks meals for the patient. The patient states "I don't use much salt".  The member does eat fried foods and processed foods at times, education on Heart Healthy/ADA diet . Discussed working toward a structured exercise plan.  The patient states she has a "bad knee" and doesn't get out much. . Evaluation of medication compliance with Januvia.  The patient endorses compliance with Januvia . Evaluation of upcoming appointments: Next appointment with the pcp is 11-19.    Patient Self Care Activities  related to HTN, HLD, and DMII:  . Patient is unable to independently self-manage chronic health conditions  Please see past updates related to this goal by clicking on the "Past Updates" button in the selected goal          Plan:   Telephone follow up appointment with care management team member scheduled for: 08-04-2020 at [place], MSN, [facility] Mobile: [phone]

## 2020-06-02 NOTE — Patient Instructions (Signed)
Visit Information  Goals Addressed            This Visit's Progress   . RNCM: I don't take my blood pressure every day and I take my sugars when I think about it       Current Barriers:  . Chronic Disease Management support, education, and care coordination needs related to HTN, HLD, and DMII  Clinical Goal(s) related to HTN, HLD, and DMII:  Over the next 120 days, patient will:  . Work with the care management team to address educational, disease management, and care coordination needs  . Begin or continue self health monitoring activities as directed today Measure and record cbg (blood glucose) BID times daily and Measure and record blood pressure 4 times per week, work toward keeping a log of readings, and work toward increasing activity level . Call provider office for new or worsened signs and symptoms Blood glucose findings outside established parameters, Blood pressure findings outside established parameters, and New or worsened symptom related to HTN, Dm2, and HLD . Call care management team with questions or concerns . Verbalize basic understanding of patient centered plan of care established today  Interventions related to HTN, HLD, and DMII:  . Evaluation of current treatment plans and patient's adherence to plan as established by provider.  The patient endorses compliance with medications and plan of care.  . Assessed patient understanding of disease states.  The patient has a good understanding of her chronic conditions.  The patient endorses checking blood sugars.  Yesterday her blood sugar was 151.  The patient states she usually has blood sugars are 120 to 160's.   . Assessed patient's education and care coordination needs.  Education on the benefits of a heart healthy/ADA diet. The patient states she only adds salt to some of her food. The patient denies using a lot of sodium. The patient states that her weakness is "sweets".  Education on Fiber one options for satisfying her  sweet cravings. The patient will check into this. Education and support given.  . Provided disease specific education to patient: Educated on the benefits of monitoring blood sugars as prescribed and taking blood pressure weekly. The patient states she gets busy and forgets.   Nash Dimmer with appropriate clinical care team members regarding patient needs . Evaluation of the patients compliance of writing down blood sugars and blood pressures on the log provided by the pharmacist in January 2021. Encouraged the patient to start using the log daily. The patient is now writing her blood pressure down.  The patient says she is still not taking her blood sugars daily. She had not taken this am but yesterday am was 151. She forgets as she is busy taking care of her grandchildren. Education on the importance of taking her blood sugars daily BID as prescribed and recording.  Patient states that she has 2 grandchildren that keep her busy. . Evaluation of the patients dietary habits. The patients daughter cooks meals for the patient. The patient states "I don't use much salt".  The member does eat fried foods and processed foods at times, education on Heart Healthy/ADA diet . Discussed working toward a structured exercise plan.  The patient states she has a "bad knee" and doesn't get out much. . Evaluation of medication compliance with Januvia.  The patient endorses compliance with Januvia . Evaluation of upcoming appointments: Next appointment with the pcp is 11-19.    Patient Self Care Activities related to HTN, HLD, and DMII:  .  Patient is unable to independently self-manage chronic health conditions  Please see past updates related to this goal by clicking on the "Past Updates" button in the selected goal         Patient verbalizes understanding of instructions provided today.   Telephone follow up appointment with care management team member scheduled for: 08-04-2020 at 0930 am  Noreene Larsson RN, MSN,  [facility] Mobile: [phone]

## 2020-06-08 ENCOUNTER — Telehealth: Payer: Self-pay | Admitting: Family Medicine

## 2020-06-08 NOTE — Telephone Encounter (Signed)
Called regarding lab results, normal other than A1C is still above goal at 8.9. Pt has been working on diet changes and would like to try that and recheck in 3 months rather than Endocrinology referral at this time. Continue current medications and close monitoring  **Please reschedule her Nov appt to 3 months out

## 2020-06-08 NOTE — Telephone Encounter (Signed)
Copied from [place] [id number]. Topic: Quick Communication - See Telephone Encounter >> Jun 08, 2020 11:19 AM [place] wrote: CRM for notification. See Telephone encounter for: 06/08/20. PLS fu with pt re labs, pt missed call, noticed probably was lab results call after pt disconnected. FU

## 2020-06-22 ENCOUNTER — Inpatient Hospital Stay: Admission: RE | Admit: 2020-06-22 | Payer: Medicare Other | Source: Ambulatory Visit

## 2020-06-28 ENCOUNTER — Other Ambulatory Visit: Payer: Self-pay | Admitting: Family Medicine

## 2020-06-28 ENCOUNTER — Other Ambulatory Visit: Payer: Self-pay

## 2020-06-28 MED ORDER — INSULIN LISPRO (1 UNIT DIAL) 100 UNIT/ML (KWIKPEN): 25.0000 [IU] | PEN_INJECTOR | Freq: Two times a day (BID) | SUBCUTANEOUS | 11 refills | Status: DC

## 2020-06-28 MED ORDER — SOLIQUA 100-33 UNT-MCG/ML ~~LOC~~ SOPN: 60.0000 [IU] | PEN_INJECTOR | Freq: Every day | SUBCUTANEOUS | 12 refills | Status: DC

## 2020-06-28 NOTE — Telephone Encounter (Signed)
Medication Refill - Medication: Insulin Glargine-Lixisenatide (SOLIQUA) 100-33 UNT-MCG/ML SOPN   insulin lispro (HUMALOG KWIKPEN) 100 UNIT/ML KwikPen  Has the patient contacted their pharmacy? No. (Agent: If no, request that the patient contact the pharmacy for the refill.) (Agent: If yes, when and what did the pharmacy advise?)  Preferred Pharmacy (with phone number or street name): [place], [place] - [place]  [address] [place] [address]  Phone:  [phone] Fax:  [phone]    Agent: Please be advised that RX refills may take up to 3 business days. We ask that you follow-up with your pharmacy.

## 2020-06-28 NOTE — Telephone Encounter (Signed)
Soliqua refilled in another encounter.

## 2020-06-28 NOTE — Telephone Encounter (Signed)
Patient last seen 04/30/20 

## 2020-07-01 ENCOUNTER — Ambulatory Visit
Admission: RE | Admit: 2020-07-01 | Discharge: 2020-07-01 | Disposition: A | Discharge status: Home / Self Care | Payer: Medicare Other | Source: Ambulatory Visit | Attending: Family Medicine | Admitting: Family Medicine

## 2020-07-01 ENCOUNTER — Other Ambulatory Visit: Payer: Self-pay

## 2020-07-01 DIAGNOSIS — Z1231 Encounter for screening mammogram for malignant neoplasm of breast: Secondary | ICD-10-CM | POA: Insufficient documentation

## 2020-07-14 ENCOUNTER — Other Ambulatory Visit: Payer: Self-pay

## 2020-07-14 MED ORDER — AMLODIPINE BESYLATE 10 MG PO TABS: 10.0000 mg | ORAL_TABLET | Freq: Every day | ORAL | 0 refills | Status: DC

## 2020-07-14 NOTE — Telephone Encounter (Signed)
Refill request and pharmacy needs new Rx for Amlodipine LOV: 04/30/2020 Next Appt: 12/31/2020

## 2020-07-20 ENCOUNTER — Other Ambulatory Visit: Payer: Self-pay

## 2020-07-20 DIAGNOSIS — I10 Essential (primary) hypertension: Secondary | ICD-10-CM

## 2020-07-20 MED ORDER — ATENOLOL 100 MG PO TABS: 100.0000 mg | ORAL_TABLET | Freq: Every day | ORAL | 0 refills | Status: DC

## 2020-07-21 ENCOUNTER — Telehealth: Payer: Self-pay

## 2020-08-02 ENCOUNTER — Encounter: Payer: Self-pay | Admitting: Family Medicine

## 2020-08-04 ENCOUNTER — Telehealth: Payer: Self-pay | Admitting: General Practice

## 2020-08-04 ENCOUNTER — Ambulatory Visit (INDEPENDENT_AMBULATORY_CARE_PROVIDER_SITE_OTHER): Payer: Medicare Other | Admitting: General Practice

## 2020-08-04 DIAGNOSIS — E782 Mixed hyperlipidemia: Secondary | ICD-10-CM | POA: Diagnosis not present

## 2020-08-04 DIAGNOSIS — E1142 Type 2 diabetes mellitus with diabetic polyneuropathy: Secondary | ICD-10-CM | POA: Diagnosis not present

## 2020-08-04 DIAGNOSIS — I10 Essential (primary) hypertension: Secondary | ICD-10-CM

## 2020-08-04 NOTE — Chronic Care Management (AMB) (Signed)
Chronic Care Management   Follow Up Note   08/04/2020 Name: Tami Cummings MRN: [id number] DOB: 10/24/1946  Referred by: Volney American, PA-C Reason for referral : Chronic Care Management (RNCM Follow up Call: Chronic Disease Management and Care Coordination Needs )   Tami Cummings is a 74 y.o. year old female who is a primary care patient of Volney American, [place]. The CCM team was consulted for assistance with chronic disease management and care coordination needs.    Review of patient status, including review of consultants reports, relevant laboratory and other test results, and collaboration with appropriate care team members and the patient's provider was performed as part of comprehensive patient evaluation and provision of chronic care management services.    SDOH (Social Determinants of Health) assessments performed: Yes See Care Plan activities for detailed interventions related to [facility])     Outpatient Encounter Medications as of 08/04/2020  Medication Sig Note  . amLODipine (NORVASC) 10 MG tablet Take 1 tablet (10 mg total) by mouth daily.   Marland Kitchen aspirin EC 81 MG tablet Take 81 mg by mouth daily. am   . atenolol (TENORMIN) 100 MG tablet Take 1 tablet (100 mg total) by mouth daily.   . Blood Glucose Monitoring Suppl (ONE TOUCH ULTRA 2) w/Device KIT USE ONCE TO TWICE DAILY   . empagliflozin (JARDIANCE) 25 MG TABS tablet TAKE 1 TABLET BY MOUTH ONCE DAILY BEFORE  BREAKFAST   . glucose blood (ONE TOUCH ULTRA TEST) test strip USE ONE TO TWO STRIPS TO CHECK GLUCOSE ONCE DAILY   . Insulin Glargine-Lixisenatide (SOLIQUA) 100-33 UNT-MCG/ML SOPN Inject 60 Units into the skin daily. Start 30 u increase 2 u every 2 days as needed max 60u a day   . insulin lispro (HUMALOG KWIKPEN) 100 UNIT/ML KwikPen Inject 0.25 mLs (25 Units total) into the skin 2 (two) times daily before a meal.   . Insulin Pen Needle (BD PEN NEEDLE MICRO U/F) 32G X 6 MM MISC USE 4 TO 5 DAILY WITH  INSULIN USE   . lovastatin (MEVACOR) 40 MG tablet Take 1 tablet (40 mg total) by mouth daily with breakfast. 10/14/2019: QAM  . sitaGLIPtin (JANUVIA) 50 MG tablet Take 1 tablet (50 mg total) by mouth daily.   Marland Kitchen spironolactone (ALDACTONE) 25 MG tablet Take 1 tablet (25 mg total) by mouth daily. 10/14/2019: QAM   No facility-administered encounter medications on file as of 08/04/2020.     Objective:  BP Readings from Last 3 Encounters:  04/30/20 (!) 143/80  03/21/20 128/66  01/29/20 (!) 145/73    Goals Addressed            This Visit's Progress   . RNCM: I don't take my blood pressure every day and I take my sugars when I think about it       Current Barriers:  . Chronic Disease Management support, education, and care coordination needs related to HTN, HLD, and DMII  Clinical Goal(s) related to HTN, HLD, and DMII:  Over the next 120 days, patient will:  . Work with the care management team to address educational, disease management, and care coordination needs  . Begin or continue self health monitoring activities as directed today Measure and record cbg (blood glucose) BID times daily and Measure and record blood pressure 4 times per week, work toward keeping a log of readings, and work toward increasing activity level . Call provider office for new or worsened signs and symptoms Blood glucose findings outside established  parameters, Blood pressure findings outside established parameters, and New or worsened symptom related to HTN, Dm2, and HLD . Call care management team with questions or concerns . Verbalize basic understanding of patient centered plan of care established today  Interventions related to HTN, HLD, and DMII:  . Evaluation of current treatment plans and patient's adherence to plan as established by provider.  The patient endorses compliance with medications and plan of care.  . Assessed patient understanding of disease states.  The patient has a good understanding of  her chronic conditions.  The patient endorses checking blood sugars. 08-04-2020: The patient had not taken her blood sugar this am but states her range has been 125-140.  The patient denies any episodes of hypoglycemia.  . Assessed patient's education and care coordination needs.  Education on the benefits of a heart healthy/ADA diet. The patient states she only adds salt to some of her food. The patient denies using a lot of sodium. The patient states that her weakness is "sweets".  Education on Fiber one options for satisfying her sweet cravings. The patient will check into this. Education and support given. 08-04-2020: the patient states she is trying to be more mindful of her diet. She does not eat a lot. The patient is enjoying some of the fruits and vegetables during the season now. The patient denies any issues related to dietary restrictions.  . Provided disease specific education to patient: Educated on the benefits of monitoring blood sugars as prescribed and taking blood pressure weekly. The patient states she gets busy and forgets.  08-04-2020: The patient states she has not been taking her blood pressure a lot but when she does it is really good. The patient denies any headache or other issues related to elevated blood pressures.  Nash Dimmer with appropriate clinical care team members regarding patient needs . Evaluation of the patients compliance of writing down blood sugars and blood pressures on the log provided by the pharmacist in January 2021. Encouraged the patient to start using the log daily. The patient is now writing her blood pressure down.  The patient says she is still not taking her blood sugars daily. She had not taken this am but yesterday am was 151. She forgets as she is busy taking care of her grandchildren. Education on the importance of taking her blood sugars daily BID as prescribed and recording.  Patient states that she has 2 grandchildren that keep her busy. 08-04-2020: the  patient is not writing down her readings but can give the RNCM some past readings. The patient states she feels well and is happy to be celebrating her 74rd birthday today. Reminders during outreaches to take as recommended and write down.  . Evaluation of the patients dietary habits. The patients daughter cooks meals for the patient. The patient states "I don't use much salt".  The member does eat fried foods and processed foods at times, education on Heart Healthy/ADA diet . Discussed working toward a structured exercise plan.  The patient states she has a "bad knee" and doesn't get out much. . Evaluation of medication compliance with Januvia.  The patient endorses compliance with Januvia.  08-04-2020: Review of medications and any new concerns. The patient states that she is not having issues with her medications.  . Evaluation of upcoming appointments: Next appointment with the pcp is 12-31-2020.   Marland Kitchen Review of a letter the patient received in the mail from [facility].  The patient states she can not come to  the office anymore because Dr. Jeananne Rama is not covered. Ask the patient to call the office and talk to the staff about the letter. Explained that Dr. Jeananne Rama had retired but there were other doctors in the practice to help her. The patient verbalized that she will call the office and talk to the staff.   Patient Self Care Activities related to HTN, HLD, and DMII:  . Patient is unable to independently self-manage chronic health conditions  Please see past updates related to this goal by clicking on the "Past Updates" button in the selected goal          Plan:   Telephone follow up appointment with care management team member scheduled for: 10-06-2020 at 9 am   Ryan, MSN, [facility] Mobile: [phone]

## 2020-08-04 NOTE — Patient Instructions (Signed)
Visit Information  Goals Addressed            This Visit's Progress   . RNCM: I don't take my blood pressure every day and I take my sugars when I think about it       Current Barriers:  . Chronic Disease Management support, education, and care coordination needs related to HTN, HLD, and DMII  Clinical Goal(s) related to HTN, HLD, and DMII:  Over the next 120 days, patient will:  . Work with the care management team to address educational, disease management, and care coordination needs  . Begin or continue self health monitoring activities as directed today Measure and record cbg (blood glucose) BID times daily and Measure and record blood pressure 4 times per week, work toward keeping a log of readings, and work toward increasing activity level . Call provider office for new or worsened signs and symptoms Blood glucose findings outside established parameters, Blood pressure findings outside established parameters, and New or worsened symptom related to HTN, Dm2, and HLD . Call care management team with questions or concerns . Verbalize basic understanding of patient centered plan of care established today  Interventions related to HTN, HLD, and DMII:  . Evaluation of current treatment plans and patient's adherence to plan as established by provider.  The patient endorses compliance with medications and plan of care.  . Assessed patient understanding of disease states.  The patient has a good understanding of her chronic conditions.  The patient endorses checking blood sugars. 08-04-2020: The patient had not taken her blood sugar this am but states her range has been 125-140.  The patient denies any episodes of hypoglycemia.  . Assessed patient's education and care coordination needs.  Education on the benefits of a heart healthy/ADA diet. The patient states she only adds salt to some of her food. The patient denies using a lot of sodium. The patient states that her weakness is "sweets".   Education on Fiber one options for satisfying her sweet cravings. The patient will check into this. Education and support given. 08-04-2020: the patient states she is trying to be more mindful of her diet. She does not eat a lot. The patient is enjoying some of the fruits and vegetables during the season now. The patient denies any issues related to dietary restrictions.  . Provided disease specific education to patient: Educated on the benefits of monitoring blood sugars as prescribed and taking blood pressure weekly. The patient states she gets busy and forgets.  08-04-2020: The patient states she has not been taking her blood pressure a lot but when she does it is really good. The patient denies any headache or other issues related to elevated blood pressures.  Nash Dimmer with appropriate clinical care team members regarding patient needs . Evaluation of the patients compliance of writing down blood sugars and blood pressures on the log provided by the pharmacist in January 2021. Encouraged the patient to start using the log daily. The patient is now writing her blood pressure down.  The patient says she is still not taking her blood sugars daily. She had not taken this am but yesterday am was 151. She forgets as she is busy taking care of her grandchildren. Education on the importance of taking her blood sugars daily BID as prescribed and recording.  Patient states that she has 2 grandchildren that keep her busy. 08-04-2020: the patient is not writing down her readings but can give the RNCM some past readings. The patient  states she feels well and is happy to be celebrating her 74rd birthday today. Reminders during outreaches to take as recommended and write down.  . Evaluation of the patients dietary habits. The patients daughter cooks meals for the patient. The patient states "I don't use much salt".  The member does eat fried foods and processed foods at times, education on Heart Healthy/ADA  diet . Discussed working toward a structured exercise plan.  The patient states she has a "bad knee" and doesn't get out much. . Evaluation of medication compliance with Januvia.  The patient endorses compliance with Januvia.  08-04-2020: Review of medications and any new concerns. The patient states that she is not having issues with her medications.  . Evaluation of upcoming appointments: Next appointment with the pcp is 12-31-2020.   Marland Kitchen Review of a letter the patient received in the mail from [facility].  The patient states she can not come to the office anymore because Dr. Jeananne Rama is not covered. Ask the patient to call the office and talk to the staff about the letter. Explained that Dr. Jeananne Rama had retired but there were other doctors in the practice to help her. The patient verbalized that she will call the office and talk to the staff.   Patient Self Care Activities related to HTN, HLD, and DMII:  . Patient is unable to independently self-manage chronic health conditions  Please see past updates related to this goal by clicking on the "Past Updates" button in the selected goal         Patient verbalizes understanding of instructions provided today.   Telephone follow up appointment with care management team member scheduled for: 10-06-2020 at 09 am  [place], MSN, [facility] Mobile: [phone]

## 2020-08-19 ENCOUNTER — Other Ambulatory Visit: Payer: Self-pay

## 2020-08-19 DIAGNOSIS — E7800 Pure hypercholesterolemia, unspecified: Secondary | ICD-10-CM

## 2020-08-19 NOTE — Telephone Encounter (Signed)
Previous RL patient. Last seen 04/30/20

## 2020-08-20 ENCOUNTER — Telehealth: Payer: Self-pay

## 2020-08-20 MED ORDER — LOVASTATIN 40 MG PO TABS: 40.0000 mg | ORAL_TABLET | Freq: Every day | ORAL | 4 refills | Status: DC

## 2020-09-07 ENCOUNTER — Telehealth: Payer: Self-pay | Admitting: Pharmacist

## 2020-09-07 NOTE — Progress Notes (Signed)
Spoke with patient and rescheduled appointment from 09-15-20 to October 29 th , 2021 at 10 am .   Georgiana Shore ,[place] Pharmacist Assistant [phone]

## 2020-09-10 ENCOUNTER — Telehealth: Payer: Self-pay

## 2020-09-15 ENCOUNTER — Telehealth: Payer: Self-pay

## 2020-09-27 ENCOUNTER — Other Ambulatory Visit: Payer: Self-pay | Admitting: Family Medicine

## 2020-09-27 NOTE — Telephone Encounter (Signed)
Requested medication (s) are due for refill today:yes  Requested medication (s) are on the active medication list:yes  Last refill: 01/29/20  #90 1 refill  Future visit scheduled: yes  Notes to clinic:  Cr elevated 1.07 05/28/20    Requested Prescriptions  Pending Prescriptions Disp Refills   JANUVIA 50 MG tablet [Pharmacy Med Name: Januvia 50 MG Oral Tablet] 90 tablet 0    Sig: Take 1 tablet by mouth once daily      Endocrinology:  Diabetes - DPP-4 Inhibitors Failed - 09/27/2020  1:28 PM      Failed - HBA1C is between 0 and 7.9 and within 180 days    Hemoglobin A1C  Date Value Ref Range Status  07/13/2016 7.9  Final   HB A1C (BAYER DCA - WAIVED)  Date Value Ref Range Status  12/04/2018 7.8 (H) <7.0 % Final    Comment:                                          Diabetic Adult            <7.0                                       Healthy Adult        4.3 - 5.7                                                           (DCCT/NGSP) American Diabetes Association's Summary of Glycemic Recommendations for Adults with Diabetes: Hemoglobin A1c <7.0%. More stringent glycemic goals (A1c <6.0%) may further reduce complications at the cost of increased risk of hypoglycemia.    Hgb A1c MFr Bld  Date Value Ref Range Status  05/28/2020 8.9 (H) 4.8 - 5.6 % Final    Comment:             Prediabetes: 5.7 - 6.4          Diabetes: >6.4          Glycemic control for adults with diabetes: <7.0           Failed - Cr in normal range and within 360 days    Creatinine, Ser  Date Value Ref Range Status  05/28/2020 1.07 (H) 0.57 - 1.00 mg/dL Final          Passed - Valid encounter within last 6 months    Recent Outpatient Visits           5 months ago Essential hypertension   [place], [place], [place]   8 months ago DM type 2 with diabetic peripheral neuropathy [facility])   [place], [place], [place]   11 months ago DM type 2 with  diabetic peripheral neuropathy [facility])   [facility] Volney American, [place]   1 year ago Injury of toenail of right foot, initial encounter   [facility] Volney American, [place]   1 year ago Essential hypertension, benign   [place], Jeannette How, MD       Future Appointments  In 3 months Cannady, Barbaraann Faster, NP [employer]   In 8 months  [employer]

## 2020-09-27 NOTE — Telephone Encounter (Signed)
Previous RL patient. Last seen in May and has appointment in January.

## 2020-09-29 ENCOUNTER — Other Ambulatory Visit: Payer: Self-pay

## 2020-09-29 DIAGNOSIS — I10 Essential (primary) hypertension: Secondary | ICD-10-CM

## 2020-09-29 MED ORDER — SPIRONOLACTONE 25 MG PO TABS: 25.0000 mg | ORAL_TABLET | Freq: Every day | ORAL | 1 refills | Status: DC

## 2020-10-04 ENCOUNTER — Ambulatory Visit: Payer: Self-pay | Admitting: Pharmacist

## 2020-10-04 DIAGNOSIS — E782 Mixed hyperlipidemia: Secondary | ICD-10-CM

## 2020-10-04 DIAGNOSIS — E1142 Type 2 diabetes mellitus with diabetic polyneuropathy: Secondary | ICD-10-CM

## 2020-10-04 NOTE — Patient Instructions (Signed)
Visit Information  It was a pleasure speaking with you today. Thank you for letting me be part of your clinical team. Please call with any questions or concerns.   Goals Addressed            This Visit's Progress   . Pharmacy care plan       CARE PLAN ENTRY (see longitudinal plan of care for additional care plan information)  Current Barriers:  . Chronic Disease Management support, education, and care coordination needs related to Hypertension, Hyperlipidemia, and Diabetes   Hypertension BP Readings from Last 3 Encounters:  04/30/20 (!) 143/80  03/21/20 128/66  01/29/20 (!) 145/73   . Pharmacist Clinical Goal(s): o Over the next 60 days, patient will work with PharmD and providers to achieve BP goal <130/80 . Current regimen:  . Amlodipine 10 mg qd . Atenolol 100mg  qhs  . Spironolactone 25 mg qd . Interventions: o Provided diet and exercise counseling o Discussed importance of home BP monitorien . Patient self care activities - Over the next 60 days, patient will: o Check BP 3-4 times weekly, document, and provide at future appointments o Ensure daily salt intake < 2300 mg/day o Obtain new BP cuff  Hyperlipidemia Lab Results  Component Value Date/Time   LDLCALC 95 05/28/2020 10:41 AM   . Pharmacist Clinical Goal(s): o Over the next 60 days, patient will work with PharmD and providers to achieve LDL goal < 70 . Current regimen:  o Lovastatin 40 mg qd o Aspirin 81 mg qd  Interventions: o Reviewed risk vs benefit of asa therapy and new guidelines o Discussed high intensity statin therapy recommended in DM . Patient self care activities - Over the next 60 days, patient will: o Schedule follow up MD appt o Strive for structured exercise program  Diabetes Lab Results  Component Value Date/Time   HGBA1C 8.9 (H) 05/28/2020 10:41 AM   HGBA1C 8.5 (H) 01/29/2020 11:30 AM   HGBA1C 7.8 (H) 12/04/2018 11:06 AM   HGBA1C 8.1 (H) 05/30/2018 10:25 AM   HGBA1C 7.9 07/13/2016  12:00 AM   . Pharmacist Clinical Goal(s): o Over the next 60 days, patient will work with PharmD and providers to achieve A1c goal <7% . Current regimen:  . Jardiance 25 mg qd . Januvia 50 mg qd . Lispro 25 u ac meal bid .  Soliqua 60 units qhs . Interventions: o Reviewed fill history in computer o Reviewed goal A1c and fasting BG. Encouraged patient to check BG prior to 6AM dose of Lispro. Reviewed onset of activity 5-15 minutes intended for coverage meal efffect on BG . Patient self care activities - Over the next 60 days, patient will: o Check blood sugar 3-4 times daily and in the morning before eating or drinking,after eating, document, and provide at future appointments o Contact provider with any episodes of hypoglycemia  Medication management . Pharmacist Clinical Goal(s): o Over the next 60 days, patient will work with PharmD and providers to achieve optimal medication adherence . Current pharmacy: [facility] . Interventions o Comprehensive medication review performed. o Continue current medication management strategy . Patient self care activities - Over the next 60 days, patient will: o Focus on medication adherence by fill dates o Take medications as prescribed o Report any questions or concerns to PharmD and/or provider(s)  Initial goal documentation        The patient verbalized understanding of instructions provided today and agreed to receive a mailed copy of patient instruction and/or  Scientist, clinical (histocompatibility and immunogenetics).  Telephone follow up appointment with pharmacy team member scheduled for: 2 months  Junita Push. Kenton Kingfisher PharmD, [place] Clinical Pharmacist [phone]

## 2020-10-04 NOTE — Chronic Care Management (AMB) (Signed)
Chronic Care Management Pharmacy  Name: Tami Cummings  MRN: [id number] DOB: [birth year]   Chief Complaint/ HPI  Tami Cummings,  74 y.o. , female presents for their Follow-Up CCM visit with the clinical pharmacist via telephone.  PCP : Volney American, PA-C Patient Care Team: Volney American, PA-C as PCP - General (Family Medicine) Kem Parkinson, MD (Ophthalmology) Vanita Ingles, RN as [facility] ([place]) Vladimir Faster, [facility] as Pharmacist (Pharmacist)  Their chronic conditions include: Hypertension, Hyperlipidemia and Diabetes   Office Visits: 04/30/20- Merrie Roof, PA- blood work  05/25/20- Catie, [name], PharmD, CCM - increase Soliqua to 60 u, check post meal BG, change soliquia to Basal + GLP1 if A1C stays high    Allergies  Allergen Reactions  . Actos [Pioglitazone] Other (See Comments)    CHF  . Amlodipine Swelling    Leg swelling   . Benazepril Other (See Comments)    angioedema  . Hydrochlorothiazide Other (See Comments)    angioedema  . Metformin And Related Diarrhea    Medications: Outpatient Encounter Medications as of 10/04/2020  Medication Sig  . amLODipine (NORVASC) 10 MG tablet Take 1 tablet (10 mg total) by mouth daily.  Marland Kitchen atenolol (TENORMIN) 100 MG tablet Take 1 tablet (100 mg total) by mouth daily. (Patient taking differently: Take 100 mg by mouth daily. Takes in the evening)  . Blood Glucose Monitoring Suppl (ONE TOUCH ULTRA 2) w/Device KIT USE ONCE TO TWICE DAILY  . empagliflozin (JARDIANCE) 25 MG TABS tablet TAKE 1 TABLET BY MOUTH ONCE DAILY BEFORE  BREAKFAST  . glucose blood (ONE TOUCH ULTRA TEST) test strip USE ONE TO TWO STRIPS TO CHECK GLUCOSE ONCE DAILY  . Insulin Glargine-Lixisenatide (SOLIQUA) 100-33 UNT-MCG/ML SOPN Inject 60 Units into the skin daily. Start 30 u increase 2 u every 2 days as needed max 60u a day  . insulin lispro (HUMALOG KWIKPEN) 100 UNIT/ML KwikPen  Inject 0.25 mLs (25 Units total) into the skin 2 (two) times daily before a meal.  . aspirin EC 81 MG tablet Take 81 mg by mouth daily. am  . Insulin Pen Needle (BD PEN NEEDLE MICRO U/F) 32G X 6 MM MISC USE 4 TO 5 DAILY WITH INSULIN USE  . JANUVIA 50 MG tablet Take 1 tablet by mouth once daily  . lovastatin (MEVACOR) 40 MG tablet Take 1 tablet (40 mg total) by mouth daily with breakfast.  . spironolactone (ALDACTONE) 25 MG tablet Take 1 tablet (25 mg total) by mouth daily.   No facility-administered encounter medications on file as of 10/04/2020.    Wt Readings from Last 3 Encounters:  01/29/20 204 lb (92.5 kg)  10/20/19 197 lb (89.4 kg)  07/18/19 199 lb 9.6 oz (90.5 kg)    Current Diagnosis/Assessment:    Goals Addressed            This Visit's Progress   . Pharmacy care plan       CARE PLAN ENTRY (see longitudinal plan of care for additional care plan information)  Current Barriers:  . Chronic Disease Management support, education, and care coordination needs related to Hypertension, Hyperlipidemia, and Diabetes   Hypertension BP Readings from Last 3 Encounters:  04/30/20 (!) 143/80  03/21/20 128/66  01/29/20 (!) 145/73   . Pharmacist Clinical Goal(s): o Over the next 60 days, patient will work with PharmD and providers to achieve BP goal <130/80 . Current regimen:  . Amlodipine 10 mg qd . Atenolol  161m qhs  . Spironolactone 25 mg qd . Interventions: o Provided diet and exercise counseling o Discussed importance of home BP monitorien . Patient self care activities - Over the next 60 days, patient will: o Check BP 3-4 times weekly, document, and provide at future appointments o Ensure daily salt intake < 2300 mg/day o Obtain new BP cuff  Hyperlipidemia Lab Results  Component Value Date/Time   LDLCALC 95 05/28/2020 10:41 AM   . Pharmacist Clinical Goal(s): o Over the next 60 days, patient will work with PharmD and providers to achieve LDL goal <  70 . Current regimen:  o Lovastatin 40 mg qd o Aspirin 81 mg qd  Interventions: o Reviewed risk vs benefit of asa therapy and new guidelines o Discussed high intensity statin therapy recommended in DM . Patient self care activities - Over the next 60 days, patient will: o Schedule follow up MD appt o Strive for structured exercise program  Diabetes Lab Results  Component Value Date/Time   HGBA1C 8.9 (H) 05/28/2020 10:41 AM   HGBA1C 8.5 (H) 01/29/2020 11:30 AM   HGBA1C 7.8 (H) 12/04/2018 11:06 AM   HGBA1C 8.1 (H) 05/30/2018 10:25 AM   HGBA1C 7.9 07/13/2016 12:00 AM   . Pharmacist Clinical Goal(s): o Over the next 60 days, patient will work with PharmD and providers to achieve A1c goal <7% . Current regimen:  . Jardiance 25 mg qd . Januvia 50 mg qd . Lispro 25 u ac meal bid .  Soliqua 60 units qhs . Interventions: o Reviewed fill history in computer o Reviewed goal A1c and fasting BG. Encouraged patient to check BG prior to 6AM dose of Lispro. Reviewed onset of activity 5-15 minutes intended for coverage meal efffect on BG . Patient self care activities - Over the next 60 days, patient will: o Check blood sugar 3-4 times daily and in the morning before eating or drinking,after eating, document, and provide at future appointments o Contact provider with any episodes of hypoglycemia  Medication management . Pharmacist Clinical Goal(s): o Over the next 60 days, patient will work with PharmD and providers to achieve optimal medication adherence . Current pharmacy: [facility]. Interventions o Comprehensive medication review performed. o Continue current medication management strategy . Patient self care activities - Over the next 60 days, patient will: o Focus on medication adherence by fill dates o Take medications as prescribed o Report any questions or concerns to PharmD and/or provider(s)  Initial goal documentation        Diabetes   A1c goal <7%  Recent Relevant  Labs: Lab Results  Component Value Date/Time   HGBA1C 8.9 (H) 05/28/2020 10:41 AM   HGBA1C 8.5 (H) 01/29/2020 11:30 AM   HGBA1C 7.8 (H) 12/04/2018 11:06 AM   HGBA1C 8.1 (H) 05/30/2018 10:25 AM   HGBA1C 7.9 07/13/2016 12:00 AM   MICROALBUR 30 (H) 12/04/2018 11:06 AM   MICROALBUR 150 (H) 10/30/2016 09:26 AM    Last diabetic Eye exam:  Lab Results  Component Value Date/Time   HMDIABEYEEXA No Retinopathy 04/01/2018 12:00 AM    Last diabetic Foot exam: No results found for: HMDIABFOOTEX   Checking BG: 3-4 times weekly  Recent FBG Readings: 140s  Recent pre-meal BG readings: 140s Recent 2hr PP BG readings:  Not checking Recent HS BG readings: Not checking   Patient has failed these meds in past:  Onglyza per chart, metformin -diarrhea and nausea Patient is currently uncontrolled on the following medications: . Jardiance 25  mg qd . Januvia 50 mg qd . Lispro 25 u ac meal bid .  Soliqua 60 units qhs  We discussed: diet and exercise extensively. Patient lives with daughter and 2 gradchildren, 64 and 71 years old. 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 Reports daughter cooks dinner nightly. Patient reports irregular dietary habits sometimes eating cereal for breakfast and sometimes eating nothing until dinner. She does not check her BG consistently. She reports taking her lispro at 6AM each morning without checking BG before and sometimes doesn't eat after. She denies any symptoms of hypoglycemia or missed doses. Reviewed last appointment notes with [name] indicating follow up in November. And encouraged patient to call and schedule after our call. Encouraged patient to check post meal to help guide humalog therapy.   Plan  If A1C remains elevated consider  Discontinuing Soliqua and splitting into separate basal product and GLP-1. D/C Januvia due to duplicate MOA.   Hypertension   BP goal is:  <130/80  Office blood pressures are  BP Readings from Last 3 Encounters:  04/30/20 (!)  143/80  03/21/20 128/66  01/29/20 (!) 145/73   BMP Latest Ref Rng & Units 05/28/2020 10/20/2019 07/18/2019  Glucose 65 - 99 mg/dL 164(H) 137(H) 111(H)  BUN 8 - 27 mg/dL _0 Creatinine 0.57 - 1.00 mg/dL 1.07(H) 0.87 0.96  BUN/Creat Ratio 12 - _1 Sodium 134 - 144 mmol/L 137 141 142  Potassium 3.5 - 5.2 mmol/L 4.3 4.2 4.6  Chloride 96 - 106 mmol/L 102 104 106  CO2 20 - 29 mmol/L _2 Calcium 8.7 - 10.3 mg/dL 9.6 9.6 9.3    Patient checks BP at home hasn' t checked in ~ 1 month   Patient has failed these meds in the past: NA Patient is currentlyquery  controlled on the following medications:  . Amlodipine 10 mg qd . Atenolol 15m qhs  . Spironolactone 25 mg qd  We discussed diet and exercise. Patient states she doesn't use much salt but occasionally adds to food "for flavor."  She reports her BP cuff hasn't been working so she hasn't been checking. She is familiar with her [employer] benefits and will use to obtain a new meter this week. Does not recall ever being on an ACE- I or ARB.  Plan  Consider addition of ACE-I or ARB    Hyperlipidemia   LDL goal < 70  Lipid Panel     Component Value Date/Time   CHOL 150 05/28/2020 1041   CHOL 137 12/04/2018 1106   TRIG 108 05/28/2020 1041   TRIG 262 (H) 12/04/2018 1106   HDL 35 (L) 05/28/2020 1041   LDLCALC 95 05/28/2020 1041    Hepatic Function Latest Ref Rng & Units 05/28/2020 10/20/2019 07/18/2019  Total Protein 6.0 - 8.5 g/dL 6.5 6.8 6.6  Albumin 3.7 - 4.7 g/dL 4.2 4.4 4.2  AST 0 - 40 IU/L _3 ALT 0 - 32 IU/L _4 Alk Phosphatase 48 - 121 IU/L 87 98 83  Total Bilirubin 0.0 - 1.2 mg/dL <0.2 0.2 0.2     The 10-year ASCVD risk score (Mikey BussingDC Jr., et al., 2013) is: 36.1%   Values used to calculate the score:     Age: [age]years     Sex: Female     Is Non-Hispanic African American: No     Diabetic: Yes     Tobacco smoker: No  Systolic Blood Pressure: 149 mmHg     Is BP treated: Yes     HDL  Cholesterol: 35 mg/dL     Total Cholesterol: 150 mg/dL   Patient has failed these meds in past: NA Patient is currently uncontrolled on the following medications:   Lovastatin 40 mg qd  Aspirin 81 mg qd  We discussed:  Aspirin no longer recommended for primary prevention in patients > 70. High intensity statin for patients with DM.  Plan  Consider changing lovastatin to rosuvastatin 20 mg. Consider d/c aspirin for primary prevention given age >90.    Medication Management   Pt uses [facility] for all medications Uses pill box?  Pt endorses  compliance  Will discuss pharmacy at next visit.  Plan  Continue current medication management strategy    Follow up: 2  month phone visit  Junita Push. Kenton Kingfisher PharmD, [facility] [phone]

## 2020-10-06 ENCOUNTER — Telehealth: Payer: Self-pay | Admitting: General Practice

## 2020-10-06 ENCOUNTER — Ambulatory Visit (INDEPENDENT_AMBULATORY_CARE_PROVIDER_SITE_OTHER): Payer: Medicare Other | Admitting: General Practice

## 2020-10-06 DIAGNOSIS — E782 Mixed hyperlipidemia: Secondary | ICD-10-CM | POA: Diagnosis not present

## 2020-10-06 DIAGNOSIS — I10 Essential (primary) hypertension: Secondary | ICD-10-CM | POA: Diagnosis not present

## 2020-10-06 DIAGNOSIS — E1142 Type 2 diabetes mellitus with diabetic polyneuropathy: Secondary | ICD-10-CM | POA: Diagnosis not present

## 2020-10-06 NOTE — Patient Instructions (Signed)
Visit Information  Goals Addressed            This Visit's Progress   . COMPLETED: RNCM: I am scared to take that vaccine       Current Barriers: Goal completed. The patient has taken both vaccinations.  No new concerns related to [place].  The patient is practicing mandates and staying home unless she needs to go out.  . Knowledge Deficits related to COVID-19 and impact on patient self health management  Clinical Goal(s): ID . Over the next 30 days, patient will verbalize basic understanding of COVID-19 impact on individual health and self health management as evidenced by verbalization of basic understanding of COVID-19 as a viral disease, measures to prevent exposure, signs and symptoms, when to contact provider  Interventions: . Provided education to patient re: benefits of the COVID19 vaccine in patients with chronic health conditions . Collaborated with PCP and pharmacist regarding education and assistance with helping the patient understand the negative impact COVID19 can have on patients with chronic medical conditions- patient verbalized she is going to take the COVID19 vaccine today- this will be the first vaccine. . Discussed plans with patient for ongoing care management follow up and provided patient with direct contact information for care management team . Pharmacy referral for assistance with education and support related to COVID19 vaccine- completed- patient is going today to get the first shot for [place]  Patient Self Care Activities:  . Patient verbalizes understanding of plan to talk with pcp and pharmacist concerning the COVID 19 vaccine and her reservations in getting the vaccination . Attends all scheduled provider appointments . Calls provider office for new concerns or questions  Please see past updates related to this goal by clicking on the "Past Updates" button in the selected goal   For information about COVID-19 or "Corona Virus", the following web  resources may be helpful:  CDC: BeginnerSteps.be    [place]:  InsuranceIntern.se                    . RNCM: I don't take my blood pressure every day and I take my sugars when I think about it       Current Barriers:  . Chronic Disease Management support, education, and care coordination needs related to HTN, HLD, and DMII  Clinical Goal(s) related to HTN, HLD, and DMII:  Over the next 120 days, patient will:  . Work with the care management team to address educational, disease management, and care coordination needs  . Begin or continue self health monitoring activities as directed today Measure and record cbg (blood glucose) BID times daily and Measure and record blood pressure 4 times per week, work toward keeping a log of readings, and work toward increasing activity level . Call provider office for new or worsened signs and symptoms Blood glucose findings outside established parameters, Blood pressure findings outside established parameters, and New or worsened symptom related to HTN, Dm2, and HLD . Call care management team with questions or concerns . Verbalize basic understanding of patient centered plan of care established today  Interventions related to HTN, HLD, and DMII:  . Evaluation of current treatment plans and patient's adherence to plan as established by provider.  The patient endorses compliance with medications and plan of care. 10-06-2020: The patient feels she is doing well with her health and well being. Will see the pcp next week for follow up.  . Assessed patient understanding of disease states.  The patient  has a good understanding of her chronic conditions.  The patient endorses checking blood sugars. 10-06-2020: The patient had not taken her blood sugar this am but states her range has been 130-140, sometime higher and  sometime lower.  Review of how often the patient is taking blood sugars and she states she tries to everyday; however with caring for her grandchildren she can not always do this.  Encouraged the patient to take blood sugars and recording, having list available at upcoming appointment on 10-15-2020.  The patient denies any episodes of hypoglycemia.  . Assessed patient's education and care coordination needs.  Education on the benefits of a heart healthy/ADA diet. The patient states she only adds salt to some of her food. The patient denies using a lot of sodium. The patient states that her weakness is "sweets".  Education on Fiber one options for satisfying her sweet cravings. The patient will check into this. Education and support given. 10-06-2020: the patient states she is trying to be more mindful of her diet. She does not eat a lot. The patient is enjoying some of the fruits and vegetables, she eats a lot of "country food". Education on portion control, decreased sugars, sodium and fats. The patient denies any issues related to dietary restrictions.  . Provided disease specific education to patient: Educated on the benefits of monitoring blood sugars as prescribed and taking blood pressure weekly. The patient states she gets busy and forgets.  10-06-2020: The patient states she has not been taking her blood pressure a lot but when she does it is really good. The patient denies any headache or other issues related to elevated blood pressures.  Nash Dimmer with appropriate clinical care team members regarding patient needs.  The patient currently works with the CCM pharmacist as needed and knows the LCSW is available for resources.  . Evaluation of the patients compliance of writing down blood sugars and blood pressures on the log provided by the pharmacist in January 2021. Encouraged the patient to start using the log daily. The patient is now writing her blood pressure down.  The patient says she is  still not taking her blood sugars daily. She had not taken this am but yesterday am was 151. She forgets as she is busy taking care of her grandchildren. Education on the importance of taking her blood sugars daily BID as prescribed and recording.  Patient states that she has 2 grandchildren that keep her busy. 10-06-2020: the patient is not writing down her readings but can give the RNCM some past readings, the patient has to be reminded frequently on outreaches to check blood sugars, blood pressures, and increase activity to help with management of her chronic conditions. The patient stays busy with her grandchildren and forgets a lot of times to take readings.  Education on care of self so she can continue to care for her grandchildren.  The patient states she feels well and is happy. Reminders during outreaches to take as recommended and write down.  . Evaluation of the patients dietary habits. The patients daughter cooks meals for the patient. The patient states "I don't use much salt".  The member does eat fried foods and processed foods at times, education on Heart Healthy/ADA diet . Discussed working toward a structured exercise plan.  The patient states she has a "bad knee" and doesn't get out much. 10-06-2020: does take her granddaughters outside and walks and plays with them in the yard. Can't do a lot due to  her "bad leg". Encouraged pacing activity.  . Evaluation of medication compliance with Januvia.  The patient endorses compliance with Januvia.  10-06-2020: Review of medications and any new concerns. The patient states that she is not having issues with her medications.  . Evaluation of upcoming appointments: Next appointment with the pcp is 10-15-2020 at 2:20 pm.    Patient Self Care Activities related to HTN, HLD, and DMII:  . Patient is unable to independently self-manage chronic health conditions  Please see past updates related to this goal by clicking on the "Past Updates" button in  the selected goal         Patient verbalizes understanding of instructions provided today.   Telephone follow up appointment with care management team member scheduled for: 12-08-2020 at [place] am  Noreene Larsson RN, MSN, [facility] Mobile: [phone]

## 2020-10-06 NOTE — Chronic Care Management (AMB) (Signed)
Chronic Care Management   Follow Up Note   10/06/2020 Name: Peightyn Roberson MRN: [id number] DOB: Aug 02, 1946  Referred by: Volney American, PA-C Reason for referral : Chronic Care Management (RNCM Follow up call for Chronic Disease Management and Care Coordination Needs)   Suanne Minahan Buell is a 74 y.o. year old female who is a primary care patient of Volney American, [place]. The CCM team was consulted for assistance with chronic disease management and care coordination needs.    Review of patient status, including review of consultants reports, relevant laboratory and other test results, and collaboration with appropriate care team members and the patient's provider was performed as part of comprehensive patient evaluation and provision of chronic care management services.    SDOH (Social Determinants of Health) assessments performed: Yes See Care Plan activities for detailed interventions related to [facility])     Outpatient Encounter Medications as of 10/06/2020  Medication Sig  . amLODipine (NORVASC) 10 MG tablet Take 1 tablet (10 mg total) by mouth daily.  Marland Kitchen aspirin EC 81 MG tablet Take 81 mg by mouth daily. am  . atenolol (TENORMIN) 100 MG tablet Take 1 tablet (100 mg total) by mouth daily. (Patient taking differently: Take 100 mg by mouth daily. Takes in the evening)  . Blood Glucose Monitoring Suppl (ONE TOUCH ULTRA 2) w/Device KIT USE ONCE TO TWICE DAILY  . empagliflozin (JARDIANCE) 25 MG TABS tablet TAKE 1 TABLET BY MOUTH ONCE DAILY BEFORE  BREAKFAST  . glucose blood (ONE TOUCH ULTRA TEST) test strip USE ONE TO TWO STRIPS TO CHECK GLUCOSE ONCE DAILY  . Insulin Glargine-Lixisenatide (SOLIQUA) 100-33 UNT-MCG/ML SOPN Inject 60 Units into the skin daily. Start 30 u increase 2 u every 2 days as needed max 60u a day  . insulin lispro (HUMALOG KWIKPEN) 100 UNIT/ML KwikPen Inject 0.25 mLs (25 Units total) into the skin 2 (two) times daily before a meal.  . Insulin Pen  Needle (BD PEN NEEDLE MICRO U/F) 32G X 6 MM MISC USE 4 TO 5 DAILY WITH INSULIN USE  . JANUVIA 50 MG tablet Take 1 tablet by mouth once daily  . lovastatin (MEVACOR) 40 MG tablet Take 1 tablet (40 mg total) by mouth daily with breakfast.  . spironolactone (ALDACTONE) 25 MG tablet Take 1 tablet (25 mg total) by mouth daily.   No facility-administered encounter medications on file as of 10/06/2020.     Objective:  BP Readings from Last 3 Encounters:  04/30/20 (!) 143/80  03/21/20 128/66  01/29/20 (!) 145/73   Lab Results  Component Value Date   HGBA1C 8.9 (H) 05/28/2020    Goals Addressed            This Visit's Progress   . COMPLETED: RNCM: I am scared to take that vaccine       Current Barriers: Goal completed. The patient has taken both vaccinations.  No new concerns related to [place].  The patient is practicing mandates and staying home unless she needs to go out.  . Knowledge Deficits related to COVID-19 and impact on patient self health management  Clinical Goal(s): ID . Over the next 30 days, patient will verbalize basic understanding of COVID-19 impact on individual health and self health management as evidenced by verbalization of basic understanding of COVID-19 as a viral disease, measures to prevent exposure, signs and symptoms, when to contact provider  Interventions: . Provided education to patient re: benefits of the COVID19 vaccine in patients with chronic health conditions .  Collaborated with PCP and pharmacist regarding education and assistance with helping the patient understand the negative impact COVID19 can have on patients with chronic medical conditions- patient verbalized she is going to take the COVID19 vaccine today- this will be the first vaccine. . Discussed plans with patient for ongoing care management follow up and provided patient with direct contact information for care management team . Pharmacy referral for assistance with education and support  related to COVID19 vaccine- completed- patient is going today to get the first shot for [place]  Patient Self Care Activities:  . Patient verbalizes understanding of plan to talk with pcp and pharmacist concerning the COVID 19 vaccine and her reservations in getting the vaccination . Attends all scheduled provider appointments . Calls provider office for new concerns or questions  Please see past updates related to this goal by clicking on the "Past Updates" button in the selected goal   For information about COVID-19 or "Corona Virus", the following web resources may be helpful:  CDC: BeginnerSteps.be    [place]:  InsuranceIntern.se                    . RNCM: I don't take my blood pressure every day and I take my sugars when I think about it       Current Barriers:  . Chronic Disease Management support, education, and care coordination needs related to HTN, HLD, and DMII  Clinical Goal(s) related to HTN, HLD, and DMII:  Over the next 120 days, patient will:  . Work with the care management team to address educational, disease management, and care coordination needs  . Begin or continue self health monitoring activities as directed today Measure and record cbg (blood glucose) BID times daily and Measure and record blood pressure 4 times per week, work toward keeping a log of readings, and work toward increasing activity level . Call provider office for new or worsened signs and symptoms Blood glucose findings outside established parameters, Blood pressure findings outside established parameters, and New or worsened symptom related to HTN, Dm2, and HLD . Call care management team with questions or concerns . Verbalize basic understanding of patient centered plan of care established today  Interventions related to HTN, HLD, and DMII:   . Evaluation of current treatment plans and patient's adherence to plan as established by provider.  The patient endorses compliance with medications and plan of care. 10-06-2020: The patient feels she is doing well with her health and well being. Will see the pcp next week for follow up.  . Assessed patient understanding of disease states.  The patient has a good understanding of her chronic conditions.  The patient endorses checking blood sugars. 10-06-2020: The patient had not taken her blood sugar this am but states her range has been 130-140, sometime higher and sometime lower.  Review of how often the patient is taking blood sugars and she states she tries to everyday; however with caring for her grandchildren she can not always do this.  Encouraged the patient to take blood sugars and recording, having list available at upcoming appointment on 10-15-2020.  The patient denies any episodes of hypoglycemia.  . Assessed patient's education and care coordination needs.  Education on the benefits of a heart healthy/ADA diet. The patient states she only adds salt to some of her food. The patient denies using a lot of sodium. The patient states that her weakness is "sweets".  Education on Fiber one options for satisfying her sweet  cravings. The patient will check into this. Education and support given. 10-06-2020: the patient states she is trying to be more mindful of her diet. She does not eat a lot. The patient is enjoying some of the fruits and vegetables, she eats a lot of "country food". Education on portion control, decreased sugars, sodium and fats. The patient denies any issues related to dietary restrictions.  . Provided disease specific education to patient: Educated on the benefits of monitoring blood sugars as prescribed and taking blood pressure weekly. The patient states she gets busy and forgets.  10-06-2020: The patient states she has not been taking her blood pressure a lot but when she does it  is really good. The patient denies any headache or other issues related to elevated blood pressures.  Nash Dimmer with appropriate clinical care team members regarding patient needs.  The patient currently works with the CCM pharmacist as needed and knows the LCSW is available for resources.  . Evaluation of the patients compliance of writing down blood sugars and blood pressures on the log provided by the pharmacist in January 2021. Encouraged the patient to start using the log daily. The patient is now writing her blood pressure down.  The patient says she is still not taking her blood sugars daily. She had not taken this am but yesterday am was 151. She forgets as she is busy taking care of her grandchildren. Education on the importance of taking her blood sugars daily BID as prescribed and recording.  Patient states that she has 2 grandchildren that keep her busy. 10-06-2020: the patient is not writing down her readings but can give the RNCM some past readings, the patient has to be reminded frequently on outreaches to check blood sugars, blood pressures, and increase activity to help with management of her chronic conditions. The patient stays busy with her grandchildren and forgets a lot of times to take readings.  Education on care of self so she can continue to care for her grandchildren.  The patient states she feels well and is happy. Reminders during outreaches to take as recommended and write down.  . Evaluation of the patients dietary habits. The patients daughter cooks meals for the patient. The patient states "I don't use much salt".  The member does eat fried foods and processed foods at times, education on Heart Healthy/ADA diet . Discussed working toward a structured exercise plan.  The patient states she has a "bad knee" and doesn't get out much. 10-06-2020: does take her granddaughters outside and walks and plays with them in the yard. Can't do a lot due to her "bad leg". Encouraged pacing  activity.  . Evaluation of medication compliance with Januvia.  The patient endorses compliance with Januvia.  10-06-2020: Review of medications and any new concerns. The patient states that she is not having issues with her medications.  . Evaluation of upcoming appointments: Next appointment with the pcp is 10-15-2020 at 2:20 pm.    Patient Self Care Activities related to HTN, HLD, and DMII:  . Patient is unable to independently self-manage chronic health conditions  Please see past updates related to this goal by clicking on the "Past Updates" button in the selected goal          Plan:   Telephone follow up appointment with care management team member scheduled for:  12-08-2020 at [place] am   [place], MSN, [facility] Mobile: [phone]

## 2020-10-08 ENCOUNTER — Encounter: Payer: Self-pay | Admitting: Nurse Practitioner

## 2020-10-08 DIAGNOSIS — N183 Chronic kidney disease, stage 3 unspecified: Secondary | ICD-10-CM | POA: Insufficient documentation

## 2020-10-15 ENCOUNTER — Encounter: Payer: Self-pay | Admitting: Nurse Practitioner

## 2020-10-15 ENCOUNTER — Ambulatory Visit (INDEPENDENT_AMBULATORY_CARE_PROVIDER_SITE_OTHER): Payer: Medicare Other | Admitting: Nurse Practitioner

## 2020-10-15 ENCOUNTER — Telehealth: Payer: Self-pay

## 2020-10-15 ENCOUNTER — Other Ambulatory Visit: Payer: Self-pay

## 2020-10-15 VITALS — BP 117/74 | HR 73 | Temp 97.6°F | Resp 15 | Wt 205.6 lb

## 2020-10-15 DIAGNOSIS — E1169 Type 2 diabetes mellitus with other specified complication: Secondary | ICD-10-CM | POA: Diagnosis not present

## 2020-10-15 DIAGNOSIS — I152 Hypertension secondary to endocrine disorders: Secondary | ICD-10-CM

## 2020-10-15 DIAGNOSIS — E538 Deficiency of other specified B group vitamins: Secondary | ICD-10-CM

## 2020-10-15 DIAGNOSIS — E559 Vitamin D deficiency, unspecified: Secondary | ICD-10-CM

## 2020-10-15 DIAGNOSIS — E1142 Type 2 diabetes mellitus with diabetic polyneuropathy: Secondary | ICD-10-CM

## 2020-10-15 DIAGNOSIS — E1159 Type 2 diabetes mellitus with other circulatory complications: Secondary | ICD-10-CM | POA: Diagnosis not present

## 2020-10-15 DIAGNOSIS — N1831 Chronic kidney disease, stage 3a: Secondary | ICD-10-CM

## 2020-10-15 DIAGNOSIS — E785 Hyperlipidemia, unspecified: Secondary | ICD-10-CM

## 2020-10-15 DIAGNOSIS — D692 Other nonthrombocytopenic purpura: Secondary | ICD-10-CM

## 2020-10-15 NOTE — Patient Instructions (Signed)

## 2020-10-15 NOTE — Assessment & Plan Note (Signed)
Chronic, ongoing.  BP at goal today.  Continue current medication regimen and adjust as needed.  Would benefit from ACE or ARB, but has history of angioedema with Benazepril.  Recommend she monitor BP at home a few days a week and document + focus on DASH diet at home.  Obtain BMP and TSH today.  Return to office in 3 months.

## 2020-10-15 NOTE — Assessment & Plan Note (Signed)
Noted on exam, takes ASA daily.  Recommend gentle skin cleansing at home and monitor for skin breakdown, if present alert provider immediately.  Obtain CBC annually.

## 2020-10-15 NOTE — Assessment & Plan Note (Signed)
Chronic, ongoing.  Last A1C 8.9% in June.  Will obtain A1C today and adjust regimen as needed based on this.  At this time continue current medication regimen until labs returned.  History of allergy to Actos and Metformin.  If elevation in A1C ongoing may need to consider referral to endocrinology for further guidance.  Obtain urine ALB today.  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  Recommend she continue to monitor BS at home TID and document for visits.  Return in 3 months, sooner if medication changes made.

## 2020-10-15 NOTE — Progress Notes (Signed)
BP 117/74   Pulse 73   Temp 97.6 F (36.4 C) (Oral)   Resp 15   Wt 205 lb 9.6 oz (93.3 kg)   BMI 39.49 kg/m    Subjective:    [name], female    DOB: 23-Sep-1946, 74 y.o.   MRN: [id number]  HPI: Tami Cummings is a 74 y.o. female  Chief Complaint  Patient presents with  . Diabetes    Patient returns back to office for 5 month follow up, at last office visit patient encouraged to work on diet and continue Jardiance. Patient reports good compliance and tolerance on medication and denies side effects. Patient denies symptoms of polyuria, polydipsia,  visual changes, weight loss. Patient states that average blood sugar reading at home was 140-170.  Marland Kitchen Hypertension    Patient returns for 5 month follow up visit, at last visit blood pressure in office was 143/80 patient advised to continue current medication regimen and to use DASH diet. Patient reports good compliance on medication. Patient does check her blood pressure and states that systolic reading is between 275-170 with a diastolic of 01V  . Hyperlipidemia    Patient returns for 5 month follow up at last visit she was instructed to continue current regimen.    DIABETES Last A1C 8.9%.  Currently taking Januvia, Jardiance, Soliqua (50 units), Humalog 25 units before meals.  In past Actos caused issues and Metformin caused diarrhea. Hypoglycemic episodes: occasional Polydipsia/polyuria: no Visual disturbance: no Chest pain: no Paresthesias: no Glucose Monitoring: yes  Accucheck frequency: BID  Fasting glucose: 140's range  Post prandial:  Evening: 130 - 170 range  Before meals: Taking Insulin?: yes  Long acting insulin: 50 units Soliqua  Short acting insulin: 25 units Humalog Blood Pressure Monitoring: rarely Retinal Examination: Not up to Date Foot Exam: Up to Date Pneumovax: Up to Date Influenza: Up to Date Aspirin: yes   HYPERTENSION / HYPERLIPIDEMIA Currently taking Amlodipine,  Spironolactone, Atenolol & Lovastatin.  In past Benazepril -- angioedema.   Satisfied with current treatment? yes Duration of hypertension: chronic BP monitoring frequency: not checking BP range:  BP medication side effects: no Duration of hyperlipidemia: chronic Cholesterol medication side effects: no Cholesterol supplements: none Medication compliance: good compliance Aspirin: yes Recent stressors: no Recurrent headaches: no Visual changes: no Palpitations: no Dyspnea: no Chest pain: no Lower extremity edema: no Dizzy/lightheaded: no  Relevant past medical, surgical, family and social history reviewed and updated as indicated. Interim medical history since our last visit reviewed. Allergies and medications reviewed and updated.  Review of Systems  Constitutional: Negative for activity change, appetite change, diaphoresis, fatigue and fever.  Respiratory: Negative for cough, chest tightness and shortness of breath.   Cardiovascular: Negative for chest pain, palpitations and leg swelling.  Gastrointestinal: Negative.   Endocrine: Negative for cold intolerance, heat intolerance, polydipsia, polyphagia and polyuria.  Neurological: Negative.   Psychiatric/Behavioral: Negative.     Per HPI unless specifically indicated above     Objective:    BP 117/74   Pulse 73   Temp 97.6 F (36.4 C) (Oral)   Resp 15   Wt 205 lb 9.6 oz (93.3 kg)   BMI 39.49 kg/m   Wt Readings from Last 3 Encounters:  10/15/20 205 lb 9.6 oz (93.3 kg)  01/29/20 204 lb (92.5 kg)  10/20/19 197 lb (89.4 kg)    Physical Exam Vitals and nursing note reviewed.  Constitutional:      General: She is awake.  She is not in acute distress.    Appearance: She is well-developed and well-groomed. She is morbidly obese. She is not ill-appearing.  HENT:     Head: Normocephalic.     Right Ear: Hearing normal.     Left Ear: Hearing normal.  Eyes:     General: Lids are normal.        Right eye: No discharge.         Left eye: No discharge.     Conjunctiva/sclera: Conjunctivae normal.     Pupils: Pupils are equal, round, and reactive to light.  Neck:     Thyroid: No thyromegaly.     Vascular: No carotid bruit.  Cardiovascular:     Rate and Rhythm: Normal rate and regular rhythm.     Heart sounds: Normal heart sounds. No murmur heard.  No gallop.   Pulmonary:     Effort: Pulmonary effort is normal. No accessory muscle usage or respiratory distress.     Breath sounds: Normal breath sounds.  Abdominal:     General: Bowel sounds are normal.     Palpations: Abdomen is soft.  Musculoskeletal:     Cervical back: Normal range of motion and neck supple.     Right lower leg: No edema.     Left lower leg: No edema.  Skin:    General: Skin is warm and dry.     Comments: Scattered pale bruises bilateral upper extremities.  Neurological:     Mental Status: She is alert and oriented to person, place, and time.  Psychiatric:        Attention and Perception: Attention normal.        Mood and Affect: Mood normal.        Speech: Speech normal.        Behavior: Behavior normal. Behavior is cooperative.        Thought Content: Thought content normal.    Diabetic Foot Exam - Simple   Simple Foot Form Visual Inspection See comments: Yes Sensation Testing Intact to touch and monofilament testing bilaterally: Yes Pulse Check Posterior Tibialis and Dorsalis pulse intact bilaterally: Yes Comments Xerosis bilateral feet.      Results for orders placed or performed in visit on 05/28/20  HgB A1c  Result Value Ref Range   Hgb A1c MFr Bld 8.9 (H) 4.8 - 5.6 %   Est. average glucose Bld gHb Est-mCnc 209 mg/dL  Lipid Panel w/o Chol/HDL Ratio  Result Value Ref Range   Cholesterol, Total 150 100 - 199 mg/dL   Triglycerides 108 0 - 149 mg/dL   HDL 35 (L) >39 mg/dL   VLDL Cholesterol Cal 20 5 - 40 mg/dL   LDL Chol Calc (NIH) 95 0 - 99 mg/dL  Comprehensive metabolic panel  Result Value Ref Range   Glucose  164 (H) 65 - 99 mg/dL   BUN 15 8 - 27 mg/dL   Creatinine, Ser 1.07 (H) 0.57 - 1.00 mg/dL   GFR calc non Af Amer 52 (L) >59 mL/min/1.73   GFR calc Af Amer 60 >59 mL/min/1.73   BUN/Creatinine Ratio 14 12 - 28   Sodium 137 134 - 144 mmol/L   Potassium 4.3 3.5 - 5.2 mmol/L   Chloride 102 96 - 106 mmol/L   CO2 20 20 - 29 mmol/L   Calcium 9.6 8.7 - 10.3 mg/dL   Total Protein 6.5 6.0 - 8.5 g/dL   Albumin 4.2 3.7 - 4.7 g/dL   Globulin, Total 2.3 1.5 - 4.5  g/dL   Albumin/Globulin Ratio 1.8 1.2 - 2.2   Bilirubin Total <0.2 0.0 - 1.2 mg/dL   Alkaline Phosphatase 87 48 - 121 IU/L   AST 15 0 - 40 IU/L   ALT 20 0 - 32 IU/L      Assessment & Plan:   Problem List Items Addressed This Visit      Cardiovascular and Mediastinum   Hypertension associated with diabetes (HCC)    Chronic, ongoing.  BP at goal today.  Continue current medication regimen and adjust as needed.  Would benefit from ACE or ARB, but has history of angioedema with Benazepril.  Recommend she monitor BP at home a few days a week and document + focus on DASH diet at home.  Obtain BMP and TSH today.  Return to office in 3 months.         Relevant Orders   TSH   Microalbumin, Urine Waived   Basic metabolic panel   Senile purpura ([place])    Noted on exam, takes ASA daily.  Recommend gentle skin cleansing at home and monitor for skin breakdown, if present alert provider immediately.  Obtain CBC annually.        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  Last A1C 8.9% in June.  Will obtain A1C today and adjust regimen as needed based on this.  At this time continue current medication regimen until labs returned.  History of allergy to Actos and Metformin.  If elevation in A1C ongoing may need to consider referral to endocrinology for further guidance.  Obtain urine ALB today.  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  Recommend she continue to monitor BS at home TID  and document for visits.  Return in 3 months, sooner if medication changes made.      Relevant Orders   Microalbumin, Urine Waived   HgB A1c   Hyperlipidemia associated with type 2 diabetes mellitus (HCC)    Chronic, ongoing.  Continue current medication regimen and adjust as needed.  Lipid panel today.         Relevant Orders   Lipid Panel w/o Chol/HDL Ratio     Genitourinary   CKD (chronic kidney disease) stage 3, GFR 30-59 ml/min (HCC)    GFR 52 on June labs.  Recheck BMP and urine ALB today, would benefit from ACE or ARB with her diabetes, but has history of angioedema with Benazepril.  Consider nephrology in future if decline in function.         Other   Morbid obesity (HCC)    BMI 39.49 with T2DM, HTN/HLD.  Recommended eating smaller high protein, low fat meals more frequently and exercising 30 mins a day 5 times a week with a goal of 10-15lb weight loss in the next 3 months. Patient voiced their understanding and motivation to adhere to these recommendations.        Other Visit Diagnoses    Vitamin D deficiency       Reports history of low levels, recheck today and initiate supplement as needed.   Relevant Orders   VITAMIN D 25 Hydroxy (Vit-D Deficiency, Fractures)   Vitamin B12 deficiency       Reports history of low levels, recheck today and initiate supplement as needed.   Relevant Orders   Vitamin B12       Follow up plan: Return in about 3 months (around 01/15/2021) for T2DM,  HTN/hLD  -- meet new PCP.

## 2020-10-15 NOTE — Assessment & Plan Note (Signed)
Chronic, ongoing.  Continue current medication regimen and adjust as needed. Lipid panel today. 

## 2020-10-15 NOTE — Assessment & Plan Note (Signed)
GFR 52 on June labs.  Recheck BMP and urine ALB today, would benefit from ACE or ARB with her diabetes, but has history of angioedema with Benazepril.  Consider nephrology in future if decline in function.

## 2020-10-15 NOTE — Assessment & Plan Note (Signed)
BMI 39.49 with T2DM, HTN/HLD.  Recommended eating smaller high protein, low fat meals more frequently and exercising 30 mins a day 5 times a week with a goal of 10-15lb weight loss in the next 3 months. Patient voiced their understanding and motivation to adhere to these recommendations.

## 2020-10-16 LAB — LIPID PANEL W/O CHOL/HDL RATIO
Cholesterol, Total: 124 mg/dL (ref 100–199)
HDL: 35 mg/dL — ABNORMAL LOW (ref >39)
LDL Chol Calc (NIH): 61 mg/dL (ref 0–99)
Triglycerides: 161 mg/dL — ABNORMAL HIGH (ref 0–149)
VLDL Cholesterol Cal: 28 mg/dL (ref 5–40)

## 2020-10-16 LAB — HEMOGLOBIN A1C
Est. average glucose Bld gHb Est-mCnc: 214 mg/dL
Hgb A1c MFr Bld: 9.1 % — ABNORMAL HIGH (ref 4.8–5.6)

## 2020-10-16 LAB — BASIC METABOLIC PANEL
BUN/Creatinine Ratio: 21 (ref 12–28)
BUN: 20 mg/dL (ref 8–27)
CO2: 22 mmol/L (ref 20–29)
Calcium: 9.3 mg/dL (ref 8.7–10.3)
Chloride: 103 mmol/L (ref 96–106)
Creatinine, Ser: 0.95 mg/dL (ref 0.57–1.00)
GFR calc Af Amer: 69 mL/min/{1.73_m2} (ref >59)
GFR calc non Af Amer: 60 mL/min/{1.73_m2} (ref >59)
Glucose: 157 mg/dL — ABNORMAL HIGH (ref 65–99)
Potassium: 4.7 mmol/L (ref 3.5–5.2)
Sodium: 139 mmol/L (ref 134–144)

## 2020-10-16 LAB — VITAMIN B12: Vitamin B-12: 369 pg/mL (ref 232–1245)

## 2020-10-16 LAB — VITAMIN D 25 HYDROXY (VIT D DEFICIENCY, FRACTURES): Vit D, 25-Hydroxy: 17.9 ng/mL — ABNORMAL LOW (ref 30.0–100.0)

## 2020-10-16 LAB — TSH: TSH: 2.23 u[IU]/mL (ref 0.450–4.500)

## 2020-10-18 ENCOUNTER — Encounter: Payer: Self-pay | Admitting: Nurse Practitioner

## 2020-10-18 ENCOUNTER — Other Ambulatory Visit: Payer: Self-pay | Admitting: Nurse Practitioner

## 2020-10-18 DIAGNOSIS — E559 Vitamin D deficiency, unspecified: Secondary | ICD-10-CM | POA: Insufficient documentation

## 2020-10-18 DIAGNOSIS — E538 Deficiency of other specified B group vitamins: Secondary | ICD-10-CM | POA: Insufficient documentation

## 2020-10-18 LAB — MICROALBUMIN, URINE WAIVED
Creatinine, Urine Waived: 10 mg/dL (ref 10–300)
Microalb, Ur Waived: 10 mg/L (ref 0–19)
Microalb/Creat Ratio: <30 mg/g (ref <30)

## 2020-10-18 MED ORDER — VITAMIN B-12 1000 MCG PO TABS: 1000.0000 ug | ORAL_TABLET | Freq: Every day | ORAL | 4 refills | Status: DC

## 2020-10-18 MED ORDER — CHOLECALCIFEROL 1.25 MG (50000 UT) PO TABS: 1.0000 | ORAL_TABLET | ORAL | 0 refills | Status: DC

## 2020-10-27 ENCOUNTER — Other Ambulatory Visit: Payer: Self-pay | Admitting: Family Medicine

## 2020-11-05 ENCOUNTER — Encounter: Payer: Medicare PPO | Admitting: Family Medicine

## 2020-12-06 ENCOUNTER — Ambulatory Visit: Payer: Self-pay

## 2020-12-08 ENCOUNTER — Ambulatory Visit: Payer: Self-pay | Admitting: General Practice

## 2020-12-08 ENCOUNTER — Telehealth: Payer: Self-pay | Admitting: General Practice

## 2020-12-08 DIAGNOSIS — E785 Hyperlipidemia, unspecified: Secondary | ICD-10-CM

## 2020-12-08 DIAGNOSIS — E1142 Type 2 diabetes mellitus with diabetic polyneuropathy: Secondary | ICD-10-CM

## 2020-12-08 DIAGNOSIS — E1159 Type 2 diabetes mellitus with other circulatory complications: Secondary | ICD-10-CM

## 2020-12-08 DIAGNOSIS — E1169 Type 2 diabetes mellitus with other specified complication: Secondary | ICD-10-CM

## 2020-12-08 NOTE — Chronic Care Management (AMB) (Signed)
Chronic Care Management   Follow Up Note   12/08/2020 Name: [name] MRN: [id number] DOB: May 05, 1946  Referred by: Volney American, PA-C Reason for referral : Chronic Care Management (RNCM Follow up call for Chronic Disease Management and Care coordination Needs)   Tami Cummings is a 74 y.o. year old female who is a primary care patient of Volney American, [place]. The CCM team was consulted for assistance with chronic disease management and care coordination needs.    Review of patient status, including review of consultants reports, relevant laboratory and other test results, and collaboration with appropriate care team members and the patient's provider was performed as part of comprehensive patient evaluation and provision of chronic care management services.    SDOH (Social Determinants of Health) assessments performed: Yes See Care Plan activities for detailed interventions related to [facility])     Outpatient Encounter Medications as of 12/08/2020  Medication Sig  . amLODipine (NORVASC) 10 MG tablet Take 1 tablet by mouth once daily  . aspirin EC 81 MG tablet Take 81 mg by mouth daily. am  . atenolol (TENORMIN) 100 MG tablet Take 1 tablet (100 mg total) by mouth daily. (Patient taking differently: Take 100 mg by mouth daily. Takes in the evening)  . Blood Glucose Monitoring Suppl (ONE TOUCH ULTRA 2) w/Device KIT USE ONCE TO TWICE DAILY  . Cholecalciferol 1.25 MG (50000 UT) TABS Take 1 tablet by mouth once a week. For 8 weeks and then stop.  Return to office for lab draw.  . empagliflozin (JARDIANCE) 25 MG TABS tablet TAKE 1 TABLET BY MOUTH ONCE DAILY BEFORE  BREAKFAST  . glucose blood (ONE TOUCH ULTRA TEST) test strip USE ONE TO TWO STRIPS TO CHECK GLUCOSE ONCE DAILY  . Insulin Glargine-Lixisenatide (SOLIQUA) 100-33 UNT-MCG/ML SOPN Inject 60 Units into the skin daily. Start 30 u increase 2 u every 2 days as needed max 60u a day  . insulin lispro (HUMALOG  KWIKPEN) 100 UNIT/ML KwikPen Inject 0.25 mLs (25 Units total) into the skin 2 (two) times daily before a meal.  . Insulin Pen Needle (BD PEN NEEDLE MICRO U/F) 32G X 6 MM MISC USE 4 TO 5 DAILY WITH INSULIN USE  . JANUVIA 50 MG tablet Take 1 tablet by mouth once daily  . lovastatin (MEVACOR) 40 MG tablet Take 1 tablet (40 mg total) by mouth daily with breakfast.  . spironolactone (ALDACTONE) 25 MG tablet Take 1 tablet (25 mg total) by mouth daily.  . vitamin B-12 (CYANOCOBALAMIN) 1000 MCG tablet Take 1 tablet (1,000 mcg total) by mouth daily.   No facility-administered encounter medications on file as of 12/08/2020.     Objective:  BP Readings from Last 3 Encounters:  10/15/20 117/74  04/30/20 (!) 143/80  03/21/20 128/66   Lab Results  Component Value Date   HGBA1C 9.1 (H) 10/15/2020    Goals Addressed            This Visit's Progress   . RNCM: I don't take my blood pressure every day and I take my sugars when I think about it       Current Barriers:  . Chronic Disease Management support, education, and care coordination needs related to HTN, HLD, and DMII  Clinical Goal(s) related to HTN, HLD, and DMII:  Over the next 120 days, patient will:  . Work with the care management team to address educational, disease management, and care coordination needs  . Begin or continue self health monitoring  activities as directed today Measure and record cbg (blood glucose) BID times daily and Measure and record blood pressure 4 times per week, work toward keeping a log of readings, and work toward increasing activity level . Call provider office for new or worsened signs and symptoms Blood glucose findings outside established parameters, Blood pressure findings outside established parameters, and New or worsened symptom related to HTN, Dm2, and HLD . Call care management team with questions or concerns . Verbalize basic understanding of patient centered plan of care established  today  Interventions related to HTN, HLD, and DMII:  . Evaluation of current treatment plans and patient's adherence to plan as established by provider.  The patient endorses compliance with medications and plan of care. 12-08-2020: The patient states that she feels fine and is doing well. Staying busy with her grandchildren.  . Assessed patient understanding of disease states.  The patient has a good understanding of her chronic conditions.  The patient endorses checking blood sugars. 12-08-2020: The patient is not taking her blood sugars consistently. The patient states she is "too busy" when having 2 grand kids around. Education on the importance of checking blood sugars consistently.  Education on the impact of uncontrolled DM on kidneys, eyes and other organ systems. The patient states her blood sugar last night at dinner was 147.  Education on fasting blood sugars or <130 and post prandial <180.  The patient states that she will try to take her blood sugars more but feels fine. The most recent hemoglobin A1C was 9.1 on 10-15-2020. Review of hemoglobin A1c being 7.0 or less for optimal management.  . Assessed patient's education and care coordination needs.  Education on the benefits of a heart healthy/ADA diet. The patient states she only adds salt to some of her food. The patient denies using a lot of sodium. The patient states that her weakness is "sweets".  Education on Fiber one options for satisfying her sweet cravings. The patient will check into this. Education and support given. 12-08-2020: the patient states she is trying to be more mindful of her diet. She does not eat a lot. The patient is enjoying some of the fruits and vegetables, she eats a lot of "country food". Education on portion control, decreased sugars, sodium and fats. The patient denies any issues related to dietary restrictions. Review of watching sweets around the holidays and the patient states that she is watching sweets and not  eating a lot.  . Provided disease specific education to patient: Educated on the benefits of monitoring blood sugars as prescribed and taking blood pressure weekly. The patient states she gets busy and forgets.  12-08-2020: The patient states she has not been taking her blood pressure a lot but when she does it is really good. The patient denies any headache or other issues related to elevated blood pressures.  Nash Dimmer with appropriate clinical care team members regarding patient needs.  The patient currently works with the CCM pharmacist as needed and knows the LCSW is available for resources.  . Evaluation of the patients compliance of writing down blood sugars and blood pressures on the log provided by the pharmacist in January 2021. Encouraged the patient to start using the log daily. The patient is now writing her blood pressure down.  The patient says she is still not taking her blood sugars daily. She had not taken this am but yesterday am was 151. She forgets as she is busy taking care of her grandchildren. Education  on the importance of taking her blood sugars daily BID as prescribed and recording.  Patient states that she has 2 grandchildren that keep her busy. 12-08-2020: the patient is not writing down her readings but can give the RNCM some past readings, the patient has to be reminded frequently on outreaches to check blood sugars, blood pressures, and increase activity to help with management of her chronic conditions. The patient stays busy with her grandchildren and forgets a lot of times to take readings.  Education on care of self so she can continue to care for her grandchildren.  The patient states she feels well and is happy. Reminders during outreaches to take as recommended and write down.  . Evaluation of the patients dietary habits. The patients daughter cooks meals for the patient. The patient states "I don't use much salt".  The member does eat fried foods and processed foods  at times, education on Heart Healthy/ADA diet . Discussed working toward a structured exercise plan.  The patient states she has a "bad knee" and doesn't get out much. 12-08-2020: does take her granddaughters outside and walks and plays with them in the yard. Can't do a lot due to her "bad leg". Encouraged pacing activity.  . Evaluation of medication compliance with Januvia.  The patient endorses compliance with Januvia.  12-08-2020: Review of medications and any new concerns. The patient states that she is not having issues with her medications. The patient did ask if she could stop taking low dose aspirin. The RNCM ask the patient to discuss this with the pcp and the CCM pharmacist. The patient sees the pharmacist on 12-13-2020 at 4 pm.  Reminded the patient to write down questions to ask the pharmacist at her appointment.  Also advised the patient to bring her medications to the appointment. Will send an in basket message to the pharmacist concerning the question the patient had about taking aspirin.   . Evaluation of upcoming appointments: Next appointment with pcp on 12-21-2020 at 0900.  Review of face to face appointment with the pharmacist on 12-13-2020 at 4 pm   Patient Self Care Activities related to HTN, HLD, and DMII:  . Patient is unable to independently self-manage chronic health conditions  Please see past updates related to this goal by clicking on the "Past Updates" button in the selected goal           Plan:   Telephone follow up appointment with care management team member scheduled for: 02-09-2021 at [place] am   Noreene Larsson RN, MSN, [facility] Mobile: [phone]

## 2020-12-08 NOTE — Patient Instructions (Signed)
Visit Information  Goals Addressed            This Visit's Progress    RNCM: I don't take my blood pressure every day and I take my sugars when I think about it       Current Barriers:   Chronic Disease Management support, education, and care coordination needs related to HTN, HLD, and DMII  Clinical Goal(s) related to HTN, HLD, and DMII:  Over the next 120 days, patient will:   Work with the care management team to address educational, disease management, and care coordination needs   Begin or continue self health monitoring activities as directed today Measure and record cbg (blood glucose) BID times daily and Measure and record blood pressure 4 times per week, work toward keeping a log of readings, and work toward increasing activity level  Call provider office for new or worsened signs and symptoms Blood glucose findings outside established parameters, Blood pressure findings outside established parameters, and New or worsened symptom related to HTN, Dm2, and HLD  Call care management team with questions or concerns  Verbalize basic understanding of patient centered plan of care established today  Interventions related to HTN, HLD, and DMII:   Evaluation of current treatment plans and patient's adherence to plan as established by provider.  The patient endorses compliance with medications and plan of care. 12-08-2020: The patient states that she feels fine and is doing well. Staying busy with her grandchildren.   Assessed patient understanding of disease states.  The patient has a good understanding of her chronic conditions.  The patient endorses checking blood sugars. 12-08-2020: The patient is not taking her blood sugars consistently. The patient states she is "too busy" when having 2 grand kids around. Education on the importance of checking blood sugars consistently.  Education on the impact of uncontrolled DM on kidneys, eyes and other organ systems. The patient states her  blood sugar last night at dinner was 147.  Education on fasting blood sugars or <130 and post prandial <180.  The patient states that she will try to take her blood sugars more but feels fine. The most recent hemoglobin A1C was 9.1 on 10-15-2020. Review of hemoglobin A1c being 7.0 or less for optimal management.   Assessed patient's education and care coordination needs.  Education on the benefits of a heart healthy/ADA diet. The patient states she only adds salt to some of her food. The patient denies using a lot of sodium. The patient states that her weakness is "sweets".  Education on Fiber one options for satisfying her sweet cravings. The patient will check into this. Education and support given. 12-08-2020: the patient states she is trying to be more mindful of her diet. She does not eat a lot. The patient is enjoying some of the fruits and vegetables, she eats a lot of "country food". Education on portion control, decreased sugars, sodium and fats. The patient denies any issues related to dietary restrictions. Review of watching sweets around the holidays and the patient states that she is watching sweets and not eating a lot.   Provided disease specific education to patient: Educated on the benefits of monitoring blood sugars as prescribed and taking blood pressure weekly. The patient states she gets busy and forgets.  12-08-2020: The patient states she has not been taking her blood pressure a lot but when she does it is really good. The patient denies any headache or other issues related to elevated blood pressures.   Collaborated  with appropriate clinical care team members regarding patient needs.  The patient currently works with the CCM pharmacist as needed and knows the LCSW is available for resources.   Evaluation of the patients compliance of writing down blood sugars and blood pressures on the log provided by the pharmacist in January 2021. Encouraged the patient to start using the log daily.  The patient is now writing her blood pressure down.  The patient says she is still not taking her blood sugars daily. She had not taken this am but yesterday am was 151. She forgets as she is busy taking care of her grandchildren. Education on the importance of taking her blood sugars daily BID as prescribed and recording.  Patient states that she has 2 grandchildren that keep her busy. 12-08-2020: the patient is not writing down her readings but can give the RNCM some past readings, the patient has to be reminded frequently on outreaches to check blood sugars, blood pressures, and increase activity to help with management of her chronic conditions. The patient stays busy with her grandchildren and forgets a lot of times to take readings.  Education on care of self so she can continue to care for her grandchildren.  The patient states she feels well and is happy. Reminders during outreaches to take as recommended and write down.   Evaluation of the patients dietary habits. The patients daughter cooks meals for the patient. The patient states "I don't use much salt".  The member does eat fried foods and processed foods at times, education on Heart Healthy/ADA diet  Discussed working toward a structured exercise plan.  The patient states she has a "bad knee" and doesn't get out much. 12-08-2020: does take her granddaughters outside and walks and plays with them in the yard. Can't do a lot due to her "bad leg". Encouraged pacing activity.   Evaluation of medication compliance with Januvia.  The patient endorses compliance with Januvia.  12-08-2020: Review of medications and any new concerns. The patient states that she is not having issues with her medications. The patient did ask if she could stop taking low dose aspirin. The RNCM ask the patient to discuss this with the pcp and the CCM pharmacist. The patient sees the pharmacist on 12-13-2020 at 4 pm.  Reminded the patient to write down questions to ask the  pharmacist at her appointment.  Also advised the patient to bring her medications to the appointment. Will send an in basket message to the pharmacist concerning the question the patient had about taking aspirin.    Evaluation of upcoming appointments: Next appointment with pcp on 12-21-2020 at 0900.  Review of face to face appointment with the pharmacist on 12-13-2020 at 4 pm   Patient Self Care Activities related to HTN, HLD, and DMII:   Patient is unable to independently self-manage chronic health conditions  Please see past updates related to this goal by clicking on the "Past Updates" button in the selected goal         The patient verbalized understanding of instructions, educational materials, and care plan provided today and declined offer to receive copy of patient instructions, educational materials, and care plan.   Telephone follow up appointment with care management team member scheduled for: 02-09-2021 at [place] am  Noreene Larsson RN, MSN, [facility] Mobile: [phone]

## 2020-12-13 ENCOUNTER — Ambulatory Visit: Payer: Self-pay

## 2020-12-13 NOTE — Chronic Care Management (AMB) (Deleted)
Chronic Care Management Pharmacy  Name: Tami Cummings  MRN: [id number] DOB: [birth year]   Chief Complaint/ HPI  Tami Cummings,  74 y.o. , female presents for their Follow-Up CCM visit with the clinical pharmacist via telephone.  PCP : Tami American, PA-C Patient Care Team: Tami Cummings as PCP - General (Family Medicine) Tami Parkinson, MD (Ophthalmology) Tami Ingles, RN as [place] [facility] ([place]) Tami Cummings, [facility] as Pharmacist (Pharmacist)  Their chronic conditions include: Hypertension, Hyperlipidemia and Diabetes   Office Visits: 10/15/20-  04/30/20- Tami Roof, PA- blood work  05/25/20- Tami Cummings, Tami Cummings, PharmD, CCM - increase Soliqua to 60 u, check post meal BG, change soliquia to Basal + GLP1 if A1C stays high    Allergies  Allergen Reactions  . Actos [Pioglitazone] Other (See Comments)    CHF  . Amlodipine Swelling    Leg swelling   . Benazepril Other (See Comments)    angioedema  . Hydrochlorothiazide Other (See Comments)    angioedema  . Metformin And Related Diarrhea    Medications: Outpatient Encounter Medications as of 12/13/2020  Medication Sig  . amLODipine (NORVASC) 10 MG tablet Take 1 tablet by mouth once daily  . aspirin EC 81 MG tablet Take 81 mg by mouth daily. am  . atenolol (TENORMIN) 100 MG tablet Take 1 tablet (100 mg total) by mouth daily. (Patient taking differently: Take 100 mg by mouth daily. Takes in the evening)  . Blood Glucose Monitoring Suppl (ONE TOUCH ULTRA 2) w/Device KIT USE ONCE TO TWICE DAILY  . Cholecalciferol 1.25 MG (50000 UT) TABS Take 1 tablet by mouth once a week. For 8 weeks and then stop.  Return to office for lab draw.  . empagliflozin (JARDIANCE) 25 MG TABS tablet TAKE 1 TABLET BY MOUTH ONCE DAILY BEFORE  BREAKFAST  . glucose blood (ONE TOUCH ULTRA TEST) test strip USE ONE TO TWO STRIPS TO CHECK GLUCOSE ONCE DAILY  . Insulin  Glargine-Lixisenatide (SOLIQUA) 100-33 UNT-MCG/ML SOPN Inject 60 Units into the skin daily. Start 30 u increase 2 u every 2 days as needed max 60u a day  . insulin lispro (HUMALOG KWIKPEN) 100 UNIT/ML KwikPen Inject 0.25 mLs (25 Units total) into the skin 2 (two) times daily before a meal.  . Insulin Pen Needle (BD PEN NEEDLE MICRO U/F) 32G X 6 MM MISC USE 4 TO 5 DAILY WITH INSULIN USE  . JANUVIA 50 MG tablet Take 1 tablet by mouth once daily  . lovastatin (MEVACOR) 40 MG tablet Take 1 tablet (40 mg total) by mouth daily with breakfast.  . spironolactone (ALDACTONE) 25 MG tablet Take 1 tablet (25 mg total) by mouth daily.  . vitamin B-12 (CYANOCOBALAMIN) 1000 MCG tablet Take 1 tablet (1,000 mcg total) by mouth daily.   No facility-administered encounter medications on file as of 12/13/2020.    Wt Readings from Last 3 Encounters:  10/15/20 205 lb 9.6 oz (93.3 kg)  01/29/20 204 lb (92.5 kg)  10/20/19 197 lb (89.4 kg)    Current Diagnosis/Assessment:    Goals Addressed   None     Diabetes   A1c goal <7%  Recent Relevant Labs: Lab Results  Component Value Date/Time   HGBA1C 9.1 (H) 10/15/2020 04:05 PM   HGBA1C 8.9 (H) 05/28/2020 10:41 AM   HGBA1C 7.8 (H) 12/04/2018 11:06 AM   HGBA1C 8.1 (H) 05/30/2018 10:25 AM   HGBA1C 7.9 07/13/2016 12:00 AM   MICROALBUR 10 10/15/2020 02:59  PM   MICROALBUR 30 (H) 12/04/2018 11:06 AM    Last diabetic Eye exam:  Lab Results  Component Value Date/Time   HMDIABEYEEXA No Retinopathy 04/01/2018 12:00 AM    Last diabetic Foot exam: No results found for: HMDIABFOOTEX   Checking BG: 3-4 times weekly  Recent FBG Readings: 140s  Recent pre-meal BG readings: 140s Recent 2hr PP BG readings:  Not checking Recent HS BG readings: Not checking   Patient has failed these meds in past:  Onglyza per chart, metformin -diarrhea and nausea Patient is currently uncontrolled on the following medications: . Jardiance 25 mg qd . Januvia 50 mg qd . Lispro  25 u ac meal bid .  Soliqua 60 units qhs  We discussed: diet and exercise extensively. Patient lives with daughter and 2 gradchildren, 28 and 65 years old. 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 Reports daughter cooks dinner nightly. Patient reports irregular dietary habits sometimes eating cereal for breakfast and sometimes eating nothing until dinner. She does not check her BG consistently. She reports taking her lispro at 6AM each morning without checking BG before and sometimes doesn't eat after. She denies any symptoms of hypoglycemia or missed doses. Reviewed last appointment notes with Tami Cummings indicating follow up in November. And encouraged patient to call and schedule after our call. Encouraged patient to check post meal to help guide humalog therapy.   Plan  If A1C remains elevated consider  Discontinuing Soliqua and splitting into separate basal product and GLP-1. D/C Januvia due to duplicate MOA.   Hypertension   BP goal is:  <130/80  Office blood pressures are  BP Readings from Last 3 Encounters:  10/15/20 117/74  04/30/20 (!) 143/80  03/21/20 128/66   BMP Latest Ref Rng & Units 10/15/2020 05/28/2020 10/20/2019  Glucose 65 - 99 mg/dL 157(H) 164(H) 137(H)  BUN 8 - 27 mg/dL _0 Creatinine 0.57 - 1.00 mg/dL 0.95 1.07(H) 0.87  BUN/Creat Ratio 12 - _1 Sodium 134 - 144 mmol/L 139 137 141  Potassium 3.5 - 5.2 mmol/L 4.7 4.3 4.2  Chloride 96 - 106 mmol/L 103 102 104  CO2 20 - 29 mmol/L _2 Calcium 8.7 - 10.3 mg/dL 9.3 9.6 9.6    Patient checks BP at home hasn' t checked in ~ 1 month   Patient has failed these meds in the past: NA Patient is currentlyquery  controlled on the following medications:  . Amlodipine 10 mg qd . Atenolol 1106m qhs  . Spironolactone 25 mg qd  We discussed diet and exercise. Patient states she doesn't use much salt but occasionally adds to food "for flavor."  She reports her BP cuff hasn't been working so she hasn't been checking.  She is familiar with her [employer] benefits and will use to obtain a new meter this week. Does not recall ever being on an ACE- I or ARB.  Plan  Consider addition of ACE-I or ARB    Hyperlipidemia   LDL goal < 70  Lipid Panel     Component Value Date/Time   CHOL 124 10/15/2020 1504   CHOL 137 12/04/2018 1106   TRIG 161 (H) 10/15/2020 1504   TRIG 262 (H) 12/04/2018 1106   HDL 35 (L) 10/15/2020 1504   LDLCALC 61 10/15/2020 1504    Hepatic Function Latest Ref Rng & Units 05/28/2020 10/20/2019 07/18/2019  Total Protein 6.0 - 8.5 g/dL 6.5 6.8 6.6  Albumin 3.7 - 4.7 g/dL 4.2  4.4 4.2  AST 0 - 40 IU/L _0 ALT 0 - 32 IU/L _1 Alk Phosphatase 48 - 121 IU/L 87 98 83  Total Bilirubin 0.0 - 1.2 mg/dL <0.2 0.2 0.2     The ASCVD Risk score ([place]., et al., 2013) failed to calculate for the following reasons:   The valid total cholesterol range is 130 to 320 mg/dL   Patient has failed these meds in past: NA Patient is currently uncontrolled on the following medications:   Lovastatin 40 mg qd  Aspirin 81 mg qd  We discussed:  Aspirin no longer recommended for primary prevention in patients > 70. High intensity statin for patients with DM.  Plan  Consider changing lovastatin to rosuvastatin 20 mg. Consider d/c aspirin for primary prevention given age >20.    Medication Management   Pt uses [facility] for all medications Uses pill box?  Pt endorses  compliance  Will discuss pharmacy at next visit.  Plan  Continue current medication management strategy    Follow up: 2  month phone visit  Junita Push. Kenton Kingfisher PharmD, [facility] [phone]

## 2020-12-31 ENCOUNTER — Encounter: Payer: Medicare Other | Admitting: Nurse Practitioner

## 2020-12-31 ENCOUNTER — Encounter: Payer: Medicare PPO | Admitting: Family Medicine

## 2021-01-01 ENCOUNTER — Other Ambulatory Visit: Payer: Self-pay | Admitting: Family Medicine

## 2021-01-01 ENCOUNTER — Encounter: Payer: Self-pay | Admitting: Nurse Practitioner

## 2021-01-01 DIAGNOSIS — M8580 Other specified disorders of bone density and structure, unspecified site: Secondary | ICD-10-CM | POA: Insufficient documentation

## 2021-01-03 ENCOUNTER — Telehealth: Payer: Self-pay | Admitting: Pharmacist

## 2021-01-05 ENCOUNTER — Other Ambulatory Visit: Payer: Self-pay

## 2021-01-05 ENCOUNTER — Encounter: Payer: Self-pay | Admitting: Nurse Practitioner

## 2021-01-05 ENCOUNTER — Ambulatory Visit (INDEPENDENT_AMBULATORY_CARE_PROVIDER_SITE_OTHER): Payer: Medicare Other | Admitting: Nurse Practitioner

## 2021-01-05 VITALS — BP 126/68 | HR 74 | Temp 97.5°F | Ht 63.0 in | Wt 203.8 lb

## 2021-01-05 DIAGNOSIS — E538 Deficiency of other specified B group vitamins: Secondary | ICD-10-CM

## 2021-01-05 DIAGNOSIS — E1142 Type 2 diabetes mellitus with diabetic polyneuropathy: Secondary | ICD-10-CM | POA: Diagnosis not present

## 2021-01-05 DIAGNOSIS — E1159 Type 2 diabetes mellitus with other circulatory complications: Secondary | ICD-10-CM | POA: Diagnosis not present

## 2021-01-05 DIAGNOSIS — N1831 Chronic kidney disease, stage 3a: Secondary | ICD-10-CM | POA: Diagnosis not present

## 2021-01-05 DIAGNOSIS — E1169 Type 2 diabetes mellitus with other specified complication: Secondary | ICD-10-CM | POA: Diagnosis not present

## 2021-01-05 DIAGNOSIS — D692 Other nonthrombocytopenic purpura: Secondary | ICD-10-CM

## 2021-01-05 DIAGNOSIS — Z0000 Encounter for general adult medical examination without abnormal findings: Secondary | ICD-10-CM

## 2021-01-05 DIAGNOSIS — M85851 Other specified disorders of bone density and structure, right thigh: Secondary | ICD-10-CM

## 2021-01-05 DIAGNOSIS — I152 Hypertension secondary to endocrine disorders: Secondary | ICD-10-CM

## 2021-01-05 DIAGNOSIS — E559 Vitamin D deficiency, unspecified: Secondary | ICD-10-CM

## 2021-01-05 DIAGNOSIS — E785 Hyperlipidemia, unspecified: Secondary | ICD-10-CM

## 2021-01-05 LAB — BAYER DCA HB A1C WAIVED: HB A1C (BAYER DCA - WAIVED): 9.4 % — ABNORMAL HIGH (ref <7.0)

## 2021-01-05 LAB — MICROALBUMIN, URINE WAIVED
Creatinine, Urine Waived: 50 mg/dL (ref 10–300)
Microalb, Ur Waived: 30 mg/L — ABNORMAL HIGH (ref 0–19)

## 2021-01-05 MED ORDER — TRESIBA FLEXTOUCH 100 UNIT/ML ~~LOC~~ SOPN: 40.0000 [IU] | PEN_INJECTOR | Freq: Every day | SUBCUTANEOUS | 4 refills | Status: DC

## 2021-01-05 MED ORDER — ATENOLOL 100 MG PO TABS: 100.0000 mg | ORAL_TABLET | Freq: Every day | ORAL | 4 refills | Status: DC

## 2021-01-05 MED ORDER — EMPAGLIFLOZIN 25 MG PO TABS
ORAL_TABLET | ORAL | 4 refills | Status: DC
Start: 2021-01-05 — End: 2021-05-09

## 2021-01-05 MED ORDER — TRULICITY 0.75 MG/0.5ML ~~LOC~~ SOAJ: 0.7500 mg | SUBCUTANEOUS | 4 refills | Status: DC

## 2021-01-05 MED ORDER — VITAMIN B-12 1000 MCG PO TABS: 1000.0000 ug | ORAL_TABLET | Freq: Every day | ORAL | 4 refills | Status: DC

## 2021-01-05 MED ORDER — AMLODIPINE BESYLATE 10 MG PO TABS: 10.0000 mg | ORAL_TABLET | Freq: Every day | ORAL | 4 refills | Status: DC

## 2021-01-05 MED ORDER — CHOLECALCIFEROL 1.25 MG (50000 UT) PO TABS: 1.0000 | ORAL_TABLET | ORAL | 1 refills | Status: DC

## 2021-01-05 MED ORDER — SPIRONOLACTONE 25 MG PO TABS: 25.0000 mg | ORAL_TABLET | Freq: Every day | ORAL | 4 refills | Status: DC

## 2021-01-05 NOTE — Patient Instructions (Addendum)
I would like to stop your Soliqua 60 units and start Tresiba 40 units nightly injection and Trulicity 4.33 MG weekly injection -- these are similar to medications in [place] only a little better.   Diabetes Mellitus and Nutrition, Adult When you have diabetes, or diabetes mellitus, it is very important to have healthy eating habits because your blood sugar (glucose) levels are greatly affected by what you eat and drink. Eating healthy foods in the right amounts, at about the same times every day, can help you:  Control your blood glucose.  Lower your risk of heart disease.  Improve your blood pressure.  Reach or maintain a healthy weight. What can affect my meal plan? Every person with diabetes is different, and each person has different needs for a meal plan. Your health care provider may recommend that you work with a dietitian to make a meal plan that is best for you. Your meal plan may vary depending on factors such as:  The calories you need.  The medicines you take.  Your weight.  Your blood glucose, blood pressure, and cholesterol levels.  Your activity level.  Other health conditions you have, such as heart or kidney disease. How do carbohydrates affect me? Carbohydrates, also called carbs, affect your blood glucose level more than any other type of food. Eating carbs naturally raises the amount of glucose in your blood. Carb counting is a method for keeping track of how many carbs you eat. Counting carbs is important to keep your blood glucose at a healthy level, especially if you use insulin or take certain oral diabetes medicines. It is important to know how many carbs you can safely have in each meal. This is different for every person. Your dietitian can help you calculate how many carbs you should have at each meal and for each snack. How does alcohol affect me? Alcohol can cause a sudden decrease in blood glucose (hypoglycemia), especially if you use insulin or take  certain oral diabetes medicines. Hypoglycemia can be a life-threatening condition. Symptoms of hypoglycemia, such as sleepiness, dizziness, and confusion, are similar to symptoms of having too much alcohol.  Do not drink alcohol if: ? Your health care provider tells you not to drink. ? You are pregnant, may be pregnant, or are planning to become pregnant.  If you drink alcohol: ? Do not drink on an empty stomach. ? Limit how much you use to:  0-1 drink a day for women.  0-2 drinks a day for men. ? Be aware of how much alcohol is in your drink. In the [place] one drink equals one 12 oz bottle of beer (355 mL), one 5 oz glass of wine (148 mL), or one 1 oz glass of hard liquor (44 mL). ? Keep yourself hydrated with water, diet soda, or unsweetened iced tea.  Keep in mind that regular soda, juice, and other mixers may contain a lot of sugar and must be counted as carbs. What are tips for following this plan? Reading food labels  Start by checking the serving size on the "Nutrition Facts" label of packaged foods and drinks. The amount of calories, carbs, fats, and other nutrients listed on the label is based on one serving of the item. Many items contain more than one serving per package.  Check the total grams (g) of carbs in one serving. You can calculate the number of servings of carbs in one serving by dividing the total carbs by 15. For example, if a food has  30 g of total carbs per serving, it would be equal to 2 servings of carbs.  Check the number of grams (g) of saturated fats and trans fats in one serving. Choose foods that have a low amount or none of these fats.  Check the number of milligrams (mg) of salt (sodium) in one serving. Most people should limit total sodium intake to less than 2,300 mg per day.  Always check the nutrition information of foods labeled as "low-fat" or "nonfat." These foods may be higher in added sugar or refined carbs and should be avoided.  Talk to your  dietitian to identify your daily goals for nutrients listed on the label. Shopping  Avoid buying canned, pre-made, or processed foods. These foods tend to be high in fat, sodium, and added sugar.  Shop around the outside edge of the grocery store. This is where you will most often find fresh fruits and vegetables, bulk grains, fresh meats, and fresh dairy. Cooking  Use low-heat cooking methods, such as baking, instead of high-heat cooking methods like deep frying.  Cook using healthy oils, such as olive, canola, or sunflower oil.  Avoid cooking with butter, cream, or high-fat meats. Meal planning  Eat meals and snacks regularly, preferably at the same times every day. Avoid going long periods of time without eating.  Eat foods that are high in fiber, such as fresh fruits, vegetables, beans, and whole grains. Talk with your dietitian about how many servings of carbs you can eat at each meal.  Eat 4-6 oz (112-168 g) of lean protein each day, such as lean meat, chicken, fish, eggs, or tofu. One ounce (oz) of lean protein is equal to: ? 1 oz (28 g) of meat, chicken, or fish. ? 1 egg. ?  cup (62 g) of tofu.  Eat some foods each day that contain healthy fats, such as avocado, nuts, seeds, and fish.   What foods should I eat? Fruits Berries. Apples. Oranges. Peaches. Apricots. Plums. Grapes. Mango. Papaya. Pomegranate. Kiwi. Cherries. Vegetables Lettuce. Spinach. Leafy greens, including kale, chard, collard greens, and mustard greens. Beets. Cauliflower. Cabbage. Broccoli. Carrots. Green beans. Tomatoes. Peppers. Onions. Cucumbers. Brussels sprouts. Grains Whole grains, such as whole-wheat or whole-grain bread, crackers, tortillas, cereal, and pasta. Unsweetened oatmeal. Quinoa. Brown or wild rice. Meats and other proteins Seafood. Poultry without skin. Lean cuts of poultry and beef. Tofu. Nuts. Seeds. Dairy Low-fat or fat-free dairy products such as milk, yogurt, and cheese. The items  listed above may not be a complete list of foods and beverages you can eat. Contact a dietitian for more information. What foods should I avoid? Fruits Fruits canned with syrup. Vegetables Canned vegetables. Frozen vegetables with butter or cream sauce. Grains Refined white flour and flour products such as bread, pasta, snack foods, and cereals. Avoid all processed foods. Meats and other proteins Fatty cuts of meat. Poultry with skin. Breaded or fried meats. Processed meat. Avoid saturated fats. Dairy Full-fat yogurt, cheese, or milk. Beverages Sweetened drinks, such as soda or iced tea. The items listed above may not be a complete list of foods and beverages you should avoid. Contact a dietitian for more information. Questions to ask a health care provider  Do I need to meet with a diabetes educator?  Do I need to meet with a dietitian?  What number can I call if I have questions?  When are the best times to check my blood glucose? Where to find more information:  American Diabetes Association: diabetes.org  Academy of Nutrition and Dietetics: www.eatright.[employer] of Diabetes and Digestive and Kidney Diseases: CarFlippers.tn  Association of Diabetes Care and Education Specialists: www.diabeteseducator.org Summary  It is important to have healthy eating habits because your blood sugar (glucose) levels are greatly affected by what you eat and drink.  A healthy meal plan will help you control your blood glucose and maintain a healthy lifestyle.  Your health care provider may recommend that you work with a dietitian to make a meal plan that is best for you.  Keep in mind that carbohydrates (carbs) and alcohol have immediate effects on your blood glucose levels. It is important to count carbs and to use alcohol carefully. This information is not intended to replace advice given to you by your health care provider. Make sure you discuss any questions you have  with your health care provider. Document Revised: 11/11/2019 Document Reviewed: 11/11/2019 Elsevier Patient Education  2021 [employer].

## 2021-01-05 NOTE — Progress Notes (Signed)
BP 126/68 (BP Location: Left Arm)   Pulse 74   Temp (!) 97.5 F (36.4 C)   Ht 5' 3" (1.6 m)   Wt 203 lb 12.8 oz (92.4 kg)   SpO2 97%   BMI 36.10 kg/m    Subjective:    [name], female    DOB: [birth year], 75 y.o.   MRN: [id number]  HPI: Tami Cummings is a 75 y.o. female presenting on 01/05/2021 for comprehensive medical examination. Current medical complaints include:none  She currently lives with: daughter Menopausal Symptoms: no   DIABETES Last A1C 9.1%.  Currently taking Januvia, Jardiance, Soliqua (60 units), Humalog 25 units before meals.  In past Actos caused "issues" and Metformin caused diarrhea.  Does endorse having a big sweet tooth and difficulty with diet -- discussed with her changing [place] to [place] and Trulicity which PCP discussed with CCM PharmD -- educated her on these changes. Hypoglycemic episodes: occasional Polydipsia/polyuria: no Visual disturbance: no Chest pain: no Paresthesias: no Glucose Monitoring: yes             Accucheck frequency: Not checking needs battery for machine             Fasting glucose:              Post prandial:             Evening:              Before meals: Taking Insulin?: yes             Long acting insulin: 60 units Soliqua             Short acting insulin: 25 units Humalog Blood Pressure Monitoring: rarely Retinal Examination: Not up to Date Foot Exam: Up to Date Pneumovax: Up to Date Influenza: Up to Date Aspirin: yes   HYPERTENSION / HYPERLIPIDEMIA Currently taking Amlodipine, Spironolactone, Atenolol & Lovastatin. In past Benazepril -- angioedema.    History of smoking -- quit 10 years ago.  Smoked about 1 PPD. Satisfied with current treatment? yes Duration of hypertension: chronic BP monitoring frequency: not checking BP range:  BP medication side effects: no Duration of hyperlipidemia: chronic Cholesterol medication side effects: no Cholesterol supplements:  none Medication compliance: good compliance Aspirin: yes Recent stressors: no Recurrent headaches: no Visual changes: no Palpitations: no Dyspnea: no Chest pain: no Lower extremity edema: no Dizzy/lightheaded: no   Depression Screen done today and results listed below:  Depression screen [facility] 2/9 01/05/2021 05/27/2020 01/23/2020 05/23/2018 01/29/2018  Decreased Interest 0 0 0 0 0  Down, Depressed, Hopeless 0 0 0 0 0  PHQ - 2 Score 0 0 0 0 0  Altered sleeping - - - - 0  Tired, decreased energy - - - - 1  Change in appetite - - - - 0  Feeling bad or failure about yourself  - - - - 0  Trouble concentrating - - - - 0  Moving slowly or fidgety/restless - - - - 0  Suicidal thoughts - - - - 0  PHQ-9 Score - - - - 1    The patient does not have a history of falls. I did not complete a risk assessment for falls. A plan of care for falls was not documented.   Past Medical History:  Past Medical History:  Diagnosis Date  . Arthritis    knee  . COPD (chronic obstructive pulmonary disease) ([place])   . Diabetes mellitus without complication ([place])  type 2  . Dyspnea   . Hyperlipidemia   . Hypertension    controlled  . Neuromuscular disorder (HCC)    numbness in hands and feet  . Shoulder pain, right   . Wears dentures    upper and lower    Surgical History:  Past Surgical History:  Procedure Laterality Date  . ABDOMINAL HYSTERECTOMY    . BREAST BIOPSY Right 04/12/2016   stereo bx/clip- neg  . CARPAL TUNNEL RELEASE  06/17/00  . CATARACT EXTRACTION W/PHACO Left 09/20/2015   Procedure: CATARACT EXTRACTION PHACO AND INTRAOCULAR LENS PLACEMENT ([place]);  Surgeon: Ronnell Freshwater, MD;  Location: [facility];  Service: Ophthalmology;  Laterality: Left;  DIABETIC - insulin  . CATARACT EXTRACTION W/PHACO Right 06/03/2018   Procedure: CATARACT EXTRACTION PHACO AND INTRAOCULAR LENS PLACEMENT ([place]) RIGHT DIABETES;  Surgeon: Eulogio Bear, MD;  Location: [facility];   Service: Ophthalmology;  Laterality: Right;  Diabetes-insulin dependent  . CESAREAN SECTION     x2  . CHOLECYSTECTOMY    . EYE SURGERY      Medications:  Current Outpatient Medications on File Prior to Visit  Medication Sig  . Blood Glucose Monitoring Suppl (ONE TOUCH ULTRA 2) w/Device KIT USE ONCE TO TWICE DAILY  . glucose blood (ONE TOUCH ULTRA TEST) test strip USE ONE TO TWO STRIPS TO CHECK GLUCOSE ONCE DAILY  . insulin lispro (HUMALOG KWIKPEN) 100 UNIT/ML KwikPen Inject 0.25 mLs (25 Units total) into the skin 2 (two) times daily before a meal.  . Insulin Pen Needle (BD PEN NEEDLE MICRO U/F) 32G X 6 MM MISC USE 4 TO 5 DAILY WITH INSULIN USE  . JANUVIA 50 MG tablet Take 1 tablet by mouth once daily  . lovastatin (MEVACOR) 40 MG tablet Take 1 tablet (40 mg total) by mouth daily with breakfast.   No current facility-administered medications on file prior to visit.    Allergies:  Allergies  Allergen Reactions  . Actos [Pioglitazone] Other (See Comments)    CHF  . Amlodipine Swelling    Leg swelling   . Benazepril Other (See Comments)    angioedema  . Hydrochlorothiazide Other (See Comments)    angioedema  . Metformin And Related Diarrhea    Social History:  Social History   Socioeconomic History  . Marital status: Single    Spouse name: Not on file  . Number of children: Not on file  . Years of education: Not on file  . Highest education level: High school graduate  Occupational History  . Occupation: retired   Tobacco Use  . Smoking status: Former Smoker    Packs/day: 1.00    Years: 20.00    Pack years: 20.00    Types: Cigarettes    Quit date: 08/26/2013    Years since quitting: 7.3  . Smokeless tobacco: Never Used  Vaping Use  . Vaping Use: Never used  Substance and Sexual Activity  . Alcohol use: No    Alcohol/week: 0.0 standard drinks  . Drug use: No  . Sexual activity: Not on file  Other Topics Concern  . Not on file  Social History Narrative    Daughter lives with her currently    Takes care of granddaughter    Social Determinants of Health   Financial Resource Strain: Low Risk   . Difficulty of Paying Living Expenses: Not hard at all  Food Insecurity: No Food Insecurity  . Worried About Charity fundraiser in the Last Year: Never true  .  Ran Out of Food in the Last Year: Never true  Transportation Needs: No Transportation Needs  . Lack of Transportation (Medical): No  . Lack of Transportation (Non-Medical): No  Physical Activity: Inactive  . Days of Exercise per Week: 0 days  . Minutes of Exercise per Session: 0 min  Stress: No Stress Concern Present  . Feeling of Stress : Only a little  Social Connections: Socially Isolated  . Frequency of Communication with Friends and Family: More than three times a week  . Frequency of Social Gatherings with Friends and Family: More than three times a week  . Attends Religious Services: Never  . Active Member of Clubs or Organizations: No  . Attends Archivist Meetings: Never  . Marital Status: Divorced  Human resources officer Violence: Not At Risk  . Fear of Current or Ex-Partner: No  . Emotionally Abused: No  . Physically Abused: No  . Sexually Abused: No   Social History   Tobacco Use  Smoking Status Former Smoker  . Packs/day: 1.00  . Years: 20.00  . Pack years: 20.00  . Types: Cigarettes  . Quit date: 08/26/2013  . Years since quitting: 7.3  Smokeless Tobacco Never Used   Social History   Substance and Sexual Activity  Alcohol Use No  . Alcohol/week: 0.0 standard drinks    Family History:  Family History  Problem Relation Age of Onset  . Heart disease Father   . Cancer Sister   . Cancer Brother   . Breast cancer Neg Hx     Past medical history, surgical history, medications, allergies, family history and social history reviewed with patient today and changes made to appropriate areas of the chart.   Review of Systems - negative All other ROS negative  except what is listed above and in the HPI.      Objective:    BP 126/68 (BP Location: Left Arm)   Pulse 74   Temp (!) 97.5 F (36.4 C)   Ht 5' 3" (1.6 m)   Wt 203 lb 12.8 oz (92.4 kg)   SpO2 97%   BMI 36.10 kg/m   Wt Readings from Last 3 Encounters:  01/05/21 203 lb 12.8 oz (92.4 kg)  10/15/20 205 lb 9.6 oz (93.3 kg)  01/29/20 204 lb (92.5 kg)    Physical Exam Vitals and nursing note reviewed.  Constitutional:      General: She is awake. She is not in acute distress.    Appearance: She is well-developed and well-groomed. She is morbidly obese. She is not ill-appearing.  HENT:     Head: Normocephalic and atraumatic.     Right Ear: Hearing, tympanic membrane, ear canal and external ear normal. No drainage.     Left Ear: Hearing, tympanic membrane, ear canal and external ear normal. No drainage.     Nose: Nose normal.     Right Sinus: No maxillary sinus tenderness or frontal sinus tenderness.     Left Sinus: No maxillary sinus tenderness or frontal sinus tenderness.     Mouth/Throat:     Mouth: Mucous membranes are moist.     Pharynx: Oropharynx is clear. Uvula midline. No pharyngeal swelling, oropharyngeal exudate or posterior oropharyngeal erythema.  Eyes:     General: Lids are normal.        Right eye: No discharge.        Left eye: No discharge.     Extraocular Movements: Extraocular movements intact.     Conjunctiva/sclera: Conjunctivae normal.  Pupils: Pupils are equal, round, and reactive to light.     Visual Fields: Right eye visual fields normal and left eye visual fields normal.  Neck:     Thyroid: No thyromegaly.     Vascular: No carotid bruit.     Trachea: Trachea normal.  Cardiovascular:     Rate and Rhythm: Normal rate and regular rhythm.     Heart sounds: Normal heart sounds. No murmur heard. No gallop.   Pulmonary:     Effort: Pulmonary effort is normal. No accessory muscle usage or respiratory distress.     Breath sounds: Normal breath sounds.   Chest:     Comments: Deferred per patient request Abdominal:     General: Bowel sounds are normal.     Palpations: Abdomen is soft. There is no hepatomegaly or splenomegaly.     Tenderness: There is no abdominal tenderness.  Musculoskeletal:        General: Normal range of motion.     Cervical back: Normal range of motion and neck supple.     Right lower leg: No edema.     Left lower leg: No edema.  Lymphadenopathy:     Head:     Right side of head: No submental, submandibular, tonsillar, preauricular or posterior auricular adenopathy.     Left side of head: No submental, submandibular, tonsillar, preauricular or posterior auricular adenopathy.     Cervical: No cervical adenopathy.  Skin:    General: Skin is warm and dry.     Capillary Refill: Capillary refill takes less than 2 seconds.     Findings: No rash.     Comments: Scattered pale purple bruises bilateral upper extremities.  Neurological:     Mental Status: She is alert and oriented to person, place, and time.     Cranial Nerves: Cranial nerves are intact.     Gait: Gait is intact.     Deep Tendon Reflexes: Reflexes are normal and symmetric.     Reflex Scores:      Brachioradialis reflexes are 2+ on the right side and 2+ on the left side.      Patellar reflexes are 2+ on the right side and 2+ on the left side. Psychiatric:        Attention and Perception: Attention normal.        Mood and Affect: Mood normal.        Speech: Speech normal.        Behavior: Behavior normal. Behavior is cooperative.        Thought Content: Thought content normal.        Judgment: Judgment normal.    Results for orders placed or performed in visit on 01/05/21  Bayer DCA Hb A1c Waived  Result Value Ref Range   HB A1C (BAYER DCA - WAIVED) 9.4 (H) <7.0 %  Microalbumin, Urine Waived  Result Value Ref Range   Microalb, Ur Waived 30 (H) 0 - 19 mg/L   Creatinine, Urine Waived 50 10 - 300 mg/dL   Microalb/Creat Ratio 30-300 (H) <30 mg/g       Assessment & Plan:   Problem List Items Addressed This Visit      Cardiovascular and Mediastinum   Hypertension associated with diabetes (HCC)    Chronic, ongoing.  BP at goal today on recheck.  Continue current medication regimen and adjust as needed.  Would benefit from ACE or ARB, but has history of angioedema with Benazepril so unable to add.  Recommend she monitor  BP at home a few days a week and document + focus on DASH diet at home.  Obtain CMP and TSH today.  Return to office in 3 months for HTN visit.         Relevant Medications   amLODipine (NORVASC) 10 MG tablet   atenolol (TENORMIN) 100 MG tablet   empagliflozin (JARDIANCE) 25 MG TABS tablet   spironolactone (ALDACTONE) 25 MG tablet   Dulaglutide (TRULICITY) 5.85 ID/7.8EU SOPN   insulin degludec (TRESIBA FLEXTOUCH) 100 UNIT/ML FlexTouch Pen   Other Relevant Orders   Bayer DCA Hb A1c Waived (Completed)   Microalbumin, Urine Waived (Completed)   TSH   CBC with Differential/Platelet   Comprehensive metabolic panel   Senile purpura (HCC)    Noted on exam, took ASA daily.  Recommend gentle skin cleansing at home and monitor for skin breakdown, if present alert provider immediately.  Obtain CBC annually.      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  A1c today 9.4% and urine ALB 30.  A1c trending upwards.  At this time had at length discussion of stopping [place] and starting Tresiba 40 units and Trulicity 2.35 MG, which discussed with CCM PharmD previous visit.  She is agreeable to this change.  Goal in long run would be to increase Trulicity to max dose if needed and stop Januvia.  If elevation in A1C ongoing may need to consider referral to endocrinology for further guidance.  Can not take ACE or ARB due to history of  angioedema.  Recommend she continue to monitor BS at home TID and document for visits -- would benefit from CGM in future but would need a lot of education on this and concern about smart phone availability to check.  Return in 4 weeks to check BS with med changes.  Reached out to CCM team to assist in reaching out to patient in one week about changes and if any needs -- may require PA for Tyler Aas, will provide this if needed as not meeting goal with [facility].      Relevant Medications   empagliflozin (JARDIANCE) 25 MG TABS tablet   Dulaglutide (TRULICITY) 3.61 WE/3.1VQ SOPN   insulin degludec (TRESIBA FLEXTOUCH) 100 UNIT/ML FlexTouch Pen   Other Relevant Orders   Bayer DCA Hb A1c Waived (Completed)   Microalbumin, Urine Waived (Completed)   Ambulatory referral to Ophthalmology   Hyperlipidemia associated with type 2 diabetes mellitus (HCC)    Chronic, ongoing.  Continue current medication regimen and adjust as needed.  Lipid panel today.         Relevant Medications   empagliflozin (JARDIANCE) 25 MG TABS tablet   Dulaglutide (TRULICITY) 0.08 QP/6.1PJ SOPN   insulin degludec (TRESIBA FLEXTOUCH) 100 UNIT/ML FlexTouch Pen   Other Relevant Orders   Bayer DCA Hb A1c Waived (Completed)   Comprehensive metabolic panel   Lipid Panel w/o Chol/HDL Ratio     Musculoskeletal and Integument   Osteopenia    Noted on past imaging 2017, will order DEXA today and recommend she schedule this with mammogram in July yo reassess per guidelines, has been 5 years.  Continue Vit D, check level today.      Relevant Orders   DG Bone Density     Genitourinary   CKD (chronic kidney  disease) stage 3, GFR 30-59 ml/min (HCC)    GFR improved October at 60.  Recheck BMP and urine ALB today, would benefit from ACE or ARB with her diabetes, but has history of angioedema with Benazepril.  Consider nephrology in future if decline in function.         Other   Morbid obesity (HCC)    BMI 36.10 with T2DM, HTN/HLD.   Recommended eating smaller high protein, low fat meals more frequently and exercising 30 mins a day 5 times a week with a goal of 10-15lb weight loss in the next 3 months. Patient voiced their understanding and motivation to adhere to these recommendations.       Relevant Medications   empagliflozin (JARDIANCE) 25 MG TABS tablet   Dulaglutide (TRULICITY) 7.35 HG/9.9ME SOPN   insulin degludec (TRESIBA FLEXTOUCH) 100 UNIT/ML FlexTouch Pen   Vitamin D deficiency    Noted on October labs.  Recommend she continue supplement and will check level today.      Relevant Orders   VITAMIN D 25 Hydroxy (Vit-D Deficiency, Fractures)   Vitamin B12 deficiency    Level 369 on October labs, low normal.  Continue supplement and check level today.      Relevant Orders   Vitamin B12    Other Visit Diagnoses    Annual physical exam       Annual labs today to include CBC, CMP, TSH, lipid       Follow up plan: Return in about 4 weeks (around 02/02/2021) for T2DM -- medication changes on 01/05/21 -- meet new PCP.   LABORATORY TESTING:  - Pap smear: not applicable  IMMUNIZATIONS:   - Tdap: Tetanus vaccination status reviewed: last tetanus booster within 10 years. - Influenza: Up to date - Pneumovax: Up to date - Prevnar: Up to date - HPV: Not applicable - Zostavax vaccine: Up to date  SCREENING: -Mammogram: Up to date  - Colonoscopy: Up to date  - Bone Density: Up to date  -- osteopenia on scan 03/27/16 -Hearing Test: Not applicable  -Spirometry: Not applicable   PATIENT COUNSELING:   Advised to take 1 mg of folate supplement per day if capable of pregnancy.   Sexuality: Discussed sexually transmitted diseases, partner selection, use of condoms, avoidance of unintended pregnancy  and contraceptive alternatives.   Advised to avoid cigarette smoking.  I discussed with the patient that most people either abstain from alcohol or drink within safe limits (<=14/week and <=4 drinks/occasion for  males, <=7/weeks and <= 3 drinks/occasion for females) and that the risk for alcohol disorders and other health effects rises proportionally with the number of drinks per week and how often a drinker exceeds daily limits.  Discussed cessation/primary prevention of drug use and availability of treatment for abuse.   Diet: Encouraged to adjust caloric intake to maintain  or achieve ideal body weight, to reduce intake of dietary saturated fat and total fat, to limit sodium intake by avoiding high sodium foods and not adding table salt, and to maintain adequate dietary potassium and calcium preferably from fresh fruits, vegetables, and low-fat dairy products.    Stressed the importance of regular exercise  Injury prevention: Discussed safety belts, safety helmets, smoke detector, smoking near bedding or upholstery.   Dental health: Discussed importance of regular tooth brushing, flossing, and dental visits.    NEXT PREVENTATIVE PHYSICAL DUE IN 1 YEAR. Return in about 4 weeks (around 02/02/2021) for T2DM -- medication changes on 01/05/21 -- meet new PCP.

## 2021-01-05 NOTE — Assessment & Plan Note (Signed)
Level 369 on October labs, low normal.  Continue supplement and check level today.

## 2021-01-05 NOTE — Assessment & Plan Note (Signed)
GFR improved October at 60.  Recheck BMP and urine ALB today, would benefit from ACE or ARB with her diabetes, but has history of angioedema with Benazepril.  Consider nephrology in future if decline in function.

## 2021-01-05 NOTE — Assessment & Plan Note (Signed)
Noted on October labs.  Recommend she continue supplement and will check level today.

## 2021-01-05 NOTE — Assessment & Plan Note (Signed)
Noted on past imaging 2017, will order DEXA today and recommend she schedule this with mammogram in July yo reassess per guidelines, has been 5 years.  Continue Vit D, check level today.

## 2021-01-05 NOTE — Assessment & Plan Note (Signed)
Chronic, ongoing.  A1c today 9.4% and urine ALB 30.  A1c trending upwards.  At this time had at length discussion of stopping [place] and starting Tresiba 40 units and Trulicity 4.65 MG, which discussed with CCM PharmD previous visit.  She is agreeable to this change.  Goal in long run would be to increase Trulicity to max dose if needed and stop Januvia.  If elevation in A1C ongoing may need to consider referral to endocrinology for further guidance.  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  Recommend she continue to monitor BS at home TID and document for visits -- would benefit from CGM in future but would need a lot of education on this and concern about smart phone availability to check.  Return in 4 weeks to check BS with med changes.  Reached out to CCM team to assist in reaching out to patient in one week about changes and if any needs -- may require PA for Tyler Aas, will provide this if needed as not meeting goal with [facility].

## 2021-01-05 NOTE — Assessment & Plan Note (Signed)
Chronic, ongoing.  BP at goal today on recheck.  Continue current medication regimen and adjust as needed.  Would benefit from ACE or ARB, but has history of angioedema with Benazepril so unable to add.  Recommend she monitor BP at home a few days a week and document + focus on DASH diet at home.  Obtain CMP and TSH today.  Return to office in 3 months for HTN visit.

## 2021-01-05 NOTE — Assessment & Plan Note (Signed)
BMI 36.10 with T2DM, HTN/HLD.  Recommended eating smaller high protein, low fat meals more frequently and exercising 30 mins a day 5 times a week with a goal of 10-15lb weight loss in the next 3 months. Patient voiced their understanding and motivation to adhere to these recommendations.

## 2021-01-05 NOTE — Assessment & Plan Note (Signed)
Noted on exam, took ASA daily.  Recommend gentle skin cleansing at home and monitor for skin breakdown, if present alert provider immediately.  Obtain CBC annually.

## 2021-01-05 NOTE — Assessment & Plan Note (Signed)
Chronic, ongoing.  Continue current medication regimen and adjust as needed. Lipid panel today. 

## 2021-01-06 ENCOUNTER — Telehealth: Payer: Self-pay | Admitting: Pharmacist

## 2021-01-06 LAB — CBC WITH DIFFERENTIAL/PLATELET
Basophils Absolute: 0.1 10*3/uL (ref 0.0–0.2)
Basos: 1 %
EOS (ABSOLUTE): 0.1 10*3/uL (ref 0.0–0.4)
Eos: 1 %
Hematocrit: 42.7 % (ref 34.0–46.6)
Hemoglobin: 14.1 g/dL (ref 11.1–15.9)
Immature Grans (Abs): 0.1 10*3/uL (ref 0.0–0.1)
Immature Granulocytes: 1 %
Lymphocytes Absolute: 1.8 10*3/uL (ref 0.7–3.1)
Lymphs: 18 %
MCH: 25.6 pg — ABNORMAL LOW (ref 26.6–33.0)
MCHC: 33 g/dL (ref 31.5–35.7)
MCV: 78 fL — ABNORMAL LOW (ref 79–97)
Monocytes Absolute: 0.8 10*3/uL (ref 0.1–0.9)
Monocytes: 9 %
Neutrophils Absolute: 6.8 10*3/uL (ref 1.4–7.0)
Neutrophils: 70 %
Platelets: 224 10*3/uL (ref 150–450)
RBC: 5.51 x10E6/uL — ABNORMAL HIGH (ref 3.77–5.28)
RDW: 14.9 % (ref 11.7–15.4)
WBC: 9.6 10*3/uL (ref 3.4–10.8)

## 2021-01-06 LAB — COMPREHENSIVE METABOLIC PANEL
ALT: 14 IU/L (ref 0–32)
AST: 17 IU/L (ref 0–40)
Albumin/Globulin Ratio: 1.6 (ref 1.2–2.2)
Albumin: 4.1 g/dL (ref 3.7–4.7)
Alkaline Phosphatase: 86 IU/L (ref 44–121)
BUN/Creatinine Ratio: 16 (ref 12–28)
BUN: 15 mg/dL (ref 8–27)
Bilirubin Total: 0.2 mg/dL (ref 0.0–1.2)
CO2: 22 mmol/L (ref 20–29)
Calcium: 9.3 mg/dL (ref 8.7–10.3)
Chloride: 102 mmol/L (ref 96–106)
Creatinine, Ser: 0.91 mg/dL (ref 0.57–1.00)
GFR calc Af Amer: 72 mL/min/{1.73_m2} (ref >59)
GFR calc non Af Amer: 63 mL/min/{1.73_m2} (ref >59)
Globulin, Total: 2.5 g/dL (ref 1.5–4.5)
Glucose: 124 mg/dL — ABNORMAL HIGH (ref 65–99)
Potassium: 4.1 mmol/L (ref 3.5–5.2)
Sodium: 137 mmol/L (ref 134–144)
Total Protein: 6.6 g/dL (ref 6.0–8.5)

## 2021-01-06 LAB — LIPID PANEL W/O CHOL/HDL RATIO
Cholesterol, Total: 127 mg/dL (ref 100–199)
HDL: 37 mg/dL — ABNORMAL LOW (ref >39)
LDL Chol Calc (NIH): 69 mg/dL (ref 0–99)
Triglycerides: 112 mg/dL (ref 0–149)
VLDL Cholesterol Cal: 21 mg/dL (ref 5–40)

## 2021-01-06 LAB — FECAL OCCULT BLOOD, IMMUNOCHEMICAL: IFOBT: NEGATIVE

## 2021-01-06 LAB — TSH: TSH: 1.88 u[IU]/mL (ref 0.450–4.500)

## 2021-01-06 LAB — VITAMIN D 25 HYDROXY (VIT D DEFICIENCY, FRACTURES): Vit D, 25-Hydroxy: 56.7 ng/mL (ref 30.0–100.0)

## 2021-01-06 LAB — VITAMIN B12: Vitamin B-12: 253 pg/mL (ref 232–1245)

## 2021-01-06 NOTE — Progress Notes (Signed)
Please let Tami Cummings know her labs have returned and overall remain stable, however Vitamin B12 level remains on lower side.  I want her to ensure to start taking the Vitamin B12 I sent in every day.  If she has any questions please let me know.  Have a fantastic day!!

## 2021-01-12 ENCOUNTER — Other Ambulatory Visit: Payer: Self-pay | Admitting: Family Medicine

## 2021-01-17 ENCOUNTER — Ambulatory Visit: Payer: Medicare Other | Admitting: Pharmacist

## 2021-01-17 ENCOUNTER — Other Ambulatory Visit: Payer: Self-pay | Admitting: Nurse Practitioner

## 2021-01-17 ENCOUNTER — Telehealth: Payer: Self-pay | Admitting: Pharmacist

## 2021-01-17 MED ORDER — GLUCOSE BLOOD VI STRP: ORAL_STRIP | 12 refills | Status: DC

## 2021-01-17 MED ORDER — ONETOUCH VERIO W/DEVICE KIT: PACK | 0 refills | Status: DC

## 2021-01-17 MED ORDER — ONETOUCH ULTRASOFT LANCETS MISC: 12 refills | Status: DC

## 2021-01-17 NOTE — Patient Instructions (Signed)
Visit Information  It was a pleasure speaking with you today. Thank you for letting me be part of your clinical team. Please call with any questions or concerns.   Goals Addressed            This Visit's Progress   . Pharmacy care plan       CARE PLAN ENTRY (see longitudinal plan of care for additional care plan information)  Current Barriers:  . Chronic Disease Management support, education, and care coordination needs related to Hypertension, Hyperlipidemia, and Diabetes   Hypertension BP Readings from Last 3 Encounters:  01/05/21 126/68  10/15/20 117/74  04/30/20 (!) 143/80   . Pharmacist Clinical Goal(s): o Over the next 60 days, patient will work with PharmD and providers to achieve BP goal <130/80 . Current regimen:  . Amlodipine 10 mg qd . Atenolol 100mg  qhs  . Spironolactone 25 mg qd . Interventions: o Provided diet and exercise counseling o Discussed importance of home BP monitorien . Patient self care activities - Over the next 60 days, patient will: o Check BP 3-4 times weekly, document, and provide at future appointments o Ensure daily salt intake < 2300 mg/day o Obtain new BP cuff  Hyperlipidemia Lab Results  Component Value Date/Time   LDLCALC 69 01/05/2021 11:14 AM   . Pharmacist Clinical Goal(s): o Over the next 60 days, patient will work with PharmD and providers to achieve LDL goal < 70 . Current regimen:  o Lovastatin 40 mg qd o Aspirin 81 mg qd  Interventions: o Reviewed risk vs benefit of asa therapy and new guidelines o Discussed high intensity statin therapy recommended in DM . Patient self care activities - Over the next 60 days, patient will: o Schedule follow up MD appt o Strive for structured exercise program  Diabetes Lab Results  Component Value Date/Time   HGBA1C 9.4 (H) 01/05/2021 11:12 AM   HGBA1C 9.1 (H) 10/15/2020 04:05 PM   HGBA1C 8.9 (H) 05/28/2020 10:41 AM   HGBA1C 7.8 (H) 12/04/2018 11:06 AM   HGBA1C 7.9 07/13/2016  12:00 AM   . Pharmacist Clinical Goal(s): o Over the next 60 days, patient will work with PharmD and providers to achieve A1c goal <7% . Current regimen:  . Jardiance 25 mg qd . Januvia 50 mg qd . Lispro 25 u ac meal bid .  Tresiba 40 u qpm . Trulicity 5.46 mg qweek . Interventions: o Reviewed fill history in computer o Reviewed goal A1c and fasting BG. Encouraged patient to check BG prior to 6AM dose of Lispro. Reviewed onset of activity 5-15 minutes intended for coverage meal efffect on BG . Patient self care activities - Over the next 60 days, patient will: o Check blood sugar 3-4 times daily and in the morning before eating or drinking,after eating, document, and provide at future appointments o Contact provider with any episodes of hypoglycemia  Medication management . Pharmacist Clinical Goal(s): o Over the next 60 days, patient will work with PharmD and providers to achieve optimal medication adherence . Current pharmacy: [facility] . Interventions o Comprehensive medication review performed. o Continue current medication management strategy . Patient self care activities - Over the next 60 days, patient will: o Focus on medication adherence by fill dates o Take medications as prescribed o Report any questions or concerns to PharmD and/or provider(s)  Please see past updates related to this goal by clicking on the "Past Updates" button in the selected goal  The patient verbalized understanding of instructions, educational materials, and care plan provided today and agreed to receive a mailed copy of patient instructions, educational materials, and care plan.   Telephone follow up appointment with pharmacy team member scheduled for: 2 months  Junita Push. Kenton Kingfisher PharmD, [place] Clinical Pharmacist [phone]

## 2021-01-17 NOTE — Chronic Care Management (AMB) (Signed)
Chronic Care Management Pharmacy  Name: Tami Cummings  MRN: [id number] DOB: [birth year]   Chief Complaint/ HPI  Tami Cummings,  75 y.o. , female presents for her Follow-Up CCM visit with the clinical pharmacist via telephone.Tami Guarneri, DNP requested CCM team follow-up regarding medication changes on 01/05/21.  PCP : Tami Billings, NP Patient Care Team: Tami Billings, NP as PCP - General Tami Parkinson, MD (Ophthalmology) Tami Ingles, RN as [facility] ([place]) Tami Cummings, [facility] as Pharmacist (Pharmacist)  Patient's chronic conditions include: Hypertension, Hyperlipidemia, Diabetes, Chronic Kidney Disease, Osteopenia and Vitamin deficiencies   Office Visits: 1/19./22- Tami Guarneri, DNP- d/c soliqua, add tresiba and trulicity    Subjective: I'm doing okay.  Objective: Allergies  Allergen Reactions  . Actos [Pioglitazone] Other (See Comments)    CHF  . Amlodipine Swelling    Leg swelling   . Benazepril Other (See Comments)    angioedema  . Hydrochlorothiazide Other (See Comments)    angioedema  . Metformin And Related Diarrhea    Medications: Outpatient Encounter Medications as of 01/17/2021  Medication Sig  . amLODipine (NORVASC) 10 MG tablet Take 1 tablet (10 mg total) by mouth daily.  Marland Kitchen atenolol (TENORMIN) 100 MG tablet Take 1 tablet (100 mg total) by mouth daily.  . Cholecalciferol 1.25 MG (50000 UT) TABS Take 1 tablet by mouth once a week.  . Dulaglutide (TRULICITY) 0.86 VH/8.4ON SOPN Inject 0.75 mg into the skin once a week.  . empagliflozin (JARDIANCE) 25 MG TABS tablet TAKE 1 TABLET BY MOUTH ONCE DAILY BEFORE BREAKFAST  . insulin degludec (TRESIBA FLEXTOUCH) 100 UNIT/ML FlexTouch Pen Inject 40 Units into the skin daily.  . insulin lispro (HUMALOG KWIKPEN) 100 UNIT/ML KwikPen Inject 0.25 mLs (25 Units total) into the skin 2 (two) times daily before a meal.  . JANUVIA 50 MG tablet Take 1  tablet by mouth once daily  . lovastatin (MEVACOR) 40 MG tablet Take 1 tablet (40 mg total) by mouth daily with breakfast.  . spironolactone (ALDACTONE) 25 MG tablet Take 1 tablet (25 mg total) by mouth daily.  . vitamin B-12 (CYANOCOBALAMIN) 1000 MCG tablet Take 1 tablet (1,000 mcg total) by mouth daily.  . Blood Glucose Monitoring Suppl (ONE TOUCH ULTRA 2) w/Device KIT USE ONCE TO TWICE DAILY (Patient not taking: Reported on 01/17/2021)  . glucose blood (ONE TOUCH ULTRA TEST) test strip USE ONE TO TWO STRIPS TO CHECK GLUCOSE ONCE DAILY (Patient not taking: Reported on 01/17/2021)  . Insulin Pen Needle (BD PEN NEEDLE MICRO U/F) 32G X 6 MM MISC USE 4 TO 5 TIMES DAILY WITH INSULIN USE (Patient not taking: Reported on 01/17/2021)   No facility-administered encounter medications on file as of 01/17/2021.    Wt Readings from Last 3 Encounters:  01/05/21 203 lb 12.8 oz (92.4 kg)  10/15/20 205 lb 9.6 oz (93.3 kg)  01/29/20 204 lb (92.5 kg)    Lab Results  Component Value Date   CREATININE 0.91 01/05/2021   BUN 15 01/05/2021   GFRNONAA 63 01/05/2021   GFRAA 72 01/05/2021   NA 137 01/05/2021   K 4.1 01/05/2021   CALCIUM 9.3 01/05/2021   CO2 22 01/05/2021     Current Diagnosis/Assessment:    Goals Addressed            This Visit's Progress   . Pharmacy care plan       CARE PLAN ENTRY (see longitudinal plan of care for additional care plan  information)  Current Barriers:  . Chronic Disease Management support, education, and care coordination needs related to Hypertension, Hyperlipidemia, and Diabetes   Hypertension BP Readings from Last 3 Encounters:  01/05/21 126/68  10/15/20 117/74  04/30/20 (!) 143/80   . Pharmacist Clinical Goal(s): o Over the next 60 days, patient will work with PharmD and providers to achieve BP goal <130/80 . Current regimen:  . Amlodipine 10 mg qd . Atenolol 169m qhs  . Spironolactone 25 mg qd . Interventions: o Provided diet and exercise  counseling o Discussed importance of home BP monitorien . Patient self care activities - Over the next 60 days, patient will: o Check BP 3-4 times weekly, document, and provide at future appointments o Ensure daily salt intake < 2300 mg/day o Obtain new BP cuff  Hyperlipidemia Lab Results  Component Value Date/Time   LDLCALC 69 01/05/2021 11:14 AM   . Pharmacist Clinical Goal(s): o Over the next 60 days, patient will work with PharmD and providers to achieve LDL goal < 70 . Current regimen:  o Lovastatin 40 mg qd o Aspirin 81 mg qd  Interventions: o Reviewed risk vs benefit of asa therapy and new guidelines o Discussed high intensity statin therapy recommended in DM . Patient self care activities - Over the next 60 days, patient will: o Schedule follow up MD appt o Strive for structured exercise program  Diabetes Lab Results  Component Value Date/Time   HGBA1C 9.4 (H) 01/05/2021 11:12 AM   HGBA1C 9.1 (H) 10/15/2020 04:05 PM   HGBA1C 8.9 (H) 05/28/2020 10:41 AM   HGBA1C 7.8 (H) 12/04/2018 11:06 AM   HGBA1C 7.9 07/13/2016 12:00 AM   . Pharmacist Clinical Goal(s): o Over the next 60 days, patient will work with PharmD and providers to achieve A1c goal <7% . Current regimen:  . Jardiance 25 mg qd . Januvia 50 mg qd . Lispro 25 u ac meal bid .  Tresiba 40 u qpm . Trulicity 03.27mg qweek . Interventions: o Reviewed fill history in computer o Reviewed goal A1c and fasting BG. Encouraged patient to check BG prior to 6AM dose of Lispro. Reviewed onset of activity 5-15 minutes intended for coverage meal efffect on BG . Patient self care activities - Over the next 60 days, patient will: o Check blood sugar 3-4 times daily and in the morning before eating or drinking,after eating, document, and provide at future appointments o Contact provider with any episodes of hypoglycemia  Medication management . Pharmacist Clinical Goal(s): o Over the next 60 days, patient will work with  PharmD and providers to achieve optimal medication adherence . Current pharmacy: [facility]. Interventions o Comprehensive medication review performed. o Continue current medication management strategy . Patient self care activities - Over the next 60 days, patient will: o Focus on medication adherence by fill dates o Take medications as prescribed o Report any questions or concerns to PharmD and/or provider(s)  Please see past updates related to this goal by clicking on the "Past Updates" button in the selected goal         Diabetes   A1c goal <7%  Recent Relevant Labs: Lab Results  Component Value Date/Time   HGBA1C 9.4 (H) 01/05/2021 11:12 AM   HGBA1C 9.1 (H) 10/15/2020 04:05 PM   HGBA1C 8.9 (H) 05/28/2020 10:41 AM   HGBA1C 7.8 (H) 12/04/2018 11:06 AM   HGBA1C 7.9 07/13/2016 12:00 AM   MICROALBUR 30 (H) 01/05/2021 11:12 AM   MICROALBUR 10 10/15/2020  02:59 PM    Last diabetic Eye exam:  Lab Results  Component Value Date/Time   HMDIABEYEEXA No Retinopathy 04/01/2018 12:00 AM    Last diabetic Foot exam: No results found for: HMDIABFOOTEX   Checking BG: Not checking   Patient has failed these meds in past: [place] Patient is currently uncontrolled on the following medications: . Tresiba 40 units qpm . Novolog 25 u bid with a meal . Jardiance 25 mg qd . Januvia 50 mg qd . Trulicity 5.37 mg qweek  We discussed: Patient picked up and started taking [place]  And Trulicity. She has discontinued [place] and asked what she should do with the remaining. Advised patient per the [facility] website [facility] [place] has a permanent medication disposal. She reports taking Trulicity dose today and on 01/06/21. Counseled patient this med is to be taken on the same day each week? We discussed writing the date to take on each box or putting on a calendar. She is tolerating well per her report. Patient is not checking BG at home reporting her meter is not working. She has not checked in over a  month. Will collaborate with prescriber to send RX for new meter. She has Medicare and Medicaid and should qualify for meter and supplies at no charge. Recommend she started checking as soon as possible. She is not skipping meals.  Plan  Continue current medications  Hypertension   BP goal is:  <130/80  Office blood pressures are  BP Readings from Last 3 Encounters:  01/05/21 126/68  10/15/20 117/74  04/30/20 (!) 143/80   Patient checks BP at home 1-2x per week Patient home BP readings are ranging: 126/97, 128/66, 154/67  Patient has failed these meds in the past: Benazepril -angioedema Patient is currently controlled on the following medications:  . Amlodipine 10 mg qd . Atenolol 100 mg qd . Spironolactone 25 mg qd  We discussed : Patient checks BP at home 1-2 times weekly and documents. She denies any missed doses. She states her meter is acting funny. Will collaborate with PCP to send new RX for BP monitor.  Plan  Continue current medications       Medication Management   Patient's preferred pharmacy is:  [facility] [address] [place] [address] Phone: [phone] Fax: [phone]  We discussed: Current pharmacy is preferred with insurance plan and patient is satisfied with pharmacy services  Plan  Continue current medication management strategy    Follow up: 2  month phone visit  Junita Push. Kenton Kingfisher PharmD, [place] [facility] [phone]

## 2021-01-17 NOTE — Progress Notes (Signed)
    Chronic Care Management Pharmacy Assistant   Name: Tami Cummings  MRN: [id number] DOB: 1946-02-07  Reason for Encounter: Patient Assistance Applications.     PCP : Jon Billings, NP  Allergies:   Allergies  Allergen Reactions  . Actos [Pioglitazone] Other (See Comments)    CHF  . Amlodipine Swelling    Leg swelling   . Benazepril Other (See Comments)    angioedema  . Hydrochlorothiazide Other (See Comments)    angioedema  . Metformin And Related Diarrhea    Medications: Outpatient Encounter Medications as of 01/06/2021  Medication Sig  . amLODipine (NORVASC) 10 MG tablet Take 1 tablet (10 mg total) by mouth daily.  Marland Kitchen atenolol (TENORMIN) 100 MG tablet Take 1 tablet (100 mg total) by mouth daily.  . Blood Glucose Monitoring Suppl (ONE TOUCH ULTRA 2) w/Device KIT USE ONCE TO TWICE DAILY  . Cholecalciferol 1.25 MG (50000 UT) TABS Take 1 tablet by mouth once a week.  . Dulaglutide (TRULICITY) 0.03 KJ/1.7HX SOPN Inject 0.75 mg into the skin once a week.  . empagliflozin (JARDIANCE) 25 MG TABS tablet TAKE 1 TABLET BY MOUTH ONCE DAILY BEFORE BREAKFAST  . glucose blood (ONE TOUCH ULTRA TEST) test strip USE ONE TO TWO STRIPS TO CHECK GLUCOSE ONCE DAILY  . insulin degludec (TRESIBA FLEXTOUCH) 100 UNIT/ML FlexTouch Pen Inject 40 Units into the skin daily.  . insulin lispro (HUMALOG KWIKPEN) 100 UNIT/ML KwikPen Inject 0.25 mLs (25 Units total) into the skin 2 (two) times daily before a meal.  . JANUVIA 50 MG tablet Take 1 tablet by mouth once daily  . lovastatin (MEVACOR) 40 MG tablet Take 1 tablet (40 mg total) by mouth daily with breakfast.  . spironolactone (ALDACTONE) 25 MG tablet Take 1 tablet (25 mg total) by mouth daily.  . vitamin B-12 (CYANOCOBALAMIN) 1000 MCG tablet Take 1 tablet (1,000 mcg total) by mouth daily.  . [DISCONTINUED] Insulin Pen Needle (BD PEN NEEDLE MICRO U/F) 32G X 6 MM MISC USE 4 TO 5 DAILY WITH INSULIN USE   No facility-administered encounter  medications on file as of 01/06/2021.    Current Diagnosis: Patient Active Problem List   Diagnosis Date Noted  . Osteopenia 01/01/2021  . Vitamin D deficiency 10/18/2020  . Vitamin B12 deficiency 10/18/2020  . CKD (chronic kidney disease) stage 3, GFR 30-59 ml/min (HCC) 10/08/2020  . Senile purpura ([place]) 04/10/2017  . Morbid obesity ([place]) 04/10/2017  . Advance care planning 04/10/2017  . Noncompliance 10/30/2016  . DM type 2 with diabetic peripheral neuropathy ([place])   . Hyperlipidemia associated with type 2 diabetes mellitus ([place])   . Hypertension associated with diabetes ([place])     01-06-2021: Patient assistance applications prefilled for [place] and Trulicity.   01-11-2021: Both applications printed, highlighted and flagged for patient to complete and typed instructions were provided to the patient. Mailed the applications.   Cloretta Ned, LPN Clinical Pharmacist Assistant  [phone]    Follow-Up:  Will follow up with the patient the following week.

## 2021-01-17 NOTE — Progress Notes (Signed)
    Chronic Care Management Pharmacy Assistant   Name: Tanasha Menees  MRN: [id number] DOB: May 24, 1946  Reason for Encounter: [place]    PCP : Jon Billings, NP  Allergies:   Allergies  Allergen Reactions  . Actos [Pioglitazone] Other (See Comments)    CHF  . Amlodipine Swelling    Leg swelling   . Benazepril Other (See Comments)    angioedema  . Hydrochlorothiazide Other (See Comments)    angioedema  . Metformin And Related Diarrhea    Medications: Outpatient Encounter Medications as of 01/03/2021  Medication Sig  . Blood Glucose Monitoring Suppl (ONE TOUCH ULTRA 2) w/Device KIT USE ONCE TO TWICE DAILY  . glucose blood (ONE TOUCH ULTRA TEST) test strip USE ONE TO TWO STRIPS TO CHECK GLUCOSE ONCE DAILY  . insulin lispro (HUMALOG KWIKPEN) 100 UNIT/ML KwikPen Inject 0.25 mLs (25 Units total) into the skin 2 (two) times daily before a meal.  . JANUVIA 50 MG tablet Take 1 tablet by mouth once daily  . lovastatin (MEVACOR) 40 MG tablet Take 1 tablet (40 mg total) by mouth daily with breakfast.  . [DISCONTINUED] amLODipine (NORVASC) 10 MG tablet Take 1 tablet by mouth once daily  . [DISCONTINUED] aspirin EC 81 MG tablet Take 81 mg by mouth daily. am (Patient not taking: No sig reported)  . [DISCONTINUED] atenolol (TENORMIN) 100 MG tablet Take 1 tablet (100 mg total) by mouth daily. (Patient taking differently: Take 100 mg by mouth daily. Takes in the evening)  . [DISCONTINUED] Cholecalciferol 1.25 MG (50000 UT) TABS Take 1 tablet by mouth once a week. For 8 weeks and then stop.  Return to office for lab draw. (Patient not taking: Reported on 01/05/2021)  . [DISCONTINUED] Insulin Glargine-Lixisenatide (SOLIQUA) 100-33 UNT-MCG/ML SOPN Inject 60 Units into the skin daily. Start 30 u increase 2 u every 2 days as needed max 60u a day  . [DISCONTINUED] Insulin Pen Needle (BD PEN NEEDLE MICRO U/F) 32G X 6 MM MISC USE 4 TO 5 DAILY WITH INSULIN USE  . [DISCONTINUED]  JARDIANCE 25 MG TABS tablet TAKE 1 TABLET BY MOUTH ONCE DAILY BEFORE BREAKFAST  . [DISCONTINUED] spironolactone (ALDACTONE) 25 MG tablet Take 1 tablet (25 mg total) by mouth daily.  . [DISCONTINUED] vitamin B-12 (CYANOCOBALAMIN) 1000 MCG tablet Take 1 tablet (1,000 mcg total) by mouth daily. (Patient not taking: No sig reported)   No facility-administered encounter medications on file as of 01/03/2021.    Current Diagnosis: Patient Active Problem List   Diagnosis Date Noted  . Osteopenia 01/01/2021  . Vitamin D deficiency 10/18/2020  . Vitamin B12 deficiency 10/18/2020  . CKD (chronic kidney disease) stage 3, GFR 30-59 ml/min (HCC) 10/08/2020  . Senile purpura ([place]) 04/10/2017  . Morbid obesity ([place]) 04/10/2017  . Advance care planning 04/10/2017  . Noncompliance 10/30/2016  . DM type 2 with diabetic peripheral neuropathy ([place])   . Hyperlipidemia associated with type 2 diabetes mellitus ([place])   . Hypertension associated with diabetes ([place])     01-03-2021: Attempted to contact the patient after her recent visit with PharmD Birdena Crandall. No answer. Left HIPAA compliant voicemail requesting a call back.   Cloretta Ned, LPN Clinical Pharmacist Assistant  [phone]    Follow-Up:  Pharmacist Review

## 2021-01-17 NOTE — Telephone Encounter (Signed)
Opened in error

## 2021-01-19 ENCOUNTER — Telehealth: Payer: Self-pay | Admitting: Nurse Practitioner

## 2021-01-19 MED ORDER — ONETOUCH VERIO W/DEVICE KIT: PACK | 0 refills | Status: DC

## 2021-01-19 MED ORDER — ONETOUCH ULTRASOFT LANCETS MISC: 12 refills | Status: DC

## 2021-01-19 MED ORDER — GLUCOSE BLOOD VI STRP: ORAL_STRIP | 12 refills | Status: DC

## 2021-01-19 NOTE — Telephone Encounter (Signed)
New meter and supplies sent to the pharmacy for patient.

## 2021-01-19 NOTE — Telephone Encounter (Signed)
-----   Message from Jerene Pitch, [place] sent at 01/19/2021 10:14 AM EST ----- Order written, placed for signature ----- Message ----- From: Vladimir Faster, [facility] Sent: 01/17/2021   5:48 PM EST To: Venita Lick, NP, [facility] Clinical  Ellyn is not checking her BG at home due to her glucometer malfunctioning.  Would you please send a new RX for preferred glucometer and supplies to [facility]?  She is taking her Trulicity and [place] as prescribed and discontinued [place].

## 2021-02-02 ENCOUNTER — Ambulatory Visit: Payer: Medicare Other | Admitting: Nurse Practitioner

## 2021-02-04 ENCOUNTER — Other Ambulatory Visit: Payer: Self-pay

## 2021-02-04 ENCOUNTER — Encounter: Payer: Self-pay | Admitting: Nurse Practitioner

## 2021-02-04 ENCOUNTER — Ambulatory Visit (INDEPENDENT_AMBULATORY_CARE_PROVIDER_SITE_OTHER): Payer: Medicare Other | Admitting: Nurse Practitioner

## 2021-02-04 VITALS — BP 129/66 | HR 73 | Temp 98.1°F | Wt 204.4 lb

## 2021-02-04 DIAGNOSIS — E538 Deficiency of other specified B group vitamins: Secondary | ICD-10-CM

## 2021-02-04 DIAGNOSIS — E1159 Type 2 diabetes mellitus with other circulatory complications: Secondary | ICD-10-CM | POA: Diagnosis not present

## 2021-02-04 DIAGNOSIS — E1169 Type 2 diabetes mellitus with other specified complication: Secondary | ICD-10-CM | POA: Diagnosis not present

## 2021-02-04 DIAGNOSIS — E1142 Type 2 diabetes mellitus with diabetic polyneuropathy: Secondary | ICD-10-CM | POA: Diagnosis not present

## 2021-02-04 DIAGNOSIS — I152 Hypertension secondary to endocrine disorders: Secondary | ICD-10-CM

## 2021-02-04 DIAGNOSIS — E785 Hyperlipidemia, unspecified: Secondary | ICD-10-CM

## 2021-02-04 MED ORDER — GLUCOSE BLOOD VI STRP: ORAL_STRIP | 12 refills | Status: DC

## 2021-02-04 MED ORDER — TRULICITY 1.5 MG/0.5ML ~~LOC~~ SOAJ: 1.5000 mg | SUBCUTANEOUS | 3 refills | Status: DC

## 2021-02-04 MED ORDER — ONETOUCH VERIO W/DEVICE KIT
PACK | 0 refills | Status: DC
Start: 2021-02-04 — End: 2022-06-12

## 2021-02-04 NOTE — Progress Notes (Signed)
BP 129/66 (BP Location: Left Arm, Cuff Size: Normal)   Pulse 73   Temp 98.1 F (36.7 C) (Oral)   Wt 204 lb 6.4 oz (92.7 kg)   SpO2 97%   BMI 36.21 kg/m    Subjective:    [name], female    DOB: [birth year], 75 y.o.   MRN: [id number]  HPI: Tami Cummings is a 75 y.o. female  Chief Complaint  Patient presents with  . Diabetes    4 week f/up   DIABETES Patient was seen 1 month ago and A1c was 9.4.  Patient was started on Tresiba and Trulicity.  Patient is here today to discuss blood sugars and have medications titrated. Patient states she is using the [place] daily and tolerating the Trulicity once weekly.   Hypoglycemic episodes:no Polydipsia/polyuria: no Visual disturbance: no Chest pain: no Paresthesias: no Glucose Monitoring: yes  Accucheck frequency: a couple times a week.  Patient's machine hasn't been working properly.  Fasting glucose: 120-140  Post prandial:  Evening:  Before meals: Taking Insulin?: yes  Long acting insulin:  Short acting insulin: Blood Pressure Monitoring: Blood pressure cuff is about 74 years old and not working right. Retinal Examination: Scheduled for May. Foot Exam: Up to Date Diabetic Education: Not Completed Pneumovax: Up to Date Influenza: Up to Date Aspirin: no  Relevant past medical, surgical, family and social history reviewed and updated as indicated. Interim medical history since our last visit reviewed. Allergies and medications reviewed and updated.  Review of Systems  Eyes: Negative for visual disturbance.  Cardiovascular: Negative for chest pain.  Endocrine: Negative for polydipsia and polyuria.  Neurological: Negative for numbness.    Per HPI unless specifically indicated above     Objective:    BP 129/66 (BP Location: Left Arm, Cuff Size: Normal)   Pulse 73   Temp 98.1 F (36.7 C) (Oral)   Wt 204 lb 6.4 oz (92.7 kg)   SpO2 97%   BMI 36.21 kg/m   Wt Readings from Last 3  Encounters:  02/04/21 204 lb 6.4 oz (92.7 kg)  01/05/21 203 lb 12.8 oz (92.4 kg)  10/15/20 205 lb 9.6 oz (93.3 kg)    Physical Exam Vitals and nursing note reviewed.  Constitutional:      General: She is not in acute distress.    Appearance: Normal appearance. She is normal weight. She is not ill-appearing, toxic-appearing or diaphoretic.  HENT:     Head: Normocephalic.     Right Ear: External ear normal.     Left Ear: External ear normal.     Nose: Nose normal.     Mouth/Throat:     Mouth: Mucous membranes are moist.     Pharynx: Oropharynx is clear.  Eyes:     General:        Right eye: No discharge.        Left eye: No discharge.     Extraocular Movements: Extraocular movements intact.     Conjunctiva/sclera: Conjunctivae normal.     Pupils: Pupils are equal, round, and reactive to light.  Cardiovascular:     Rate and Rhythm: Normal rate and regular rhythm.     Heart sounds: No murmur heard.   Pulmonary:     Effort: Pulmonary effort is normal. No respiratory distress.     Breath sounds: Normal breath sounds. No wheezing or rales.  Musculoskeletal:     Cervical back: Normal range of motion and neck supple.  Skin:  General: Skin is warm and dry.     Capillary Refill: Capillary refill takes less than 2 seconds.  Neurological:     General: No focal deficit present.     Mental Status: She is alert and oriented to person, place, and time. Mental status is at baseline.  Psychiatric:        Mood and Affect: Mood normal.        Behavior: Behavior normal.        Thought Content: Thought content normal.        Judgment: Judgment normal.     Results for orders placed or performed in visit on 01/31/21  Fecal occult blood, imunochemical  Result Value Ref Range   IFOBT Negative       Assessment & Plan:   Problem List Items Addressed This Visit      Endocrine   DM type 2 with diabetic peripheral neuropathy ([place]) - Primary   Relevant Medications   Dulaglutide  (TRULICITY) 1.5 YN/1.8ZF SOPN   Blood Glucose Monitoring Suppl (ONETOUCH VERIO) w/Device KIT   glucose blood test strip   Hyperlipidemia associated with type 2 diabetes mellitus ([facility])   Relevant Medications   Dulaglutide (TRULICITY) 1.5 PO/2.5PG SOPN     Other   Vitamin B12 deficiency       Follow up plan: Return in about 2 months (around 04/04/2021) for DM check.  A total of 30 minutes were spent on this encounter today.  When total time is documented, this includes both the face-to-face and non-face-to-face time personally spent before, during and after the visit on the date of the encounter.

## 2021-02-04 NOTE — Assessment & Plan Note (Signed)
Chronic, ongoing.  Patient has not been checking blood pressure due to her meter not working well.  New meter sent to [facility] for patient to pick up.  Follow up in 2 months.

## 2021-02-04 NOTE — Assessment & Plan Note (Signed)
Chronic, ongoing.  Last A1c 1 month ago was 9.4%.  Patient was started on [place] which she is tolerating well.  Blood sugars are running 120-140s but her meter is not working great.  New meter and strips sent to the pharmacy for patient.  Trulicity increased to 1.5mg  once weekly.  Patient should follow up in 2 months for reevaluation and bring blood sugars at that time.  Goal in long run would be to increase Trulicity to max dose if needed and stop Januvia.  If elevation in A1C ongoing may need to consider referral to endocrinology for further guidance.  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  Recommend she continue to monitor BS at home TID and document for visits.

## 2021-02-04 NOTE — Assessment & Plan Note (Signed)
Ongoing.  Patient has not picked up B12 that was sent for her at last appointment.  Advised patient to pick this up from [facility].  Will reassess in 2 months.

## 2021-02-04 NOTE — Patient Instructions (Signed)
Please pick up from [facility]: Increased dose of Trulicity. Vitamin B12. New glucose monitor. New strips. New blood pressure cuff.

## 2021-02-09 ENCOUNTER — Telehealth: Payer: Self-pay | Admitting: General Practice

## 2021-02-09 ENCOUNTER — Ambulatory Visit (INDEPENDENT_AMBULATORY_CARE_PROVIDER_SITE_OTHER): Payer: Medicare Other | Admitting: General Practice

## 2021-02-09 ENCOUNTER — Telehealth: Payer: Self-pay | Admitting: Nurse Practitioner

## 2021-02-09 DIAGNOSIS — E1169 Type 2 diabetes mellitus with other specified complication: Secondary | ICD-10-CM | POA: Diagnosis not present

## 2021-02-09 DIAGNOSIS — E1142 Type 2 diabetes mellitus with diabetic polyneuropathy: Secondary | ICD-10-CM

## 2021-02-09 DIAGNOSIS — E785 Hyperlipidemia, unspecified: Secondary | ICD-10-CM

## 2021-02-09 DIAGNOSIS — I152 Hypertension secondary to endocrine disorders: Secondary | ICD-10-CM

## 2021-02-09 DIAGNOSIS — E1159 Type 2 diabetes mellitus with other circulatory complications: Secondary | ICD-10-CM | POA: Diagnosis not present

## 2021-02-09 MED ORDER — ONETOUCH DELICA LANCETS 33G MISC: 1.0000 | Freq: Two times a day (BID) | 1 refills | Status: DC

## 2021-02-09 NOTE — Telephone Encounter (Signed)
New lancets sent to the pharmacy.

## 2021-02-09 NOTE — Telephone Encounter (Signed)
-----   Message from Vladimir Faster, [facility] sent at 02/09/2021 12:44 PM EST ----- Apologies for the new message but I don't have capabilities to reply to the previous.  I agree, if she comes in we can show her how to use it. I noticed in her profile One touch ultra soft lancets were dispensed. It is my understanding that the [facility] lansing device comes with the Verio meter and is only compatible with one touch Delica or Delica Plus lancets. Most new meter kits only come with 10 compatible lancets.  Santiago Glad, would you please send a new prescription? Thank you.

## 2021-02-09 NOTE — Patient Instructions (Signed)
Visit Information  PATIENT GOALS: Goals Addressed            This Visit's Progress   . COMPLETED: RNCM: I don't take my blood pressure every day and I take my sugars when I think about it       Current Barriers: closing this goal and opening in new ELS . Chronic Disease Management support, education, and care coordination needs related to HTN, HLD, and DMII  Clinical Goal(s) related to HTN, HLD, and DMII:  Over the next 120 days, patient will:  . Work with the care management team to address educational, disease management, and care coordination needs  . Begin or continue self health monitoring activities as directed today Measure and record cbg (blood glucose) BID times daily and Measure and record blood pressure 4 times per week, work toward keeping a log of readings, and work toward increasing activity level . Call provider office for new or worsened signs and symptoms Blood glucose findings outside established parameters, Blood pressure findings outside established parameters, and New or worsened symptom related to HTN, Dm2, and HLD . Call care management team with questions or concerns . Verbalize basic understanding of patient centered plan of care established today  Interventions related to HTN, HLD, and DMII:  . Evaluation of current treatment plans and patient's adherence to plan as established by provider.  The patient endorses compliance with medications and plan of care. 12-08-2020: The patient states that she feels fine and is doing well. Staying busy with her grandchildren.  . Assessed patient understanding of disease states.  The patient has a good understanding of her chronic conditions.  The patient endorses checking blood sugars. 12-08-2020: The patient is not taking her blood sugars consistently. The patient states she is "too busy" when having 2 grand kids around. Education on the importance of checking blood sugars consistently.  Education on the impact of uncontrolled DM  on kidneys, eyes and other organ systems. The patient states her blood sugar last night at dinner was 147.  Education on fasting blood sugars or <130 and post prandial <180.  The patient states that she will try to take her blood sugars more but feels fine. The most recent hemoglobin A1C was 9.1 on 10-15-2020. Review of hemoglobin A1c being 7.0 or less for optimal management.  . Assessed patient's education and care coordination needs.  Education on the benefits of a heart healthy/ADA diet. The patient states she only adds salt to some of her food. The patient denies using a lot of sodium. The patient states that her weakness is "sweets".  Education on Fiber one options for satisfying her sweet cravings. The patient will check into this. Education and support given. 12-08-2020: the patient states she is trying to be more mindful of her diet. She does not eat a lot. The patient is enjoying some of the fruits and vegetables, she eats a lot of "country food". Education on portion control, decreased sugars, sodium and fats. The patient denies any issues related to dietary restrictions. Review of watching sweets around the holidays and the patient states that she is watching sweets and not eating a lot.  . Provided disease specific education to patient: Educated on the benefits of monitoring blood sugars as prescribed and taking blood pressure weekly. The patient states she gets busy and forgets.  12-08-2020: The patient states she has not been taking her blood pressure a lot but when she does it is really good. The patient denies any headache or  other issues related to elevated blood pressures.  Nash Dimmer with appropriate clinical care team members regarding patient needs.  The patient currently works with the CCM pharmacist as needed and knows the LCSW is available for resources.  . Evaluation of the patients compliance of writing down blood sugars and blood pressures on the log provided by the pharmacist in  January 2021. Encouraged the patient to start using the log daily. The patient is now writing her blood pressure down.  The patient says she is still not taking her blood sugars daily. She had not taken this am but yesterday am was 151. She forgets as she is busy taking care of her grandchildren. Education on the importance of taking her blood sugars daily BID as prescribed and recording.  Patient states that she has 2 grandchildren that keep her busy. 12-08-2020: the patient is not writing down her readings but can give the RNCM some past readings, the patient has to be reminded frequently on outreaches to check blood sugars, blood pressures, and increase activity to help with management of her chronic conditions. The patient stays busy with her grandchildren and forgets a lot of times to take readings.  Education on care of self so she can continue to care for her grandchildren.  The patient states she feels well and is happy. Reminders during outreaches to take as recommended and write down.  . Evaluation of the patients dietary habits. The patients daughter cooks meals for the patient. The patient states "I don't use much salt".  The member does eat fried foods and processed foods at times, education on Heart Healthy/ADA diet . Discussed working toward a structured exercise plan.  The patient states she has a "bad knee" and doesn't get out much. 12-08-2020: does take her granddaughters outside and walks and plays with them in the yard. Can't do a lot due to her "bad leg". Encouraged pacing activity.  . Evaluation of medication compliance with Januvia.  The patient endorses compliance with Januvia.  12-08-2020: Review of medications and any new concerns. The patient states that she is not having issues with her medications. The patient did ask if she could stop taking low dose aspirin. The RNCM ask the patient to discuss this with the pcp and the CCM pharmacist. The patient sees the pharmacist on 12-13-2020 at  4 pm.  Reminded the patient to write down questions to ask the pharmacist at her appointment.  Also advised the patient to bring her medications to the appointment. Will send an in basket message to the pharmacist concerning the question the patient had about taking aspirin.   . Evaluation of upcoming appointments: Next appointment with pcp on 12-21-2020 at 0900.  Review of face to face appointment with the pharmacist on 12-13-2020 at 4 pm   Patient Self Care Activities related to HTN, HLD, and DMII:  . Patient is unable to independently self-manage chronic health conditions  Please see past updates related to this goal by clicking on the "Past Updates" button in the selected goal      . RNCM: Monitor and Manage My Blood Sugar-Diabetes Type 2       Timeframe:  Short-Term Goal Priority:  High Start Date: 02-09-2021                             Expected End Date:    06-16-2021  Follow Up Date 04-06-2021 0945 am   - check blood sugar at prescribed times - check blood sugar before and after exercise - check blood sugar if I feel it is too high or too low - enter blood sugar readings and medication or insulin into daily log - take the blood sugar log to all doctor visits   -take meter to the pharmacy and have pharmacy show how to use. The patient is having a hard time with the glucose meter and using. Education and support given.   Why is this important?    Checking your blood sugar at home helps to keep it from getting very high or very low.   Writing the results in a diary or log helps the doctor know how to care for you.   Your blood sugar log should have the time, date and the results.   Also, write down the amount of insulin or other medicine that you take.   Other information, like what you ate, exercise done and how you were feeling, will also be helpful.     Notes: Extensive education today on making sure she is checking her blood sugars at home. Has not been doing  this. Is not familiar with the meter and how to use. Encouraged her to take to the pharmacy today or try to get her daughter to help  her with using the meter.       Patient Care Plan: RNCM: Diabetes Type 2 (Adult)    Problem Identified: RNCM: Glycemic Management (Diabetes, Type 2)   Priority: High    Goal: RNCM: Glycemic Management Optimized   Note:   Objective:  Lab Results  Component Value Date   HGBA1C 9.4 (H) 01/05/2021 .   Lab Results  Component Value Date   CREATININE 0.91 01/05/2021   CREATININE 0.95 10/15/2020   CREATININE 1.07 (H) 05/28/2020 .   Marland Kitchen No results found for: EGFR Current Barriers:  Marland Kitchen Knowledge Deficits related to basic Diabetes pathophysiology and self care/management . Knowledge Deficits related to medications used for management of diabetes . Literacy barriers . Does not use cbg meter  . Limited Social Support . Non-compliance  . Unable to self administer medications as prescribed . Does not attend all scheduled provider appointments . Does not adhere to prescribed medication regimen . Lacks social connections . Does not maintain contact with provider office . Does not contact provider office for questions/concerns Case Manager Clinical Goal(s):  . patient will demonstrate improved adherence to prescribed treatment plan for diabetes self care/management as evidenced by: daily monitoring and recording of CBG  adherence to ADA/ carb modified diet exercise 3/4 days/week adherence to prescribed medication regimen contacting provider for new or worsened symptoms or questions Interventions:  . Collaboration with Jon Billings, NP regarding development and update of comprehensive plan of care as evidenced by provider attestation and co-signature . Inter-disciplinary care team collaboration (see longitudinal plan of care) . Provided education to patient about basic DM disease process . Reviewed medications with patient and discussed importance of  medication adherence . Discussed plans with patient for ongoing care management follow up and provided patient with direct contact information for care management team . Provided patient with written educational materials related to hypo and hyperglycemia and importance of correct treatment. Reviewed with the patient sx/sx to look for with hypo/hyperglycemia. The patient denies any issues and says she feels "great"  . Reviewed scheduled/upcoming provider appointments including: 04-04-2021 . Advised patient, providing education and rationale, to check  cbg BID and record, calling pcp for findings outside established parameters.  The patient has not checked her blood sugars because she does not know how to work the meter. Trouble shooting attempted. The patient is going to see if her daughter can help her figure it out, or she will take it back to the pharmacy and get them to help her review how to use. Will touch base with pham D also to see if any recommendations. Expressed the importance of blood sugar checks especially since there has been a change in her medications. The patient verbalized understanding. Goal for blood sugars: fasting <130 and post prandial <180.  Marland Kitchen Review of patient status, including review of consultants reports, relevant laboratory and other test results, and medications completed. Patient Goals: - barriers to adherence to treatment plan identified - blood glucose monitoring encouraged - blood glucose readings reviewed - individualized medical nutrition therapy provided - mutual A1C goal set or reviewed - resources required to improve adherence to care identified - self-awareness of signs/symptoms of hypo or hyperglycemia encouraged - use of blood glucose monitoring log promoted Self-Care Activities - UNABLE to independently manage DM Attends all scheduled provider appointments Checks blood sugars as prescribed and utilize hyper and hypoglycemia protocol as needed Adheres to  prescribed ADA/carb modified Follow Up Plan: Telephone follow up appointment with care management team member scheduled for: 04-06-2021 at 0945 am   Task: RNCM: Alleviate Barriers to Glycemic Management   Note:   Care Management Activities:    - barriers to adherence to treatment plan identified - blood glucose monitoring encouraged - blood glucose readings reviewed - individualized medical nutrition therapy provided - mutual A1C goal set or reviewed - resources required to improve adherence to care identified - self-awareness of signs/symptoms of hypo or hyperglycemia encouraged - use of blood glucose monitoring log promoted       Patient Care Plan: RNCM: Hypertension (Adult)    Problem Identified: RNCM: Hypertension (Hypertension)   Priority: Medium    Long-Range Goal: RNCM: Hypertension Monitored   Priority: Medium  Note:   Objective:  . Last practice recorded BP readings:  BP Readings from Last 3 Encounters:  02/04/21 129/66  01/05/21 126/68  10/15/20 117/74 .   Marland Kitchen Most recent eGFR/CrCl: No results found for: EGFR  No components found for: CRCL Current Barriers:  Marland Kitchen Knowledge Deficits related to basic understanding of hypertension pathophysiology and self care management . Knowledge Deficits related to understanding of medications prescribed for management of hypertension . Literacy barriers . Limited Social Support . Unable to independently manage HTN . Unable to self administer medications as prescribed . Does not adhere to prescribed medication regimen . Lacks social connections . Does not maintain contact with provider office . Does not contact provider office for questions/concerns Case Manager Clinical Goal(s):  Marland Kitchen Over the next 120 days, patient will verbalize understanding of plan for hypertension management . Over the next 120 days, patient will attend all scheduled medical appointments: 04-04-2021 . Over the next 120 days, patient will demonstrate improved  adherence to prescribed treatment plan for hypertension as evidenced by taking all medications as prescribed, monitoring and recording blood pressure as directed, adhering to low sodium/DASH diet . Over the next 120 days, patient will demonstrate improved health management independence as evidenced by checking blood pressure as directed and notifying PCP if SBP>160 or DBP > 90, taking all medications as prescribe, and adhering to a low sodium diet as discussed. . Over the next 120 days, patient  will verbalize basic understanding of hypertension disease process and self health management plan as evidenced by medications compliance, dietary compliance, and working with the CCM team to optimize health and well being Interventions:  . Collaboration with Jon Billings, NP regarding development and update of comprehensive plan of care as evidenced by provider attestation and co-signature . Inter-disciplinary care team collaboration (see longitudinal plan of care) . Evaluation of current treatment plan related to hypertension self management and patient's adherence to plan as established by provider. . Provided education to patient re: stroke prevention, s/s of heart attack and stroke, DASH diet, complications of uncontrolled blood pressure . Reviewed medications with patient and discussed importance of compliance . Discussed plans with patient for ongoing care management follow up and provided patient with direct contact information for care management team . Advised patient, providing education and rationale, to monitor blood pressure daily and record, calling PCP for findings outside established parameters.  . Reviewed scheduled/upcoming provider appointments including: 04-04-2021 Patient Goals:  blood pressure equipment and technique reviewed - blood pressure trends reviewed - depression screen reviewed - home or ambulatory blood pressure monitoring encouraged    Notes: The patient states she can  not find the cuff end to her blood pressure cuff. The patient advised to use OTC benefit through [facility] to get new cuff. The patient will check into this  Self-Care Activities: - Self administers medications as prescribed Attends all scheduled provider appointments Calls provider office for new concerns, questions, or BP outside discussed parameters Checks BP and records as discussed Follows a low sodium diet/DASH diet Follow Up Plan: Telephone follow up appointment with care management team member scheduled for: 04-06-2021 at 0945 am   Task: RNCM: Identify and Monitor Blood Pressure Elevation   Note:   Care Management Activities:    - blood pressure equipment and technique reviewed - blood pressure trends reviewed - depression screen reviewed - home or ambulatory blood pressure monitoring encouraged    Notes: The patient states she can not find the cuff end to her blood pressure cuff. The patient advised to use OTC benefit through [facility] to get new cuff. The patient will check into this    Patient Care Plan: RNCM: HLD Management    Problem Identified: RNCM: Management of HLD   Priority: Medium    Long-Range Goal: RNCM: HLD Management   Priority: Medium  Note:   Current Barriers:  . Poorly controlled hyperlipidemia, complicated by uncontrolled DM . Current antihyperlipidemic regimen: Lovastatin 40 mg QD . Most recent lipid panel:     Component Value Date/Time   CHOL 127 01/05/2021 1114   CHOL 137 12/04/2018 1106   TRIG 112 01/05/2021 1114   TRIG 262 (H) 12/04/2018 1106   HDL 37 (L) 01/05/2021 1114   CHOLHDL 4.0 04/10/2017 1347   VLDL 52 (H) 12/04/2018 1106   LDLCALC 69 01/05/2021 1114 .   Marland Kitchen ASCVD risk enhancing conditions: age >14, DM, HTN, CKD, former smoker . Unable to self administer medications as prescribed . Does not attend all scheduled provider appointments . Does not adhere to prescribed medication regimen . Lacks social connections . Does not contact provider office  for questions/concerns  RN Care Manager Clinical Goal(s):  Marland Kitchen Over the next 120 days, patient will work with Consulting civil engineer, providers, and care team towards execution of optimized self-health management plan . patient will verbalize understanding of plan for effective management of HLD  . patient will work with [facility] and pcp  to address needs  related to HLD  . patient will attend all scheduled medical appointments: 04-04-2021  Interventions: . Collaboration with Jon Billings, NP regarding development and update of comprehensive plan of care as evidenced by provider attestation and co-signature . Inter-disciplinary care team collaboration (see longitudinal plan of care) . Medication review performed; medication list updated in electronic medical record.  Bertram Savin care team collaboration (see longitudinal plan of care) . Referred to pharmacy team for assistance with HLD medication management . Evaluation of current treatment plan related to HLD  and patient's adherence to plan as established by provider. . Advised patient to call the office for changes or questions  . Provided education to patient re: heart healthy diet, medications compliance, working with the CCM team  . Reviewed medications with patient and discussed compliance, the patient endorses taking medications as directed . Reviewed scheduled/upcoming provider appointments including: 04-04-2021 . Discussed plans with patient for ongoing care management follow up and provided patient with direct contact information for care management team   Patient Goals/Self-Care Activities: . Over the next 120 days, patient will:   - call for medicine refill 2 or 3 days before it runs out - call if I am sick and can't take my medicine - keep a list of all the medicines I take; vitamins and herbals too - learn to read medicine labels - use a pillbox to sort medicine - use an alarm clock or phone to remind me to take my  medicine - change to whole grain breads, cereal, pasta - drink 6 to 8 glasses of water each day - eat 3 to 5 servings of fruits and vegetables each day - eat 5 or 6 small meals each day - fill half the plate with nonstarchy vegetables - limit fast food meals to no more than 1 per week - manage portion size - prepare main meal at home 3 to 5 days each week - read food labels for fat, fiber, carbohydrates and portion size - be open to making changes - I can manage, know and watch for signs of a heart attack - if I have chest pain, call for help - learn about small changes that will make a big difference - learn my personal risk factors   Follow Up Plan: Telephone follow up appointment with care management team member scheduled for: 04-06-2021 at 0945 am       The patient verbalized understanding of instructions, educational materials, and care plan provided today and declined offer to receive copy of patient instructions, educational materials, and care plan.   Telephone follow up appointment with care management team member scheduled for: 04-06-2021 at [place] am  Noreene Larsson RN, MSN, [facility] Mobile: [phone]

## 2021-02-09 NOTE — Chronic Care Management (AMB) (Signed)
Chronic Care Management   CCM RN Visit Note  02/09/2021 Name: Tami Cummings MRN: [id number] DOB: [birth year]  Subjective: Tami Cummings is a 75 y.o. year old female who is a primary care patient of Jon Billings, NP. The care management team was consulted for assistance with disease management and care coordination needs.    Engaged with patient by telephone for follow up visit in response to provider referral for case management and/or care coordination services.   Consent to Services:  The patient was given information about Chronic Care Management services, agreed to services, and gave verbal consent prior to initiation of services.  Please see initial visit note for detailed documentation.   Patient agreed to services and verbal consent obtained.   Assessment: Review of patient past medical history, allergies, medications, health status, including review of consultants reports, laboratory and other test data, was performed as part of comprehensive evaluation and provision of chronic care management services.   SDOH (Social Determinants of Health) assessments and interventions performed:    CCM Care Plan  Allergies  Allergen Reactions  . Actos [Pioglitazone] Other (See Comments)    CHF  . Amlodipine Swelling    Leg swelling   . Benazepril Other (See Comments)    angioedema  . Hydrochlorothiazide Other (See Comments)    angioedema  . Metformin And Related Diarrhea    Outpatient Encounter Medications as of 02/09/2021  Medication Sig  . amLODipine (NORVASC) 10 MG tablet Take 1 tablet (10 mg total) by mouth daily.  Marland Kitchen atenolol (TENORMIN) 100 MG tablet Take 1 tablet (100 mg total) by mouth daily.  . Blood Glucose Monitoring Suppl (ONETOUCH VERIO) w/Device KIT Use to check blood sugar 2-3 times daily and document, bring results to provider visits.  . Cholecalciferol 1.25 MG (50000 UT) TABS Take 1 tablet by mouth once a week.  . Dulaglutide (TRULICITY) 1.5  CW/8.8QB SOPN Inject 1.5 mg into the skin once a week.  . empagliflozin (JARDIANCE) 25 MG TABS tablet TAKE 1 TABLET BY MOUTH ONCE DAILY BEFORE BREAKFAST  . glucose blood test strip Use to check blood sugar 2-3 times daily and document, bring results to provider visits.  . insulin degludec (TRESIBA FLEXTOUCH) 100 UNIT/ML FlexTouch Pen Inject 40 Units into the skin daily.  . insulin lispro (HUMALOG KWIKPEN) 100 UNIT/ML KwikPen Inject 0.25 mLs (25 Units total) into the skin 2 (two) times daily before a meal.  . Insulin Pen Needle (BD PEN NEEDLE MICRO U/F) 32G X 6 MM MISC USE 4 TO 5 TIMES DAILY WITH INSULIN USE  . JANUVIA 50 MG tablet Take 1 tablet by mouth once daily  . Lancets (ONETOUCH ULTRASOFT) lancets Use to check blood sugar 2-3 times daily and document, bring results to provider visits.  Marland Kitchen lovastatin (MEVACOR) 40 MG tablet Take 1 tablet (40 mg total) by mouth daily with breakfast.  . spironolactone (ALDACTONE) 25 MG tablet Take 1 tablet (25 mg total) by mouth daily.  . vitamin B-12 (CYANOCOBALAMIN) 1000 MCG tablet Take 1 tablet (1,000 mcg total) by mouth daily.   No facility-administered encounter medications on file as of 02/09/2021.    Patient Active Problem List   Diagnosis Date Noted  . Osteopenia 01/01/2021  . Vitamin D deficiency 10/18/2020  . Vitamin B12 deficiency 10/18/2020  . CKD (chronic kidney disease) stage 3, GFR 30-59 ml/min (HCC) 10/08/2020  . Senile purpura ([place]) 04/10/2017  . Morbid obesity ([place]) 04/10/2017  . Advance care planning 04/10/2017  . Noncompliance 10/30/2016  .  DM type 2 with diabetic peripheral neuropathy ([place])   . Hyperlipidemia associated with type 2 diabetes mellitus ([place])   . Hypertension associated with diabetes ([place])     Conditions to be addressed/monitored:HTN, HLD and DMII  Care Plan : RNCM: Diabetes Type 2 (Adult)  Updates made by Vanita Ingles since 02/09/2021 12:00 AM    Problem: RNCM: Glycemic Management (Diabetes, Type 2)   Priority:  High    Goal: RNCM: Glycemic Management Optimized   Note:   Objective:  Lab Results  Component Value Date   HGBA1C 9.4 (H) 01/05/2021 .   Lab Results  Component Value Date   CREATININE 0.91 01/05/2021   CREATININE 0.95 10/15/2020   CREATININE 1.07 (H) 05/28/2020 .   Marland Kitchen No results found for: EGFR Current Barriers:  Marland Kitchen Knowledge Deficits related to basic Diabetes pathophysiology and self care/management . Knowledge Deficits related to medications used for management of diabetes . Literacy barriers . Does not use cbg meter  . Limited Social Support . Non-compliance  . Unable to self administer medications as prescribed . Does not attend all scheduled provider appointments . Does not adhere to prescribed medication regimen . Lacks social connections . Does not maintain contact with provider office . Does not contact provider office for questions/concerns Case Manager Clinical Goal(s):  . patient will demonstrate improved adherence to prescribed treatment plan for diabetes self care/management as evidenced by: daily monitoring and recording of CBG  adherence to ADA/ carb modified diet exercise 3/4 days/week adherence to prescribed medication regimen contacting provider for new or worsened symptoms or questions Interventions:  . Collaboration with Jon Billings, NP regarding development and update of comprehensive plan of care as evidenced by provider attestation and co-signature . Inter-disciplinary care team collaboration (see longitudinal plan of care) . Provided education to patient about basic DM disease process . Reviewed medications with patient and discussed importance of medication adherence . Discussed plans with patient for ongoing care management follow up and provided patient with direct contact information for care management team . Provided patient with written educational materials related to hypo and hyperglycemia and importance of correct treatment. Reviewed with  the patient sx/sx to look for with hypo/hyperglycemia. The patient denies any issues and says she feels "great"  . Reviewed scheduled/upcoming provider appointments including: 04-04-2021 . Advised patient, providing education and rationale, to check cbg BID and record, calling pcp for findings outside established parameters.  The patient has not checked her blood sugars because she does not know how to work the meter. Trouble shooting attempted. The patient is going to see if her daughter can help her figure it out, or she will take it back to the pharmacy and get them to help her review how to use. Will touch base with pham D also to see if any recommendations. Expressed the importance of blood sugar checks especially since there has been a change in her medications. The patient verbalized understanding. Goal for blood sugars: fasting <130 and post prandial <180.  Marland Kitchen Review of patient status, including review of consultants reports, relevant laboratory and other test results, and medications completed. Patient Goals: - barriers to adherence to treatment plan identified - blood glucose monitoring encouraged - blood glucose readings reviewed - individualized medical nutrition therapy provided - mutual A1C goal set or reviewed - resources required to improve adherence to care identified - self-awareness of signs/symptoms of hypo or hyperglycemia encouraged - use of blood glucose monitoring log promoted Self-Care Activities - UNABLE to independently manage  DM Attends all scheduled provider appointments Checks blood sugars as prescribed and utilize hyper and hypoglycemia protocol as needed Adheres to prescribed ADA/carb modified Follow Up Plan: Telephone follow up appointment with care management team member scheduled for: 04-06-2021 at 0945 am   Task: RNCM: Alleviate Barriers to Glycemic Management   Note:   Care Management Activities:    - barriers to adherence to treatment plan identified -  blood glucose monitoring encouraged - blood glucose readings reviewed - individualized medical nutrition therapy provided - mutual A1C goal set or reviewed - resources required to improve adherence to care identified - self-awareness of signs/symptoms of hypo or hyperglycemia encouraged - use of blood glucose monitoring log promoted       Care Plan : RNCM: Hypertension (Adult)  Updates made by Vanita Ingles since 02/09/2021 12:00 AM    Problem: RNCM: Hypertension (Hypertension)   Priority: Medium    Long-Range Goal: RNCM: Hypertension Monitored   Priority: Medium  Note:   Objective:  . Last practice recorded BP readings:  BP Readings from Last 3 Encounters:  02/04/21 129/66  01/05/21 126/68  10/15/20 117/74 .   Marland Kitchen Most recent eGFR/CrCl: No results found for: EGFR  No components found for: CRCL Current Barriers:  Marland Kitchen Knowledge Deficits related to basic understanding of hypertension pathophysiology and self care management . Knowledge Deficits related to understanding of medications prescribed for management of hypertension . Literacy barriers . Limited Social Support . Unable to independently manage HTN . Unable to self administer medications as prescribed . Does not adhere to prescribed medication regimen . Lacks social connections . Does not maintain contact with provider office . Does not contact provider office for questions/concerns Case Manager Clinical Goal(s):  Marland Kitchen Over the next 120 days, patient will verbalize understanding of plan for hypertension management . Over the next 120 days, patient will attend all scheduled medical appointments: 04-04-2021 . Over the next 120 days, patient will demonstrate improved adherence to prescribed treatment plan for hypertension as evidenced by taking all medications as prescribed, monitoring and recording blood pressure as directed, adhering to low sodium/DASH diet . Over the next 120 days, patient will demonstrate improved health  management independence as evidenced by checking blood pressure as directed and notifying PCP if SBP>160 or DBP > 90, taking all medications as prescribe, and adhering to a low sodium diet as discussed. . Over the next 120 days, patient will verbalize basic understanding of hypertension disease process and self health management plan as evidenced by medications compliance, dietary compliance, and working with the CCM team to optimize health and well being Interventions:  . Collaboration with Jon Billings, NP regarding development and update of comprehensive plan of care as evidenced by provider attestation and co-signature . Inter-disciplinary care team collaboration (see longitudinal plan of care) . Evaluation of current treatment plan related to hypertension self management and patient's adherence to plan as established by provider. . Provided education to patient re: stroke prevention, s/s of heart attack and stroke, DASH diet, complications of uncontrolled blood pressure . Reviewed medications with patient and discussed importance of compliance . Discussed plans with patient for ongoing care management follow up and provided patient with direct contact information for care management team . Advised patient, providing education and rationale, to monitor blood pressure daily and record, calling PCP for findings outside established parameters.  . Reviewed scheduled/upcoming provider appointments including: 04-04-2021 Patient Goals:  blood pressure equipment and technique reviewed - blood pressure trends reviewed - depression screen reviewed -  home or ambulatory blood pressure monitoring encouraged    Notes: The patient states she can not find the cuff end to her blood pressure cuff. The patient advised to use OTC benefit through [facility] to get new cuff. The patient will check into this  Self-Care Activities: - Self administers medications as prescribed Attends all scheduled provider  appointments Calls provider office for new concerns, questions, or BP outside discussed parameters Checks BP and records as discussed Follows a low sodium diet/DASH diet Follow Up Plan: Telephone follow up appointment with care management team member scheduled for: 04-06-2021 at 0945 am   Task: RNCM: Identify and Monitor Blood Pressure Elevation   Note:   Care Management Activities:    - blood pressure equipment and technique reviewed - blood pressure trends reviewed - depression screen reviewed - home or ambulatory blood pressure monitoring encouraged    Notes: The patient states she can not find the cuff end to her blood pressure cuff. The patient advised to use OTC benefit through [facility] to get new cuff. The patient will check into this    Care Plan : RNCM: HLD Management  Updates made by Vanita Ingles since 02/09/2021 12:00 AM    Problem: RNCM: Management of HLD   Priority: Medium    Long-Range Goal: RNCM: HLD Management   Priority: Medium  Note:   Current Barriers:  . Poorly controlled hyperlipidemia, complicated by uncontrolled DM . Current antihyperlipidemic regimen: Lovastatin 40 mg QD . Most recent lipid panel:     Component Value Date/Time   CHOL 127 01/05/2021 1114   CHOL 137 12/04/2018 1106   TRIG 112 01/05/2021 1114   TRIG 262 (H) 12/04/2018 1106   HDL 37 (L) 01/05/2021 1114   CHOLHDL 4.0 04/10/2017 1347   VLDL 52 (H) 12/04/2018 1106   LDLCALC 69 01/05/2021 1114 .   Marland Kitchen ASCVD risk enhancing conditions: age >42, DM, HTN, CKD, former smoker . Unable to self administer medications as prescribed . Does not attend all scheduled provider appointments . Does not adhere to prescribed medication regimen . Lacks social connections . Does not contact provider office for questions/concerns  RN Care Manager Clinical Goal(s):  Marland Kitchen Over the next 120 days, patient will work with Consulting civil engineer, providers, and care team towards execution of optimized self-health management  plan . patient will verbalize understanding of plan for effective management of HLD  . patient will work with [facility] and pcp  to address needs related to HLD  . patient will attend all scheduled medical appointments: 04-04-2021  Interventions: . Collaboration with Jon Billings, NP regarding development and update of comprehensive plan of care as evidenced by provider attestation and co-signature . Inter-disciplinary care team collaboration (see longitudinal plan of care) . Medication review performed; medication list updated in electronic medical record.  Bertram Savin care team collaboration (see longitudinal plan of care) . Referred to pharmacy team for assistance with HLD medication management . Evaluation of current treatment plan related to HLD  and patient's adherence to plan as established by provider. . Advised patient to call the office for changes or questions  . Provided education to patient re: heart healthy diet, medications compliance, working with the CCM team  . Reviewed medications with patient and discussed compliance, the patient endorses taking medications as directed . Reviewed scheduled/upcoming provider appointments including: 04-04-2021 . Discussed plans with patient for ongoing care management follow up and provided patient with direct contact information for care management team   Patient Goals/Self-Care Activities: .  Over the next 120 days, patient will:   - call for medicine refill 2 or 3 days before it runs out - call if I am sick and can't take my medicine - keep a list of all the medicines I take; vitamins and herbals too - learn to read medicine labels - use a pillbox to sort medicine - use an alarm clock or phone to remind me to take my medicine - change to whole grain breads, cereal, pasta - drink 6 to 8 glasses of water each day - eat 3 to 5 servings of fruits and vegetables each day - eat 5 or 6 small meals each day - fill half the plate with  nonstarchy vegetables - limit fast food meals to no more than 1 per week - manage portion size - prepare main meal at home 3 to 5 days each week - read food labels for fat, fiber, carbohydrates and portion size - be open to making changes - I can manage, know and watch for signs of a heart attack - if I have chest pain, call for help - learn about small changes that will make a big difference - learn my personal risk factors   Follow Up Plan: Telephone follow up appointment with care management team member scheduled for: 04-06-2021 at 0945 am       Plan:Telephone follow up appointment with care management team member scheduled for:  04-06-2021 at [place] am  Noreene Larsson RN, MSN, [facility] Mobile: [phone]

## 2021-03-17 ENCOUNTER — Other Ambulatory Visit: Payer: Self-pay | Admitting: Family Medicine

## 2021-03-22 ENCOUNTER — Telehealth: Payer: Self-pay

## 2021-03-22 NOTE — Progress Notes (Addendum)
Chronic Care Management Pharmacy Assistant   Name: Tami Cummings  MRN: [id number] DOB: [birth year]  Reason for Encounter: Disease State- Diabetes Mellitus Disease State Call  Recent office visits:  02/04/21- Jon Billings, Np- Increased Trulicity to 8.1KG once weekly. New meter and strips Rx written  Recent consult visits:  No recent visits   Hospital visits:  None in previous 6 months  Medications: Outpatient Encounter Medications as of 03/22/2021  Medication Sig   amLODipine (NORVASC) 10 MG tablet Take 1 tablet (10 mg total) by mouth daily.   atenolol (TENORMIN) 100 MG tablet Take 1 tablet (100 mg total) by mouth daily.   BD PEN NEEDLE MICRO U/F 32G X 6 MM MISC USE 4 TO 5 TIMES DAILY WITH INSULIN USE   Blood Glucose Monitoring Suppl (ONETOUCH VERIO) w/Device KIT Use to check blood sugar 2-3 times daily and document, bring results to provider visits.   Cholecalciferol 1.25 MG (50000 UT) TABS Take 1 tablet by mouth once a week.   Dulaglutide (TRULICITY) 1.5 YJ/8.5UD SOPN Inject 1.5 mg into the skin once a week.   empagliflozin (JARDIANCE) 25 MG TABS tablet TAKE 1 TABLET BY MOUTH ONCE DAILY BEFORE BREAKFAST   glucose blood test strip Use to check blood sugar 2-3 times daily and document, bring results to provider visits.   insulin degludec (TRESIBA FLEXTOUCH) 100 UNIT/ML FlexTouch Pen Inject 40 Units into the skin daily.   insulin lispro (HUMALOG KWIKPEN) 100 UNIT/ML KwikPen Inject 0.25 mLs (25 Units total) into the skin 2 (two) times daily before a meal.   JANUVIA 50 MG tablet Take 1 tablet by mouth once daily   Lancets (ONETOUCH ULTRASOFT) lancets Use to check blood sugar 2-3 times daily and document, bring results to provider visits.   lovastatin (MEVACOR) 40 MG tablet Take 1 tablet (40 mg total) by mouth daily with breakfast.   OneTouch Delica Lancets 14H MISC 1 applicator by Does not apply route in the morning and at bedtime.   spironolactone (ALDACTONE) 25 MG tablet  Take 1 tablet (25 mg total) by mouth daily.   vitamin B-12 (CYANOCOBALAMIN) 1000 MCG tablet Take 1 tablet (1,000 mcg total) by mouth daily.   No facility-administered encounter medications on file as of 03/22/2021.   Recent Relevant Labs: Lab Results  Component Value Date/Time   HGBA1C 9.4 (H) 01/05/2021 11:12 AM   HGBA1C 9.1 (H) 10/15/2020 04:05 PM   HGBA1C 8.9 (H) 05/28/2020 10:41 AM   HGBA1C 7.8 (H) 12/04/2018 11:06 AM   HGBA1C 7.9 07/13/2016 12:00 AM   MICROALBUR 30 (H) 01/05/2021 11:12 AM   MICROALBUR 10 10/15/2020 02:59 PM    Kidney Function Lab Results  Component Value Date/Time   CREATININE 0.91 01/05/2021 11:14 AM   CREATININE 0.95 10/15/2020 03:04 PM   GFRNONAA 63 01/05/2021 11:14 AM   GFRAA 72 01/05/2021 11:14 AM    Current antihyperglycemic regimen:  Empagliflozin (JARDIANCE) 25 MG- take one tab before breakfast Januvia 50 mg - take one tab daily  Lispro 25 u- Inject 0.25 mLs into the skin two times daily before a meal in the morning and at 5pm  Tresiba 40 u -Inject 40 Units into the skin daily Trulicity 7.02 mg- Inject 1.5 mg into the skin once a week  What recent interventions/DTPs have been made to improve glycemic control:   02/04/21- Jon Billings, Np- Increased Trulicity to 6.3ZC once weekly. New meter and strips Rx written.   Have there been any recent hospitalizations or ED visits since  last visit with CPP?  No recent hospitalizations  Patient denies hypoglycemic symptoms, including Pale, Sweaty, Shaky, Hungry, Nervous/irritable and Vision changes   Patient denies hyperglycemic symptoms, including blurry vision, excessive thirst, fatigue, polyuria and weakness   How often are you checking your blood sugar? Patient stated she checks her blood sugar every morning before meals   What are your blood sugars ranging?  Fasting:  145 (04/15)  Patient did not have any more readings because she could not pull up the hx on her OneTouch device  Before meals:  N/A After meals: N/A Bedtime: N/A  During the week, how often does your blood glucose drop below 70? Never   Are you checking your feet daily/regularly?  Patient stated she checks her feet regularly and they look good.  Adherence Review: Is the patient currently on a STATIN medication? Yes  Lovastatin 44m- 90DS last filled 02/23/21   Is the patient currently on ACE/ARB medication? No   Does the patient have >5 day gap between last estimated fill dates? Yes  Star Rating Drugs: Dulaglutide( Trulicity) 18.4YK/5.9DJ 28DS last filled 01/06/2021 Empagliflozin (Jardiance) 214m 90DS last filled 01/04/21  Januvia 5047m90DS last filled 12/29/20  Lovastatin 56m58m0DS last filled 02/23/21   Patient stated she had not been taking Trulicity for about 2 months due to not having a rx at pharmacy. Spoke with J. HCecilie KicksP and [facility]ul[id number] to let her know that her prescription would be ready for pick-up soon. She stated that she would go to pick it up as soon as she could.   AshlWilford [employer], CMA  I spoke with Mrs. CarlLucusay and verified she picked up her Trulicity yesterday. Counseled her to take her dose today and every Thursday going forward instead of waiting until Monday since she has not taken in several weeks. She voiced understanding.  She is checking her glucose once daily and reports fasting readings of 125-140 mg/dL. I encouraged her to start checking 2-3 hours dinner daily as well. She has a follow up appointment with me next month.  JuliJunita PushrrKenton KingfisherrmD, BCPS Clinical Pharmacist [facility]-[phone] reviewed the care management and care coordination activities outlined in this encounter and I am certifying that I agree with the content of this note.

## 2021-04-03 NOTE — Progress Notes (Signed)
BP 136/74   Pulse 69   Temp (!) 97.3 F (36.3 C)   Wt 204 lb (92.5 kg)   SpO2 98%   BMI 36.14 kg/m    Subjective:    [name], female    DOB: [birth year], 75 y.o.   MRN: [id number]  HPI: Tami Cummings is a 75 y.o. female  Chief Complaint  Patient presents with  . Diabetes   DIABETES Patient was seen 3 month ago and A1c was 9.4.  Patient was started on Tresiba and Trulicity. Patient is here today to discuss blood sugars and have medications titrated. Patient states she is using the [place] daily and tolerating the Trulicity once weekly.   Hypoglycemic episodes:no Polydipsia/polyuria: no Visual disturbance: no Chest pain: no Paresthesias: no Glucose Monitoring: yes  Accucheck frequency: a couple times a week.  Patient's machine hasn't been working properly.  Fasting glucose: 120-140  Post prandial: 190-200  Evening:  Before meals: Taking Insulin?: yes  Long acting insulin:   Short acting insulin: Humalog 25 Blood Pressure Monitoring: Got a new Blood pressure cuff.  Not checking regularly.  Retinal Examination: Scheduled for May. Foot Exam: Up to Date Diabetic Education: Not Completed Pneumovax: Up to Date Influenza: Up to Date Aspirin: no  Relevant past medical, surgical, family and social history reviewed and updated as indicated. Interim medical history since our last visit reviewed. Allergies and medications reviewed and updated.  Review of Systems  Eyes: Negative for visual disturbance.  Cardiovascular: Negative for chest pain.  Endocrine: Negative for polydipsia and polyuria.  Neurological: Negative for numbness.    Per HPI unless specifically indicated above     Objective:    BP 136/74   Pulse 69   Temp (!) 97.3 F (36.3 C)   Wt 204 lb (92.5 kg)   SpO2 98%   BMI 36.14 kg/m   Wt Readings from Last 3 Encounters:  04/04/21 204 lb (92.5 kg)  02/04/21 204 lb 6.4 oz (92.7 kg)  01/05/21 203 lb 12.8 oz (92.4 kg)     Physical Exam Vitals and nursing note reviewed.  Constitutional:      General: She is not in acute distress.    Appearance: Normal appearance. She is normal weight. She is not ill-appearing, toxic-appearing or diaphoretic.  HENT:     Head: Normocephalic.     Right Ear: External ear normal.     Left Ear: External ear normal.     Nose: Nose normal.     Mouth/Throat:     Mouth: Mucous membranes are moist.     Pharynx: Oropharynx is clear.  Eyes:     General:        Right eye: No discharge.        Left eye: No discharge.     Extraocular Movements: Extraocular movements intact.     Conjunctiva/sclera: Conjunctivae normal.     Pupils: Pupils are equal, round, and reactive to light.  Cardiovascular:     Rate and Rhythm: Normal rate and regular rhythm.     Heart sounds: No murmur heard.   Pulmonary:     Effort: Pulmonary effort is normal. No respiratory distress.     Breath sounds: Normal breath sounds. No wheezing or rales.  Musculoskeletal:     Cervical back: Normal range of motion and neck supple.  Skin:    General: Skin is warm and dry.     Capillary Refill: Capillary refill takes less than 2 seconds.  Neurological:  General: No focal deficit present.     Mental Status: She is alert and oriented to person, place, and time. Mental status is at baseline.  Psychiatric:        Mood and Affect: Mood normal.        Behavior: Behavior normal.        Thought Content: Thought content normal.        Judgment: Judgment normal.     Results for orders placed or performed in visit on 01/31/21  Fecal occult blood, imunochemical  Result Value Ref Range   IFOBT Negative       Assessment & Plan:   Problem List Items Addressed This Visit      Cardiovascular and Mediastinum   Hypertension associated with diabetes ([place])    Chronic, ongoing.  Patient received new blood pressure cuff.  Encouraged to check blood pressures at home and bring log to visit.  Follow up in 3 months.  Labs  ordered today.       Relevant Orders   HgB A1c   Comp Met (CMET)     Endocrine   DM type 2 with diabetic peripheral neuropathy ([place]) - Primary    Chronic.  Uncontrolled.  Last A1c 3 months ago was 9.4.  Labs ordered today.  Will adjust medications as needed based on lab results.   Follow up in 3 months.       Relevant Orders   HgB A1c   Comp Met (CMET)       Follow up plan: Return in about 3 months (around 07/04/2021) for HTN, HLD, DM2 FU.  A total of 30 minutes were spent on this encounter today.  When total time is documented, this includes both the face-to-face and non-face-to-face time personally spent before, during and after the visit on the date of the encounter.

## 2021-04-04 ENCOUNTER — Encounter: Payer: Self-pay | Admitting: Nurse Practitioner

## 2021-04-04 ENCOUNTER — Other Ambulatory Visit: Payer: Self-pay

## 2021-04-04 ENCOUNTER — Ambulatory Visit (INDEPENDENT_AMBULATORY_CARE_PROVIDER_SITE_OTHER): Payer: Medicare Other | Admitting: Nurse Practitioner

## 2021-04-04 VITALS — BP 136/74 | HR 69 | Temp 97.3°F | Wt 204.0 lb

## 2021-04-04 DIAGNOSIS — I152 Hypertension secondary to endocrine disorders: Secondary | ICD-10-CM

## 2021-04-04 DIAGNOSIS — E1142 Type 2 diabetes mellitus with diabetic polyneuropathy: Secondary | ICD-10-CM

## 2021-04-04 DIAGNOSIS — E1159 Type 2 diabetes mellitus with other circulatory complications: Secondary | ICD-10-CM

## 2021-04-04 NOTE — Assessment & Plan Note (Signed)
Chronic.  Uncontrolled.  Last A1c 3 months ago was 9.4.  Labs ordered today.  Will adjust medications as needed based on lab results.   Follow up in 3 months.

## 2021-04-04 NOTE — Assessment & Plan Note (Signed)
Chronic, ongoing.  Patient received new blood pressure cuff.  Encouraged to check blood pressures at home and bring log to visit.  Follow up in 3 months.  Labs ordered today.

## 2021-04-05 LAB — COMPREHENSIVE METABOLIC PANEL
ALT: 20 IU/L (ref 0–32)
AST: 15 IU/L (ref 0–40)
Albumin/Globulin Ratio: 1.7 (ref 1.2–2.2)
Albumin: 4.5 g/dL (ref 3.7–4.7)
Alkaline Phosphatase: 93 IU/L (ref 44–121)
BUN/Creatinine Ratio: 15 (ref 12–28)
BUN: 14 mg/dL (ref 8–27)
Bilirubin Total: 0.4 mg/dL (ref 0.0–1.2)
CO2: 22 mmol/L (ref 20–29)
Calcium: 9.6 mg/dL (ref 8.7–10.3)
Chloride: 100 mmol/L (ref 96–106)
Creatinine, Ser: 0.96 mg/dL (ref 0.57–1.00)
Globulin, Total: 2.6 g/dL (ref 1.5–4.5)
Glucose: 112 mg/dL — ABNORMAL HIGH (ref 65–99)
Potassium: 4.7 mmol/L (ref 3.5–5.2)
Sodium: 140 mmol/L (ref 134–144)
Total Protein: 7.1 g/dL (ref 6.0–8.5)
eGFR: 62 mL/min/{1.73_m2} (ref >59)

## 2021-04-05 LAB — HEMOGLOBIN A1C
Est. average glucose Bld gHb Est-mCnc: 229 mg/dL
Hgb A1c MFr Bld: 9.6 % — ABNORMAL HIGH (ref 4.8–5.6)

## 2021-04-06 ENCOUNTER — Telehealth: Payer: Self-pay

## 2021-04-06 MED ORDER — TRULICITY 1.5 MG/0.5ML ~~LOC~~ SOAJ
1.5000 mg | SUBCUTANEOUS | 3 refills | Status: DC
Start: 2021-04-06 — End: 2021-07-13

## 2021-04-06 NOTE — Addendum Note (Signed)
Addended by: Jon Billings on: 04/06/2021 10:47 AM   Modules accepted: Orders

## 2021-04-06 NOTE — Progress Notes (Signed)
Please let patient know that her A1c increased to 9.6 which is up from 9.1 at last visit.  Please make sure patient is taking [place] 70W daily, Trulicity 1.5mg  weekly, and Humalog 25u twice daily.  Patient was unable to bring her pens to the visit yesterday so she was unsure of which ones she was using.  Please verify that this is her regimen.  If it is, we will increase her Trulicity to 3mg  weekly.  I will send this in the pharmacy once I know for sure she is taking the other ones as prescribed.

## 2021-04-25 ENCOUNTER — Ambulatory Visit (INDEPENDENT_AMBULATORY_CARE_PROVIDER_SITE_OTHER): Payer: Medicare Other | Admitting: Pharmacist

## 2021-04-25 ENCOUNTER — Telehealth: Payer: Self-pay | Admitting: Pharmacist

## 2021-04-25 DIAGNOSIS — E1142 Type 2 diabetes mellitus with diabetic polyneuropathy: Secondary | ICD-10-CM

## 2021-04-25 DIAGNOSIS — I152 Hypertension secondary to endocrine disorders: Secondary | ICD-10-CM

## 2021-04-25 DIAGNOSIS — E1159 Type 2 diabetes mellitus with other circulatory complications: Secondary | ICD-10-CM

## 2021-04-25 NOTE — Progress Notes (Signed)
Chronic Care Management Pharmacy Note  05/12/2021 Name:  Tami Cummings MRN:  [id number] DOB:  22-May-1946  Subjective: Tami Cummings is an 75 y.o. year old female who is a primary patient of Jon Billings, NP.  The CCM team was consulted for assistance with disease management and care coordination needs.    Engaged with patient by telephone for follow up visit in response to provider referral for pharmacy case management and/or care coordination services.   Consent to Services:  The patient was given information about Chronic Care Management services, agreed to services, and gave verbal consent prior to initiation of services.  Please see initial visit note for detailed documentation.   Patient Care Team: Jon Billings, NP as PCP - Tami Mustache, MD (Ophthalmology) Vanita Ingles, RN as [place] [facility] ([place]) Vladimir Faster, [facility] as Pharmacist (Pharmacist)  Recent office visits: 4/18/22Mathis Dad (PCP)- A1c, cmp, ? Increase Trulicity  Recent consult visits: NA  Hospital visits: None in previous 6 months  Objective:  Lab Results  Component Value Date   CREATININE 0.96 04/04/2021   BUN 14 04/04/2021   GFRNONAA 63 01/05/2021   GFRAA 72 01/05/2021   NA 140 04/04/2021   K 4.7 04/04/2021   CALCIUM 9.6 04/04/2021   CO2 22 04/04/2021   GLUCOSE 112 (H) 04/04/2021    Lab Results  Component Value Date/Time   HGBA1C 9.6 (H) 04/04/2021 11:04 AM   HGBA1C 9.4 (H) 01/05/2021 11:12 AM   HGBA1C 9.1 (H) 10/15/2020 04:05 PM   HGBA1C 7.8 (H) 12/04/2018 11:06 AM   HGBA1C 7.9 07/13/2016 12:00 AM   MICROALBUR 30 (H) 01/05/2021 11:12 AM   MICROALBUR 10 10/15/2020 02:59 PM    Last diabetic Eye exam:  Lab Results  Component Value Date/Time   HMDIABEYEEXA No Retinopathy 04/26/2021 12:00 AM    Last diabetic Foot exam: No results found for: HMDIABFOOTEX   Lab Results  Component Value Date   CHOL 127 01/05/2021    HDL 37 (L) 01/05/2021   LDLCALC 69 01/05/2021   TRIG 112 01/05/2021   CHOLHDL 4.0 04/10/2017    Hepatic Function Latest Ref Rng & Units 04/04/2021 01/05/2021 05/28/2020  Total Protein 6.0 - 8.5 g/dL 7.1 6.6 6.5  Albumin 3.7 - 4.7 g/dL 4.5 4.1 4.2  AST 0 - 40 IU/L '15 17 15  ' ALT 0 - 32 IU/L '20 14 20  ' Alk Phosphatase 44 - 121 IU/L 93 86 87  Total Bilirubin 0.0 - 1.2 mg/dL 0.4 0.2 <0.2    Lab Results  Component Value Date/Time   TSH 1.880 01/05/2021 11:14 AM   TSH 2.230 10/15/2020 03:04 PM    CBC Latest Ref Rng & Units 01/05/2021 07/18/2019 05/23/2018  WBC 3.4 - 10.8 x10E3/uL 9.6 10.0 7.7  Hemoglobin 11.1 - 15.9 g/dL 14.1 13.7 13.6  Hematocrit 34.0 - 46.6 % 42.7 42.1 39.3  Platelets 150 - 450 x10E3/uL 224 212 204    Lab Results  Component Value Date/Time   VD25OH 56.7 01/05/2021 11:14 AM   VD25OH 17.9 (L) 10/15/2020 03:04 PM    Clinical ASCVD: No  The ASCVD Risk score Mikey Bussing DC Jr., et al., 2013) failed to calculate for the following reasons:   The valid total cholesterol range is 130 to 320 mg/dL    Depression screen [facility] 2/9 01/05/2021 05/27/2020 01/23/2020  Decreased Interest 0 0 0  Down, Depressed, Hopeless 0 0 0  PHQ - 2 Score 0 0 0  Altered sleeping - - -  Tired, decreased energy - - -  Change in appetite - - -  Feeling bad or failure about yourself  - - -  Trouble concentrating - - -  Moving slowly or fidgety/restless - - -  Suicidal thoughts - - -  PHQ-9 Score - - -      Social History   Tobacco Use  Smoking Status Former Smoker  . Packs/day: 1.00  . Years: 20.00  . Pack years: 20.00  . Types: Cigarettes  . Quit date: 08/26/2013  . Years since quitting: 7.7  Smokeless Tobacco Never Used   BP Readings from Last 3 Encounters:  04/04/21 136/74  02/04/21 129/66  01/05/21 126/68   Pulse Readings from Last 3 Encounters:  04/04/21 69  02/04/21 73  01/05/21 74   Wt Readings from Last 3 Encounters:  04/04/21 204 lb (92.5 kg)  02/04/21 204 lb 6.4 oz (92.7  kg)  01/05/21 203 lb 12.8 oz (92.4 kg)   BMI Readings from Last 3 Encounters:  04/04/21 36.14 kg/m  02/04/21 36.21 kg/m  01/05/21 36.10 kg/m    Assessment/Interventions: Review of patient past medical history, allergies, medications, health status, including review of consultants reports, laboratory and other test data, was performed as part of comprehensive evaluation and provision of chronic care management services.   SDOH:  (Social Determinants of Health) assessments and interventions performed: No  SDOH Screenings   Alcohol Screen: Not on file  Depression (PHQ2-9): Low Risk   . PHQ-2 Score: 0  Financial Resource Strain: Not on file  Food Insecurity: Not on file  Housing: Not on file  Physical Activity: Not on file  Social Connections: Socially Isolated  . Frequency of Communication with Friends and Family: More than three times a week  . Frequency of Social Gatherings with Friends and Family: More than three times a week  . Attends Religious Services: Never  . Active Member of Clubs or Organizations: No  . Attends Archivist Meetings: Never  . Marital Status: Divorced  Stress: Not on file  Tobacco Use: Medium Risk  . Smoking Tobacco Use: Former Smoker  . Smokeless Tobacco Use: Never Used  Transportation Needs: Not on file      Immunization History  Administered Date(s) Administered  . Fluad Quad(high Dose 65+) 09/18/2020  . Influenza, High Dose Seasonal PF 10/30/2016, 10/17/2017, 09/03/2018  . Influenza,inj,Quad PF,6+ Mos 09/27/2015  . Influenza-Unspecified 08/18/2014, 10/17/2017, 10/12/2019  . PFIZER(Purple Top)SARS-COV-2 Vaccination 03/23/2020, 04/13/2020, 12/30/2020  . Pneumococcal Conjugate-13 12/28/2014  . Pneumococcal-Unspecified 12/30/2012  . Td 05/23/2005, 03/14/2016  . Zoster, Live 03/11/2014    Conditions to be addressed/monitored:  Hypertension, Hyperlipidemia, Diabetes, Chronic Kidney Disease and Osteopenia  Care Plan : [place]  Updates made by Vladimir Faster, [place] since 05/12/2021 12:00 AM    Problem: DM2, HTN, HLD, Osteopenia, CKD,   Priority: High    Long-Range Goal: Disease Management   Start Date: 04/25/2021  This Visit's Progress: On track  Priority: High  Note:   Current Barriers:  . Unable to independently monitor therapeutic efficacy . Unable to achieve control of diabetes  . Unable to maintain control of hypertension . Does not adhere to prescribed medication regimen . Does not contact provider office for questions/concerns  Pharmacist Clinical Goal(s):  Marland Kitchen Patient will achieve adherence to monitoring guidelines and medication adherence to achieve therapeutic efficacy . achieve control of diabetes as evidenced by A1c and BG readings . maintain control of blood pressure as evidenced by readings  .  adhere to prescribed medication regimen as evidenced by fill dates, patient report . contact provider office for questions/concerns as evidenced notation of same in electronic health record through collaboration with PharmD and provider.   Interventions: . 1:1 collaboration with Jon Billings, NP regarding development and update of comprehensive plan of care as evidenced by provider attestation and co-signature . Inter-disciplinary care team collaboration (see longitudinal plan of care) . Comprehensive medication review performed; medication list updated in electronic medical record .  BP Readings from Last 3 Encounters:  04/04/21 136/74  02/04/21 129/66  01/05/21 126/68   Hypertension (BP goal <130/80) -Controlled -Current treatment:  Amlodipine 10 mg qd  Atenolol 100 mg qd  Spironolactone 25 mg qd  -Medications previously tried: NA -Current home readings: Has been checking regularly,  Only has one reading of 144/79 --Denies hypotensive/hypertensive symptoms -Educated on BP goals and benefits of medications for prevention of heart attack, stroke and kidney damage; Daily salt  intake goal < 2300 mg; Exercise goal of 150 minutes per week; Importance of home blood pressure monitoring; -Counseled to monitor BP at home 2-3 times weekly, document, and provide log at future appointments -Recommended to continue current medication Recommended patient begin checking BP regularly Educated on proper technique for BP monitoring.  The ASCVD Risk score Mikey Bussing DC Jr., et al., 2013) failed to calculate for the following reasons:   The valid total cholesterol range is 130 to 320 mg/dL Lab Results  Component Value Date   LDLCALC 69 01/05/2021   Hyperlipidemia: (LDL goal < 70) -Controlled -Current treatment: . Lovastatin 40 mg qd -Medications previously tried: na  -Educated on Cholesterol goals;  Benefits of statin for ASCVD risk reduction; Importance of limiting foods high in cholesterol; -Recommended to continue current medication  Lab Results  Component Value Date   HGBA1C 9.6 (H) 04/04/2021    Diabetes (A1c goal <7%) -Uncontrolled -Current medications: . Trulicity 1.5 mg q Monday . Tresiba 40 units qhs . Humalog 25 u bid  . Jardiance 25 mg qd . Januvia 50 mg qd -Medications previously tried: [place] -Current home glucose readings . fasting glucose: 124 . post prandial glucose: 150 (30 -60 min after dinner) -Denies hypoglycemic/hyperglycemic symptoms -Current meal patterns: has stopped eating cookies, has 1/2 pack of nabs for her daily snack -Current exercise: No structured plan but is active all day  -Educated on A1c and blood sugar goals; Exercise goal of 150 minutes per week; Prevention and management of hypoglycemic episodes; Taking humalog with meals. -Counseled to check feet daily and get yearly eye exams -Counseled on diet and exercise extensively Recommended she discontinue Januvia now that she is on Trulicity. Will discuss with PCP.  -Educated patient on checking BG 2-3 hours after a meal.    Patient Goals/Self-Care Activities . Patient  will:  - take medications as prescribed focus on medication adherence by using pill box check glucose twice daily, document, and provide at future appointments check blood pressure 3 times weekly, document, and provide at future appointments collaborate with provider on medication access solutions engage in dietary modifications by continued reduction in  sugary food  Follow Up Plan: Telephone follow up appointment with care management team member scheduled for: CPA 1 month           Medication Assistance: None required.  Patient affirms current coverage meets needs.  Patient's preferred pharmacy is:  [facility] [address], [place] - [place] [address] Phone: [phone] Fax: [phone]  Uses pill box? NO. She has one but is not using. Pt endorses 70% compliance  We discussed: Benefits of medication synchronization, packaging and delivery as well as enhanced pharmacist oversight with [employer]. Patient decided to: Continue current medication management strategy  Care Plan and Follow Up Patient Decision:  Patient agrees to Care Plan and Follow-up.  Plan: Telephone follow up appointment with care management team member scheduled for:  1 month CPA,2  months PharmD  Junita Push. Kenton Kingfisher PharmD, [facility] [phone]

## 2021-04-26 LAB — HM DIABETES EYE EXAM

## 2021-05-09 ENCOUNTER — Other Ambulatory Visit: Payer: Self-pay | Admitting: Nurse Practitioner

## 2021-05-09 DIAGNOSIS — I152 Hypertension secondary to endocrine disorders: Secondary | ICD-10-CM

## 2021-05-09 DIAGNOSIS — E1159 Type 2 diabetes mellitus with other circulatory complications: Secondary | ICD-10-CM

## 2021-05-12 NOTE — Patient Instructions (Addendum)
Visit Information  It was a pleasure speaking with you today. Thank you for letting me be part of your clinical team. Please call with any questions or concerns.   Goals Addressed            This Visit's Progress   . Pharm D- Et m y taget A1c       Timeframe:  Short-Term Goal Priority:  Medium Start Date:                             Expected End Date:                       Follow Up Date 2 month follow up visit    - set target A1C    Why is this important?    Your target A1C is decided together by you and your doctor.   It is based on several things like your age and other health issues.    Notes:     . Track and Manage My Blood Pressure-Hypertension       Timeframe:  Long-Range Goal Priority:  Medium Start Date:                             Expected End Date:                       Follow Up Date 2 month follow up    - check blood pressure 3 times per week - write blood pressure results in a log or diary    Why is this important?    You won't feel high blood pressure, but it can still hurt your blood vessels.   High blood pressure can cause heart or kidney problems. It can also cause a stroke.   Making lifestyle changes like losing a little weight or eating less salt will help.   Checking your blood pressure at home and at different times of the day can help to control blood pressure.   If the doctor prescribes medicine remember to take it the way the doctor ordered.   Call the office if you cannot afford the medicine or if there are questions about it.     Notes:        The patient verbalized understanding of instructions, educational materials, and care plan provided today and agreed to receive a mailed copy of patient instructions, educational materials, and care plan.   Telephone follow up appointment with pharmacy team member scheduled for: 1 month CPA  Junita Push. Bolden Hagerman PharmD, BCPS Clinical Pharmacist [phone]  Preventing High  Cholesterol Cholesterol is a white, waxy substance similar to fat that the human body needs to help build cells. The liver makes all the cholesterol that a person's body needs. Having high cholesterol (hypercholesterolemia) increases your risk for heart disease and stroke. Extra or excess cholesterol comes from the food that you eat. High cholesterol can often be prevented with diet and lifestyle changes. If you already have high cholesterol, you can control it with diet, lifestyle changes, and medicines. How can high cholesterol affect me? If you have high cholesterol, fatty deposits (plaques) may build up on the walls of your blood vessels. The blood vessels that carry blood away from your heart are called arteries. Plaques make the arteries narrower and stiffer. This in turn can:  Restrict or block blood  flow and cause blood clots to form.  Increase your risk for heart attack and stroke. What can increase my risk for high cholesterol? This condition is more likely to develop in people who:  Eat foods that are high in saturated fat or cholesterol. Saturated fat is mostly found in foods that come from animal sources.  Are overweight.  Are not getting enough exercise.  Have a family history of high cholesterol (familial hypercholesterolemia). What actions can I take to prevent this? Nutrition  Eat less saturated fat.  Avoid trans fats (partially hydrogenated oils). These are often found in margarine and in some baked goods, fried foods, and snacks bought in packages.  Avoid precooked or cured meat, such as bacon, sausages, or meat loaves.  Avoid foods and drinks that have added sugars.  Eat more fruits, vegetables, and whole grains.  Choose healthy sources of protein, such as fish, poultry, lean cuts of red meat, beans, peas, lentils, and nuts.  Choose healthy sources of fat, such as: ? Nuts. ? Vegetable oils, especially olive oil. ? Fish that have healthy fats, such as omega-3  fatty acids. These fish include mackerel or salmon.   Lifestyle  Lose weight if you are overweight. Maintaining a healthy body mass index (BMI) can help prevent or control high cholesterol. It can also lower your risk for diabetes and high blood pressure. Ask your health care provider to help you with a diet and exercise plan to lose weight safely.  Do not use any products that contain nicotine or tobacco, such as cigarettes, e-cigarettes, and chewing tobacco. If you need help quitting, ask your health care provider. Alcohol use  Do not drink alcohol if: ? Your health care provider tells you not to drink. ? You are pregnant, may be pregnant, or are planning to become pregnant.  If you drink alcohol: ? Limit how much you use to:  0-1 drink a day for women.  0-2 drinks a day for men. ? Be aware of how much alcohol is in your drink. In the [place] one drink equals one 12 oz bottle of beer (355 mL), one 5 oz glass of wine (148 mL), or one 1 oz glass of hard liquor (44 mL). Activity  Get enough exercise. Do exercises as told by your health care provider.  Each week, do at least 150 minutes of exercise that takes a medium level of effort (moderate-intensity exercise). This kind of exercise: ? Makes your heart beat faster while allowing you to still be able to talk. ? Can be done in short sessions several times a day or longer sessions a few times a week. For example, on 5 days each week, you could walk fast or ride your bike 3 times a day for 10 minutes each time.   Medicines  Your health care provider may recommend medicines to help lower cholesterol. This may be a medicine to lower the amount of cholesterol that your liver makes. You may need medicine if: ? Diet and lifestyle changes have not lowered your cholesterol enough. ? You have high cholesterol and other risk factors for heart disease or stroke.  Take over-the-counter and prescription medicines only as told by your health care  provider. General information  Manage your risk factors for high cholesterol. Talk with your health care provider about all your risk factors and how to lower your risk.  Manage other conditions that you have, such as diabetes or high blood pressure (hypertension).  Have blood tests to check your  cholesterol levels at regular points in time as told by your health care provider.  Keep all follow-up visits as told by your health care provider. This is important. Where to find more information  American Heart Association: www.heart.org  National Heart, Lung, and Blood Institute: https://wilson-eaton.com/ Summary  High cholesterol increases your risk for heart disease and stroke. By keeping your cholesterol level low, you can reduce your risk for these conditions.  High cholesterol can often be prevented with diet and lifestyle changes.  Work with your health care provider to manage your risk factors, and have your blood tested regularly. This information is not intended to replace advice given to you by your health care provider. Make sure you discuss any questions you have with your health care provider. Document Revised: 09/16/2019 Document Reviewed: 09/16/2019 Elsevier Patient Education  [place].

## 2021-05-13 ENCOUNTER — Ambulatory Visit: Payer: Self-pay | Admitting: General Practice

## 2021-05-13 ENCOUNTER — Telehealth: Payer: Self-pay | Admitting: General Practice

## 2021-05-13 DIAGNOSIS — E1159 Type 2 diabetes mellitus with other circulatory complications: Secondary | ICD-10-CM

## 2021-05-13 DIAGNOSIS — E1142 Type 2 diabetes mellitus with diabetic polyneuropathy: Secondary | ICD-10-CM | POA: Diagnosis not present

## 2021-05-13 DIAGNOSIS — E785 Hyperlipidemia, unspecified: Secondary | ICD-10-CM

## 2021-05-13 DIAGNOSIS — I152 Hypertension secondary to endocrine disorders: Secondary | ICD-10-CM

## 2021-05-13 DIAGNOSIS — E1169 Type 2 diabetes mellitus with other specified complication: Secondary | ICD-10-CM

## 2021-05-13 NOTE — Patient Instructions (Signed)
Visit Information  PATIENT GOALS: Goals Addressed            This Visit's Progress   . RNCM: Monitor and Manage My Blood Sugar-Diabetes Type 2       Timeframe:  Long-Range Goal Priority:  High Start Date: 02-09-2021                             Expected End Date:    06-16-2022                 Follow Up Date 07-08-2021   - check blood sugar at prescribed times - check blood sugar before and after exercise - check blood sugar if I feel it is too high or too low - enter blood sugar readings and medication or insulin into daily log - take the blood sugar log to all doctor visits   -take meter to the pharmacy and have pharmacy show how to use. The patient is having a hard time with the glucose meter and using. Education and support given.   Why is this important?    Checking your blood sugar at home helps to keep it from getting very high or very low.   Writing the results in a diary or log helps the doctor know how to care for you.   Your blood sugar log should have the time, date and the results.   Also, write down the amount of insulin or other medicine that you take.   Other information, like what you ate, exercise done and how you were feeling, will also be helpful.     Notes: Extensive education today on making sure she is checking her blood sugars at home. Has not been doing this. Is not familiar with the meter and how to use. Encouraged her to take to the pharmacy today or try to get her daughter to help  her with using the meter. 05-13-2021: The patient states that she is using her glucose meter but had not taken her blood sugars today. She was concerned about the storms. She states it was 140 something yesterday. She did not have a log book available. Extensive education and reenforcement of the need to consistently check blood sugars and record. Also review of Hemoglobin A1C and the goal being 7.0 or less. The patients last A1C on 04-04-2021 was 9.6, up from 9.1.       Patient  Care Plan: RNCM: Diabetes Type 2 (Adult)    Problem Identified: RNCM: Glycemic Management (Diabetes, Type 2)   Priority: High    Long-Range Goal: RNCM: Glycemic Management Optimized   Priority: High  Note:   Objective:  . Lab Results .  Component . Value . Date .   Marland Kitchen HGBA1C . 9.6 (H) . 04/04/2021 .    Marland Kitchen Lab Results .  Component . Value . Date .   Marland Kitchen CREATININE . 0.96 . 04/04/2021 .    Marland Kitchen No results found for: EGFR Current Barriers:  Marland Kitchen Knowledge Deficits related to basic Diabetes pathophysiology and self care/management . Knowledge Deficits related to medications used for management of diabetes . Literacy barriers . Does not use cbg meter  . Limited Social Support . Non-compliance  . Unable to self administer medications as prescribed . Does not attend all scheduled provider appointments . Does not adhere to prescribed medication regimen . Lacks social connections . Does not maintain contact with provider office . Does not contact provider office  for questions/concerns Case Manager Clinical Goal(s):  . patient will demonstrate improved adherence to prescribed treatment plan for diabetes self care/management as evidenced by: daily monitoring and recording of CBG  adherence to ADA/ carb modified diet exercise 3/4 days/week adherence to prescribed medication regimen contacting provider for new or worsened symptoms or questions Interventions:  . Collaboration with Jon Billings, NP regarding development and update of comprehensive plan of care as evidenced by provider attestation and co-signature . Inter-disciplinary care team collaboration (see longitudinal plan of care) . Provided education to patient about basic DM disease process. 05-13-2021: Extensive review and reeducation on DM and the effects of uncontrolled DM on body systems. The patient states she is compliant but her hemoglobin A1C is elevated to 9.6. Education on goal of hemoglobin A1C of <7.0. . Reviewed medications  with patient and discussed importance of medication adherence. 05-13-2021: The patient states compliance with the current medications regimen. The patient is currently working with the pharmacist also. The patient was able to tell the RNCM her current medications regimen.  . Discussed plans with patient for ongoing care management follow up and provided patient with direct contact information for care management team . Provided patient with written educational materials related to hypo and hyperglycemia and importance of correct treatment. Reviewed with the patient sx/sx to look for with hypo/hyperglycemia. The patient denies any issues and says she feels "great"  05-13-2021: Denies any lows at this time. . Reviewed scheduled/upcoming provider appointments including: 07-05-2021 . Advised patient, providing education and rationale, to check cbg BID and record, calling pcp for findings outside established parameters.  The patient has not checked her blood sugars because she does not know how to work the meter. Trouble shooting attempted. The patient is going to see if her daughter can help her figure it out, or she will take it back to the pharmacy and get them to help her review how to use. Will touch base with pham D also to see if any recommendations. Expressed the importance of blood sugar checks especially since there has been a change in her medications. The patient verbalized understanding. Goal for blood sugars: fasting <130 and post prandial <180. 05-13-2021: The patient is still unable to provide a list of readings for the RNCM. She states that she has not taken her blood sugar this am as she is concerned about storms in her area. She states yesterday it was 140 something. Education on the rational for checking blood sugars and recording. Reeducation on fasting blood sugar <130 and post prandial <180. The patient needs frequent reminders to check blood sugars. Reviewed the goal of hemoglobin A1C of 7.0 or  less. Discussed how her A1C has went in the higher range and the importance of medication compliance, exercise and dietary restrictions.  . Review of patient status, including review of consultants reports, relevant laboratory and other test results, and medications completed. Patient Goals: - barriers to adherence to treatment plan identified - blood glucose monitoring encouraged - blood glucose readings reviewed - individualized medical nutrition therapy provided - mutual A1C goal set or reviewed - resources required to improve adherence to care identified - self-awareness of signs/symptoms of hypo or hyperglycemia encouraged - use of blood glucose monitoring log promoted Self-Care Activities - UNABLE to independently manage DM Attends all scheduled provider appointments Checks blood sugars as prescribed and utilize hyper and hypoglycemia protocol as needed Adheres to prescribed ADA/carb modified Follow Up Plan: Telephone follow up appointment with care management team member scheduled for:  07-08-2021 at 0945 am  Timeframe:  Long-Range Goal Priority:  High Start Date: 02-09-2021                             Expected End Date:    06-16-2022                 Follow Up Date 07-08-2021   - check blood sugar at prescribed times - check blood sugar before and after exercise - check blood sugar if I feel it is too high or too low - enter blood sugar readings and medication or insulin into daily log - take the blood sugar log to all doctor visits   -take meter to the pharmacy and have pharmacy show how to use. The patient is having a hard time with the glucose meter and using. Education and support given.   Why is this important?    Checking your blood sugar at home helps to keep it from getting very high or very low.   Writing the results in a diary or log helps the doctor know how to care for you.   Your blood sugar log should have the time, date and the results.   Also, write down the  amount of insulin or other medicine that you take.   Other information, like what you ate, exercise done and how you were feeling, will also be helpful.     Notes: Extensive education today on making sure she is checking her blood sugars at home. Has not been doing this. Is not familiar with the meter and how to use. Encouraged her to take to the pharmacy today or try to get her daughter to help  her with using the meter. 05-13-2021: The patient states that she is using her glucose meter but had not taken her blood sugars today. She was concerned about the storms. She states it was 140 something yesterday. She did not have a log book available. Extensive education and reenforcement of the need to consistently check blood sugars and record. Also review of Hemoglobin A1C and the goal being 7.0 or less. The patients last A1C on 04-04-2021 was 9.6, up from 9.1.    Task: RNCM: Alleviate Barriers to Glycemic Management   Note:   Care Management Activities:    - barriers to adherence to treatment plan identified - blood glucose monitoring encouraged - blood glucose readings reviewed - individualized medical nutrition therapy provided - mutual A1C goal set or reviewed - resources required to improve adherence to care identified - self-awareness of signs/symptoms of hypo or hyperglycemia encouraged - use of blood glucose monitoring log promoted       Patient Care Plan: RNCM: Hypertension (Adult)    Problem Identified: RNCM: Hypertension (Hypertension)   Priority: Medium    Long-Range Goal: RNCM: Hypertension Monitored   Priority: Medium  Note:   Objective:  . Last practice recorded BP readings:  . BP Readings from Last 3 Encounters: .  04/04/21 . 136/74 .  02/04/21 . 129/66 .  01/05/21 . 126/68 .    Marland Kitchen Most recent eGFR/CrCl: No results found for: EGFR  No components found for: CRCL Current Barriers:  Marland Kitchen Knowledge Deficits related to basic understanding of hypertension pathophysiology and  self care management . Knowledge Deficits related to understanding of medications prescribed for management of hypertension . Literacy barriers . Limited Social Support . Unable to independently manage HTN . Unable to self administer medications as  prescribed . Does not adhere to prescribed medication regimen . Lacks social connections . Does not maintain contact with provider office . Does not contact provider office for questions/concerns Case Manager Clinical Goal(s):  Marland Kitchen Over the next 120 days, patient will verbalize understanding of plan for hypertension management . Over the next 120 days, patient will attend all scheduled medical appointments: 07-05-2021 . Over the next 120 days, patient will demonstrate improved adherence to prescribed treatment plan for hypertension as evidenced by taking all medications as prescribed, monitoring and recording blood pressure as directed, adhering to low sodium/DASH diet . Over the next 120 days, patient will demonstrate improved health management independence as evidenced by checking blood pressure as directed and notifying PCP if SBP>160 or DBP > 90, taking all medications as prescribe, and adhering to a low sodium diet as discussed. . Over the next 120 days, patient will verbalize basic understanding of hypertension disease process and self health management plan as evidenced by medications compliance, dietary compliance, and working with the CCM team to optimize health and well being Interventions:  . Collaboration with Jon Billings, NP regarding development and update of comprehensive plan of care as evidenced by provider attestation and co-signature . Inter-disciplinary care team collaboration (see longitudinal plan of care) . Evaluation of current treatment plan related to hypertension self management and patient's adherence to plan as established by provider. 05-13-2021: The patient states she is checking her blood pressures and recording at  home. Yesterday her blood pressure was 130/71. The patient denies any issues with elevated blood pressures. States compliance with heart healthy/ADA diet. Will continue to monitor.  . Provided education to patient re: stroke prevention, s/s of heart attack and stroke, DASH diet, complications of uncontrolled blood pressure . Reviewed medications with patient and discussed importance of compliance. 05-13-2021: Review of medications and the patient states compliance. States that she also talked to the pharmacist recently.  . Discussed plans with patient for ongoing care management follow up and provided patient with direct contact information for care management team . Advised patient, providing education and rationale, to monitor blood pressure daily and record, calling PCP for findings outside established parameters.  . Reviewed scheduled/upcoming provider appointments including: 07-05-2021 Patient Goals:  blood pressure equipment and technique reviewed - blood pressure trends reviewed - depression screen reviewed - home or ambulatory blood pressure monitoring encouraged    Notes: The patient states she can not find the cuff end to her blood pressure cuff. The patient advised to use OTC benefit through [facility] to get new cuff. The patient will check into this. 05-13-2021: The patient confirms she has a working blood pressure cuff at this time.  Self-Care Activities: - Self administers medications as prescribed Attends all scheduled provider appointments Calls provider office for new concerns, questions, or BP outside discussed parameters Checks BP and records as discussed Follows a low sodium diet/DASH diet Follow Up Plan: Telephone follow up appointment with care management team member scheduled for: 07-08-2021 at 0945 am   Task: RNCM: Identify and Monitor Blood Pressure Elevation   Note:   Care Management Activities:    - blood pressure equipment and technique reviewed - blood pressure trends  reviewed - depression screen reviewed - home or ambulatory blood pressure monitoring encouraged    Notes: The patient states she can not find the cuff end to her blood pressure cuff. The patient advised to use OTC benefit through [facility] to get new cuff. The patient will check into this  Patient Care Plan: RNCM: HLD Management    Problem Identified: RNCM: Management of HLD   Priority: Medium    Long-Range Goal: RNCM: HLD Management   Priority: Medium  Note:   Current Barriers:  . Poorly controlled hyperlipidemia, complicated by uncontrolled DM . Current antihyperlipidemic regimen: Lovastatin 40 mg QD . Most recent lipid panel:     Component Value Date/Time   CHOL 127 01/05/2021 1114   CHOL 137 12/04/2018 1106   TRIG 112 01/05/2021 1114   TRIG 262 (H) 12/04/2018 1106   HDL 37 (L) 01/05/2021 1114   CHOLHDL 4.0 04/10/2017 1347   VLDL 52 (H) 12/04/2018 1106   LDLCALC 69 01/05/2021 1114 .   Marland Kitchen ASCVD risk enhancing conditions: age >76, DM, HTN, CKD, former smoker . Unable to self administer medications as prescribed . Does not attend all scheduled provider appointments . Does not adhere to prescribed medication regimen . Lacks social connections . Does not contact provider office for questions/concerns  RN Care Manager Clinical Goal(s):  Marland Kitchen Over the next 120 days, patient will work with Consulting civil engineer, providers, and care team towards execution of optimized self-health management plan . patient will verbalize understanding of plan for effective management of HLD  . patient will work with [facility] and pcp  to address needs related to HLD  . patient will attend all scheduled medical appointments: 07-05-2021  Interventions: . Collaboration with Jon Billings, NP regarding development and update of comprehensive plan of care as evidenced by provider attestation and co-signature . Inter-disciplinary care team collaboration (see longitudinal plan of care) . Medication review performed;  medication list updated in electronic medical record.  Bertram Savin care team collaboration (see longitudinal plan of care) . Referred to pharmacy team for assistance with HLD medication management . Evaluation of current treatment plan related to HLD  and patient's adherence to plan as established by provider. . Advised patient to call the office for changes or questions. 05-13-2021: The patient states that she is doing well and denies any new concerns with management of her HLD. States she is staying active and following her medications regimen.  . Provided education to patient re: heart healthy diet, medications compliance, working with the CCM team  . Reviewed medications with patient and discussed compliance, the patient endorses taking medications as directed . Reviewed scheduled/upcoming provider appointments including: 07-05-2021 . Discussed plans with patient for ongoing care management follow up and provided patient with direct contact information for care management team   Patient Goals/Self-Care Activities: . Over the next 120 days, patient will:   - call for medicine refill 2 or 3 days before it runs out - call if I am sick and can't take my medicine - keep a list of all the medicines I take; vitamins and herbals too - learn to read medicine labels - use a pillbox to sort medicine - use an alarm clock or phone to remind me to take my medicine - change to whole grain breads, cereal, pasta - drink 6 to 8 glasses of water each day - eat 3 to 5 servings of fruits and vegetables each day - eat 5 or 6 small meals each day - fill half the plate with nonstarchy vegetables - limit fast food meals to no more than 1 per week - manage portion size - prepare main meal at home 3 to 5 days each week - read food labels for fat, fiber, carbohydrates and portion size - be open to making changes - I  can manage, know and watch for signs of a heart attack - if I have chest pain, call for  help - learn about small changes that will make a big difference - learn my personal risk factors   Follow Up Plan: Telephone follow up appointment with care management team member scheduled for: 07-08-2021 at 0945 am     Patient Care Plan: CCM Pharmacy Care Plan    Problem Identified: DM2, HTN, HLD, Osteopenia, CKD,   Priority: High    Long-Range Goal: Disease Management   Start Date: 04/25/2021  This Visit's Progress: On track  Priority: High  Note:   Current Barriers:  . Unable to independently monitor therapeutic efficacy . Unable to achieve control of diabetes  . Unable to maintain control of hypertension . Does not adhere to prescribed medication regimen . Does not contact provider office for questions/concerns  Pharmacist Clinical Goal(s):  Marland Kitchen Patient will achieve adherence to monitoring guidelines and medication adherence to achieve therapeutic efficacy . achieve control of diabetes as evidenced by A1c and BG readings . maintain control of blood pressure as evidenced by readings  . adhere to prescribed medication regimen as evidenced by fill dates, patient report . contact provider office for questions/concerns as evidenced notation of same in electronic health record through collaboration with PharmD and provider.   Interventions: . 1:1 collaboration with Jon Billings, NP regarding development and update of comprehensive plan of care as evidenced by provider attestation and co-signature . Inter-disciplinary care team collaboration (see longitudinal plan of care) . Comprehensive medication review performed; medication list updated in electronic medical record .  BP Readings from Last 3 Encounters:  04/04/21 136/74  02/04/21 129/66  01/05/21 126/68   Hypertension (BP goal <130/80) -Controlled -Current treatment:  Amlodipine 10 mg qd  Atenolol 100 mg qd  Spironolactone 25 mg qd  -Medications previously tried: NA -Current home readings: Has been checking  regularly,  Only has one reading of 144/79 --Denies hypotensive/hypertensive symptoms -Educated on BP goals and benefits of medications for prevention of heart attack, stroke and kidney damage; Daily salt intake goal < 2300 mg; Exercise goal of 150 minutes per week; Importance of home blood pressure monitoring; -Counseled to monitor BP at home 2-3 times weekly, document, and provide log at future appointments -Recommended to continue current medication Recommended patient begin checking BP regularly Educated on proper technique for BP monitoring.  The ASCVD Risk score Mikey Bussing DC Jr., et al., 2013) failed to calculate for the following reasons:   The valid total cholesterol range is 130 to 320 mg/dL Lab Results  Component Value Date   LDLCALC 69 01/05/2021   Hyperlipidemia: (LDL goal < 70) -Controlled -Current treatment: . Lovastatin 40 mg qd -Medications previously tried: na  -Educated on Cholesterol goals;  Benefits of statin for ASCVD risk reduction; Importance of limiting foods high in cholesterol; -Recommended to continue current medication  Lab Results  Component Value Date   HGBA1C 9.6 (H) 04/04/2021    Diabetes (A1c goal <7%) -Uncontrolled -Current medications: . Trulicity 1.5 mg q Monday . Tresiba 40 units qhs . Humalog 25 u bid  . Jardiance 25 mg qd . Januvia 50 mg qd -Medications previously tried: [place] -Current home glucose readings . fasting glucose: 124 . post prandial glucose: 150 (30 -60 min after dinner) -Denies hypoglycemic/hyperglycemic symptoms -Current meal patterns: has stopped eating cookies, has 1/2 pack of nabs for her daily snack -Current exercise: No structured plan but is active all day  -Educated on A1c and  blood sugar goals; Exercise goal of 150 minutes per week; Prevention and management of hypoglycemic episodes; Taking humalog with meals. -Counseled to check feet daily and get yearly eye exams -Counseled on diet and exercise  extensively Recommended she discontinue Januvia now that she is on Trulicity. Will discuss with PCP.  -Educated patient on checking BG 2-3 hours after a meal.    Patient Goals/Self-Care Activities . Patient will:  - take medications as prescribed focus on medication adherence by using pill box check glucose twice daily, document, and provide at future appointments check blood pressure 3 times weekly, document, and provide at future appointments collaborate with provider on medication access solutions engage in dietary modifications by continued reduction in  sugary food  Follow Up Plan: Telephone follow up appointment with care management team member scheduled for: CPA 1 month          The patient verbalized understanding of instructions, educational materials, and care plan provided today and declined offer to receive copy of patient instructions, educational materials, and care plan.   Telephone follow up appointment with care management team member scheduled for: 07-08-2021 at [place] am  Noreene Larsson RN, MSN, [facility] Mobile: [phone]

## 2021-05-13 NOTE — Chronic Care Management (AMB) (Signed)
Chronic Care Management   CCM RN Visit Note  05/13/2021 Name: Tami Cummings MRN: [id number] DOB: 03/25/1946  Subjective: Tami Cummings is a 75 y.o. year old female who is a primary care patient of Jon Billings, NP. The care management team was consulted for assistance with disease management and care coordination needs.    Engaged with patient by telephone for follow up visit in response to provider referral for case management and/or care coordination services.   Consent to Services:  The patient was given information about Chronic Care Management services, agreed to services, and gave verbal consent prior to initiation of services.  Please see initial visit note for detailed documentation.   Patient agreed to services and verbal consent obtained.   Assessment: Review of patient past medical history, allergies, medications, health status, including review of consultants reports, laboratory and other test data, was performed as part of comprehensive evaluation and provision of chronic care management services.   SDOH (Social Determinants of Health) assessments and interventions performed:    CCM Care Plan  Allergies  Allergen Reactions  . Actos [Pioglitazone] Other (See Comments)    CHF  . Amlodipine Swelling    Leg swelling   . Benazepril Other (See Comments)    angioedema  . Hydrochlorothiazide Other (See Comments)    angioedema  . Metformin And Related Diarrhea    Outpatient Encounter Medications as of 05/13/2021  Medication Sig  . amLODipine (NORVASC) 10 MG tablet Take 1 tablet (10 mg total) by mouth daily.  Marland Kitchen atenolol (TENORMIN) 100 MG tablet Take 1 tablet (100 mg total) by mouth daily.  . BD PEN NEEDLE MICRO U/F 32G X 6 MM MISC USE 4 TO 5 TIMES DAILY WITH INSULIN USE  . Blood Glucose Monitoring Suppl (ONETOUCH VERIO) w/Device KIT Use to check blood sugar 2-3 times daily and document, bring results to provider visits.  . Dulaglutide (TRULICITY) 1.5  AX/6.5VV SOPN Inject 1.5 mg into the skin once a week.  Marland Kitchen glucose blood test strip Use to check blood sugar 2-3 times daily and document, bring results to provider visits.  . insulin degludec (TRESIBA FLEXTOUCH) 100 UNIT/ML FlexTouch Pen Inject 40 Units into the skin daily.  . insulin lispro (HUMALOG KWIKPEN) 100 UNIT/ML KwikPen Inject 0.25 mLs (25 Units total) into the skin 2 (two) times daily before a meal.  . JANUVIA 50 MG tablet Take 1 tablet by mouth once daily  . JARDIANCE 25 MG TABS tablet TAKE 1 TABLET BY MOUTH ONCE DAILY BEFORE BREAKFAST  . Lancets (ONETOUCH ULTRASOFT) lancets Use to check blood sugar 2-3 times daily and document, bring results to provider visits.  Marland Kitchen lovastatin (MEVACOR) 40 MG tablet Take 1 tablet (40 mg total) by mouth daily with breakfast.  . OneTouch Delica Lancets 74M MISC 1 applicator by Does not apply route in the morning and at bedtime.  Marland Kitchen spironolactone (ALDACTONE) 25 MG tablet Take 1 tablet by mouth once daily   No facility-administered encounter medications on file as of 05/13/2021.    Patient Active Problem List   Diagnosis Date Noted  . Osteopenia 01/01/2021  . Vitamin D deficiency 10/18/2020  . Vitamin B12 deficiency 10/18/2020  . CKD (chronic kidney disease) stage 3, GFR 30-59 ml/min (HCC) 10/08/2020  . Senile purpura ([place]) 04/10/2017  . Morbid obesity ([place]) 04/10/2017  . Advance care planning 04/10/2017  . Noncompliance 10/30/2016  . DM type 2 with diabetic peripheral neuropathy ([place])   . Hyperlipidemia associated with type 2 diabetes mellitus ([place])   .  Hypertension associated with diabetes ([place])     Conditions to be addressed/monitored:HTN, HLD and DMII  Care Plan : RNCM: Diabetes Type 2 (Adult)  Updates made by Vanita Ingles since 05/13/2021 12:00 AM    Problem: RNCM: Glycemic Management (Diabetes, Type 2)   Priority: High    Long-Range Goal: RNCM: Glycemic Management Optimized   Priority: High  Note:   Objective:  . Lab Results  .  Component . Value . Date .   Marland Kitchen HGBA1C . 9.6 (H) . 04/04/2021 .    Marland Kitchen Lab Results .  Component . Value . Date .   Marland Kitchen CREATININE . 0.96 . 04/04/2021 .    Marland Kitchen No results found for: EGFR Current Barriers:  Marland Kitchen Knowledge Deficits related to basic Diabetes pathophysiology and self care/management . Knowledge Deficits related to medications used for management of diabetes . Literacy barriers . Does not use cbg meter  . Limited Social Support . Non-compliance  . Unable to self administer medications as prescribed . Does not attend all scheduled provider appointments . Does not adhere to prescribed medication regimen . Lacks social connections . Does not maintain contact with provider office . Does not contact provider office for questions/concerns Case Manager Clinical Goal(s):  . patient will demonstrate improved adherence to prescribed treatment plan for diabetes self care/management as evidenced by: daily monitoring and recording of CBG  adherence to ADA/ carb modified diet exercise 3/4 days/week adherence to prescribed medication regimen contacting provider for new or worsened symptoms or questions Interventions:  . Collaboration with Jon Billings, NP regarding development and update of comprehensive plan of care as evidenced by provider attestation and co-signature . Inter-disciplinary care team collaboration (see longitudinal plan of care) . Provided education to patient about basic DM disease process. 05-13-2021: Extensive review and reeducation on DM and the effects of uncontrolled DM on body systems. The patient states she is compliant but her hemoglobin A1C is elevated to 9.6. Education on goal of hemoglobin A1C of <7.0. . Reviewed medications with patient and discussed importance of medication adherence. 05-13-2021: The patient states compliance with the current medications regimen. The patient is currently working with the pharmacist also. The patient was able to tell the RNCM her  current medications regimen.  . Discussed plans with patient for ongoing care management follow up and provided patient with direct contact information for care management team . Provided patient with written educational materials related to hypo and hyperglycemia and importance of correct treatment. Reviewed with the patient sx/sx to look for with hypo/hyperglycemia. The patient denies any issues and says she feels "great"  05-13-2021: Denies any lows at this time. . Reviewed scheduled/upcoming provider appointments including: 07-05-2021 . Advised patient, providing education and rationale, to check cbg BID and record, calling pcp for findings outside established parameters.  The patient has not checked her blood sugars because she does not know how to work the meter. Trouble shooting attempted. The patient is going to see if her daughter can help her figure it out, or she will take it back to the pharmacy and get them to help her review how to use. Will touch base with pham D also to see if any recommendations. Expressed the importance of blood sugar checks especially since there has been a change in her medications. The patient verbalized understanding. Goal for blood sugars: fasting <130 and post prandial <180. 05-13-2021: The patient is still unable to provide a list of readings for the RNCM. She states that she has  not taken her blood sugar this am as she is concerned about storms in her area. She states yesterday it was 140 something. Education on the rational for checking blood sugars and recording. Reeducation on fasting blood sugar <130 and post prandial <180. The patient needs frequent reminders to check blood sugars. Reviewed the goal of hemoglobin A1C of 7.0 or less. Discussed how her A1C has went in the higher range and the importance of medication compliance, exercise and dietary restrictions.  . Review of patient status, including review of consultants reports, relevant laboratory and other test  results, and medications completed. Patient Goals: - barriers to adherence to treatment plan identified - blood glucose monitoring encouraged - blood glucose readings reviewed - individualized medical nutrition therapy provided - mutual A1C goal set or reviewed - resources required to improve adherence to care identified - self-awareness of signs/symptoms of hypo or hyperglycemia encouraged - use of blood glucose monitoring log promoted Self-Care Activities - UNABLE to independently manage DM Attends all scheduled provider appointments Checks blood sugars as prescribed and utilize hyper and hypoglycemia protocol as needed Adheres to prescribed ADA/carb modified Follow Up Plan: Telephone follow up appointment with care management team member scheduled for: 07-08-2021 at 0945 am  Timeframe:  Long-Range Goal Priority:  High Start Date: 02-09-2021                             Expected End Date:    06-16-2022                 Follow Up Date 07-08-2021   - check blood sugar at prescribed times - check blood sugar before and after exercise - check blood sugar if I feel it is too high or too low - enter blood sugar readings and medication or insulin into daily log - take the blood sugar log to all doctor visits   -take meter to the pharmacy and have pharmacy show how to use. The patient is having a hard time with the glucose meter and using. Education and support given.   Why is this important?    Checking your blood sugar at home helps to keep it from getting very high or very low.   Writing the results in a diary or log helps the doctor know how to care for you.   Your blood sugar log should have the time, date and the results.   Also, write down the amount of insulin or other medicine that you take.   Other information, like what you ate, exercise done and how you were feeling, will also be helpful.     Notes: Extensive education today on making sure she is checking her blood sugars  at home. Has not been doing this. Is not familiar with the meter and how to use. Encouraged her to take to the pharmacy today or try to get her daughter to help  her with using the meter. 05-13-2021: The patient states that she is using her glucose meter but had not taken her blood sugars today. She was concerned about the storms. She states it was 140 something yesterday. She did not have a log book available. Extensive education and reenforcement of the need to consistently check blood sugars and record. Also review of Hemoglobin A1C and the goal being 7.0 or less. The patients last A1C on 04-04-2021 was 9.6, up from 9.1.    Care Plan : RNCM: Hypertension (Adult)  Updates made  by Vanita Ingles since 05/13/2021 12:00 AM    Problem: RNCM: Hypertension (Hypertension)   Priority: Medium    Long-Range Goal: RNCM: Hypertension Monitored   Priority: Medium  Note:   Objective:  . Last practice recorded BP readings:  . BP Readings from Last 3 Encounters: .  04/04/21 . 136/74 .  02/04/21 . 129/66 .  01/05/21 . 126/68 .    Marland Kitchen Most recent eGFR/CrCl: No results found for: EGFR  No components found for: CRCL Current Barriers:  Marland Kitchen Knowledge Deficits related to basic understanding of hypertension pathophysiology and self care management . Knowledge Deficits related to understanding of medications prescribed for management of hypertension . Literacy barriers . Limited Social Support . Unable to independently manage HTN . Unable to self administer medications as prescribed . Does not adhere to prescribed medication regimen . Lacks social connections . Does not maintain contact with provider office . Does not contact provider office for questions/concerns Case Manager Clinical Goal(s):  Marland Kitchen Over the next 120 days, patient will verbalize understanding of plan for hypertension management . Over the next 120 days, patient will attend all scheduled medical appointments: 07-05-2021 . Over the next 120 days,  patient will demonstrate improved adherence to prescribed treatment plan for hypertension as evidenced by taking all medications as prescribed, monitoring and recording blood pressure as directed, adhering to low sodium/DASH diet . Over the next 120 days, patient will demonstrate improved health management independence as evidenced by checking blood pressure as directed and notifying PCP if SBP>160 or DBP > 90, taking all medications as prescribe, and adhering to a low sodium diet as discussed. . Over the next 120 days, patient will verbalize basic understanding of hypertension disease process and self health management plan as evidenced by medications compliance, dietary compliance, and working with the CCM team to optimize health and well being Interventions:  . Collaboration with Jon Billings, NP regarding development and update of comprehensive plan of care as evidenced by provider attestation and co-signature . Inter-disciplinary care team collaboration (see longitudinal plan of care) . Evaluation of current treatment plan related to hypertension self management and patient's adherence to plan as established by provider. 05-13-2021: The patient states she is checking her blood pressures and recording at home. Yesterday her blood pressure was 130/71. The patient denies any issues with elevated blood pressures. States compliance with heart healthy/ADA diet. Will continue to monitor.  . Provided education to patient re: stroke prevention, s/s of heart attack and stroke, DASH diet, complications of uncontrolled blood pressure . Reviewed medications with patient and discussed importance of compliance. 05-13-2021: Review of medications and the patient states compliance. States that she also talked to the pharmacist recently.  . Discussed plans with patient for ongoing care management follow up and provided patient with direct contact information for care management team . Advised patient, providing  education and rationale, to monitor blood pressure daily and record, calling PCP for findings outside established parameters.  . Reviewed scheduled/upcoming provider appointments including: 07-05-2021 Patient Goals:  blood pressure equipment and technique reviewed - blood pressure trends reviewed - depression screen reviewed - home or ambulatory blood pressure monitoring encouraged    Notes: The patient states she can not find the cuff end to her blood pressure cuff. The patient advised to use OTC benefit through [facility] to get new cuff. The patient will check into this. 05-13-2021: The patient confirms she has a working blood pressure cuff at this time.  Self-Care Activities: - Self administers medications  as prescribed Attends all scheduled provider appointments Calls provider office for new concerns, questions, or BP outside discussed parameters Checks BP and records as discussed Follows a low sodium diet/DASH diet Follow Up Plan: Telephone follow up appointment with care management team member scheduled for: 07-08-2021 at 0945 am   Care Plan : RNCM: HLD Management  Updates made by Vanita Ingles since 05/13/2021 12:00 AM    Problem: RNCM: Management of HLD   Priority: Medium    Long-Range Goal: RNCM: HLD Management   Priority: Medium  Note:   Current Barriers:  . Poorly controlled hyperlipidemia, complicated by uncontrolled DM . Current antihyperlipidemic regimen: Lovastatin 40 mg QD . Most recent lipid panel:     Component Value Date/Time   CHOL 127 01/05/2021 1114   CHOL 137 12/04/2018 1106   TRIG 112 01/05/2021 1114   TRIG 262 (H) 12/04/2018 1106   HDL 37 (L) 01/05/2021 1114   CHOLHDL 4.0 04/10/2017 1347   VLDL 52 (H) 12/04/2018 1106   LDLCALC 69 01/05/2021 1114 .   Marland Kitchen ASCVD risk enhancing conditions: age >12, DM, HTN, CKD, former smoker . Unable to self administer medications as prescribed . Does not attend all scheduled provider appointments . Does not adhere to prescribed  medication regimen . Lacks social connections . Does not contact provider office for questions/concerns  RN Care Manager Clinical Goal(s):  Marland Kitchen Over the next 120 days, patient will work with Consulting civil engineer, providers, and care team towards execution of optimized self-health management plan . patient will verbalize understanding of plan for effective management of HLD  . patient will work with [facility] and pcp  to address needs related to HLD  . patient will attend all scheduled medical appointments: 07-05-2021  Interventions: . Collaboration with Jon Billings, NP regarding development and update of comprehensive plan of care as evidenced by provider attestation and co-signature . Inter-disciplinary care team collaboration (see longitudinal plan of care) . Medication review performed; medication list updated in electronic medical record.  Bertram Savin care team collaboration (see longitudinal plan of care) . Referred to pharmacy team for assistance with HLD medication management . Evaluation of current treatment plan related to HLD  and patient's adherence to plan as established by provider. . Advised patient to call the office for changes or questions. 05-13-2021: The patient states that she is doing well and denies any new concerns with management of her HLD. States she is staying active and following her medications regimen.  . Provided education to patient re: heart healthy diet, medications compliance, working with the CCM team  . Reviewed medications with patient and discussed compliance, the patient endorses taking medications as directed . Reviewed scheduled/upcoming provider appointments including: 07-05-2021 . Discussed plans with patient for ongoing care management follow up and provided patient with direct contact information for care management team   Patient Goals/Self-Care Activities: . Over the next 120 days, patient will:   - call for medicine refill 2 or 3 days before  it runs out - call if I am sick and can't take my medicine - keep a list of all the medicines I take; vitamins and herbals too - learn to read medicine labels - use a pillbox to sort medicine - use an alarm clock or phone to remind me to take my medicine - change to whole grain breads, cereal, pasta - drink 6 to 8 glasses of water each day - eat 3 to 5 servings of fruits and vegetables each day - eat  5 or 6 small meals each day - fill half the plate with nonstarchy vegetables - limit fast food meals to no more than 1 per week - manage portion size - prepare main meal at home 3 to 5 days each week - read food labels for fat, fiber, carbohydrates and portion size - be open to making changes - I can manage, know and watch for signs of a heart attack - if I have chest pain, call for help - learn about small changes that will make a big difference - learn my personal risk factors   Follow Up Plan: Telephone follow up appointment with care management team member scheduled for: 07-08-2021 at 0945 am       Plan:Telephone follow up appointment with care management team member scheduled for:  07-08-2021 at [place] am  Noreene Larsson RN, MSN, [facility] Mobile: [phone]

## 2021-05-19 ENCOUNTER — Ambulatory Visit
Admission: EM | Admit: 2021-05-19 | Discharge: 2021-05-19 | Disposition: A | Discharge status: Home / Self Care | Payer: Medicare Other | Attending: Emergency Medicine | Admitting: Emergency Medicine

## 2021-05-19 ENCOUNTER — Telehealth: Payer: Self-pay | Admitting: Pharmacist

## 2021-05-19 ENCOUNTER — Encounter: Payer: Self-pay | Admitting: Emergency Medicine

## 2021-05-19 ENCOUNTER — Other Ambulatory Visit: Payer: Self-pay

## 2021-05-19 ENCOUNTER — Ambulatory Visit (INDEPENDENT_AMBULATORY_CARE_PROVIDER_SITE_OTHER): Payer: Medicare Other

## 2021-05-19 DIAGNOSIS — M79672 Pain in left foot: Secondary | ICD-10-CM

## 2021-05-19 DIAGNOSIS — L02612 Cutaneous abscess of left foot: Secondary | ICD-10-CM | POA: Insufficient documentation

## 2021-05-19 DIAGNOSIS — S90852A Superficial foreign body, left foot, initial encounter: Secondary | ICD-10-CM | POA: Diagnosis present

## 2021-05-19 MED ORDER — HIBICLENS 4 % EX LIQD: Freq: Every day | CUTANEOUS | 0 refills | Status: DC | PRN

## 2021-05-19 MED ORDER — AMOXICILLIN-POT CLAVULANATE 875-125 MG PO TABS: 1.0000 | ORAL_TABLET | Freq: Two times a day (BID) | ORAL | 0 refills | Status: DC

## 2021-05-19 NOTE — ED Triage Notes (Signed)
Patient c/o left foot pain and swelling that started 3 days ago.

## 2021-05-19 NOTE — ED Provider Notes (Signed)
HPI  SUBJECTIVE:  Tami Cummings is a 75 y.o. female who presents with pain and swelling lateral to the fifth metatarsal joints of her left foot starting 2 to 3 days ago.  States the swelling is getting larger in size.  She reports limitation of motion of her little toe.  Denies direct trauma, fevers, body aches, erythema streaking up her foot.  She has not tried anything for this.  No alleviating factors.  Symptoms are worse with palpation.  She is a diabetic with peripheral neuropathy, hypertension, chronic kidney disease stage III and osteopenia.  She is not on any anticoagulant antiplatelets.  [id number], Santiago Glad, NP   Past Medical History:  Diagnosis Date  . Arthritis    knee  . COPD (chronic obstructive pulmonary disease) ([place])   . Diabetes mellitus without complication ([place])    type 2  . Dyspnea   . Hyperlipidemia   . Hypertension    controlled  . Neuromuscular disorder ([facility])    numbness in hands and feet  . Shoulder pain, right   . Wears dentures    upper and lower    Past Surgical History:  Procedure Laterality Date  . ABDOMINAL HYSTERECTOMY    . BREAST BIOPSY Right 04/12/2016   stereo bx/clip- neg  . CARPAL TUNNEL RELEASE  06/17/00  . CATARACT EXTRACTION W/PHACO Left 09/20/2015   Procedure: CATARACT EXTRACTION PHACO AND INTRAOCULAR LENS PLACEMENT ([place]);  Surgeon: Ronnell Freshwater, MD;  Location: [facility];  Service: Ophthalmology;  Laterality: Left;  DIABETIC - insulin  . CATARACT EXTRACTION W/PHACO Right 06/03/2018   Procedure: CATARACT EXTRACTION PHACO AND INTRAOCULAR LENS PLACEMENT ([place]) RIGHT DIABETES;  Surgeon: Eulogio Bear, MD;  Location: [facility];  Service: Ophthalmology;  Laterality: Right;  Diabetes-insulin dependent  . CESAREAN SECTION     x2  . CHOLECYSTECTOMY    . EYE SURGERY      Family History  Problem Relation Age of Onset  . Heart disease Father   . Cancer Sister   . Cancer Brother   . Breast cancer Neg Hx      Social History   Tobacco Use  . Smoking status: Former Smoker    Packs/day: 1.00    Years: 20.00    Pack years: 20.00    Types: Cigarettes    Quit date: 08/26/2013    Years since quitting: 7.7  . Smokeless tobacco: Never Used  Vaping Use  . Vaping Use: Never used  Substance Use Topics  . Alcohol use: No    Alcohol/week: 0.0 standard drinks  . Drug use: No    No current facility-administered medications for this encounter.  Current Outpatient Medications:  .  amLODipine (NORVASC) 10 MG tablet, Take 1 tablet (10 mg total) by mouth daily., Disp: 90 tablet, Rfl: 4 .  amoxicillin-clavulanate (AUGMENTIN) 875-125 MG tablet, Take 1 tablet by mouth 2 (two) times daily. X 7 days, Disp: 14 tablet, Rfl: 0 .  atenolol (TENORMIN) 100 MG tablet, Take 1 tablet (100 mg total) by mouth daily., Disp: 90 tablet, Rfl: 4 .  BD PEN NEEDLE MICRO U/F 32G X 6 MM MISC, USE 4 TO 5 TIMES DAILY WITH INSULIN USE, Disp: 100 each, Rfl: 2 .  Blood Glucose Monitoring Suppl (ONETOUCH VERIO) w/Device KIT, Use to check blood sugar 2-3 times daily and document, bring results to provider visits., Disp: 1 kit, Rfl: 0 .  chlorhexidine (HIBICLENS) 4 % external liquid, Apply topically daily as needed. Dilute 10-15 mL in water, Use  daily when bathing for 1-2 weeks, Disp: 120 mL, Rfl: 0 .  Dulaglutide (TRULICITY) 1.5 IP/7.7PZ SOPN, Inject 1.5 mg into the skin once a week., Disp: 6 mL, Rfl: 3 .  glucose blood test strip, Use to check blood sugar 2-3 times daily and document, bring results to provider visits., Disp: 100 each, Rfl: 12 .  insulin degludec (TRESIBA FLEXTOUCH) 100 UNIT/ML FlexTouch Pen, Inject 40 Units into the skin daily., Disp: 9 mL, Rfl: 4 .  insulin lispro (HUMALOG KWIKPEN) 100 UNIT/ML KwikPen, Inject 0.25 mLs (25 Units total) into the skin 2 (two) times daily before a meal., Disp: 15 mL, Rfl: 11 .  JANUVIA 50 MG tablet, Take 1 tablet by mouth once daily, Disp: 90 tablet, Rfl: 2 .  JARDIANCE 25 MG TABS tablet,  TAKE 1 TABLET BY MOUTH ONCE DAILY BEFORE BREAKFAST, Disp: 90 tablet, Rfl: 0 .  Lancets (ONETOUCH ULTRASOFT) lancets, Use to check blood sugar 2-3 times daily and document, bring results to provider visits., Disp: 100 each, Rfl: 12 .  lovastatin (MEVACOR) 40 MG tablet, Take 1 tablet (40 mg total) by mouth daily with breakfast., Disp: 90 tablet, Rfl: 4 .  OneTouch Delica Lancets 96U MISC, 1 applicator by Does not apply route in the morning and at bedtime., Disp: 200 each, Rfl: 1 .  spironolactone (ALDACTONE) 25 MG tablet, Take 1 tablet by mouth once daily, Disp: 90 tablet, Rfl: 0  Allergies  Allergen Reactions  . Actos [Pioglitazone] Other (See Comments)    CHF  . Amlodipine Swelling    Leg swelling   . Benazepril Other (See Comments)    angioedema  . Hydrochlorothiazide Other (See Comments)    angioedema  . Metformin And Related Diarrhea     ROS  As noted in HPI.   Physical Exam  BP (!) 152/73 (BP Location: Right Arm)   Pulse 98   Temp 98.2 F (36.8 C) (Oral)   Resp 18   Ht _0  (1.6 m)   Wt 92.5 kg   SpO2 99%   BMI 36.12 kg/m   Constitutional: Well developed, well nourished, no acute distress Eyes:  EOMI, conjunctiva normal bilaterally HENT: Normocephalic, atraumatic,mucus membranes moist Respiratory: Normal inspiratory effort Cardiovascular: Normal rate GI: nondistended skin: No rash, skin intact Musculoskeletal: Mildly tender erythema at base of the left fifth MTP.  Large blood blister at the base of the lateral fifth MTP.  Skin otherwise intact.  No other tenderness over the entire rest of the foot.  Sensation grossly intact.  DP 2+.  No other foot swelling.  Ankle nontender.       Neurologic: Alert & oriented x 3, no focal neuro deficits Psychiatric: Speech and behavior appropriate   ED Course   Medications - No data to display  Orders Placed This Encounter  Procedures  . Aerobic Culture w Gram Stain (superficial specimen)    Standing Status:    Standing    Number of Occurrences:   1  . DG Foot Complete Left    Standing Status:   Standing    Number of Occurrences:   1    Order Specific Question:   Reason for Exam (SYMPTOM  OR DIAGNOSIS REQUIRED)    Answer:   Tenderness, erythema at fifth MTP joint.  Large blood blister.  Rule out acute fracture.  Osteopenia, diabetic.    No results found for this or any previous visit (from the past 24 hour(s)). DG Foot Complete Left  Result Date: 05/19/2021 CLINICAL DATA:  Tenderness and erythema EXAM: LEFT FOOT - COMPLETE 3+ VIEW COMPARISON:  None. FINDINGS: Frontal, oblique, and lateral views were obtained. There is a radiopaque foreign body measuring just over 3 mm in length lateral to the right proximal phalanx with soft tissue air in this area. No evident bony destruction. No fracture or dislocation. Hammertoe deformities noted involving the second, third, fourth, and fifth digits. There is no appreciable joint space narrowing or erosion. There are small posterior and inferior calcaneal spurs. There is pes cavus. IMPRESSION: Linear radiopaque foreign body lateral to the fifth proximal phalanx with associated air. Suspect soft tissue abscess in this area. No bony destruction. No fracture or dislocation. Hammertoe defects involving the second, third, fourth, and fifth digits. Calcaneal spurs noted. No appreciable joint space narrowing or erosion. There is pes cavus. These results will be called to the ordering clinician or representative by the Radiologist Assistant, and communication documented in the PACS or [employer]. Electronically Signed   By: Lowella Grip III M.D.   On: 05/19/2021 14:49    ED Clinical Impression  1. Foreign body in left foot, initial encounter   2. Abscess of left foot      ED Assessment/Plan  Given history of osteopenia diabetes and neuropathy, will x-ray foot to rule out any acute fractures.  Plan to rupture the blood blister under controlled circumstances and  leave the skin in place as a biologic dressing.  We will send home with Augmentin.  No dosage adjustment necessary.  Calculated creatinine clearance from labs on 03/2021 76 mL/min.  Reviewed imaging independently and discussed with radiology.  3 mm radiopaque foreign body lateral to the right proximal phalanx with soft tissue air in this area concerning for abscess.  No evident bony destruction, fracture or dislocation.  See radiology report for full details.  Discussed with Dr. Vickki Muff, podiatry on-call.  Recommends draining the blister, sending off for culture.  I am uncomfortable deroofing this, but we will send her home on Augmentin.  And is to follow-up with him or his partner here in [place] sometime next week.  ER return precautions given.  Procedure note: Cleaned area with chlorhexidine and alcohol.  Using a sterile 11 blade, made a single nick in the skin and expressed a copious amount of purulent, bloody, foul-smelling drainage.  Sending off for culture.  Placing dressing.  Leaving skin on as a biologic dressing.  Patient tolerated procedure well.  Final culture and sensitivity results pending at the time of signing of this note.  Initial growth Klebsiella pneumonia which is generally sensitive to Augmentin.  Discussed  imaging, MDM, treatment plan, and plan for follow-up with patient. Discussed sn/sx that should prompt return to the ED. patient agrees with plan.   Meds ordered this encounter  Medications  . amoxicillin-clavulanate (AUGMENTIN) 875-125 MG tablet    Sig: Take 1 tablet by mouth 2 (two) times daily. X 7 days    Dispense:  14 tablet    Refill:  0  . chlorhexidine (HIBICLENS) 4 % external liquid    Sig: Apply topically daily as needed. Dilute 10-15 mL in water, Use daily when bathing for 1-2 weeks    Dispense:  120 mL    Refill:  0      *This clinic note was created using [employer]. Therefore, there may be occasional mistakes despite careful  proofreading.  ?    Melynda Ripple, MD 05/21/21 1345

## 2021-05-19 NOTE — Discharge Instructions (Addendum)
Follow-up with Dr. Vickki Muff or his partner next week.  Dr. Vickki Muff is in the office on Monday Wednesdays and his partner is there on Tuesdays and Thursdays.  Keep this clean with Hibiclens, soap and water.  Keep it covered with a bandage.  Finish the Augmentin, even if you feel better.  You may take it because milligrams of Tylenol 3-4 times a day as needed for pain.  Go immediately to the ED for the signs and symptoms we discussed

## 2021-05-19 NOTE — Chronic Care Management (AMB) (Signed)
Chronic Care Management Pharmacy Assistant   Name: Tami Cummings  MRN: [id number] DOB: [birth year]   Reason for Encounter: Disease State Diabetes Mellitus    Recent office visits:  None noted  Recent consult visits:  None noted  Hospital visits:  None in previous 6 months  Medications: Outpatient Encounter Medications as of 05/19/2021  Medication Sig  . amLODipine (NORVASC) 10 MG tablet Take 1 tablet (10 mg total) by mouth daily.  Marland Kitchen atenolol (TENORMIN) 100 MG tablet Take 1 tablet (100 mg total) by mouth daily.  . BD PEN NEEDLE MICRO U/F 32G X 6 MM MISC USE 4 TO 5 TIMES DAILY WITH INSULIN USE  . Blood Glucose Monitoring Suppl (ONETOUCH VERIO) w/Device KIT Use to check blood sugar 2-3 times daily and document, bring results to provider visits.  . Dulaglutide (TRULICITY) 1.5 TJ/0.3ES SOPN Inject 1.5 mg into the skin once a week.  Marland Kitchen glucose blood test strip Use to check blood sugar 2-3 times daily and document, bring results to provider visits.  . insulin degludec (TRESIBA FLEXTOUCH) 100 UNIT/ML FlexTouch Pen Inject 40 Units into the skin daily.  . insulin lispro (HUMALOG KWIKPEN) 100 UNIT/ML KwikPen Inject 0.25 mLs (25 Units total) into the skin 2 (two) times daily before a meal.  . JANUVIA 50 MG tablet Take 1 tablet by mouth once daily  . JARDIANCE 25 MG TABS tablet TAKE 1 TABLET BY MOUTH ONCE DAILY BEFORE BREAKFAST  . Lancets (ONETOUCH ULTRASOFT) lancets Use to check blood sugar 2-3 times daily and document, bring results to provider visits.  Marland Kitchen lovastatin (MEVACOR) 40 MG tablet Take 1 tablet (40 mg total) by mouth daily with breakfast.  . OneTouch Delica Lancets 92Z MISC 1 applicator by Does not apply route in the morning and at bedtime.  Marland Kitchen spironolactone (ALDACTONE) 25 MG tablet Take 1 tablet by mouth once daily   No facility-administered encounter medications on file as of 05/19/2021.   Recent Relevant Labs: Lab Results  Component Value Date/Time   HGBA1C 9.6 (H)  04/04/2021 11:04 AM   HGBA1C 9.4 (H) 01/05/2021 11:12 AM   HGBA1C 9.1 (H) 10/15/2020 04:05 PM   HGBA1C 7.8 (H) 12/04/2018 11:06 AM   HGBA1C 7.9 07/13/2016 12:00 AM   MICROALBUR 30 (H) 01/05/2021 11:12 AM   MICROALBUR 10 10/15/2020 02:59 PM    Kidney Function Lab Results  Component Value Date/Time   CREATININE 0.96 04/04/2021 11:04 AM   CREATININE 0.91 01/05/2021 11:14 AM   GFRNONAA 63 01/05/2021 11:14 AM   GFRAA 72 01/05/2021 11:14 AM    . Current antihyperglycemic regimen:   Trulicity 1.5 mg q Monday  Tresiba 40 units qhs  Humalog 25 u bid   Jardiance 25 mg qd  Januvia 50 mg qd  . What recent interventions/DTPs have been made to improve glycemic control:  o None Noted  . Have there been any recent hospitalizations or ED visits since last visit with CPP? No   . Patient denies hypoglycemic symptoms, including Pale, Sweaty, Shaky, Hungry, Nervous/irritable and Vision changes   . Patient denies hyperglycemic symptoms, including blurry vision, excessive thirst, fatigue, polyuria and weakness   . How often are you checking your blood sugar? once daily   . What are your blood sugars ranging?  o Fasting:  o Before meals:  o After meals: 140-05/18/2021 o Bedtime:   . During the week, how often does your blood glucose drop below 70? Never   . Are you checking your feet daily/regularly?  Patient states she does check her feet daily she states she currently does have a lump on her left foot that appeared 3-4 days ago, she states her granddaughter will take her to urgent care today to get it looked at. I advised patientt to call her PCP for further evaluation as well.   Adherence Review: Is the patient currently on a STATIN medication? Yes Is the patient currently on ACE/ARB medication? No Does the patient have >5 day gap between last estimated fill dates? No   Star Rating Drugs: Januvia 50 mg Last filled:04/26/2021 None noted DS Jardiance 25 mg Last  filled:05/09/2021 90 DS Lovastatin 40 mg Last filled:02/23/2021 90 DS Tresiba 100 unit Last [id number]/15/[id number] 38 DS Trulicity 7.9YI/0.1KP Last filled:04/26/2021 None noted DS  Corrie Mckusick, [place] [phone]

## 2021-05-23 ENCOUNTER — Telehealth (HOSPITAL_COMMUNITY): Payer: Self-pay | Admitting: Emergency Medicine

## 2021-05-23 LAB — AEROBIC CULTURE W GRAM STAIN (SUPERFICIAL SPECIMEN): Gram Stain: NONE SEEN

## 2021-05-23 MED ORDER — CEPHALEXIN 500 MG PO CAPS: 500.0000 mg | ORAL_CAPSULE | Freq: Four times a day (QID) | ORAL | 0 refills | Status: AC

## 2021-05-31 ENCOUNTER — Other Ambulatory Visit: Payer: Self-pay | Admitting: Nurse Practitioner

## 2021-05-31 DIAGNOSIS — Z1231 Encounter for screening mammogram for malignant neoplasm of breast: Secondary | ICD-10-CM

## 2021-06-02 ENCOUNTER — Ambulatory Visit: Payer: Medicare PPO

## 2021-06-03 ENCOUNTER — Ambulatory Visit: Payer: Medicare Other

## 2021-06-08 ENCOUNTER — Telehealth: Payer: Self-pay

## 2021-06-08 NOTE — Telephone Encounter (Signed)
Pt stated she would call to make apt for the podiatry shoes when she calls please schedule with a DR only as she has to be seen by a DR not a NP

## 2021-06-10 ENCOUNTER — Ambulatory Visit (INDEPENDENT_AMBULATORY_CARE_PROVIDER_SITE_OTHER): Payer: Medicare Other

## 2021-06-10 VITALS — Ht 63.0 in | Wt 200.0 lb

## 2021-06-10 DIAGNOSIS — Z0000 Encounter for general adult medical examination without abnormal findings: Secondary | ICD-10-CM | POA: Diagnosis not present

## 2021-06-10 NOTE — Progress Notes (Signed)
I connected with Tami Cummings today by telephone and verified that I am speaking with the correct person using two identifiers. Location patient: home Location provider: work Persons participating in the virtual visit: Lisel Siegrist, Glenna Durand LPN.   I discussed the limitations, risks, security and privacy concerns of performing an evaluation and management service by telephone and the availability of in person appointments. I also discussed with the patient that there may be a patient responsible charge related to this service. The patient expressed understanding and verbally consented to this telephonic visit.    Interactive audio and video telecommunications were attempted between this provider and patient, however failed, due to patient having technical difficulties OR patient did not have access to video capability.  We continued and completed visit with audio only.     Vital signs may be patient reported or missing.  Subjective:   Tami Cummings is a 75 y.o. female who presents for Medicare Annual (Subsequent) preventive examination.  Review of Systems     Cardiac Risk Factors include: advanced age (>49mn, >>88women);diabetes mellitus;dyslipidemia;hypertension;obesity (BMI >30kg/m2);sedentary lifestyle     Objective:    Today's Vitals   06/10/21 0946  Weight: 200 lb (90.7 kg)  Height: '5\' 3"'  (1.6 m)   Body mass index is 35.43 kg/m.  Advanced Directives 06/10/2021 05/19/2021 05/27/2020 05/26/2019 06/03/2018 05/23/2018 09/20/2015  Does Patient Have a Medical Advance Directive? Yes No Yes Yes Yes No No  Type of Advance Directive Healthcare Power of [place] - -  Does patient want to make changes to medical advance directive? - - - - No - Patient declined - -  [place]? No - copy requested - No - copy requested - No - copy requested - -  Would patient  like information on creating a medical advance directive? - - - - - No - Patient declined No - patient declined information    Current Medications (verified) Outpatient Encounter Medications as of 06/10/2021  Medication Sig   amLODipine (NORVASC) 10 MG tablet Take 1 tablet (10 mg total) by mouth daily.   atenolol (TENORMIN) 100 MG tablet Take 1 tablet (100 mg total) by mouth daily.   BD PEN NEEDLE MICRO U/F 32G X 6 MM MISC USE 4 TO 5 TIMES DAILY WITH INSULIN USE   Blood Glucose Monitoring Suppl (ONETOUCH VERIO) w/Device KIT Use to check blood sugar 2-3 times daily and document, bring results to provider visits.   Dulaglutide (TRULICITY) 1.5 MYB/6.3SLSOPN Inject 1.5 mg into the skin once a week.   glucose blood test strip Use to check blood sugar 2-3 times daily and document, bring results to provider visits.   insulin degludec (TRESIBA FLEXTOUCH) 100 UNIT/ML FlexTouch Pen Inject 40 Units into the skin daily.   insulin lispro (HUMALOG KWIKPEN) 100 UNIT/ML KwikPen Inject 0.25 mLs (25 Units total) into the skin 2 (two) times daily before a meal.   JANUVIA 50 MG tablet Take 1 tablet by mouth once daily   JARDIANCE 25 MG TABS tablet TAKE 1 TABLET BY MOUTH ONCE DAILY BEFORE BREAKFAST   Lancets (ONETOUCH ULTRASOFT) lancets Use to check blood sugar 2-3 times daily and document, bring results to provider visits.   lovastatin (MEVACOR) 40 MG tablet Take 1 tablet (40 mg total) by mouth daily with breakfast.   OneTouch Delica Lancets 337DMISC 1 applicator by Does not apply route in the morning and  at bedtime.   spironolactone (ALDACTONE) 25 MG tablet Take 1 tablet by mouth once daily   chlorhexidine (HIBICLENS) 4 % external liquid Apply topically daily as needed. Dilute 10-15 mL in water, Use daily when bathing for 1-2 weeks (Patient not taking: Reported on 06/10/2021)   No facility-administered encounter medications on file as of 06/10/2021.    Allergies (verified) Actos [pioglitazone], Amlodipine,  Benazepril, Hydrochlorothiazide, and Metformin and related   History: Past Medical History:  Diagnosis Date   Arthritis    knee   COPD (chronic obstructive pulmonary disease) (HCC)    Diabetes mellitus without complication ([place])    type 2   Dyspnea    Hyperlipidemia    Hypertension    controlled   Neuromuscular disorder (HCC)    numbness in hands and feet   Shoulder pain, right    Wears dentures    upper and lower   Past Surgical History:  Procedure Laterality Date   ABDOMINAL HYSTERECTOMY     BREAST BIOPSY Right 04/12/2016   stereo bx/clip- neg   CARPAL TUNNEL RELEASE  06/17/00   CATARACT EXTRACTION W/PHACO Left 09/20/2015   Procedure: CATARACT EXTRACTION PHACO AND INTRAOCULAR LENS PLACEMENT ([place]);  Surgeon: Ronnell Freshwater, MD;  Location: [facility];  Service: Ophthalmology;  Laterality: Left;  DIABETIC - insulin   CATARACT EXTRACTION W/PHACO Right 06/03/2018   Procedure: CATARACT EXTRACTION PHACO AND INTRAOCULAR LENS PLACEMENT ([place]) RIGHT DIABETES;  Surgeon: Eulogio Bear, MD;  Location: [facility];  Service: Ophthalmology;  Laterality: Right;  Diabetes-insulin dependent   CESAREAN SECTION     x2   CHOLECYSTECTOMY     EYE SURGERY     Family History  Problem Relation Age of Onset   Heart disease Father    Cancer Sister    Cancer Brother    Breast cancer Neg Hx    Social History   Socioeconomic History   Marital status: Single    Spouse name: Not on file   Number of children: Not on file   Years of education: Not on file   Highest education level: High school graduate  Occupational History   Occupation: retired   Tobacco Use   Smoking status: Former    Packs/day: 1.00    Years: 20.00    Pack years: 20.00    Types: Cigarettes    Quit date: 08/26/2013    Years since quitting: 7.7   Smokeless tobacco: Never  Vaping Use   Vaping Use: Never used  Substance and Sexual Activity   Alcohol use: No    Alcohol/week: 0.0 standard drinks    Drug use: No   Sexual activity: Not on file  Other Topics Concern   Not on file  Social History Narrative   Daughter lives with her currently    Takes care of granddaughter    Social Determinants of Health   Financial Resource Strain: Low Risk    Difficulty of Paying Living Expenses: Not hard at all  Food Insecurity: No Food Insecurity   Worried About Charity fundraiser in the Last Year: Never true   Arboriculturist in the Last Year: Never true  Transportation Needs: No Transportation Needs   Lack of Transportation (Medical): No   Lack of Transportation (Non-Medical): No  Physical Activity: Inactive   Days of Exercise per Week: 0 days   Minutes of Exercise per Session: 0 min  Stress: No Stress Concern Present   Feeling of Stress : Not at all  Social Connections: Not on file    Tobacco Counseling Counseling given: Not Answered   Clinical Intake:  Pre-visit preparation completed: Yes  Pain : No/denies pain     Nutritional Status: BMI > 30  Obese Nutritional Risks: None Diabetes: Yes  How often do you need to have someone help you when you read instructions, pamphlets, or other written materials from your doctor or pharmacy?: 1 - Never What is the last grade level you completed in school?: 12th grade  Diabetic? Yes Nutrition Risk Assessment:  Has the patient had any N/V/D within the last 2 months?  No  Does the patient have any non-healing wounds?  No  Has the patient had any unintentional weight loss or weight gain?  No   Diabetes:  Is the patient diabetic?  Yes  If diabetic, was a CBG obtained today?  No  Did the patient bring in their glucometer from home?  No  How often do you monitor your CBG's? daily.   Financial Strains and Diabetes Management:  Are you having any financial strains with the device, your supplies or your medication? No .  Does the patient want to be seen by Chronic Care Management for management of their diabetes?  No  Would the  patient like to be referred to a Nutritionist or for Diabetic Management?  No   Diabetic Exams:  Diabetic Eye Exam: Completed 04/26/2021 Diabetic Foot Exam: Completed 10/15/2020   Interpreter Needed?: No  Information entered by :: NAllen LPN   Activities of Daily Living In your present state of health, do you have any difficulty performing the following activities: 06/10/2021 01/05/2021  Hearing? N N  Vision? N N  Difficulty concentrating or making decisions? N N  Walking or climbing stairs? N Y  Dressing or bathing? N N  Doing errands, shopping? N Y  Conservation officer, nature and eating ? N -  Using the Toilet? N -  In the past six months, have you accidently leaked urine? N -  Do you have problems with loss of bowel control? N -  Managing your Medications? N -  Managing your Finances? N -  Housekeeping or managing your Housekeeping? N -  Some recent data might be hidden    Patient Care Team: Jon Billings, NP as PCP - General Kem Parkinson, MD (Ophthalmology) Vanita Ingles, RN as [place] Management ([place] [facility]) Vladimir Faster, [facility] as Pharmacist (Pharmacist)  Indicate any recent Medical Services you may have received from other than [facility] providers in the past year (date may be approximate).     Assessment:   This is a routine wellness examination for Tami Cummings.  Hearing/Vision screen Vision Screening - Comments:: No regular eye exams,   Dietary issues and exercise activities discussed: Current Exercise Habits: The patient does not participate in regular exercise at present   Goals Addressed             This Visit's Progress    Patient Stated       06/10/2021, keep living        Depression Screen PHQ 2/9 Scores 06/10/2021 01/05/2021 05/27/2020 01/23/2020 05/23/2018 01/29/2018 10/16/2017  PHQ - 2 Score 0 0 0 0 0 0 0  PHQ- 9 Score - - - - - 1 -    Fall Risk Fall Risk  06/10/2021 05/27/2020 07/18/2019 05/23/2018 01/29/2018  Falls in the past  year? 0 1 1 No No  Number falls in past yr: - 0 0 - -  Injury  with Fall? - 0 0 - -  Risk for fall due to : Medication side effect - - - -  Follow up Falls evaluation completed;Education provided;Falls prevention discussed - Falls evaluation completed - -    FALL RISK PREVENTION PERTAINING TO THE HOME:  Any stairs in or around the home? Yes  If so, are there any without handrails? No  Home free of loose throw rugs in walkways, pet beds, electrical cords, etc? Yes  Adequate lighting in your home to reduce risk of falls? Yes   ASSISTIVE DEVICES UTILIZED TO PREVENT FALLS:  Life alert? No  Use of a cane, walker or w/c? Yes  Grab bars in the bathroom? No  Shower chair or bench in shower? No  Elevated toilet seat or a handicapped toilet? No   TIMED UP AND GO:  Was the test performed? No .      Cognitive Function:     6CIT Screen 06/10/2021 05/26/2019 05/23/2018  What Year? 0 points 0 points 0 points  What month? 0 points 0 points 0 points  What time? 0 points 0 points 0 points  Count back from 20 0 points 0 points 0 points  Months in reverse 4 points 0 points 0 points  Repeat phrase 2 points 2 points 2 points  Total Score '6 2 2    ' Immunizations Immunization History  Administered Date(s) Administered   Fluad Quad(high Dose 65+) 09/18/2020   Influenza, High Dose Seasonal PF 10/30/2016, 10/17/2017, 09/03/2018   Influenza,inj,Quad PF,6+ Mos 09/27/2015   Influenza-Unspecified 08/18/2014, 10/17/2017, 10/12/2019   PFIZER(Purple Top)SARS-COV-2 Vaccination 03/23/2020, 04/13/2020, 12/30/2020   Pneumococcal Conjugate-13 12/28/2014   Pneumococcal-Unspecified 12/30/2012   Td 05/23/2005, 03/14/2016   Zoster, Live 03/11/2014    TDAP status: Up to date  Flu Vaccine status: Up to date  Pneumococcal vaccine status: Up to date  Covid-19 vaccine status: Completed vaccines  Qualifies for Shingles Vaccine? Yes   Zostavax completed Yes   Shingrix Completed?: No.    Education has been  provided regarding the importance of this vaccine. Patient has been advised to call insurance company to determine out of pocket expense if they have not yet received this vaccine. Advised may also receive vaccine at local pharmacy or Health Dept. Verbalized acceptance and understanding.  Screening Tests Health Maintenance  Topic Date Due   Zoster Vaccines- Shingrix (1 of 2) Never done   COVID-19 Vaccine (4 - Booster for Pfizer series) 04/29/2021   INFLUENZA VACCINE  07/18/2021   HEMOGLOBIN A1C  10/04/2021   FOOT EXAM  10/15/2021   URINE MICROALBUMIN  01/05/2022   COLON CANCER SCREENING ANNUAL FOBT  01/06/2022   OPHTHALMOLOGY EXAM  04/26/2022   MAMMOGRAM  07/01/2022   COLONOSCOPY (Pts 45-17yr Insurance coverage will need to be confirmed)  10/24/2025   TETANUS/TDAP  03/14/2026   DEXA SCAN  Completed   Hepatitis C Screening  Completed   PNA vac Low Risk Adult  Completed   HPV VACCINES  Aged Out    Health Maintenance  Health Maintenance Due  Topic Date Due   Zoster Vaccines- Shingrix (1 of 2) Never done   COVID-19 Vaccine (4 - Booster for Pfizer series) 04/29/2021    Colorectal cancer screening: Type of screening: FOBT/FIT. Completed 01/06/2021. Repeat every 1 years  Mammogram status: scheduled for 07/04/2021  Bone Density status: Completed 03/27/2016.   Lung Cancer Screening: (Low Dose CT Chest recommended if Age 74-80years, 30 pack-year currently smoking OR have quit w/in 15years.) does qualify.  Lung Cancer Screening Referral: decline  Additional Screening:  Hepatitis C Screening: does qualify; Completed 03/14/2016  Vision Screening: Recommended annual ophthalmology exams for early detection of glaucoma and other disorders of the eye. Is the patient up to date with their annual eye exam?  Yes  Who is the provider or what is the name of the office in which the patient attends annual eye exams? Can't remember name If pt is not established with a provider, would they like  to be referred to a provider to establish care? No .   Dental Screening: Recommended annual dental exams for proper oral hygiene  Community Resource Referral / Chronic Care Management: CRR required this visit?  No   CCM required this visit?  No      Plan:     I have personally reviewed and noted the following in the patient's chart:   Medical and social history Use of alcohol, tobacco or illicit drugs  Current medications and supplements including opioid prescriptions.  Functional ability and status Nutritional status Physical activity Advanced directives List of other physicians Hospitalizations, surgeries, and ER visits in previous 12 months Vitals Screenings to include cognitive, depression, and falls Referrals and appointments  In addition, I have reviewed and discussed with patient certain preventive protocols, quality metrics, and best practice recommendations. A written personalized care plan for preventive services as well as general preventive health recommendations were provided to patient.     Kellie Simmering, LPN   [id number]   Nurse Notes:

## 2021-06-10 NOTE — Patient Instructions (Signed)
Tami Cummings , Thank you for taking time to come for your Medicare Wellness Visit. I appreciate your ongoing commitment to your health goals. Please review the following plan we discussed and let me know if I can assist you in the future.   Screening recommendations/referrals: Colonoscopy: FOBT 01/06/2021 Mammogram: scheduled 07/04/2021 Bone Density: completed 03/27/2016 Recommended yearly ophthalmology/optometry visit for glaucoma screening and checkup Recommended yearly dental visit for hygiene and checkup  Vaccinations: Influenza vaccine: completed 09/18/2020 Pneumococcal vaccine: completed 12/28/2014 Tdap vaccine: completed 03/14/2016, 03/14/2026 Shingles vaccine: discussed   Covid-19: 12/30/2020, 04/13/2020, 03/23/2020  Advanced directives: Please bring a copy of your POA (Power of Attorney) and/or Living Will to your next appointment.   Conditions/risks identified: none  Next appointment: Follow up in one year for your annual wellness visit    Preventive Care 65 Years and Older, Female Preventive care refers to lifestyle choices and visits with your health care provider that can promote health and wellness. What does preventive care include? A yearly physical exam. This is also called an annual well check. Dental exams once or twice a year. Routine eye exams. Ask your health care provider how often you should have your eyes checked. Personal lifestyle choices, including: Daily care of your teeth and gums. Regular physical activity. Eating a healthy diet. Avoiding tobacco and drug use. Limiting alcohol use. Practicing safe sex. Taking low-dose aspirin every day. Taking vitamin and mineral supplements as recommended by your health care provider. What happens during an annual well check? The services and screenings done by your health care provider during your annual well check will depend on your age, overall health, lifestyle risk factors, and family history of disease. Counseling   Your health care provider may ask you questions about your: Alcohol use. Tobacco use. Drug use. Emotional well-being. Home and relationship well-being. Sexual activity. Eating habits. History of falls. Memory and ability to understand (cognition). Work and work Statistician. Reproductive health. Screening  You may have the following tests or measurements: Height, weight, and BMI. Blood pressure. Lipid and cholesterol levels. These may be checked every 5 years, or more frequently if you are over 31 years old. Skin check. Lung cancer screening. You may have this screening every year starting at age 27 if you have a 30-pack-year history of smoking and currently smoke or have quit within the past 15 years. Fecal occult blood test (FOBT) of the stool. You may have this test every year starting at age 73. Flexible sigmoidoscopy or colonoscopy. You may have a sigmoidoscopy every 5 years or a colonoscopy every 10 years starting at age 63. Hepatitis C blood test. Hepatitis B blood test. Sexually transmitted disease (STD) testing. Diabetes screening. This is done by checking your blood sugar (glucose) after you have not eaten for a while (fasting). You may have this done every 1-3 years. Bone density scan. This is done to screen for osteoporosis. You may have this done starting at age 63. Mammogram. This may be done every 1-2 years. Talk to your health care provider about how often you should have regular mammograms. Talk with your health care provider about your test results, treatment options, and if necessary, the need for more tests. Vaccines  Your health care provider may recommend certain vaccines, such as: Influenza vaccine. This is recommended every year. Tetanus, diphtheria, and acellular pertussis (Tdap, Td) vaccine. You may need a Td booster every 10 years. Zoster vaccine. You may need this after age 47. Pneumococcal 13-valent conjugate (PCV13) vaccine. One dose is  recommended  after age 69. Pneumococcal polysaccharide (PPSV23) vaccine. One dose is recommended after age 41. Talk to your health care provider about which screenings and vaccines you need and how often you need them. This information is not intended to replace advice given to you by your health care provider. Make sure you discuss any questions you have with your health care provider. Document Released: 12/31/2015 Document Revised: 08/23/2016 Document Reviewed: 10/05/2015 Elsevier Interactive Patient Education  2017 [place] Prevention in the Home Falls can cause injuries. They can happen to people of all ages. There are many things you can do to make your home safe and to help prevent falls. What can I do on the outside of my home? Regularly fix the edges of walkways and driveways and fix any cracks. Remove anything that might make you trip as you walk through a door, such as a raised step or threshold. Trim any bushes or trees on the path to your home. Use bright outdoor lighting. Clear any walking paths of anything that might make someone trip, such as rocks or tools. Regularly check to see if handrails are loose or broken. Make sure that both sides of any steps have handrails. Any raised decks and porches should have guardrails on the edges. Have any leaves, snow, or ice cleared regularly. Use sand or salt on walking paths during winter. Clean up any spills in your garage right away. This includes oil or grease spills. What can I do in the bathroom? Use night lights. Install grab bars by the toilet and in the tub and shower. Do not use towel bars as grab bars. Use non-skid mats or decals in the tub or shower. If you need to sit down in the shower, use a plastic, non-slip stool. Keep the floor dry. Clean up any water that spills on the floor as soon as it happens. Remove soap buildup in the tub or shower regularly. Attach bath mats securely with double-sided non-slip rug tape. Do not  have throw rugs and other things on the floor that can make you trip. What can I do in the bedroom? Use night lights. Make sure that you have a light by your bed that is easy to reach. Do not use any sheets or blankets that are too big for your bed. They should not hang down onto the floor. Have a firm chair that has side arms. You can use this for support while you get dressed. Do not have throw rugs and other things on the floor that can make you trip. What can I do in the kitchen? Clean up any spills right away. Avoid walking on wet floors. Keep items that you use a lot in easy-to-reach places. If you need to reach something above you, use a strong step stool that has a grab bar. Keep electrical cords out of the way. Do not use floor polish or wax that makes floors slippery. If you must use wax, use non-skid floor wax. Do not have throw rugs and other things on the floor that can make you trip. What can I do with my stairs? Do not leave any items on the stairs. Make sure that there are handrails on both sides of the stairs and use them. Fix handrails that are broken or loose. Make sure that handrails are as long as the stairways. Check any carpeting to make sure that it is firmly attached to the stairs. Fix any carpet that is loose or worn. Avoid having  throw rugs at the top or bottom of the stairs. If you do have throw rugs, attach them to the floor with carpet tape. Make sure that you have a light switch at the top of the stairs and the bottom of the stairs. If you do not have them, ask someone to add them for you. What else can I do to help prevent falls? Wear shoes that: Do not have high heels. Have rubber bottoms. Are comfortable and fit you well. Are closed at the toe. Do not wear sandals. If you use a stepladder: Make sure that it is fully opened. Do not climb a closed stepladder. Make sure that both sides of the stepladder are locked into place. Ask someone to hold it for  you, if possible. Clearly mark and make sure that you can see: Any grab bars or handrails. First and last steps. Where the edge of each step is. Use tools that help you move around (mobility aids) if they are needed. These include: Canes. Walkers. Scooters. Crutches. Turn on the lights when you go into a dark area. Replace any light bulbs as soon as they burn out. Set up your furniture so you have a clear path. Avoid moving your furniture around. If any of your floors are uneven, fix them. If there are any pets around you, be aware of where they are. Review your medicines with your doctor. Some medicines can make you feel dizzy. This can increase your chance of falling. Ask your doctor what other things that you can do to help prevent falls. This information is not intended to replace advice given to you by your health care provider. Make sure you discuss any questions you have with your health care provider. Document Released: 09/30/2009 Document Revised: 05/11/2016 Document Reviewed: 01/08/2015 Elsevier Interactive Patient Education  2017 [employer].

## 2021-07-04 ENCOUNTER — Ambulatory Visit
Admission: RE | Admit: 2021-07-04 | Discharge: 2021-07-04 | Disposition: A | Discharge status: Home / Self Care | Payer: Medicare Other | Source: Ambulatory Visit | Attending: Nurse Practitioner | Admitting: Nurse Practitioner

## 2021-07-04 ENCOUNTER — Other Ambulatory Visit: Payer: Self-pay

## 2021-07-04 DIAGNOSIS — Z1231 Encounter for screening mammogram for malignant neoplasm of breast: Secondary | ICD-10-CM | POA: Insufficient documentation

## 2021-07-05 ENCOUNTER — Ambulatory Visit: Payer: Medicare Other | Admitting: Nurse Practitioner

## 2021-07-06 NOTE — Progress Notes (Signed)
Please let patient know her Mammogram did not show any evidence of a malignancy.  The recommendation is to repeat the Mammogram in 1 year.  

## 2021-07-08 ENCOUNTER — Telehealth: Payer: Self-pay | Admitting: General Practice

## 2021-07-08 ENCOUNTER — Ambulatory Visit (INDEPENDENT_AMBULATORY_CARE_PROVIDER_SITE_OTHER): Payer: Medicare Other | Admitting: General Practice

## 2021-07-08 DIAGNOSIS — E1169 Type 2 diabetes mellitus with other specified complication: Secondary | ICD-10-CM | POA: Diagnosis not present

## 2021-07-08 DIAGNOSIS — E785 Hyperlipidemia, unspecified: Secondary | ICD-10-CM

## 2021-07-08 DIAGNOSIS — E1142 Type 2 diabetes mellitus with diabetic polyneuropathy: Secondary | ICD-10-CM | POA: Diagnosis not present

## 2021-07-08 DIAGNOSIS — I152 Hypertension secondary to endocrine disorders: Secondary | ICD-10-CM

## 2021-07-08 DIAGNOSIS — E1159 Type 2 diabetes mellitus with other circulatory complications: Secondary | ICD-10-CM | POA: Diagnosis not present

## 2021-07-08 NOTE — Chronic Care Management (AMB) (Signed)
Chronic Care Management   CCM RN Visit Note  07/08/2021 Name: Tami Cummings MRN: [id number] DOB: [birth year]  Subjective: Tami Cummings is a 75 y.o. year old female who is a primary care patient of Jon Billings, NP. The care management team was consulted for assistance with disease management and care coordination needs.    Engaged with patient by telephone for follow up visit in response to provider referral for case management and/or care coordination services.   Consent to Services:  The patient was given information about Chronic Care Management services, agreed to services, and gave verbal consent prior to initiation of services.  Please see initial visit note for detailed documentation.   Patient agreed to services and verbal consent obtained.   Assessment: Review of patient past medical history, allergies, medications, health status, including review of consultants reports, laboratory and other test data, was performed as part of comprehensive evaluation and provision of chronic care management services.   SDOH (Social Determinants of Health) assessments and interventions performed:    CCM Care Plan  Allergies  Allergen Reactions   Actos [Pioglitazone] Other (See Comments)    CHF   Amlodipine Swelling    Leg swelling    Benazepril Other (See Comments)    angioedema   Hydrochlorothiazide Other (See Comments)    angioedema   Metformin And Related Diarrhea    Outpatient Encounter Medications as of 07/08/2021  Medication Sig   amLODipine (NORVASC) 10 MG tablet Take 1 tablet (10 mg total) by mouth daily.   atenolol (TENORMIN) 100 MG tablet Take 1 tablet (100 mg total) by mouth daily.   BD PEN NEEDLE MICRO U/F 32G X 6 MM MISC USE 4 TO 5 TIMES DAILY WITH INSULIN USE   Blood Glucose Monitoring Suppl (ONETOUCH VERIO) w/Device KIT Use to check blood sugar 2-3 times daily and document, bring results to provider visits.   chlorhexidine (HIBICLENS) 4 % external  liquid Apply topically daily as needed. Dilute 10-15 mL in water, Use daily when bathing for 1-2 weeks (Patient not taking: Reported on 06/10/2021)   Dulaglutide (TRULICITY) 1.5 DP/8.2UM SOPN Inject 1.5 mg into the skin once a week.   glucose blood test strip Use to check blood sugar 2-3 times daily and document, bring results to provider visits.   insulin degludec (TRESIBA FLEXTOUCH) 100 UNIT/ML FlexTouch Pen Inject 40 Units into the skin daily.   insulin lispro (HUMALOG KWIKPEN) 100 UNIT/ML KwikPen Inject 0.25 mLs (25 Units total) into the skin 2 (two) times daily before a meal.   JANUVIA 50 MG tablet Take 1 tablet by mouth once daily   JARDIANCE 25 MG TABS tablet TAKE 1 TABLET BY MOUTH ONCE DAILY BEFORE BREAKFAST   Lancets (ONETOUCH ULTRASOFT) lancets Use to check blood sugar 2-3 times daily and document, bring results to provider visits.   lovastatin (MEVACOR) 40 MG tablet Take 1 tablet (40 mg total) by mouth daily with breakfast.   OneTouch Delica Lancets 35T MISC 1 applicator by Does not apply route in the morning and at bedtime.   spironolactone (ALDACTONE) 25 MG tablet Take 1 tablet by mouth once daily   No facility-administered encounter medications on file as of 07/08/2021.    Patient Active Problem List   Diagnosis Date Noted   Osteopenia 01/01/2021   Vitamin D deficiency 10/18/2020   Vitamin B12 deficiency 10/18/2020   CKD (chronic kidney disease) stage 3, GFR 30-59 ml/min ([place]) 10/08/2020   Senile purpura ([place]) 04/10/2017   Morbid obesity ([place]) 04/10/2017  Advance care planning 04/10/2017   Noncompliance 10/30/2016   DM type 2 with diabetic peripheral neuropathy ([place])    Hyperlipidemia associated with type 2 diabetes mellitus ([place])    Hypertension associated with diabetes ([place])     Conditions to be addressed/monitored:HTN, HLD, and DMII  Care Plan : RNCM: Diabetes Type 2 (Adult)  Updates made by Vanita Ingles since 07/08/2021 12:00 AM     Problem: RNCM: Glycemic  Management (Diabetes, Type 2)   Priority: High     Long-Range Goal: RNCM: Glycemic Management Optimized   Start Date: 02/09/2021  Expected End Date: 07/03/2022  This Visit's Progress: On track  Priority: High  Note:   Objective:  Lab Results  Component Value Date   HGBA1C 9.6 (H) 04/04/2021    Lab Results  Component Value Date   CREATININE 0.96 04/04/2021    No results found for: EGFR Current Barriers:  Knowledge Deficits related to basic Diabetes pathophysiology and self care/management Knowledge Deficits related to medications used for management of diabetes Literacy barriers Does not use cbg meter- on a consistent basis  Limited Social Support Non-compliance  Unable to self administer medications as prescribed Does not attend all scheduled provider appointments Does not adhere to prescribed medication regimen Lacks social connections Does not maintain contact with provider office Does not contact provider office for questions/concerns Case Manager Clinical Goal(s):  patient will demonstrate improved adherence to prescribed treatment plan for diabetes self care/management as evidenced by: daily monitoring and recording of CBG  adherence to ADA/ carb modified diet exercise 3/4 days/week adherence to prescribed medication regimen contacting provider for new or worsened symptoms or questions Interventions:  Collaboration with Jon Billings, NP regarding development and update of comprehensive plan of care as evidenced by provider attestation and co-signature Inter-disciplinary care team collaboration (see longitudinal plan of care) Provided education to patient about basic DM disease process. 07-08-2021: Extensive review and reeducation on DM and the effects of uncontrolled DM on body systems. The patient states she is compliant but her hemoglobin A1C is elevated to 9.6. Education on goal of hemoglobin A1C of <7.0. Reviewed medications with patient and discussed importance of  medication adherence. 07-08-2021: The patient states compliance with the current medications regimen. The patient is currently working with the pharmacist also. The patient was able to tell the RNCM her current medications regimen.  Discussed plans with patient for ongoing care management follow up and provided patient with direct contact information for care management team Provided patient with written educational materials related to hypo and hyperglycemia and importance of correct treatment. Reviewed with the patient sx/sx to look for with hypo/hyperglycemia. The patient denies any issues and says she feels "great"  07-08-2021: Denies any lows at this time. Reviewed scheduled/upcoming provider appointments including: 07-12-2021 at 1020 am Advised patient, providing education and rationale, to check cbg BID and record, calling pcp for findings outside established parameters.  The patient has not checked her blood sugars because she does not know how to work the meter. Trouble shooting attempted. The patient is going to see if her daughter can help her figure it out, or she will take it back to the pharmacy and get them to help her review how to use. Will touch base with pham D also to see if any recommendations. Expressed the importance of blood sugar checks especially since there has been a change in her medications. The patient verbalized understanding. Goal for blood sugars: fasting <130 and post prandial <180. 07-08-2021: The patient is  still unable to provide a list of readings for the [facility]. She states that she has not taken her blood sugar this am and does not take every day because of caring for her grandchildren. Expressed concern that she is not taking consistently. This has been an ongoing concern with the patient. She states yesterday it was 173 before supper.  Education on the rational for checking blood sugars and recording. Reeducation on fasting blood sugar <130 and post prandial <180. The patient  needs frequent reminders to check blood sugars. Reviewed the goal of hemoglobin A1C of 7.0 or less. Discussed how her A1C has went in the higher range and the importance of medication compliance, exercise and dietary restrictions. The patient states she stopped eating sweets 2 weeks ago and she is doing well without eating sweets. Praised the patient for positive change and gave her alternatives to try when she has a sweet craving. Also educated the patient on taking blood sugars more frequently and recording, having record to take with her to the pcp on visit coming up on 07-12-2021 Review of patient status, including review of consultants reports, relevant laboratory and other test results, and medications completed. Patient Goals: - barriers to adherence to treatment plan identified - blood glucose monitoring encouraged - blood glucose readings reviewed - individualized medical nutrition therapy provided - mutual A1C goal set or reviewed - resources required to improve adherence to care identified - self-awareness of signs/symptoms of hypo or hyperglycemia encouraged - use of blood glucose monitoring log promoted Self-Care Activities - UNABLE to independently manage DM Attends all scheduled provider appointments Checks blood sugars as prescribed and utilize hyper and hypoglycemia protocol as needed Adheres to prescribed ADA/carb modified Follow Up Plan: Telephone follow up appointment with care management team member scheduled for: 08-19-2021 at 1030 am     Task: RNCM: Alleviate Barriers to Glycemic Management Completed 07/08/2021  Outcome: Positive  Note:   Care Management Activities:    - barriers to adherence to treatment plan identified - blood glucose monitoring encouraged - blood glucose readings reviewed - individualized medical nutrition therapy provided - mutual A1C goal set or reviewed - resources required to improve adherence to care identified - self-awareness of  signs/symptoms of hypo or hyperglycemia encouraged - use of blood glucose monitoring log promoted        Care Plan : RNCM: Hypertension (Adult)  Updates made by Vanita Ingles since 07/08/2021 12:00 AM     Problem: RNCM: Hypertension (Hypertension)   Priority: Medium     Long-Range Goal: RNCM: Hypertension Monitored   Start Date: 02/09/2021  Expected End Date: 05/04/2022  This Visit's Progress: On track  Priority: Medium  Note:   Objective:  Last practice recorded BP readings:  BP Readings from Last 3 Encounters:  05/19/21 (!) 152/73  04/04/21 136/74  02/04/21 129/66   Most recent eGFR/CrCl:  Lab Results  Component Value Date   EGFR 62 04/04/2021    No components found for: CRCL Current Barriers:  Knowledge Deficits related to basic understanding of hypertension pathophysiology and self care management Knowledge Deficits related to understanding of medications prescribed for management of hypertension Non-adherence to prescribed medication regimen Non-adherence to scheduled provider appointments Unable to independently manage HTN Does not attend all scheduled provider appointments Does not adhere to prescribed medication regimen Does not contact provider office for questions/concerns Case Manager Clinical Goal(s):  patient will verbalize understanding of plan for hypertension management patient will attend all scheduled medical appointments: 07-12-2021 at 1020 am  patient will demonstrate improved adherence to prescribed treatment plan for hypertension as evidenced by taking all medications as prescribed, monitoring and recording blood pressure as directed, adhering to low sodium/DASH diet patient will demonstrate improved health management independence as evidenced by checking blood pressure as directed and notifying PCP if SBP>160 or DBP > 90, taking all medications as prescribe, and adhering to a low sodium diet as discussed. patient will verbalize basic understanding of  hypertension disease process and self health management plan as evidenced by compliance with medications, compliance with heart healthy/ADA diet and working with the CCM team to effectively manage health and well being  Interventions:  Collaboration with Jon Billings, NP regarding development and update of comprehensive plan of care as evidenced by provider attestation and co-signature Inter-disciplinary care team collaboration (see longitudinal plan of care) Evaluation of current treatment plan related to hypertension self management and patient's adherence to plan as established by provider. Provided education to patient re: stroke prevention, s/s of heart attack and stroke, DASH diet, complications of uncontrolled blood pressure Reviewed medications with patient and discussed importance of compliance Discussed plans with patient for ongoing care management follow up and provided patient with direct contact information for care management team Advised patient, providing education and rationale, to monitor blood pressure daily and record, calling PCP for findings outside established parameters.  Reviewed scheduled/upcoming provider appointments including: 07-12-2021 at 1020 am Self-Care Activities: - Self administers medications as prescribed Attends all scheduled provider appointments Calls provider office for new concerns, questions, or BP outside discussed parameters Checks BP and records as discussed Follows a low sodium diet/DASH diet Patient Goals: - check blood pressure weekly - choose a place to take my blood pressure (home, clinic or office, retail store) - write blood pressure results in a log or diary - agree on reward when goals are met - agree to work together to make changes - ask questions to understand - have a family meeting to talk about healthy habits - learn about high blood pressure  Follow Up Plan: Telephone follow up appointment with care management team member  scheduled for: 08-19-2021 at 1030 am    Task: RNCM: Identify and Monitor Blood Pressure Elevation Completed 07/08/2021  Outcome: Positive  Note:   Care Management Activities:    - blood pressure equipment and technique reviewed - blood pressure trends reviewed - depression screen reviewed - home or ambulatory blood pressure monitoring encouraged    Notes: The patient states she can not find the cuff end to her blood pressure cuff. The patient advised to use OTC benefit through [facility] to get new cuff. The patient will check into this     Care Plan : RNCM: HLD Management  Updates made by Vanita Ingles since 07/08/2021 12:00 AM     Problem: RNCM: Management of HLD   Priority: Medium     Long-Range Goal: RNCM: HLD Management   Start Date: 02/09/2021  This Visit's Progress: On track  Priority: Medium  Note:   Current Barriers:  Poorly controlled hyperlipidemia, complicated by uncontrolled DM Current antihyperlipidemic regimen: Lovastatin 40 mg QD Most recent lipid panel:     Component Value Date/Time   CHOL 127 01/05/2021 1114   CHOL 137 12/04/2018 1106   TRIG 112 01/05/2021 1114   TRIG 262 (H) 12/04/2018 1106   HDL 37 (L) 01/05/2021 1114   CHOLHDL 4.0 04/10/2017 1347   VLDL 52 (H) 12/04/2018 1106   LDLCALC 69 01/05/2021 1114   ASCVD risk enhancing conditions:  age >89, DM, HTN, CKD, former smoker Unable to self administer medications as prescribed Does not attend all scheduled provider appointments Does not adhere to prescribed medication regimen Lacks social connections Does not contact provider office for questions/concerns  RN Care Manager Clinical Goal(s):  Over the next 120 days, patient will work with Consulting civil engineer, providers, and care team towards execution of optimized self-health management plan patient will verbalize understanding of plan for effective management of HLD  patient will work with [facility] and pcp  to address needs related to HLD  patient will attend  all scheduled medical appointments: 07-05-2021  Interventions: Collaboration with Jon Billings, NP regarding development and update of comprehensive plan of care as evidenced by provider attestation and co-signature Inter-disciplinary care team collaboration (see longitudinal plan of care) Medication review performed; medication list updated in electronic medical record.  Inter-disciplinary care team collaboration (see longitudinal plan of care) Referred to pharmacy team for assistance with HLD medication management Evaluation of current treatment plan related to HLD  and patient's adherence to plan as established by provider. 07-08-2021: The patient is doing well with her diet at this time. The patient states she has been more mindful of what she is eating. The patient has cut out sweets from her diet. Denies any acute issues. Will continue to monitor for changes.  Advised patient to call the office for changes or questions. 05-13-2021: The patient states that she is doing well and denies any new concerns with management of her HLD. States she is staying active and following her medications regimen.  Provided education to patient re: heart healthy diet, medications compliance, working with the CCM team  Reviewed medications with patient and discussed compliance, the patient endorses taking medications as directed Reviewed scheduled/upcoming provider appointments including: 07-12-2021 at 1020 am Discussed plans with patient for ongoing care management follow up and provided patient with direct contact information for care management team   Patient Goals/Self-Care Activities: Over the next 120 days, patient will:   - call for medicine refill 2 or 3 days before it runs out - call if I am sick and can't take my medicine - keep a list of all the medicines I take; vitamins and herbals too - learn to read medicine labels - use a pillbox to sort medicine - use an alarm clock or phone to remind me to  take my medicine - change to whole grain breads, cereal, pasta - drink 6 to 8 glasses of water each day - eat 3 to 5 servings of fruits and vegetables each day - eat 5 or 6 small meals each day - fill half the plate with nonstarchy vegetables - limit fast food meals to no more than 1 per week - manage portion size - prepare main meal at home 3 to 5 days each week - read food labels for fat, fiber, carbohydrates and portion size - be open to making changes - I can manage, know and watch for signs of a heart attack - if I have chest pain, call for help - learn about small changes that will make a big difference - learn my personal risk factors   Follow Up Plan: Telephone follow up appointment with care management team member scheduled for: 08-19-2021 at 1030 am      Task: Mutually Develop and Royce Macadamia Achievement of Patient Goals Completed 07/08/2021  Outcome: Positive  Note:   Care Management Activities:    - barriers to meeting goals identified - change-talk evoked - choices  provided - collaboration with team encouraged - decision-making supported - difficulty of making life-long changes acknowledged - health risks reviewed - problem-solving facilitated - questions answered - readiness for change evaluated - reassurance provided - self-reflection promoted - self-reliance encouraged - verbalization of feelings encouraged    Notes:      Plan:Telephone follow up appointment with care management team member scheduled for:  08-19-2021 at 43 am  [place], MSN, [facility] Mobile: [phone]

## 2021-07-08 NOTE — Patient Instructions (Signed)
Visit Information  PATIENT GOALS:  Goals Addressed             This Visit's Progress    RNCM: Monitor and Manage My Blood Sugar-Diabetes Type 2       Timeframe:  Long-Range Goal Priority:  High Start Date: 02-09-2021                             Expected End Date:    06-16-2022                 Follow Up Date 08-19-2021   - check blood sugar at prescribed times - check blood sugar before and after exercise - check blood sugar if I feel it is too high or too low - enter blood sugar readings and medication or insulin into daily log - take the blood sugar log to all doctor visits   -take meter to the pharmacy and have pharmacy show how to use. The patient is having a hard time with the glucose meter and using. Education and support given. 07-08-2021: Is checking blood sugars now but not consistently  Why is this important?   Checking your blood sugar at home helps to keep it from getting very high or very low.  Writing the results in a diary or log helps the doctor know how to care for you.  Your blood sugar log should have the time, date and the results.  Also, write down the amount of insulin or other medicine that you take.  Other information, like what you ate, exercise done and how you were feeling, will also be helpful.     Notes: Extensive education today on making sure she is checking her blood sugars at home. Has not been doing this. Is not familiar with the meter and how to use. Encouraged her to take to the pharmacy today or try to get her daughter to help  her with using the meter. 05-13-2021: The patient states that she is using her glucose meter but had not taken her blood sugars today. She was concerned about the storms. She states it was 140 something yesterday. She did not have a log book available. Extensive education and reenforcement of the need to consistently check blood sugars and record. Also review of Hemoglobin A1C and the goal being 7.0 or less. The patients last A1C  on 04-04-2021 was 9.6, up from 9.1. 07-08-2021: The patient remains noncompliant with checking blood sugars on a consistent basis. States her blood sugar before supper last night was 173. Review of blood sugars fasting is <130 and <180 post prandial.  The patient states she doesn't check her blood sugars every day. Education on the need to check blood sugars and record. Reminded patient of her appointment coming up with pcp on 07-12-2021 at 1020 and to have readings with her. The patient stopped eating sweets 2 weeks ago and states she is doing better with food choices.      RNCM: Track and Manage My Blood Pressure-Hypertension       Timeframe:  Long-Range Goal Priority:  Medium Start Date:        02-09-2021                     Expected End Date:       07-06-2022                Follow Up: 08-19-2021    - check  blood pressure 3 times per week - write blood pressure results in a log or diary    Why is this important?   You won't feel high blood pressure, but it can still hurt your blood vessels.  High blood pressure can cause heart or kidney problems. It can also cause a stroke.  Making lifestyle changes like losing a little weight or eating less salt will help.  Checking your blood pressure at home and at different times of the day can help to control blood pressure.  If the doctor prescribes medicine remember to take it the way the doctor ordered.  Call the office if you cannot afford the medicine or if there are questions about it.     Notes: 07-08-2021: The patient is not consistently checking blood pressures at home. The patient states she can't with the grandchildren. Encouraged the patient to check blood pressures weekly and record.         The patient verbalized understanding of instructions, educational materials, and care plan provided today and declined offer to receive copy of patient instructions, educational materials, and care plan.   Telephone follow up appointment with care  management team member scheduled for: 08-19-2021 at 63 am  Noreene Larsson RN, MSN, [facility] Mobile: [phone]

## 2021-07-11 NOTE — Progress Notes (Signed)
BP (!) 146/75   Pulse 80   Temp 97.8 F (36.6 C)   Wt 201 lb 2 oz (91.2 kg)   SpO2 94%   BMI 35.63 kg/m    Subjective:    [name], female    DOB: 12/19/1945, 75 y.o.   MRN: [id number]  HPI: Tami Cummings is a 75 y.o. female  Chief Complaint  Patient presents with   Hypertension   Hyperlipidemia   Diabetes   HYPERTENSION / [place] Satisfied with current treatment? no Duration of hypertension: years BP monitoring frequency: not checking BP range:  BP medication side effects: no Past BP meds: amlodipine and atenolol Duration of hyperlipidemia: years Cholesterol medication side effects: no Cholesterol supplements: none Past cholesterol medications: lovastatin (mevacor) Medication compliance: excellent compliance Aspirin: no Recent stressors: no Recurrent headaches: no Visual changes: no Palpitations: no Dyspnea: no Chest pain: no Lower extremity edema: no Dizzy/lightheaded: no  DIABETES Hypoglycemic episodes:no Polydipsia/polyuria: no Visual disturbance: no Chest pain: no Paresthesias: no Glucose Monitoring: yes  Accucheck frequency:  not sure what the numbers are  Fasting glucose:  Post prandial:  Evening:  Before meals: Taking Insulin?: no  Long acting insulin:  Short acting insulin: Blood Pressure Monitoring: not checking Retinal Examination: Not up to Date Foot Exam: Up to Date Diabetic Education: Not Completed Pneumovax: Up to Date Influenza: Up to Date Aspirin: no  Relevant past medical, surgical, family and social history reviewed and updated as indicated. Interim medical history since our last visit reviewed. Allergies and medications reviewed and updated.  Review of Systems  Eyes:  Negative for visual disturbance.  Respiratory:  Negative for cough, chest tightness and shortness of breath.   Cardiovascular:  Negative for chest pain, palpitations and leg swelling.  Endocrine: Negative for polydipsia  and polyuria.  Neurological:  Negative for dizziness, numbness and headaches.   Per HPI unless specifically indicated above     Objective:    BP (!) 146/75   Pulse 80   Temp 97.8 F (36.6 C)   Wt 201 lb 2 oz (91.2 kg)   SpO2 94%   BMI 35.63 kg/m   Wt Readings from Last 3 Encounters:  07/12/21 201 lb 2 oz (91.2 kg)  06/10/21 200 lb (90.7 kg)  05/19/21 203 lb 14.8 oz (92.5 kg)    Physical Exam Vitals and nursing note reviewed.  Constitutional:      General: She is not in acute distress.    Appearance: Normal appearance. She is obese. She is not ill-appearing, toxic-appearing or diaphoretic.  HENT:     Head: Normocephalic.     Right Ear: External ear normal.     Left Ear: External ear normal.     Nose: Nose normal.     Mouth/Throat:     Mouth: Mucous membranes are moist.     Pharynx: Oropharynx is clear.  Eyes:     General:        Right eye: No discharge.        Left eye: No discharge.     Extraocular Movements: Extraocular movements intact.     Conjunctiva/sclera: Conjunctivae normal.     Pupils: Pupils are equal, round, and reactive to light.  Cardiovascular:     Rate and Rhythm: Normal rate and regular rhythm.     Heart sounds: No murmur heard. Pulmonary:     Effort: Pulmonary effort is normal. No respiratory distress.     Breath sounds: Normal breath sounds. No wheezing or rales.  Musculoskeletal:     Cervical back: Normal range of motion and neck supple.  Skin:    General: Skin is warm and dry.     Capillary Refill: Capillary refill takes less than 2 seconds.  Neurological:     General: No focal deficit present.     Mental Status: She is alert and oriented to person, place, and time. Mental status is at baseline.  Psychiatric:        Mood and Affect: Mood normal.        Behavior: Behavior normal.        Thought Content: Thought content normal.        Judgment: Judgment normal.    Results for orders placed or performed during the hospital encounter of  05/19/21  Aerobic Culture w Gram Stain (superficial specimen)   Specimen: Foot  Result Value Ref Range   Specimen Description      FOOT LEFT Performed at [facility], [address], [place], [place] [address]    Special Requests      NONE Performed at [facility], [address], [place], [place] [address]    Gram Stain      NO WBC SEEN RARE GRAM VARIABLE ROD Performed at [facility], [place] [address], [place], [place] [address]    Culture      ABUNDANT KLEBSIELLA PNEUMONIAE ABUNDANT ESCHERICHIA HERMANNII    Report Status 05/23/2021 FINAL    Organism ID, Bacteria KLEBSIELLA PNEUMONIAE    Organism ID, Bacteria ESCHERICHIA HERMANNII       Susceptibility   Escherichia hermannii - MIC*    AMPICILLIN >=32 RESISTANT Resistant     CEFAZOLIN <=4 SENSITIVE Sensitive     CEFEPIME <=0.12 SENSITIVE Sensitive     CEFTAZIDIME <=1 SENSITIVE Sensitive     CEFTRIAXONE <=0.25 SENSITIVE Sensitive     CIPROFLOXACIN <=0.25 SENSITIVE Sensitive     GENTAMICIN <=1 SENSITIVE Sensitive     IMIPENEM <=0.25 SENSITIVE Sensitive     TRIMETH/SULFA <=20 SENSITIVE Sensitive     AMPICILLIN/SULBACTAM <=2 SENSITIVE Sensitive     PIP/TAZO <=4 SENSITIVE Sensitive     * ABUNDANT ESCHERICHIA HERMANNII   Klebsiella pneumoniae - MIC*    AMPICILLIN >=32 RESISTANT Resistant     CEFAZOLIN <=4 SENSITIVE Sensitive     CEFEPIME <=0.12 SENSITIVE Sensitive     CEFTAZIDIME <=1 SENSITIVE Sensitive     CEFTRIAXONE <=0.25 SENSITIVE Sensitive     CIPROFLOXACIN <=0.25 SENSITIVE Sensitive     GENTAMICIN <=1 SENSITIVE Sensitive     IMIPENEM <=0.25 SENSITIVE Sensitive     TRIMETH/SULFA <=20 SENSITIVE Sensitive     AMPICILLIN/SULBACTAM 4 SENSITIVE Sensitive     PIP/TAZO <=4 SENSITIVE Sensitive     * ABUNDANT KLEBSIELLA PNEUMONIAE      Assessment & Plan:   Problem List Items Addressed This Visit       Cardiovascular and Mediastinum   Hypertension associated with diabetes ([place]) -  Primary    Chronic.  Elevated at visit today. Not checking blood pressures at home.  Will add hydralazine 40m BID. Discussed side effects and benefits of medication with patient. Follow up in 1 month for reevaluation.        Relevant Medications   hydrALAZINE (APRESOLINE) 10 MG tablet   Other Relevant Orders   Comp Met (CMET)     Endocrine   DM type 2 with diabetic peripheral neuropathy (HCC)    Chronic.  Controlled.  Will make recommendations based on lab  results.  Labs ordered today.  Return to clinic in 3 months for reevaluation.  Call sooner if concerns arise. Encouraged patient to take medications daily and write down blood sugar numbers to bring to next visit.         Relevant Orders   HgB A1c   Hyperlipidemia associated with type 2 diabetes mellitus (HCC)    Chronic.  Controlled.  Continue with current medication regimen.  Labs ordered today.  Return to clinic in 6 months for reevaluation.  Call sooner if concerns arise.           Genitourinary   CKD (chronic kidney disease) stage 3, GFR 30-59 ml/min (HCC)    Chronic.  Controlled.  Continue with current medication regimen.  Not able to tolerated Benazepril so will hold off on ACE/ARB for kidney protection due to history of angioedema. Labs ordered today.  Return to clinic in 3 months for reevaluation.  Call sooner if concerns arise. Can refer to Nephrology if further decline.          Other   Morbid obesity ([place])    Recommend healthy lifestyle through diet and exercise.        Vitamin D deficiency    Labs ordered today. Will make recommendations based on lab results.        Relevant Orders   Vitamin D (25 hydroxy)     Follow up plan: Return in about 1 month (around 08/12/2021) for BP Check.

## 2021-07-12 ENCOUNTER — Encounter: Payer: Self-pay | Admitting: Nurse Practitioner

## 2021-07-12 ENCOUNTER — Ambulatory Visit (INDEPENDENT_AMBULATORY_CARE_PROVIDER_SITE_OTHER): Payer: Medicare Other | Admitting: Nurse Practitioner

## 2021-07-12 ENCOUNTER — Other Ambulatory Visit: Payer: Self-pay

## 2021-07-12 VITALS — BP 146/75 | HR 80 | Temp 97.8°F | Wt 201.1 lb

## 2021-07-12 DIAGNOSIS — E1142 Type 2 diabetes mellitus with diabetic polyneuropathy: Secondary | ICD-10-CM | POA: Diagnosis not present

## 2021-07-12 DIAGNOSIS — N1831 Chronic kidney disease, stage 3a: Secondary | ICD-10-CM

## 2021-07-12 DIAGNOSIS — E1159 Type 2 diabetes mellitus with other circulatory complications: Secondary | ICD-10-CM

## 2021-07-12 DIAGNOSIS — E1169 Type 2 diabetes mellitus with other specified complication: Secondary | ICD-10-CM

## 2021-07-12 DIAGNOSIS — E785 Hyperlipidemia, unspecified: Secondary | ICD-10-CM

## 2021-07-12 DIAGNOSIS — I152 Hypertension secondary to endocrine disorders: Secondary | ICD-10-CM

## 2021-07-12 DIAGNOSIS — E559 Vitamin D deficiency, unspecified: Secondary | ICD-10-CM

## 2021-07-12 MED ORDER — HYDRALAZINE HCL 10 MG PO TABS: 10.0000 mg | ORAL_TABLET | Freq: Two times a day (BID) | ORAL | 0 refills | Status: DC

## 2021-07-12 NOTE — Assessment & Plan Note (Signed)
Chronic.  Controlled.  Continue with current medication regimen.  Not able to tolerated Benazepril so will hold off on ACE/ARB for kidney protection due to history of angioedema. Labs ordered today.  Return to clinic in 3 months for reevaluation.  Call sooner if concerns arise. Can refer to Nephrology if further decline.

## 2021-07-12 NOTE — Assessment & Plan Note (Signed)
Chronic.  Elevated at visit today. Not checking blood pressures at home.  Will add hydralazine '10mg'$  BID. Discussed side effects and benefits of medication with patient. Follow up in 1 month for reevaluation.

## 2021-07-12 NOTE — Assessment & Plan Note (Signed)
Chronic.  Controlled.  Continue with current medication regimen.  Labs ordered today.  Return to clinic in 6 months for reevaluation.  Call sooner if concerns arise.  ? ?

## 2021-07-12 NOTE — Assessment & Plan Note (Signed)
Recommend healthy lifestyle through diet and exercise.

## 2021-07-12 NOTE — Assessment & Plan Note (Signed)
Labs ordered today.  Will make recommendations based on lab results. ?

## 2021-07-12 NOTE — Assessment & Plan Note (Signed)
Chronic.  Controlled.  Will make recommendations based on lab results.  Labs ordered today.  Return to clinic in 3 months for reevaluation.  Call sooner if concerns arise. Encouraged patient to take medications daily and write down blood sugar numbers to bring to next visit.

## 2021-07-13 LAB — COMPREHENSIVE METABOLIC PANEL
ALT: 12 IU/L (ref 0–32)
AST: 13 IU/L (ref 0–40)
Albumin/Globulin Ratio: 2 (ref 1.2–2.2)
Albumin: 4.2 g/dL (ref 3.7–4.7)
Alkaline Phosphatase: 72 IU/L (ref 44–121)
BUN/Creatinine Ratio: 10 — ABNORMAL LOW (ref 12–28)
BUN: 9 mg/dL (ref 8–27)
Bilirubin Total: 0.4 mg/dL (ref 0.0–1.2)
CO2: 25 mmol/L (ref 20–29)
Calcium: 9.1 mg/dL (ref 8.7–10.3)
Chloride: 103 mmol/L (ref 96–106)
Creatinine, Ser: 0.87 mg/dL (ref 0.57–1.00)
Globulin, Total: 2.1 g/dL (ref 1.5–4.5)
Glucose: 115 mg/dL — ABNORMAL HIGH (ref 65–99)
Potassium: 4.3 mmol/L (ref 3.5–5.2)
Sodium: 139 mmol/L (ref 134–144)
Total Protein: 6.3 g/dL (ref 6.0–8.5)
eGFR: 70 mL/min/{1.73_m2} (ref >59)

## 2021-07-13 LAB — HEMOGLOBIN A1C
Est. average glucose Bld gHb Est-mCnc: 194 mg/dL
Hgb A1c MFr Bld: 8.4 % — ABNORMAL HIGH (ref 4.8–5.6)

## 2021-07-13 LAB — VITAMIN D 25 HYDROXY (VIT D DEFICIENCY, FRACTURES): Vit D, 25-Hydroxy: 31.9 ng/mL (ref 30.0–100.0)

## 2021-07-13 MED ORDER — TRULICITY 3 MG/0.5ML ~~LOC~~ SOAJ: 3.0000 mg | SUBCUTANEOUS | 1 refills | Status: DC

## 2021-07-13 NOTE — Addendum Note (Signed)
Addended by: Jon Billings on: 07/13/2021 03:35 PM   Modules accepted: Orders

## 2021-07-13 NOTE — Progress Notes (Signed)
Please let patient know that her lab work looks good.  Her A1c improved from 9.6 to 8.4 which is great! This is still above goal, I would like to increase patient's Trulicity to '3mg'$ . If patient agrees, I can send this to the pharmacy for her. Please let me know if she has any questions.

## 2021-07-13 NOTE — Progress Notes (Signed)
Refill sent to the pharmacy 

## 2021-08-10 ENCOUNTER — Telehealth: Payer: Self-pay

## 2021-08-10 NOTE — Telephone Encounter (Signed)
Called and spoke with patient, she does not want the diabetic shoes at this time. [facility] notified.

## 2021-08-10 NOTE — Telephone Encounter (Signed)
Copied from [place] [id number]. Topic: General - Inquiry >> Aug 10, 2021 10:14 AM Scherrie Gerlach wrote: Reason for CRM: [facility] calling to make sure you have the fax for pt's diabetic shoes. Please send last OV so they can order her shoes

## 2021-08-12 ENCOUNTER — Ambulatory Visit: Payer: Medicare Other | Admitting: Nurse Practitioner

## 2021-08-16 ENCOUNTER — Encounter: Payer: Self-pay | Admitting: Nurse Practitioner

## 2021-08-16 ENCOUNTER — Ambulatory Visit (INDEPENDENT_AMBULATORY_CARE_PROVIDER_SITE_OTHER): Payer: Medicare Other | Admitting: Nurse Practitioner

## 2021-08-16 ENCOUNTER — Other Ambulatory Visit: Payer: Self-pay

## 2021-08-16 VITALS — BP 138/78 | HR 96 | Temp 98.2°F | Ht 60.0 in | Wt 199.4 lb

## 2021-08-16 DIAGNOSIS — I152 Hypertension secondary to endocrine disorders: Secondary | ICD-10-CM | POA: Diagnosis not present

## 2021-08-16 DIAGNOSIS — E1159 Type 2 diabetes mellitus with other circulatory complications: Secondary | ICD-10-CM

## 2021-08-16 MED ORDER — HYDRALAZINE HCL 10 MG PO TABS: 10.0000 mg | ORAL_TABLET | Freq: Two times a day (BID) | ORAL | 1 refills | Status: DC

## 2021-08-16 NOTE — Assessment & Plan Note (Signed)
Chronic.  Controlled.  Continue with current medication regimen.  Refill sent today.  Discussed with patient the importance of checking blood pressures at home.  Reviewed blood pressure medications with patient's during visit.  Return to clinic in 6 months for reevaluation.  Call sooner if concerns arise.

## 2021-08-16 NOTE — Progress Notes (Signed)
BP 138/78   Pulse 96   Temp 98.2 F (36.8 C)   Ht 5' (1.524 m)   Wt 199 lb 6 oz (90.4 kg)   SpO2 97%   BMI 38.94 kg/m    Subjective:    [name], female    DOB: 05-10-1946, 75 y.o.   MRN: [id number]  HPI: Tami Cummings is a 75 y.o. female  Chief Complaint  Patient presents with   Hypertension   HYPERTENSION Hypertension status: uncontrolled  Satisfied with current treatment? no Duration of hypertension: years BP monitoring frequency:  not checking BP range:  BP medication side effects:  no Medication compliance: excellent compliance Previous BP meds: hydralazine, amlodipine, and atenolol Aspirin: no Recurrent headaches: no Visual changes: no Palpitations: no Dyspnea: no Chest pain: no Lower extremity edema: no Dizzy/lightheaded: no  Relevant past medical, surgical, family and social history reviewed and updated as indicated. Interim medical history since our last visit reviewed. Allergies and medications reviewed and updated.  Review of Systems  Eyes:  Negative for visual disturbance.  Respiratory:  Negative for cough, chest tightness and shortness of breath.   Cardiovascular:  Negative for chest pain, palpitations and leg swelling.  Neurological:  Negative for dizziness and headaches.   Per HPI unless specifically indicated above     Objective:    BP 138/78   Pulse 96   Temp 98.2 F (36.8 C)   Ht 5' (1.524 m)   Wt 199 lb 6 oz (90.4 kg)   SpO2 97%   BMI 38.94 kg/m   Wt Readings from Last 3 Encounters:  08/16/21 199 lb 6 oz (90.4 kg)  07/12/21 201 lb 2 oz (91.2 kg)  06/10/21 200 lb (90.7 kg)    Physical Exam Vitals and nursing note reviewed.  Constitutional:      General: She is not in acute distress.    Appearance: Normal appearance. She is normal weight. She is not ill-appearing, toxic-appearing or diaphoretic.  HENT:     Head: Normocephalic.     Right Ear: External ear normal.     Left Ear: External ear  normal.     Nose: Nose normal.     Mouth/Throat:     Mouth: Mucous membranes are moist.     Pharynx: Oropharynx is clear.  Eyes:     General:        Right eye: No discharge.        Left eye: No discharge.     Extraocular Movements: Extraocular movements intact.     Conjunctiva/sclera: Conjunctivae normal.     Pupils: Pupils are equal, round, and reactive to light.  Cardiovascular:     Rate and Rhythm: Normal rate and regular rhythm.     Heart sounds: No murmur heard. Pulmonary:     Effort: Pulmonary effort is normal. No respiratory distress.     Breath sounds: Normal breath sounds. No wheezing or rales.  Musculoskeletal:     Cervical back: Normal range of motion and neck supple.  Skin:    General: Skin is warm and dry.     Capillary Refill: Capillary refill takes less than 2 seconds.  Neurological:     General: No focal deficit present.     Mental Status: She is alert and oriented to person, place, and time. Mental status is at baseline.  Psychiatric:        Mood and Affect: Mood normal.        Behavior: Behavior normal.  Thought Content: Thought content normal.        Judgment: Judgment normal.    Results for orders placed or performed in visit on 07/12/21  Comp Met (CMET)  Result Value Ref Range   Glucose 115 (H) 65 - 99 mg/dL   BUN 9 8 - 27 mg/dL   Creatinine, Ser 0.87 0.57 - 1.00 mg/dL   eGFR 70 >59 mL/min/1.73   BUN/Creatinine Ratio 10 (L) 12 - 28   Sodium 139 134 - 144 mmol/L   Potassium 4.3 3.5 - 5.2 mmol/L   Chloride 103 96 - 106 mmol/L   CO2 25 20 - 29 mmol/L   Calcium 9.1 8.7 - 10.3 mg/dL   Total Protein 6.3 6.0 - 8.5 g/dL   Albumin 4.2 3.7 - 4.7 g/dL   Globulin, Total 2.1 1.5 - 4.5 g/dL   Albumin/Globulin Ratio 2.0 1.2 - 2.2   Bilirubin Total 0.4 0.0 - 1.2 mg/dL   Alkaline Phosphatase 72 44 - 121 IU/L   AST 13 0 - 40 IU/L   ALT 12 0 - 32 IU/L  Vitamin D (25 hydroxy)  Result Value Ref Range   Vit D, 25-Hydroxy 31.9 30.0 - 100.0 ng/mL  HgB A1c   Result Value Ref Range   Hgb A1c MFr Bld 8.4 (H) 4.8 - 5.6 %   Est. average glucose Bld gHb Est-mCnc 194 mg/dL      Assessment & Plan:   Problem List Items Addressed This Visit       Cardiovascular and Mediastinum   Hypertension associated with diabetes ([place]) - Primary    Chronic.  Controlled.  Continue with current medication regimen.  Refill sent today.  Discussed with patient the importance of checking blood pressures at home.  Reviewed blood pressure medications with patient's during visit.  Return to clinic in 6 months for reevaluation.  Call sooner if concerns arise.        Relevant Medications   hydrALAZINE (APRESOLINE) 10 MG tablet     Follow up plan: Return in about 2 months (around 10/16/2021) for HTN, HLD, DM2 FU.

## 2021-08-19 ENCOUNTER — Ambulatory Visit (INDEPENDENT_AMBULATORY_CARE_PROVIDER_SITE_OTHER): Payer: Medicare Other

## 2021-08-19 ENCOUNTER — Telehealth: Payer: Medicare Other | Admitting: General Practice

## 2021-08-19 DIAGNOSIS — E1142 Type 2 diabetes mellitus with diabetic polyneuropathy: Secondary | ICD-10-CM | POA: Diagnosis not present

## 2021-08-19 DIAGNOSIS — I152 Hypertension secondary to endocrine disorders: Secondary | ICD-10-CM

## 2021-08-19 DIAGNOSIS — E1159 Type 2 diabetes mellitus with other circulatory complications: Secondary | ICD-10-CM | POA: Diagnosis not present

## 2021-08-19 DIAGNOSIS — E1169 Type 2 diabetes mellitus with other specified complication: Secondary | ICD-10-CM

## 2021-08-19 DIAGNOSIS — E785 Hyperlipidemia, unspecified: Secondary | ICD-10-CM | POA: Diagnosis not present

## 2021-08-19 NOTE — Chronic Care Management (AMB) (Signed)
Chronic Care Management   CCM RN Visit Note  08/19/2021 Name: Tami Cummings MRN: [id number] DOB: 28-Aug-1946  Subjective: Tami Cummings is a 75 y.o. year old female who is a primary care patient of Jon Billings, NP. The care management team was consulted for assistance with disease management and care coordination needs.    Engaged with patient by telephone for follow up visit in response to provider referral for case management and/or care coordination services.   Consent to Services:  The patient was given information about Chronic Care Management services, agreed to services, and gave verbal consent prior to initiation of services.  Please see initial visit note for detailed documentation.   Patient agreed to services and verbal consent obtained.   Assessment: Review of patient past medical history, allergies, medications, health status, including review of consultants reports, laboratory and other test data, was performed as part of comprehensive evaluation and provision of chronic care management services.   SDOH (Social Determinants of Health) assessments and interventions performed:  SDOH Interventions    Flowsheet Row Most Recent Value  SDOH Interventions   Physical Activity Interventions Other (Comments)  [No structured activity. Does take care of 5 year old granddaughter in the home. Stays active with her]  Social Connections Interventions Other (Comment)  [has a son and a daughter, good support system, son lives in [place] but she gets to talk to him on a regular basis. Saw 3 years ago.]        CCM Care Plan  Allergies  Allergen Reactions   Actos [Pioglitazone] Other (See Comments)    CHF   Amlodipine Swelling    Leg swelling    Benazepril Other (See Comments)    angioedema   Hydrochlorothiazide Other (See Comments)    angioedema   Metformin And Related Diarrhea    Outpatient Encounter Medications as of 08/19/2021  Medication Sig    amLODipine (NORVASC) 10 MG tablet Take 1 tablet (10 mg total) by mouth daily.   atenolol (TENORMIN) 100 MG tablet Take 1 tablet (100 mg total) by mouth daily.   BD PEN NEEDLE MICRO U/F 32G X 6 MM MISC USE 4 TO 5 TIMES DAILY WITH INSULIN USE   Blood Glucose Monitoring Suppl (ONETOUCH VERIO) w/Device KIT Use to check blood sugar 2-3 times daily and document, bring results to provider visits.   Dulaglutide (TRULICITY) 3 RC/7.8LF SOPN Inject 3 mg as directed once a week.   glucose blood test strip Use to check blood sugar 2-3 times daily and document, bring results to provider visits.   hydrALAZINE (APRESOLINE) 10 MG tablet Take 1 tablet (10 mg total) by mouth 2 (two) times daily.   insulin degludec (TRESIBA FLEXTOUCH) 100 UNIT/ML FlexTouch Pen Inject 40 Units into the skin daily.   insulin lispro (HUMALOG KWIKPEN) 100 UNIT/ML KwikPen Inject 0.25 mLs (25 Units total) into the skin 2 (two) times daily before a meal.   JANUVIA 50 MG tablet Take 1 tablet by mouth once daily   JARDIANCE 25 MG TABS tablet TAKE 1 TABLET BY MOUTH ONCE DAILY BEFORE BREAKFAST   Lancets (ONETOUCH ULTRASOFT) lancets Use to check blood sugar 2-3 times daily and document, bring results to provider visits.   lovastatin (MEVACOR) 40 MG tablet Take 1 tablet (40 mg total) by mouth daily with breakfast.   OneTouch Delica Lancets 81O MISC 1 applicator by Does not apply route in the morning and at bedtime.   spironolactone (ALDACTONE) 25 MG tablet Take 1 tablet by mouth  once daily   No facility-administered encounter medications on file as of 08/19/2021.    Patient Active Problem List   Diagnosis Date Noted   Osteopenia 01/01/2021   Vitamin D deficiency 10/18/2020   Vitamin B12 deficiency 10/18/2020   CKD (chronic kidney disease) stage 3, GFR 30-59 ml/min (HCC) 10/08/2020   Senile purpura ([place]) 04/10/2017   Morbid obesity ([place]) 04/10/2017   Advance care planning 04/10/2017   Noncompliance 10/30/2016   DM type 2 with diabetic  peripheral neuropathy ([place])    Hyperlipidemia associated with type 2 diabetes mellitus ([place])    Hypertension associated with diabetes ([place])     Conditions to be addressed/monitored:HTN, HLD, and DMII  Care Plan : RNCM: Diabetes Type 2 (Adult)  Updates made by Vanita Ingles, RN since 08/19/2021 12:00 AM     Problem: RNCM: Glycemic Management (Diabetes, Type 2)   Priority: High     Long-Range Goal: RNCM: Glycemic Management Optimized   Start Date: 02/09/2021  Expected End Date: 07/03/2022  This Visit's Progress: On track  Recent Progress: On track  Priority: High  Note:   Objective:  Lab Results  Component Value Date   HGBA1C 8.4 (H) 07/12/2021    Lab Results  Component Value Date   CREATININE 0.87 07/12/2021    No results found for: EGFR Current Barriers:  Knowledge Deficits related to basic Diabetes pathophysiology and self care/management Knowledge Deficits related to medications used for management of diabetes Literacy barriers Does not use cbg meter- on a consistent basis  Limited Social Support Non-compliance  Unable to self administer medications as prescribed Does not attend all scheduled provider appointments Does not adhere to prescribed medication regimen Lacks social connections Does not maintain contact with provider office Does not contact provider office for questions/concerns Case Manager Clinical Goal(s):  patient will demonstrate improved adherence to prescribed treatment plan for diabetes self care/management as evidenced by: daily monitoring and recording of CBG  adherence to ADA/ carb modified diet exercise 3/4 days/week adherence to prescribed medication regimen contacting provider for new or worsened symptoms or questions Interventions:  Collaboration with Jon Billings, NP regarding development and update of comprehensive plan of care as evidenced by provider attestation and co-signature Inter-disciplinary care team collaboration (see  longitudinal plan of care) Provided education to patient about basic DM disease process. 07-08-2021: Extensive review and reeducation on DM and the effects of uncontrolled DM on body systems. The patient states she is compliant but her hemoglobin A1C is elevated to 9.6. Education on goal of hemoglobin A1C of <7.0. 08-19-2021: The patient with trending down of A1C to 8.4 in July. Praised for getting down, but encouraged the patient to work on getting to goal. The patient has not been checking blood sugars due to not having batteries for her device.  Reviewed medications with patient and discussed importance of medication adherence. 07-08-2021: The patient states compliance with the current medications regimen. The patient is currently working with the pharmacist also. The patient was able to tell the RNCM her current medications regimen. 08-19-2021: Endorses compliance with medications.  Discussed plans with patient for ongoing care management follow up and provided patient with direct contact information for care management team Provided patient with written educational materials related to hypo and hyperglycemia and importance of correct treatment. Reviewed with the patient sx/sx to look for with hypo/hyperglycemia. The patient denies any issues and says she feels "great"  08-19-2021: Denies any lows at this time. Reviewed scheduled/upcoming provider appointments including: 07-12-2021 at 1020 am  Advised patient, providing education and rationale, to check cbg BID and record, calling pcp for findings outside established parameters.  The patient has not checked her blood sugars because she does not know how to work the meter. Trouble shooting attempted. The patient is going to see if her daughter can help her figure it out, or she will take it back to the pharmacy and get them to help her review how to use. Will touch base with pham D also to see if any recommendations. Expressed the importance of blood sugar checks  especially since there has been a change in her medications. The patient verbalized understanding. Goal for blood sugars: fasting <130 and post prandial <180. 07-08-2021: The patient is still unable to provide a list of readings for the RNCM. She states that she has not taken her blood sugar this am and does not take every day because of caring for her grandchildren. Expressed concern that she is not taking consistently. This has been an ongoing concern with the patient. She states yesterday it was 173 before supper.  Education on the rational for checking blood sugars and recording. Reeducation on fasting blood sugar <130 and post prandial <180. The patient needs frequent reminders to check blood sugars. Reviewed the goal of hemoglobin A1C of 7.0 or less. Discussed how her A1C has went in the higher range and the importance of medication compliance, exercise and dietary restrictions. The patient states she stopped eating sweets 2 weeks ago and she is doing well without eating sweets. Praised the patient for positive change and gave her alternatives to try when she has a sweet craving. Also educated the patient on taking blood sugars more frequently and recording, having record to take with her to the pcp on visit coming up on 07-12-2021. 08-19-2021: The patient saw the pcp on 08-16-2021. The patient states she cannot check her blood sugars because her meter needs batteries. She states she received her SS check today and she is going when her daughter takes her to get batteries for the meter. States that she will start taking again. Expressed concern and recommended the patient check her blood sugars regularly due to fluctuations in the past. The patient verbalized understanding.  Review of patient status, including review of consultants reports, relevant laboratory and other test results, and medications completed. Patient Goals: - barriers to adherence to treatment plan identified - blood glucose monitoring  encouraged - blood glucose readings reviewed - individualized medical nutrition therapy provided - mutual A1C goal set or reviewed - resources required to improve adherence to care identified - self-awareness of signs/symptoms of hypo or hyperglycemia encouraged - use of blood glucose monitoring log promoted Self-Care Activities - UNABLE to independently manage DM Attends all scheduled provider appointments Checks blood sugars as prescribed and utilize hyper and hypoglycemia protocol as needed Adheres to prescribed ADA/carb modified Follow Up Plan: Telephone follow up appointment with care management team member scheduled for: 10-21-2021 at 66 am     Care Plan : RNCM: Hypertension (Adult)  Updates made by Vanita Ingles, RN since 08/19/2021 12:00 AM     Problem: RNCM: Hypertension (Hypertension)   Priority: Medium     Long-Range Goal: RNCM: Hypertension Monitored   Start Date: 02/09/2021  Expected End Date: 05/04/2022  This Visit's Progress: On track  Recent Progress: On track  Priority: Medium  Note:   Objective:  Last practice recorded BP readings:  BP Readings from Last 3 Encounters:  08/16/21 138/78  07/12/21 (!) 146/75  05/19/21 (!) 152/73   Most recent eGFR/CrCl:  Lab Results  Component Value Date   EGFR 70 07/12/2021    No components found for: CRCL Current Barriers:  Knowledge Deficits related to basic understanding of hypertension pathophysiology and self care management Knowledge Deficits related to understanding of medications prescribed for management of hypertension Non-adherence to prescribed medication regimen Non-adherence to scheduled provider appointments Unable to independently manage HTN Does not attend all scheduled provider appointments Does not adhere to prescribed medication regimen Does not contact provider office for questions/concerns Case Manager Clinical Goal(s):  patient will verbalize understanding of plan for hypertension  management patient will attend all scheduled medical appointments: 10-17-2021 at 1000 am patient will demonstrate improved adherence to prescribed treatment plan for hypertension as evidenced by taking all medications as prescribed, monitoring and recording blood pressure as directed, adhering to low sodium/DASH diet patient will demonstrate improved health management independence as evidenced by checking blood pressure as directed and notifying PCP if SBP>160 or DBP > 90, taking all medications as prescribe, and adhering to a low sodium diet as discussed. patient will verbalize basic understanding of hypertension disease process and self health management plan as evidenced by compliance with medications, compliance with heart healthy/ADA diet and working with the CCM team to effectively manage health and well being  Interventions:  Collaboration with Jon Billings, NP regarding development and update of comprehensive plan of care as evidenced by provider attestation and co-signature Inter-disciplinary care team collaboration (see longitudinal plan of care) Evaluation of current treatment plan related to hypertension self management and patient's adherence to plan as established by provider. 08-19-2021: The patient saw the pcp this week and her blood pressure is WNL. The patient states that she is doing well and denies any new issues with HTN management. Will continue to monitor.  Provided education to patient re: stroke prevention, s/s of heart attack and stroke, DASH diet, complications of uncontrolled blood pressure Reviewed medications with patient and discussed importance of compliance. 08-19-2021: Is compliant with medications management  Discussed plans with patient for ongoing care management follow up and provided patient with direct contact information for care management team Advised patient, providing education and rationale, to monitor blood pressure daily and record, calling PCP for  findings outside established parameters.  Reviewed scheduled/upcoming provider appointments including: 10-17-2021 at 1000 am Self-Care Activities: - Self administers medications as prescribed Attends all scheduled provider appointments Calls provider office for new concerns, questions, or BP outside discussed parameters Checks BP and records as discussed Follows a low sodium diet/DASH diet Patient Goals: - check blood pressure weekly - choose a place to take my blood pressure (home, clinic or office, retail store) - write blood pressure results in a log or diary - agree on reward when goals are met - agree to work together to make changes - ask questions to understand - have a family meeting to talk about healthy habits - learn about high blood pressure  Follow Up Plan: Telephone follow up appointment with care management team member scheduled for: 10-21-2021 at 62 am    Care Plan : RNCM: HLD Management  Updates made by Vanita Ingles, RN since 08/19/2021 12:00 AM     Problem: RNCM: Management of HLD   Priority: Medium     Long-Range Goal: RNCM: HLD Management   Start Date: 02/09/2021  Expected End Date: 06/06/2022  This Visit's Progress: On track  Recent Progress: On track  Priority: Medium  Note:   Current Barriers:  Poorly  controlled hyperlipidemia, complicated by uncontrolled DM Current antihyperlipidemic regimen: Lovastatin 40 mg QD Most recent lipid panel:     Component Value Date/Time   CHOL 127 01/05/2021 1114   CHOL 137 12/04/2018 1106   TRIG 112 01/05/2021 1114   TRIG 262 (H) 12/04/2018 1106   HDL 37 (L) 01/05/2021 1114   CHOLHDL 4.0 04/10/2017 1347   VLDL 52 (H) 12/04/2018 1106   [place] 69 01/05/2021 1114   ASCVD risk enhancing conditions: age >37, DM, HTN, CKD, former smoker Unable to self administer medications as prescribed Does not attend all scheduled provider appointments Does not adhere to prescribed medication regimen Lacks social  connections Does not contact provider office for questions/concerns  RN Care Manager Clinical Goal(s):   patient will work with Consulting civil engineer, providers, and care team towards execution of optimized self-health management plan patient will verbalize understanding of plan for effective management of HLD  patient will work with [facility] and pcp  to address needs related to HLD  patient will attend all scheduled medical appointments: 10-17-2021  Interventions: Collaboration with Jon Billings, NP regarding development and update of comprehensive plan of care as evidenced by provider attestation and co-signature Inter-disciplinary care team collaboration (see longitudinal plan of care) Medication review performed; medication list updated in electronic medical record. 08-19-2021: The medications are current and the patient is compliant with medications at this time.   Inter-disciplinary care team collaboration (see longitudinal plan of care) Referred to pharmacy team for assistance with HLD medication management Evaluation of current treatment plan related to HLD  and patient's adherence to plan as established by provider. 08-19-2021: The patient is doing well with her diet at this time. The patient states she has been more mindful of what she is eating. The patient has cut out sweets from her diet. Denies any acute issues. Will continue to monitor for changes.  Advised patient to call the office for changes or questions. 08-19-2021: The patient states that she is doing well and denies any new concerns with management of her HLD. States she is staying active and following her medications regimen.  Provided education to patient re: heart healthy diet, medications compliance, working with the CCM team  Reviewed medications with patient and discussed compliance, the patient endorses taking medications as directed Reviewed scheduled/upcoming provider appointments including: 10-17-2021 at 1000 am Discussed  plans with patient for ongoing care management follow up and provided patient with direct contact information for care management team   Patient Goals/Self-Care Activities: patient will:   - call for medicine refill 2 or 3 days before it runs out - call if I am sick and can't take my medicine - keep a list of all the medicines I take; vitamins and herbals too - learn to read medicine labels - use a pillbox to sort medicine - use an alarm clock or phone to remind me to take my medicine - change to whole grain breads, cereal, pasta - drink 6 to 8 glasses of water each day - eat 3 to 5 servings of fruits and vegetables each day - eat 5 or 6 small meals each day - fill half the plate with nonstarchy vegetables - limit fast food meals to no more than 1 per week - manage portion size - prepare main meal at home 3 to 5 days each week - read food labels for fat, fiber, carbohydrates and portion size - be open to making changes - I can manage, know and watch for signs of a  heart attack - if I have chest pain, call for help - learn about small changes that will make a big difference - learn my personal risk factors   Follow Up Plan: Telephone follow up appointment with care management team member scheduled for: 11-4--2022 at 1030 am       Plan:Telephone follow up appointment with care management team member scheduled for:  10-21-2021 at 37 am  [place], MSN, [facility] Mobile: [phone]

## 2021-08-19 NOTE — Patient Instructions (Signed)
Visit Information  PATIENT GOALS:  Goals Addressed             This Visit's Progress    RNCM: Monitor and Manage My Blood Sugar-Diabetes Type 2       Timeframe:  Long-Range Goal Priority:  High Start Date: 02-09-2021                             Expected End Date:    06-16-2022                 Follow Up Date 11-4--2022   - check blood sugar at prescribed times - check blood sugar before and after exercise - check blood sugar if I feel it is too high or too low - enter blood sugar readings and medication or insulin into daily log - take the blood sugar log to all doctor visits   -take meter to the pharmacy and have pharmacy show how to use. The patient is having a hard time with the glucose meter and using. Education and support given. 07-08-2021: Is checking blood sugars now but not consistently. 08-19-2021: Needs to get new batteries for her meter.   Why is this important?   Checking your blood sugar at home helps to keep it from getting very high or very low.  Writing the results in a diary or log helps the doctor know how to care for you.  Your blood sugar log should have the time, date and the results.  Also, write down the amount of insulin or other medicine that you take.  Other information, like what you ate, exercise done and how you were feeling, will also be helpful.     Notes: Extensive education today on making sure she is checking her blood sugars at home. Has not been doing this. Is not familiar with the meter and how to use. Encouraged her to take to the pharmacy today or try to get her daughter to help  her with using the meter. 05-13-2021: The patient states that she is using her glucose meter but had not taken her blood sugars today. She was concerned about the storms. She states it was 140 something yesterday. She did not have a log book available. Extensive education and reenforcement of the need to consistently check blood sugars and record. Also review of Hemoglobin A1C  and the goal being 7.0 or less. The patients last A1C on 04-04-2021 was 9.6, up from 9.1. 07-08-2021: The patient remains noncompliant with checking blood sugars on a consistent basis. States her blood sugar before supper last night was 173. Review of blood sugars fasting is <130 and <180 post prandial.  The patient states she doesn't check her blood sugars every day. Education on the need to check blood sugars and record. Reminded patient of her appointment coming up with pcp on 07-12-2021 at 1020 and to have readings with her. The patient stopped eating sweets 2 weeks ago and states she is doing better with food choices.  08-19-2021: States she has not checked because  he meter needs new batteries. Will go to the store today when her daughter can take her to get things she needs. Encouraged the patient to take blood sugars regularly and record. Knows when her blood sugars are dropping.      RNCM: Track and Manage My Blood Pressure-Hypertension       Timeframe:  Long-Range Goal Priority:  Medium Start Date:  02-09-2021                     Expected End Date:       07-06-2022                Follow Up: 10-21-2021    - check blood pressure 3 times per week - write blood pressure results in a log or diary    Why is this important?   You won't feel high blood pressure, but it can still hurt your blood vessels.  High blood pressure can cause heart or kidney problems. It can also cause a stroke.  Making lifestyle changes like losing a little weight or eating less salt will help.  Checking your blood pressure at home and at different times of the day can help to control blood pressure.  If the doctor prescribes medicine remember to take it the way the doctor ordered.  Call the office if you cannot afford the medicine or if there are questions about it.     Notes: 07-08-2021: The patient is not consistently checking blood pressures at home. The patient states she can't with the grandchildren. Encouraged the  patient to check blood pressures weekly and record. 08-19-2021: The patient is not consistently checking her blood pressure. She stays busy keeping her 42 year old granddaughter. She has a son in [place] that is married and has 3 children and a daughter that she lives with with 3 daughters. She feels she is very active and does well for the most part in caring for herself.         The patient verbalized understanding of instructions, educational materials, and care plan provided today and declined offer to receive copy of patient instructions, educational materials, and care plan.   Telephone follow up appointment with care management team member scheduled for: 10-21-2021 at 83 am  Noreene Larsson RN, MSN, [facility] Mobile: [phone]

## 2021-09-28 ENCOUNTER — Other Ambulatory Visit: Payer: Self-pay | Admitting: Nurse Practitioner

## 2021-09-28 ENCOUNTER — Other Ambulatory Visit: Payer: Self-pay | Admitting: Family Medicine

## 2021-09-28 ENCOUNTER — Telehealth: Payer: Self-pay | Admitting: Nurse Practitioner

## 2021-09-28 DIAGNOSIS — E7800 Pure hypercholesterolemia, unspecified: Secondary | ICD-10-CM

## 2021-09-28 MED ORDER — LOVASTATIN 40 MG PO TABS: 40.0000 mg | ORAL_TABLET | Freq: Every day | ORAL | 1 refills | Status: DC

## 2021-09-28 MED ORDER — INSULIN LISPRO (1 UNIT DIAL) 100 UNIT/ML (KWIKPEN): 25.0000 [IU] | PEN_INJECTOR | Freq: Two times a day (BID) | SUBCUTANEOUS | 1 refills | Status: DC

## 2021-09-28 NOTE — Telephone Encounter (Signed)
Medication sent to the pharmacy for patient. ?

## 2021-09-28 NOTE — Telephone Encounter (Signed)
Medication Refill - Medication:  insulin lispro (HUMALOG KWIKPEN) 100 UNIT/ML KwikPen lovastatin (MEVACOR) 40 MG tablet  Has the patient contacted their pharmacy? Yes.   Pharmacy called on pts behalf  Preferred Pharmacy (with phone number or street name):  [place], [address] Phone:  [phone]  Fax:  [phone]     Has the patient been seen for an appointment in the last year OR does the patient have an upcoming appointment? No.  Agent: Please be advised that RX refills may take up to 3 business days. We ask that you follow-up with your pharmacy.

## 2021-09-29 NOTE — Telephone Encounter (Signed)
Requested Prescriptions  Refused Prescriptions Disp Refills  . lovastatin (MEVACOR) 40 MG tablet [Pharmacy Med Name: Lovastatin 40 MG Oral Tablet] 90 tablet 0    Sig: TAKE 1 TABLET (40 MG TOTAL) BY MOUTH DAILY WITH BREAKFAST.     Cardiovascular:  Antilipid - Statins Failed - 09/28/2021 10:32 AM      Failed - HDL in normal range and within 360 days    HDL  Date Value Ref Range Status  01/05/2021 37 (L) >39 mg/dL Final         Passed - Total Cholesterol in normal range and within 360 days    Cholesterol, Total  Date Value Ref Range Status  01/05/2021 127 100 - 199 mg/dL Final   Cholesterol Piccolo, Waived  Date Value Ref Range Status  12/04/2018 137 <200 mg/dL Final    Comment:                            Desirable                <200                         Borderline High      200- 239                         High                     >239          Passed - LDL in normal range and within 360 days    LDL Chol Calc (NIH)  Date Value Ref Range Status  01/05/2021 69 0 - 99 mg/dL Final         Passed - Triglycerides in normal range and within 360 days    Triglycerides  Date Value Ref Range Status  01/05/2021 112 0 - 149 mg/dL Final   Triglycerides Piccolo,Waived  Date Value Ref Range Status  12/04/2018 262 (H) <150 mg/dL Final    Comment:                            Normal                   <150                         Borderline High     150 - 199                         High                200 - 499                         Very High                >499          Passed - Patient is not pregnant      Passed - Valid encounter within last 12 months    Recent Outpatient Visits          1 month ago Hypertension associated with diabetes ([place])   [facility] Jon Billings, NP   2 months ago Hypertension associated with diabetes ([place])  [place], NP   5 months ago DM type 2 with diabetic peripheral neuropathy [facility])    [facility] Jon Billings, NP   7 months ago DM type 2 with diabetic peripheral neuropathy [facility])   [facility] Jon Billings, NP   8 months ago DM type 2 with diabetic peripheral neuropathy [facility])   [place], Jolene T, NP      Future Appointments            In 2 weeks Jon Billings, NP [place], [place]   In 8 months  [employer], [place]

## 2021-09-29 NOTE — Telephone Encounter (Signed)
Requested Prescriptions  Refused Prescriptions Disp Refills  . insulin lispro (HUMALOG) 100 UNIT/ML KwikPen [Pharmacy Med Name: Insulin Lispro (1 Unit Dial) 100 UNIT/ML Subcutaneous Solution Pen-injector] 15 mL 0    Sig: INJECT 25 UNITS SUBCUTANEOUSLY TWICE DAILY BEFORE A MEAL     Endocrinology:  Diabetes - Insulins Failed - 09/28/2021 10:33 AM      Failed - HBA1C is between 0 and 7.9 and within 180 days    Hemoglobin A1C  Date Value Ref Range Status  07/13/2016 7.9  Final   HB A1C (BAYER DCA - WAIVED)  Date Value Ref Range Status  01/05/2021 9.4 (H) <7.0 % Final    Comment:                                          Diabetic Adult            <7.0                                       Healthy Adult        4.3 - 5.7                                                           (DCCT/NGSP) American Diabetes Association's Summary of Glycemic Recommendations for Adults with Diabetes: Hemoglobin A1c <7.0%. More stringent glycemic goals (A1c <6.0%) may further reduce complications at the cost of increased risk of hypoglycemia.    Hgb A1c MFr Bld  Date Value Ref Range Status  07/12/2021 8.4 (H) 4.8 - 5.6 % Final    Comment:             Prediabetes: 5.7 - 6.4          Diabetes: >6.4          Glycemic control for adults with diabetes: <7.0          Passed - Valid encounter within last 6 months    Recent Outpatient Visits          1 month ago Hypertension associated with diabetes ([place])   [facility] Jon Billings, NP   2 months ago Hypertension associated with diabetes ([place])   [facility] Jon Billings, NP   5 months ago DM type 2 with diabetic peripheral neuropathy ([place])   [facility] Jon Billings, NP   7 months ago DM type 2 with diabetic peripheral neuropathy ([place])   [place] Jon Billings, NP   8 months ago DM type 2 with diabetic peripheral neuropathy [facility])   [place], Jolene T, NP       Future Appointments            In 2 weeks Jon Billings, NP [place], [place]   In 84 months  [employer], [place]

## 2021-10-03 ENCOUNTER — Telehealth: Payer: Self-pay

## 2021-10-03 NOTE — Chronic Care Management (AMB) (Signed)
Chronic Care Management Pharmacy Assistant   Name: Tamari Redwine  MRN: [id number] DOB: 29-Mar-1946   Reason for Encounter: Disease State Diabetes     Recent office visits:  08/16/21 Jon Billings NP - seen for hypertension - No medication changes noted - Follow up in 2 months  07/12/21 Jon Billings NP - seen for hypertension - Labs ordered - Start hydralazine 82m BID - Per provider note: "Not able to tolerated Benazepril so will hold off on ACE/ARB for kidney protection due to history of angioedema" - Follow up in 1 month   Recent consult visits:  06[place]07[place]21 [place] Follow up in 3 months   Hospital visits:   Admitted to the hospital on 05/19/21 due to foreign body in left foot . Discharge date was 05/19/21. [facility] -[facility]  amoxicillin-clavulanate 875-125 MG tablet, take 1 tablet by mouth 2 times daily  Hibiclens 4% external liquid, topically PRN    Medication Changes at Hospital Discharge: N/a  Medications Discontinued at Hospital Discharge: N/a  Medications that remain the same after Hospital Discharge:??  -All other medications will remain the same.    Medications: Outpatient Encounter Medications as of 10/03/2021  Medication Sig   amLODipine (NORVASC) 10 MG tablet Take 1 tablet (10 mg total) by mouth daily.   atenolol (TENORMIN) 100 MG tablet Take 1 tablet (100 mg total) by mouth daily.   BD PEN NEEDLE MICRO U/F 32G X 6 MM MISC USE 4 TO 5 TIMES DAILY WITH INSULIN USE   Blood Glucose Monitoring Suppl (ONETOUCH VERIO) w/Device KIT Use to check blood sugar 2-3 times daily and document, bring results to provider visits.   Dulaglutide (TRULICITY) 3 MEH/6.3JSSOPN Inject 3 mg as directed once a week.   glucose blood test strip Use to check blood sugar 2-3 times daily and document, bring results to provider visits.    hydrALAZINE (APRESOLINE) 10 MG tablet Take 1 tablet (10 mg total) by mouth 2 (two) times daily.   insulin degludec (TRESIBA FLEXTOUCH) 100 UNIT/ML FlexTouch Pen Inject 40 Units into the skin daily.   insulin lispro (HUMALOG KWIKPEN) 100 UNIT/ML KwikPen Inject 25 Units into the skin 2 (two) times daily before a meal.   JANUVIA 50 MG tablet Take 1 tablet by mouth once daily   JARDIANCE 25 MG TABS tablet TAKE 1 TABLET BY MOUTH ONCE DAILY BEFORE BREAKFAST   Lancets (ONETOUCH ULTRASOFT) lancets Use to check blood sugar 2-3 times daily and document, bring results to provider visits.   lovastatin (MEVACOR) 40 MG tablet Take 1 tablet (40 mg total) by mouth daily with breakfast.   OneTouch Delica Lancets 397WMISC 1 applicator by Does not apply route in the morning and at bedtime.   spironolactone (ALDACTONE) 25 MG tablet Take 1 tablet by mouth once daily   No facility-administered encounter medications on file as of 10/03/2021.   Recent Relevant Labs: Lab Results  Component Value Date/Time   HGBA1C 8.4 (H) 07/12/2021 10:43 AM   HGBA1C 9.6 (H) 04/04/2021 11:04 AM   HGBA1C 9.4 (H) 01/05/2021 11:12 AM   HGBA1C 7.8 (H) 12/04/2018 11:06 AM   HGBA1C 7.9 07/13/2016 12:00 AM   MICROALBUR 30 (H) 01/05/2021 11:12 AM   MICROALBUR 10 10/15/2020 02:59 PM    Kidney Function Lab Results  Component Value Date/Time   CREATININE 0.87 07/12/2021 10:43 AM  CREATININE 0.96 04/04/2021 11:04 AM   GFRNONAA 63 01/05/2021 11:14 AM   GFRAA 72 01/05/2021 11:14 AM    Current antihyperglycemic regimen:  Trulicity 1.5 mg q Monday Tresiba 40 units qhs Humalog 25 u bid  Jardiance 25 mg qd Januvia 50 mg qd What recent interventions/DTPs have been made to improve glycemic control:  N/a Have there been any recent hospitalizations or ED visits since last visit with CPP? Yes Patient hypoglycemic symptoms, including  Patient hyperglycemic symptoms, including  How often are you checking your blood sugar?  What are your  blood sugars ranging?   During the week, how often does your blood glucose drop below 70?  Are you checking your feet daily/regularly?   Adherence Review: Is the patient currently on a STATIN medication? Yes Is the patient currently on ACE/ARB medication? No Does the patient have >5 day gap between last estimated fill dates? No  Attempted to call patient a total of 3 times. Unable to reach her to complete an assessment call.   Care Gaps: None    Star Rating Drugs: Dulaglutide (TRULICITY) 3 HK/7.1UD SOPN 09/06/2021 28 DS insulin degludec (TRESIBA FLEXTOUCH) 100 UNIT/ML FlexTouch Pen last fill 06/07/2021 38 DS insulin lispro (HUMALOG KWIKPEN) 100 UNIT/ML KwikPen 09/28/21 JANUVIA 50 MG tablet 04/26/2021 JARDIANCE 25 MG TABS tablet 05/09/21 90 DS  lovastatin (MEVACOR) 40 MG tablet last fill 09/28/21 90 DS   Andee Poles, CMA

## 2021-10-11 ENCOUNTER — Other Ambulatory Visit: Payer: Self-pay | Admitting: Nurse Practitioner

## 2021-10-11 NOTE — Telephone Encounter (Signed)
Please advise 

## 2021-10-11 NOTE — Telephone Encounter (Signed)
Requested medication (s) are due for refill today - yes  Requested medication (s) are on the active medication list -yes  Future visit scheduled -yes  Last refill: 09/27/20 #90 2RF  Notes to clinic: Request RF: expired Rx  Requested Prescriptions  Pending Prescriptions Disp Refills   JANUVIA 50 MG tablet [Pharmacy Med Name: Januvia 50 MG Oral Tablet] 90 tablet 0    Sig: Take 1 tablet by mouth once daily     Endocrinology:  Diabetes - DPP-4 Inhibitors Failed - 10/11/2021 11:08 AM      Failed - HBA1C is between 0 and 7.9 and within 180 days    Hemoglobin A1C  Date Value Ref Range Status  07/13/2016 7.9  Final   HB A1C (BAYER DCA - WAIVED)  Date Value Ref Range Status  01/05/2021 9.4 (H) <7.0 % Final    Comment:                                          Diabetic Adult            <7.0                                       Healthy Adult        4.3 - 5.7                                                           (DCCT/NGSP) American Diabetes Association's Summary of Glycemic Recommendations for Adults with Diabetes: Hemoglobin A1c <7.0%. More stringent glycemic goals (A1c <6.0%) may further reduce complications at the cost of increased risk of hypoglycemia.    Hgb A1c MFr Bld  Date Value Ref Range Status  07/12/2021 8.4 (H) 4.8 - 5.6 % Final    Comment:             Prediabetes: 5.7 - 6.4          Diabetes: >6.4          Glycemic control for adults with diabetes: <7.0           Passed - Cr in normal range and within 360 days    Creatinine, Ser  Date Value Ref Range Status  07/12/2021 0.87 0.57 - 1.00 mg/dL Final          Passed - Valid encounter within last 6 months    Recent Outpatient Visits           1 month ago Hypertension associated with diabetes ([place])   [facility] Jon Billings, NP   3 months ago Hypertension associated with diabetes ([place])   [facility] Jon Billings, NP   6 months ago DM type 2 with diabetic peripheral  neuropathy ([place])   [facility] Jon Billings, NP   8 months ago DM type 2 with diabetic peripheral neuropathy [facility])   [facility] Jon Billings, NP   9 months ago DM type 2 with diabetic peripheral neuropathy [facility]   [place], Jolene T, NP       Future Appointments  In 6 days Jon Billings, NP [facility], [place]   In 8 months  [employer], [place]               Requested Prescriptions  Pending Prescriptions Disp Refills   JANUVIA 50 MG tablet [Pharmacy Med Name: Januvia 50 MG Oral Tablet] 90 tablet 0    Sig: Take 1 tablet by mouth once daily     Endocrinology:  Diabetes - DPP-4 Inhibitors Failed - 10/11/2021 11:08 AM      Failed - HBA1C is between 0 and 7.9 and within 180 days    Hemoglobin A1C  Date Value Ref Range Status  07/13/2016 7.9  Final   HB A1C (BAYER DCA - WAIVED)  Date Value Ref Range Status  01/05/2021 9.4 (H) <7.0 % Final    Comment:                                          Diabetic Adult            <7.0                                       Healthy Adult        4.3 - 5.7                                                           (DCCT/NGSP) American Diabetes Association's Summary of Glycemic Recommendations for Adults with Diabetes: Hemoglobin A1c <7.0%. More stringent glycemic goals (A1c <6.0%) may further reduce complications at the cost of increased risk of hypoglycemia.    Hgb A1c MFr Bld  Date Value Ref Range Status  07/12/2021 8.4 (H) 4.8 - 5.6 % Final    Comment:             Prediabetes: 5.7 - 6.4          Diabetes: >6.4          Glycemic control for adults with diabetes: <7.0           Passed - Cr in normal range and within 360 days    Creatinine, Ser  Date Value Ref Range Status  07/12/2021 0.87 0.57 - 1.00 mg/dL Final          Passed - Valid encounter within last 6 months    Recent Outpatient Visits           1 month ago  Hypertension associated with diabetes ([place])   [facility] Jon Billings, NP   3 months ago Hypertension associated with diabetes ([place])   [facility] Jon Billings, NP   6 months ago DM type 2 with diabetic peripheral neuropathy ([place])   [facility] Jon Billings, NP   8 months ago DM type 2 with diabetic peripheral neuropathy [facility])   [facility] Jon Billings, NP   9 months ago DM type 2 with diabetic peripheral neuropathy [facility])   [place], Jolene T, NP       Future Appointments             In 6  days Jon Billings, NP [facility], [place]   In 8 months  [facility], [place]

## 2021-10-17 ENCOUNTER — Encounter: Payer: Self-pay | Admitting: Nurse Practitioner

## 2021-10-17 ENCOUNTER — Other Ambulatory Visit: Payer: Self-pay

## 2021-10-17 ENCOUNTER — Ambulatory Visit (INDEPENDENT_AMBULATORY_CARE_PROVIDER_SITE_OTHER): Payer: Medicare Other | Admitting: Nurse Practitioner

## 2021-10-17 VITALS — BP 133/73 | HR 84 | Ht 60.0 in | Wt 196.0 lb

## 2021-10-17 DIAGNOSIS — E1159 Type 2 diabetes mellitus with other circulatory complications: Secondary | ICD-10-CM | POA: Diagnosis not present

## 2021-10-17 DIAGNOSIS — E1169 Type 2 diabetes mellitus with other specified complication: Secondary | ICD-10-CM

## 2021-10-17 DIAGNOSIS — E1142 Type 2 diabetes mellitus with diabetic polyneuropathy: Secondary | ICD-10-CM

## 2021-10-17 DIAGNOSIS — E538 Deficiency of other specified B group vitamins: Secondary | ICD-10-CM

## 2021-10-17 DIAGNOSIS — I152 Hypertension secondary to endocrine disorders: Secondary | ICD-10-CM

## 2021-10-17 DIAGNOSIS — E785 Hyperlipidemia, unspecified: Secondary | ICD-10-CM

## 2021-10-17 DIAGNOSIS — E559 Vitamin D deficiency, unspecified: Secondary | ICD-10-CM

## 2021-10-17 DIAGNOSIS — N1831 Chronic kidney disease, stage 3a: Secondary | ICD-10-CM | POA: Diagnosis not present

## 2021-10-17 NOTE — Assessment & Plan Note (Signed)
Chronic.  Uncontrolled.  Last A1c was 8.4.  Labs ordered today.  If still uncontrolled, will increase Trulicity to 4.5mg  weekly.  Labs ordered today.  Return to clinic in 3 months for reevaluation.  Call sooner if concerns arise.

## 2021-10-17 NOTE — Assessment & Plan Note (Signed)
Chronic.  Controlled.  Continue with current medication regimen of Hydralazine 10mg  BID, Spirnolactone 25mg , Amlodipine 10mg , and Atenolol 100mg .  Labs ordered today.  Return to clinic in 3 months for reevaluation.  Call sooner if concerns arise.

## 2021-10-17 NOTE — Assessment & Plan Note (Signed)
Labs ordered today.  Will make recommendations based on lab results. ?

## 2021-10-17 NOTE — Progress Notes (Signed)
BP 133/73   Pulse 84   Ht 5' (1.524 m)   Wt 196 lb (88.9 kg)   SpO2 98%   BMI 38.28 kg/m    Subjective:    [name], female    DOB: September 28, 1946, 76 y.o.   MRN: [id number]  HPI: Tami Cummings is a 75 y.o. female  Chief Complaint  Patient presents with   Follow-up   HYPERTENSION / HYPERLIPIDEMIA Satisfied with current treatment? no Duration of hypertension: years BP monitoring frequency: not checking BP range:  BP medication side effects: no Past BP meds: amlodipine and atenolol, hydralazine, and sprinolactone Duration of hyperlipidemia: years Cholesterol medication side effects: no Cholesterol supplements: none Past cholesterol medications: lovastatin (mevacor) Medication compliance: excellent compliance Aspirin: no Recent stressors: no Recurrent headaches: no Visual changes: no Palpitations: no Dyspnea: no Chest pain: no Lower extremity edema: no Dizzy/lightheaded: no  DIABETES Hypoglycemic episodes:no Polydipsia/polyuria: no Visual disturbance: no Chest pain: no Paresthesias: no Glucose Monitoring: no  Accucheck frequency:   Fasting glucose:  Post prandial:  Evening:  Before meals: Taking Insulin?: no  Long acting insulin:  Short acting insulin: Blood Pressure Monitoring: not checking Retinal Examination: Not up to Date Foot Exam: Up to Date Diabetic Education: Not Completed Pneumovax: Up to Date Influenza: Up to Date Aspirin: no  Relevant past medical, surgical, family and social history reviewed and updated as indicated. Interim medical history since our last visit reviewed. Allergies and medications reviewed and updated.  Review of Systems  Eyes:  Negative for visual disturbance.  Respiratory:  Negative for cough, chest tightness and shortness of breath.   Cardiovascular:  Negative for chest pain, palpitations and leg swelling.  Endocrine: Negative for polydipsia and polyuria.  Neurological:  Negative for  dizziness, numbness and headaches.   Per HPI unless specifically indicated above     Objective:    BP 133/73   Pulse 84   Ht 5' (1.524 m)   Wt 196 lb (88.9 kg)   SpO2 98%   BMI 38.28 kg/m   Wt Readings from Last 3 Encounters:  10/17/21 196 lb (88.9 kg)  08/16/21 199 lb 6 oz (90.4 kg)  07/12/21 201 lb 2 oz (91.2 kg)    Physical Exam Vitals and nursing note reviewed.  Constitutional:      General: She is not in acute distress.    Appearance: Normal appearance. She is obese. She is not ill-appearing, toxic-appearing or diaphoretic.  HENT:     Head: Normocephalic.     Right Ear: External ear normal.     Left Ear: External ear normal.     Nose: Nose normal.     Mouth/Throat:     Mouth: Mucous membranes are moist.     Pharynx: Oropharynx is clear.  Eyes:     General:        Right eye: No discharge.        Left eye: No discharge.     Extraocular Movements: Extraocular movements intact.     Conjunctiva/sclera: Conjunctivae normal.     Pupils: Pupils are equal, round, and reactive to light.  Cardiovascular:     Rate and Rhythm: Normal rate and regular rhythm.     Heart sounds: No murmur heard. Pulmonary:     Effort: Pulmonary effort is normal. No respiratory distress.     Breath sounds: Normal breath sounds. No wheezing or rales.  Musculoskeletal:     Cervical back: Normal range of motion and neck supple.  Skin:  General: Skin is warm and dry.     Capillary Refill: Capillary refill takes less than 2 seconds.  Neurological:     General: No focal deficit present.     Mental Status: She is alert and oriented to person, place, and time. Mental status is at baseline.  Psychiatric:        Mood and Affect: Mood normal.        Behavior: Behavior normal.        Thought Content: Thought content normal.        Judgment: Judgment normal.    Results for orders placed or performed in visit on 07/12/21  Comp Met (CMET)  Result Value Ref Range   Glucose 115 (H) 65 - 99 mg/dL    BUN 9 8 - 27 mg/dL   Creatinine, Ser 0.87 0.57 - 1.00 mg/dL   eGFR 70 >59 mL/min/1.73   BUN/Creatinine Ratio 10 (L) 12 - 28   Sodium 139 134 - 144 mmol/L   Potassium 4.3 3.5 - 5.2 mmol/L   Chloride 103 96 - 106 mmol/L   CO2 25 20 - 29 mmol/L   Calcium 9.1 8.7 - 10.3 mg/dL   Total Protein 6.3 6.0 - 8.5 g/dL   Albumin 4.2 3.7 - 4.7 g/dL   Globulin, Total 2.1 1.5 - 4.5 g/dL   Albumin/Globulin Ratio 2.0 1.2 - 2.2   Bilirubin Total 0.4 0.0 - 1.2 mg/dL   Alkaline Phosphatase 72 44 - 121 IU/L   AST 13 0 - 40 IU/L   ALT 12 0 - 32 IU/L  Vitamin D (25 hydroxy)  Result Value Ref Range   Vit D, 25-Hydroxy 31.9 30.0 - 100.0 ng/mL  HgB A1c  Result Value Ref Range   Hgb A1c MFr Bld 8.4 (H) 4.8 - 5.6 %   Est. average glucose Bld gHb Est-mCnc 194 mg/dL      Assessment & Plan:   Problem List Items Addressed This Visit       Cardiovascular and Mediastinum   Hypertension associated with diabetes ([place]) - Primary    Chronic.  Controlled.  Continue with current medication regimen of Hydralazine 42m BID, Spirnolactone 243m Amlodipine 1069mand Atenolol 100m14mLabs ordered today.  Return to clinic in 3 months for reevaluation.  Call sooner if concerns arise.        Relevant Orders   Comp Met (CMET)     Endocrine   DM type 2 with diabetic peripheral neuropathy (HCC)    Chronic.  Uncontrolled.  Last A1c was 8.4.  Labs ordered today.  If still uncontrolled, will increase Trulicity to 4.17m3.6UYkly.  Labs ordered today.  Return to clinic in 3 months for reevaluation.  Call sooner if concerns arise.        Relevant Orders   HgB A1c   Hyperlipidemia associated with type 2 diabetes mellitus (HCC)    Chronic.  Controlled.  Continue with current medication regimen of Lovastatin 40mg16mly.  Labs ordered today.  Return to clinic in 3 months for reevaluation.  Call sooner if concerns arise.        Relevant Orders   Lipid Profile     Genitourinary   CKD (chronic kidney disease) stage 3, GFR  30-59 ml/min (HCC)    Chronic.  Controlled.  Continue with current medication regimen.  Labs ordered today.  Return to clinic in 3 months for reevaluation.  Call sooner if concerns arise.        Relevant Orders   Comp  Met (CMET)     Other   Vitamin D deficiency    Labs ordered today. Will make recommendations based on lab results.       Relevant Orders   Vitamin D (25 hydroxy)   Vitamin B12 deficiency    Labs ordered today. Will make recommendations based on lab results.       Relevant Orders   CBC w/Diff   B12     Follow up plan: Return in about 3 months (around 01/17/2022) for HTN, HLD, DM2 FU.

## 2021-10-17 NOTE — Assessment & Plan Note (Signed)
Chronic.  Controlled.  Continue with current medication regimen of Lovastatin 40mg daily.  Labs ordered today.  Return to clinic in 3 months for reevaluation.  Call sooner if concerns arise.   

## 2021-10-17 NOTE — Assessment & Plan Note (Signed)
Chronic.  Controlled.  Continue with current medication regimen.  Labs ordered today.  Return to clinic in 3 months for reevaluation.  Call sooner if concerns arise.   

## 2021-10-18 LAB — COMPREHENSIVE METABOLIC PANEL
ALT: 11 IU/L (ref 0–32)
AST: 14 IU/L (ref 0–40)
Albumin/Globulin Ratio: 1.7 (ref 1.2–2.2)
Albumin: 4.2 g/dL (ref 3.7–4.7)
Alkaline Phosphatase: 88 IU/L (ref 44–121)
BUN/Creatinine Ratio: 12 (ref 12–28)
BUN: 11 mg/dL (ref 8–27)
Bilirubin Total: 0.4 mg/dL (ref 0.0–1.2)
CO2: 24 mmol/L (ref 20–29)
Calcium: 9 mg/dL (ref 8.7–10.3)
Chloride: 102 mmol/L (ref 96–106)
Creatinine, Ser: 0.92 mg/dL (ref 0.57–1.00)
Globulin, Total: 2.5 g/dL (ref 1.5–4.5)
Glucose: 121 mg/dL — ABNORMAL HIGH (ref 70–99)
Potassium: 4.3 mmol/L (ref 3.5–5.2)
Sodium: 139 mmol/L (ref 134–144)
Total Protein: 6.7 g/dL (ref 6.0–8.5)
eGFR: 65 mL/min/{1.73_m2} (ref >59)

## 2021-10-18 LAB — CBC WITH DIFFERENTIAL/PLATELET
Basophils Absolute: 0.1 10*3/uL (ref 0.0–0.2)
Basos: 1 %
EOS (ABSOLUTE): 0.1 10*3/uL (ref 0.0–0.4)
Eos: 1 %
Hematocrit: 44.7 % (ref 34.0–46.6)
Hemoglobin: 14.4 g/dL (ref 11.1–15.9)
Immature Grans (Abs): 0 10*3/uL (ref 0.0–0.1)
Immature Granulocytes: 0 %
Lymphocytes Absolute: 1.5 10*3/uL (ref 0.7–3.1)
Lymphs: 16 %
MCH: 24.9 pg — ABNORMAL LOW (ref 26.6–33.0)
MCHC: 32.2 g/dL (ref 31.5–35.7)
MCV: 77 fL — ABNORMAL LOW (ref 79–97)
Monocytes Absolute: 0.6 10*3/uL (ref 0.1–0.9)
Monocytes: 7 %
Neutrophils Absolute: 6.7 10*3/uL (ref 1.4–7.0)
Neutrophils: 75 %
Platelets: 223 10*3/uL (ref 150–450)
RBC: 5.78 x10E6/uL — ABNORMAL HIGH (ref 3.77–5.28)
RDW: 14.5 % (ref 11.7–15.4)
WBC: 8.9 10*3/uL (ref 3.4–10.8)

## 2021-10-18 LAB — HEMOGLOBIN A1C
Est. average glucose Bld gHb Est-mCnc: 192 mg/dL
Hgb A1c MFr Bld: 8.3 % — ABNORMAL HIGH (ref 4.8–5.6)

## 2021-10-18 LAB — LIPID PANEL
Chol/HDL Ratio: 4 ratio (ref 0.0–4.4)
Cholesterol, Total: 145 mg/dL (ref 100–199)
HDL: 36 mg/dL — ABNORMAL LOW (ref >39)
LDL Chol Calc (NIH): 90 mg/dL (ref 0–99)
Triglycerides: 100 mg/dL (ref 0–149)
VLDL Cholesterol Cal: 19 mg/dL (ref 5–40)

## 2021-10-18 LAB — VITAMIN D 25 HYDROXY (VIT D DEFICIENCY, FRACTURES): Vit D, 25-Hydroxy: 24.8 ng/mL — ABNORMAL LOW (ref 30.0–100.0)

## 2021-10-18 LAB — VITAMIN B12: Vitamin B-12: 243 pg/mL (ref 232–1245)

## 2021-10-18 MED ORDER — TRULICITY 4.5 MG/0.5ML ~~LOC~~ SOAJ: 4.5000 mg | SUBCUTANEOUS | 1 refills | Status: DC

## 2021-10-18 NOTE — Addendum Note (Signed)
Addended by: Jon Billings on: 10/18/2021 07:16 PM   Modules accepted: Orders

## 2021-10-18 NOTE — Progress Notes (Signed)
Please let patient know that her lab work shows that her A1c remains relatively the same as prior at 8.3..  We should increase her Trulicity to 4.5mg  weekly.  If she is okay with this, I will send it in to the pharmacy. Her vitamin D is low.  I recommend she start 1000IU of vitamin D once daily.  This is over the counter.  Please let me know if you have any questions.

## 2021-10-18 NOTE — Progress Notes (Signed)
Trulicity 4.5mg  sent to the pharmacy for patient.

## 2021-10-21 ENCOUNTER — Ambulatory Visit (INDEPENDENT_AMBULATORY_CARE_PROVIDER_SITE_OTHER): Payer: Medicare Other

## 2021-10-21 ENCOUNTER — Telehealth: Payer: Medicare Other | Admitting: General Practice

## 2021-10-21 DIAGNOSIS — E1159 Type 2 diabetes mellitus with other circulatory complications: Secondary | ICD-10-CM

## 2021-10-21 DIAGNOSIS — E1169 Type 2 diabetes mellitus with other specified complication: Secondary | ICD-10-CM

## 2021-10-21 DIAGNOSIS — E785 Hyperlipidemia, unspecified: Secondary | ICD-10-CM

## 2021-10-21 DIAGNOSIS — E1142 Type 2 diabetes mellitus with diabetic polyneuropathy: Secondary | ICD-10-CM

## 2021-10-21 DIAGNOSIS — I152 Hypertension secondary to endocrine disorders: Secondary | ICD-10-CM

## 2021-10-21 NOTE — Chronic Care Management (AMB) (Signed)
Chronic Care Management   CCM RN Visit Note  10/21/2021 Name: Makhia Vosler MRN: [id number] DOB: [birth year]  Subjective: Loreli Slot Howeth is a 75 y.o. year old female who is a primary care patient of Jon Billings, NP. The care management team was consulted for assistance with disease management and care coordination needs.    Engaged with patient by telephone for follow up visit in response to provider referral for case management and/or care coordination services.   Consent to Services:  The patient was given information about Chronic Care Management services, agreed to services, and gave verbal consent prior to initiation of services.  Please see initial visit note for detailed documentation.   Patient agreed to services and verbal consent obtained.   Assessment: Review of patient past medical history, allergies, medications, health status, including review of consultants reports, laboratory and other test data, was performed as part of comprehensive evaluation and provision of chronic care management services.   SDOH (Social Determinants of Health) assessments and interventions performed:    CCM Care Plan  Allergies  Allergen Reactions   Actos [Pioglitazone] Other (See Comments)    CHF   Amlodipine Swelling    Leg swelling    Benazepril Other (See Comments)    angioedema   Hydrochlorothiazide Other (See Comments)    angioedema   Metformin And Related Diarrhea    Outpatient Encounter Medications as of 10/21/2021  Medication Sig   amLODipine (NORVASC) 10 MG tablet Take 1 tablet (10 mg total) by mouth daily.   atenolol (TENORMIN) 100 MG tablet Take 1 tablet (100 mg total) by mouth daily.   BD PEN NEEDLE MICRO U/F 32G X 6 MM MISC USE 4 TO 5 TIMES DAILY WITH INSULIN USE   Blood Glucose Monitoring Suppl (ONETOUCH VERIO) w/Device KIT Use to check blood sugar 2-3 times daily and document, bring results to provider visits.   Dulaglutide (TRULICITY) 4.5 IO/0.3TD SOPN  Inject 4.5 mg as directed once a week.   glucose blood test strip Use to check blood sugar 2-3 times daily and document, bring results to provider visits.   hydrALAZINE (APRESOLINE) 10 MG tablet Take 1 tablet (10 mg total) by mouth 2 (two) times daily.   insulin degludec (TRESIBA FLEXTOUCH) 100 UNIT/ML FlexTouch Pen Inject 40 Units into the skin daily.   insulin lispro (HUMALOG KWIKPEN) 100 UNIT/ML KwikPen Inject 25 Units into the skin 2 (two) times daily before a meal.   JANUVIA 50 MG tablet Take 1 tablet by mouth once daily   JARDIANCE 25 MG TABS tablet TAKE 1 TABLET BY MOUTH ONCE DAILY BEFORE BREAKFAST   Lancets (ONETOUCH ULTRASOFT) lancets Use to check blood sugar 2-3 times daily and document, bring results to provider visits.   lovastatin (MEVACOR) 40 MG tablet Take 1 tablet (40 mg total) by mouth daily with breakfast.   OneTouch Delica Lancets 97C MISC 1 applicator by Does not apply route in the morning and at bedtime.   spironolactone (ALDACTONE) 25 MG tablet Take 1 tablet by mouth once daily   No facility-administered encounter medications on file as of 10/21/2021.    Patient Active Problem List   Diagnosis Date Noted   Osteopenia 01/01/2021   Vitamin D deficiency 10/18/2020   Vitamin B12 deficiency 10/18/2020   CKD (chronic kidney disease) stage 3, GFR 30-59 ml/min ([place]) 10/08/2020   Senile purpura ([place]) 04/10/2017   Morbid obesity ([place]) 04/10/2017   Advance care planning 04/10/2017   Noncompliance 10/30/2016   DM type 2 with  diabetic peripheral neuropathy ([place])    Hyperlipidemia associated with type 2 diabetes mellitus ([place])    Hypertension associated with diabetes ([place])     Conditions to be addressed/monitored:HTN, HLD, and DMII  Care Plan : RNCM: Diabetes Type 2 (Adult)  Updates made by Vanita Ingles, RN since 10/21/2021 12:00 AM  Completed 10/21/2021   Problem: RNCM: Glycemic Management (Diabetes, Type 2) Resolved 10/21/2021  Priority: High     Long-Range Goal: RNCM:  Glycemic Management Optimized Completed 10/21/2021  Start Date: 02/09/2021  Expected End Date: 07/03/2022  Recent Progress: On track  Priority: High  Note:   Objective: resolving, duplicate goals Lab Results  Component Value Date   HGBA1C 8.4 (H) 07/12/2021    Lab Results  Component Value Date   CREATININE 0.87 07/12/2021    No results found for: EGFR Current Barriers:  Knowledge Deficits related to basic Diabetes pathophysiology and self care/management Knowledge Deficits related to medications used for management of diabetes Literacy barriers Does not use cbg meter- on a consistent basis  Limited Social Support Non-compliance  Unable to self administer medications as prescribed Does not attend all scheduled provider appointments Does not adhere to prescribed medication regimen Lacks social connections Does not maintain contact with provider office Does not contact provider office for questions/concerns Case Manager Clinical Goal(s):  patient will demonstrate improved adherence to prescribed treatment plan for diabetes self care/management as evidenced by: daily monitoring and recording of CBG  adherence to ADA/ carb modified diet exercise 3/4 days/week adherence to prescribed medication regimen contacting provider for new or worsened symptoms or questions Interventions:  Collaboration with Jon Billings, NP regarding development and update of comprehensive plan of care as evidenced by provider attestation and co-signature Inter-disciplinary care team collaboration (see longitudinal plan of care) Provided education to patient about basic DM disease process. 07-08-2021: Extensive review and reeducation on DM and the effects of uncontrolled DM on body systems. The patient states she is compliant but her hemoglobin A1C is elevated to 9.6. Education on goal of hemoglobin A1C of <7.0. 08-19-2021: The patient with trending down of A1C to 8.4 in July. Praised for getting down, but  encouraged the patient to work on getting to goal. The patient has not been checking blood sugars due to not having batteries for her device.  Reviewed medications with patient and discussed importance of medication adherence. 07-08-2021: The patient states compliance with the current medications regimen. The patient is currently working with the pharmacist also. The patient was able to tell the RNCM her current medications regimen. 08-19-2021: Endorses compliance with medications.  Discussed plans with patient for ongoing care management follow up and provided patient with direct contact information for care management team Provided patient with written educational materials related to hypo and hyperglycemia and importance of correct treatment. Reviewed with the patient sx/sx to look for with hypo/hyperglycemia. The patient denies any issues and says she feels "great"  08-19-2021: Denies any lows at this time. Reviewed scheduled/upcoming provider appointments including: 07-12-2021 at 1020 am Advised patient, providing education and rationale, to check cbg BID and record, calling pcp for findings outside established parameters.  The patient has not checked her blood sugars because she does not know how to work the meter. Trouble shooting attempted. The patient is going to see if her daughter can help her figure it out, or she will take it back to the pharmacy and get them to help her review how to use. Will touch base with pham D also to  see if any recommendations. Expressed the importance of blood sugar checks especially since there has been a change in her medications. The patient verbalized understanding. Goal for blood sugars: fasting <130 and post prandial <180. 07-08-2021: The patient is still unable to provide a list of readings for the RNCM. She states that she has not taken her blood sugar this am and does not take every day because of caring for her grandchildren. Expressed concern that she is not taking  consistently. This has been an ongoing concern with the patient. She states yesterday it was 173 before supper.  Education on the rational for checking blood sugars and recording. Reeducation on fasting blood sugar <130 and post prandial <180. The patient needs frequent reminders to check blood sugars. Reviewed the goal of hemoglobin A1C of 7.0 or less. Discussed how her A1C has went in the higher range and the importance of medication compliance, exercise and dietary restrictions. The patient states she stopped eating sweets 2 weeks ago and she is doing well without eating sweets. Praised the patient for positive change and gave her alternatives to try when she has a sweet craving. Also educated the patient on taking blood sugars more frequently and recording, having record to take with her to the pcp on visit coming up on 07-12-2021. 08-19-2021: The patient saw the pcp on 08-16-2021. The patient states she cannot check her blood sugars because her meter needs batteries. She states she received her SS check today and she is going when her daughter takes her to get batteries for the meter. States that she will start taking again. Expressed concern and recommended the patient check her blood sugars regularly due to fluctuations in the past. The patient verbalized understanding.  Review of patient status, including review of consultants reports, relevant laboratory and other test results, and medications completed. Patient Goals: - barriers to adherence to treatment plan identified - blood glucose monitoring encouraged - blood glucose readings reviewed - individualized medical nutrition therapy provided - mutual A1C goal set or reviewed - resources required to improve adherence to care identified - self-awareness of signs/symptoms of hypo or hyperglycemia encouraged - use of blood glucose monitoring log promoted Self-Care Activities - UNABLE to independently manage DM Attends all scheduled provider  appointments Checks blood sugars as prescribed and utilize hyper and hypoglycemia protocol as needed Adheres to prescribed ADA/carb modified Follow Up Plan: Telephone follow up appointment with care management team member scheduled for: 10-21-2021 at 52 am     Care Plan : RNCM: Hypertension (Adult)  Updates made by Vanita Ingles, RN since 10/21/2021 12:00 AM  Completed 10/21/2021   Problem: RNCM: Hypertension (Hypertension) Resolved 10/21/2021  Priority: Medium     Long-Range Goal: RNCM: Hypertension Monitored Completed 10/21/2021  Start Date: 02/09/2021  Expected End Date: 05/04/2022  Recent Progress: On track  Priority: Medium  Note:   Objective: resolving, duplicate goal Last practice recorded BP readings:  BP Readings from Last 3 Encounters:  08/16/21 138/78  07/12/21 (!) 146/75  05/19/21 (!) 152/73   Most recent eGFR/CrCl:  Lab Results  Component Value Date   EGFR 70 07/12/2021    No components found for: CRCL Current Barriers:  Knowledge Deficits related to basic understanding of hypertension pathophysiology and self care management Knowledge Deficits related to understanding of medications prescribed for management of hypertension Non-adherence to prescribed medication regimen Non-adherence to scheduled provider appointments Unable to independently manage HTN Does not attend all scheduled provider appointments Does not adhere to prescribed medication  regimen Does not contact provider office for questions/concerns Case Manager Clinical Goal(s):  patient will verbalize understanding of plan for hypertension management patient will attend all scheduled medical appointments: 10-17-2021 at 1000 am patient will demonstrate improved adherence to prescribed treatment plan for hypertension as evidenced by taking all medications as prescribed, monitoring and recording blood pressure as directed, adhering to low sodium/DASH diet patient will demonstrate improved health  management independence as evidenced by checking blood pressure as directed and notifying PCP if SBP>160 or DBP > 90, taking all medications as prescribe, and adhering to a low sodium diet as discussed. patient will verbalize basic understanding of hypertension disease process and self health management plan as evidenced by compliance with medications, compliance with heart healthy/ADA diet and working with the CCM team to effectively manage health and well being  Interventions:  Collaboration with Jon Billings, NP regarding development and update of comprehensive plan of care as evidenced by provider attestation and co-signature Inter-disciplinary care team collaboration (see longitudinal plan of care) Evaluation of current treatment plan related to hypertension self management and patient's adherence to plan as established by provider. 08-19-2021: The patient saw the pcp this week and her blood pressure is WNL. The patient states that she is doing well and denies any new issues with HTN management. Will continue to monitor.  Provided education to patient re: stroke prevention, s/s of heart attack and stroke, DASH diet, complications of uncontrolled blood pressure Reviewed medications with patient and discussed importance of compliance. 08-19-2021: Is compliant with medications management  Discussed plans with patient for ongoing care management follow up and provided patient with direct contact information for care management team Advised patient, providing education and rationale, to monitor blood pressure daily and record, calling PCP for findings outside established parameters.  Reviewed scheduled/upcoming provider appointments including: 10-17-2021 at 1000 am Self-Care Activities: - Self administers medications as prescribed Attends all scheduled provider appointments Calls provider office for new concerns, questions, or BP outside discussed parameters Checks BP and records as  discussed Follows a low sodium diet/DASH diet Patient Goals: - check blood pressure weekly - choose a place to take my blood pressure (home, clinic or office, retail store) - write blood pressure results in a log or diary - agree on reward when goals are met - agree to work together to make changes - ask questions to understand - have a family meeting to talk about healthy habits - learn about high blood pressure  Follow Up Plan: Telephone follow up appointment with care management team member scheduled for: 10-21-2021 at 30 am    Care Plan : RNCM: HLD Management  Updates made by Vanita Ingles, RN since 10/21/2021 12:00 AM  Completed 10/21/2021   Problem: RNCM: Management of HLD Resolved 10/21/2021  Priority: Medium     Long-Range Goal: RNCM: HLD Management Completed 10/21/2021  Start Date: 02/09/2021  Expected End Date: 06/06/2022  Recent Progress: On track  Priority: Medium  Note:   Current Barriers: resolving, duplicate goal Poorly controlled hyperlipidemia, complicated by uncontrolled DM Current antihyperlipidemic regimen: Lovastatin 40 mg QD Most recent lipid panel:     Component Value Date/Time   CHOL 127 01/05/2021 1114   CHOL 137 12/04/2018 1106   TRIG 112 01/05/2021 1114   TRIG 262 (H) 12/04/2018 1106   HDL 37 (L) 01/05/2021 1114   CHOLHDL 4.0 04/10/2017 1347   VLDL 52 (H) 12/04/2018 1106   [place] 69 01/05/2021 1114   ASCVD risk enhancing conditions: age >8, DM, HTN,  CKD, former smoker Unable to self administer medications as prescribed Does not attend all scheduled provider appointments Does not adhere to prescribed medication regimen Lacks social connections Does not contact provider office for [place]):   patient will work with Consulting civil engineer, providers, and care team towards execution of optimized self-health management plan patient will verbalize understanding of plan for effective management of HLD   patient will [facility] with [facility] and pcp  to address needs related to HLD  patient will attend all scheduled medical appointments: 10-17-2021  Interventions: Collaboration with Jon Billings, NP regarding development and update of comprehensive plan of care as evidenced by provider attestation and co-signature Inter-disciplinary care team collaboration (see longitudinal plan of care) Medication review performed; medication list updated in electronic medical record. 08-19-2021: The medications are current and the patient is compliant with medications at this time.   Inter-disciplinary care team collaboration (see longitudinal plan of care) Referred to pharmacy team for assistance with HLD medication management Evaluation of current treatment plan related to HLD  and patient's adherence to plan as established by provider. 08-19-2021: The patient is doing well with her diet at this time. The patient states she has been more mindful of what she is eating. The patient has cut out sweets from her diet. Denies any acute issues. Will continue to monitor for changes.  Advised patient to call the office for changes or questions. 08-19-2021: The patient states that she is doing well and denies any new concerns with management of her HLD. States she is staying active and following her medications regimen.  Provided education to patient re: heart healthy diet, medications compliance, working with the CCM team  Reviewed medications with patient and discussed compliance, the patient endorses taking medications as directed Reviewed scheduled/upcoming provider appointments including: 10-17-2021 at 1000 am Discussed plans with patient for ongoing care management follow up and provided patient with direct contact information for care management team   Patient Goals/Self-Care Activities: patient will:   - call for medicine refill 2 or 3 days before it runs out - call if I am sick and can't take my medicine - keep a list  of all the medicines I take; vitamins and herbals too - learn to read medicine labels - use a pillbox to sort medicine - use an alarm clock or phone to remind me to take my medicine - change to whole grain breads, cereal, pasta - drink 6 to 8 glasses of water each day - eat 3 to 5 servings of fruits and vegetables each day - eat 5 or 6 small meals each day - fill half the plate with nonstarchy vegetables - limit fast food meals to no more than 1 per week - manage portion size - prepare main meal at home 3 to 5 days each week - read food labels for fat, fiber, carbohydrates and portion size - be open to making changes - I can manage, know and watch for signs of a heart attack - if I have chest pain, call for help - learn about small changes that will make a big difference - learn my personal risk factors   Follow Up Plan: Telephone follow up appointment with care management team member scheduled for: 11-4--2022 at 21 am      Care Plan : RNCM: General Plan of Care (Adult) for Chronic Disease Management and Care Coordination Needs  Updates made by Vanita Ingles, RN since 10/21/2021 12:00 AM  Problem: RNCM: Development of plan of care for Chronic Disease Management (DM, HTN, HLD)   Priority: High     Long-Range Goal: RNCM: Effective Management  of plan of care for Chronic Disease Management (DM, HTN, HLD)   Start Date: 10/21/2021  Expected End Date: 10/21/2022  Priority: High  Note:   Current Barriers:  Knowledge Deficits related to plan of care for management of HTN, HLD, and DMII  Chronic Disease Management support and education needs related to HTN, HLD, and DMII  RNCM Clinical Goal(s):  Patient will verbalize understanding of plan for management of HTN, HLD, and DMII as evidenced by keeping appointments, following the plan of care and following recommendations by the pcp and CCM team take all medications exactly as prescribed and will call provider for medication related  questions as evidenced by directed and calling for refills before running out of medications    attend all scheduled medical appointments: 01-18-2021 at 2 pm as evidenced by keeping appointments        demonstrate improved and ongoing adherence to prescribed treatment plan for HTN, HLD, and DMII as evidenced by taking blood glucose levels on a consistent basis and compliance with dietary restrictions  demonstrate a decrease in HTN, HLD, and DMII exacerbations  as evidenced by calling the office for changes in condition, questions, or concerns, seeking emergent help for acute exacerbations, and working with the CCM team to optimize health and well being demonstrate ongoing self health care management ability for effective management of chronic conditions as evidenced by working with the CCM team  through collaboration with Consulting civil engineer, provider, and care team.   Interventions: 1:1 collaboration with primary care provider regarding development and update of comprehensive plan of care as evidenced by provider attestation and co-signature Inter-disciplinary care team collaboration (see longitudinal plan of care) Evaluation of current treatment plan related to  self management and patient's adherence to plan as established by provider   SDOH Barriers (Status: Goal on Track (progressing): YES.) Long Term Goal  Patient interviewed and SDOH assessment performed        Patient interviewed and appropriate assessments performed Provided patient with information about resources available in [facility] and care guides to assist with changes in SDOH, or new needs  Discussed plans with patient for ongoing care management follow up and provided patient with direct contact information for care management team Advised patient to call the office for changes in [place], new questions or concerns    Diabetes:  (Status: Goal on track: NO.) Long Term Goal   Lab Results  Component Value Date   HGBA1C 8.3 (H)  10/17/2021  Assessed patient's understanding of A1c goal: <7% Provided education to patient about basic DM disease process; Reviewed medications with patient and discussed importance of medication adherence. 10-21-2021: The patient confirms she is taking Trulicity 4.5 mg as prescribed, she has also started the Vitam D at 1000 IU;        Reviewed prescribed diet with patient heart healthy/ADA; Counseled on importance of regular laboratory monitoring as prescribed;        Discussed plans with patient for ongoing care management follow up and provided patient with direct contact information for care management team;      Provided patient with written educational materials related to hypo and hyperglycemia and importance of correct treatment. 10-21-2021: The patient denies any highs or lows but has not been checking levels. Encouraged the patient to check blood sugar levels regularly  Reviewed scheduled/upcoming provider appointments including: 01-18-2021 at 2 pm;         Advised patient, providing education and rationale, to check cbg as directed  and record.  10-21-2021: The patient does not check blood sugars on a consistent basis. The patient has to be reminded on outreaches the importance of checking blood sugars on a regular basis and recording. The patient denies any issues with blood sugar meter but feels she is "too busy" to check. Extensive education on the benefits of checking blood sugars and effective management of DM on over all health and well being.        call provider for findings outside established parameters;       Review of patient status, including review of consultants reports, relevant laboratory and other test results, and medications completed;       Screening for signs and symptoms of depression related to chronic disease state;        Assessed social determinant of health barriers;         Hyperlipidemia:  (Status: Goal on Track (progressing): YES.) Lab Results  Component  Value Date   CHOL 145 10/17/2021   HDL 36 (L) 10/17/2021   LDLCALC 90 10/17/2021   TRIG 100 10/17/2021   CHOLHDL 4.0 10/17/2021     Medication review performed; medication list updated in electronic medical record.  Provider established cholesterol goals reviewed; Counseled on importance of regular laboratory monitoring as prescribed; Provided HLD educational materials; Reviewed role and benefits of statin for ASCVD risk reduction; Discussed strategies to manage statin-induced myalgias; Reviewed importance of limiting foods high in cholesterol;  Hypertension: (Status: Goal on Track (progressing): YES.) Last practice recorded BP readings:  BP Readings from Last 3 Encounters:  10/17/21 133/73  08/16/21 138/78  07/12/21 (!) 146/75  Most recent eGFR/CrCl:  Lab Results  Component Value Date   EGFR 65 10/17/2021    No components found for: CRCL  Evaluation of current treatment plan related to hypertension self management and patient's adherence to plan as established by provider;   Provided education to patient re: stroke prevention, s/s of heart attack and stroke; Reviewed prescribed diet heart healthy/ADA diet  Reviewed medications with patient and discussed importance of compliance;  Counseled on adverse effects of illicit drug and excessive alcohol use in patients with high blood pressure;  Discussed plans with patient for ongoing care management follow up and provided patient with direct contact information for care management team; Advised patient, providing education and rationale, to monitor blood pressure daily and record, calling PCP for findings outside established parameters;  Provided education on prescribed diet heart healthy/ADA diet ;  Discussed complications of poorly controlled blood pressure such as heart disease, stroke, circulatory complications, vision complications, kidney impairment, sexual dysfunction;   Patient Goals/Self-Care Activities: Patient will self  administer medications as prescribed as evidenced by self report/primary caregiver report  Patient will attend all scheduled provider appointments as evidenced by clinician review of documented attendance to scheduled appointments and patient/caregiver report Patient will call pharmacy for medication refills as evidenced by patient report and review of pharmacy fill history as appropriate Patient will attend church or other social activities as evidenced by patient report Patient will continue to perform ADL's independently as evidenced by patient/caregiver report Patient will continue to perform IADL's independently as evidenced by patient/caregiver report Patient will call provider office for new concerns or questions as evidenced by review of documented incoming telephone call notes and patient report Patient will work with [facility]  to address care coordination needs and will continue to work with the clinical team to address health care and disease management related needs as evidenced by documented adherence to scheduled care management/care coordination appointments - schedule appointment with eye doctor - check blood sugar at prescribed times: before meals and at bedtime, when you have symptoms of low or high blood sugar, before and after exercise, and encouraged the patient to take blood sugars on a consistent basis, frequent reminders given to the patient to be compliant with blood sugar checks  - check feet daily for cuts, sores or redness - enter blood sugar readings and medication or insulin into daily log - take the blood sugar log to all doctor visits - trim toenails straight across - drink 6 to 8 glasses of water each day - eat fish at least once per week - fill half of plate with vegetables - limit fast food meals to no more than 1 per week - manage portion size - prepare main meal at home 3 to 5 days each week - read food labels for fat, fiber, carbohydrates and portion size - reduce  red meat to 2 to 3 times a week - keep feet up while sitting - wash and dry feet carefully every day - wear comfortable, cotton socks - wear comfortable, well-fitting shoes - check blood pressure weekly - choose a place to take my blood pressure (home, clinic or office, retail store) - write blood pressure results in a log or diary - learn about high blood pressure - keep a blood pressure log - take blood pressure log to all doctor appointments - call doctor for signs and symptoms of high blood pressure - develop an action plan for high blood pressure - keep all doctor appointments - take medications for blood pressure exactly as prescribed - report new symptoms to your doctor - eat more whole grains, fruits and vegetables, lean meats and healthy fats - call for medicine refill 2 or 3 days before it runs out - take all medications exactly as prescribed - call doctor with any symptoms you believe are related to your medicine - call doctor when you experience any new symptoms - go to all doctor appointments as scheduled - adhere to prescribed diet: heart healthy/ADA diet        Plan:Telephone follow up appointment with care management team member scheduled for:  12-16-2021 at 53 am  [place], MSN, [facility] Mobile: [phone]

## 2021-10-21 NOTE — Patient Instructions (Signed)
Visit Information  The patient verbalized understanding of instructions, educational materials, and care plan provided today and declined offer to receive copy of patient instructions, educational materials, and care plan.   Telephone follow up appointment with care management team member scheduled for:  Noreene Larsson RN, MSN, [facility] Mobile: [phone]

## 2021-10-24 ENCOUNTER — Other Ambulatory Visit: Payer: Self-pay | Admitting: Nurse Practitioner

## 2021-10-24 ENCOUNTER — Other Ambulatory Visit: Payer: Self-pay | Admitting: Family Medicine

## 2021-10-24 NOTE — Telephone Encounter (Signed)
Future OV 12/16/21.  Approved per protocol  Requested Prescriptions  Pending Prescriptions Disp Refills  . BD PEN NEEDLE MICRO U/F 32G X 6 MM MISC [Pharmacy Med Name: BD [id number]] 100 each 0    Sig: USE 4 TO 5 TIMES DAILY WITH INSULIN USE     Endocrinology: Diabetes - Testing Supplies Passed - 10/24/2021  9:39 AM      Passed - Valid encounter within last 12 months    Recent Outpatient Visits          1 week ago Hypertension associated with diabetes ([place])   [facility] Jon Billings, NP   2 months ago Hypertension associated with diabetes ([place])   [facility] Jon Billings, NP   3 months ago Hypertension associated with diabetes ([place])   [facility] Jon Billings, NP   6 months ago 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

## 2021-10-24 NOTE — Telephone Encounter (Signed)
Requested medication (s) are due for refill today: Yes  Requested medication (s) are on the active medication list: Yes  Last refill:  01/05/21  Future visit scheduled: Yes  Notes to clinic:  Protocol indicates pt. Needs lab work.    Requested Prescriptions  Pending Prescriptions Disp Refills   TRESIBA FLEXTOUCH 100 UNIT/ML FlexTouch Pen [Pharmacy Med Name: Tyler Aas FlexTouch 100 UNIT/ML Subcutaneous Solution Pen-injector] 15 mL 0    Sig: INJECT 40 UNITS SUBCUTANEOUSLY ONCE DAILY     Endocrinology:  Diabetes - Insulins Failed - 10/24/2021  9:50 AM      Failed - HBA1C is between 0 and 7.9 and within 180 days    Hemoglobin A1C  Date Value Ref Range Status  07/13/2016 7.9  Final   HB A1C (BAYER DCA - WAIVED)  Date Value Ref Range Status  01/05/2021 9.4 (H) <7.0 % Final    Comment:                                          Diabetic Adult            <7.0                                       Healthy Adult        4.3 - 5.7                                                           (DCCT/NGSP) American Diabetes Association's Summary of Glycemic Recommendations for Adults with Diabetes: Hemoglobin A1c <7.0%. More stringent glycemic goals (A1c <6.0%) may further reduce complications at the cost of increased risk of hypoglycemia.    Hgb A1c MFr Bld  Date Value Ref Range Status  10/17/2021 8.3 (H) 4.8 - 5.6 % Final    Comment:             Prediabetes: 5.7 - 6.4          Diabetes: >6.4          Glycemic control for adults with diabetes: <7.0           Passed - Valid encounter within last 6 months    Recent Outpatient Visits           1 week ago Hypertension associated with diabetes ([place])   [facility] Jon Billings, NP   2 months ago Hypertension associated with diabetes ([place])   [facility] Jon Billings, NP   3 months ago Hypertension associated with diabetes ([place])   [facility] Jon Billings, NP   6 months ago DM type 2  with diabetic peripheral neuropathy [facility])   [facility] Jon Billings, NP   8 months ago DM type 2 with diabetic peripheral neuropathy [facility])   [facility] Jon Billings, NP       Future Appointments             In 2 months Jon Billings, NP [facility], [place]   In 7 months  [employer], [place]

## 2021-10-24 NOTE — Telephone Encounter (Signed)
Fyi.

## 2021-11-16 DIAGNOSIS — I152 Hypertension secondary to endocrine disorders: Secondary | ICD-10-CM

## 2021-11-16 DIAGNOSIS — E1142 Type 2 diabetes mellitus with diabetic polyneuropathy: Secondary | ICD-10-CM

## 2021-11-16 DIAGNOSIS — E785 Hyperlipidemia, unspecified: Secondary | ICD-10-CM | POA: Diagnosis not present

## 2021-11-16 DIAGNOSIS — E1159 Type 2 diabetes mellitus with other circulatory complications: Secondary | ICD-10-CM | POA: Diagnosis not present

## 2021-11-16 DIAGNOSIS — E1169 Type 2 diabetes mellitus with other specified complication: Secondary | ICD-10-CM | POA: Diagnosis not present

## 2021-11-24 ENCOUNTER — Telehealth: Payer: Self-pay

## 2021-11-24 NOTE — Chronic Care Management (AMB) (Signed)
Chronic Care Management Pharmacy Assistant   Name: Tami Cummings  MRN: [id number] DOB: 1946/05/30   Reason for Encounter: Disease State Diabetes Mellitus  Recent office visits:  10/17/21-Karen Mathis Dad, NP (PCP) General follow up visit. Labs ordered. will increase Trulicity to 0.9OB weekly. Follow up in 3 months. 08/16/21 Jon Billings NP - seen for hypertension - No medication changes noted - Follow up in 2 months  07/12/21 Jon Billings NP - seen for hypertension - Labs ordered - Start hydralazine 69m BID - Per provider note: "Not able to tolerated Benazepril so will hold off on ACE/ARB for kidney protection due to history of angioedema" - Follow up in 1 month.  Recent consult visits:  06/07/21 Andrew Michael BImperialfor blister of left foot - Follow up in 3 months  Hospital visits:  Admitted to the hospital on 05/19/21 due to foreign body in left foot . Discharge date was 05/19/21. [facility] -[facility]  amoxicillin-clavulanate 875-125 MG tablet, take 1 tablet by mouth 2 times daily  Hibiclens 4% external liquid, topically PRN     Medication Changes at Hospital Discharge: N/a   Medications Discontinued at Hospital Discharge: N/a   Medications that remain the same after Hospital Discharge:??  -All other medications will remain the same.  Medications: Outpatient Encounter Medications as of 11/24/2021  Medication Sig   amLODipine (NORVASC) 10 MG tablet Take 1 tablet (10 mg total) by mouth daily.   atenolol (TENORMIN) 100 MG tablet Take 1 tablet (100 mg total) by mouth daily.   BD PEN NEEDLE MICRO U/F 32G X 6 MM MISC USE 4 TO 5 TIMES DAILY WITH INSULIN USE   Blood Glucose Monitoring Suppl (ONETOUCH VERIO) w/Device KIT Use to check blood sugar 2-3 times daily and document, bring results to provider visits.   Dulaglutide (TRULICITY) 4.5 MSJ/6.2EZSOPN Inject 4.5 mg  as directed once a week.   glucose blood test strip Use to check blood sugar 2-3 times daily and document, bring results to provider visits.   hydrALAZINE (APRESOLINE) 10 MG tablet Take 1 tablet (10 mg total) by mouth 2 (two) times daily.   insulin lispro (HUMALOG KWIKPEN) 100 UNIT/ML KwikPen Inject 25 Units into the skin 2 (two) times daily before a meal.   JANUVIA 50 MG tablet Take 1 tablet by mouth once daily   JARDIANCE 25 MG TABS tablet TAKE 1 TABLET BY MOUTH ONCE DAILY BEFORE BREAKFAST   Lancets (ONETOUCH ULTRASOFT) lancets Use to check blood sugar 2-3 times daily and document, bring results to provider visits.   lovastatin (MEVACOR) 40 MG tablet Take 1 tablet (40 mg total) by mouth daily with breakfast.   OneTouch Delica Lancets 366QMISC 1 applicator by Does not apply route in the morning and at bedtime.   spironolactone (ALDACTONE) 25 MG tablet Take 1 tablet by mouth once daily   TRESIBA FLEXTOUCH 100 UNIT/ML FlexTouch Pen INJECT 40 UNITS SUBCUTANEOUSLY ONCE DAILY   No facility-administered encounter medications on file as of 11/24/2021.   Current antihyperglycemic regimen:  Trulicity 1.5 mg q Monday Tresiba 40 units qhs Humalog 25 u bid  Jardiance 25 mg qd Januvia 50 mg qd  What recent interventions/DTPs have been made to improve glycemic control:  N/A  Have there been any recent hospitalizations or ED visits since last visit with CPP? No  Patient denies hypoglycemic symptoms, including Pale, Sweaty, Shaky, Hungry, Nervous/irritable, and  Vision changes  Patient denies hyperglycemic symptoms, including blurry vision, excessive thirst, fatigue, polyuria, and weakness  How often are you checking your blood sugar?        Patient states she has not been checking her blood sugar due to her blood glucose machine not working and has not bought one since then.  What are your blood sugars ranging?  Fasting: N/A Before meals: N/A After meals: N/A Bedtime: N/A  During the week, how  often does your blood glucose drop below 70? Never  Are you checking your feet daily/regularly?  Patient states she does check her feet daily.  Adherence Review: Is the patient currently on a STATIN medication? Yes Is the patient currently on ACE/ARB medication? No Does the patient have >5 day gap between last estimated fill dates? Yes   Care Gaps: Zoster Vaccines- Shingrix:Never done Pneumonia Vaccine 49+ Years TRZ:NBVA completed: Dec 28, 2014 COVID-19 Vaccine:Last completed: Dec 30, 2020 FOOT EXAM:Last completed: Oct 15, 2020  Star Rating Drugs: Trulicity 4.5 mg Last POLIDC:30/13/14 28 DS Lovastatin 40 mg Last filled:09/29/21 90 DS Jardiance 25 mg Last filled:08/05/21 90 DS  Myriam Elta [place], [place]

## 2021-12-16 ENCOUNTER — Telehealth: Payer: Medicare Other

## 2021-12-16 ENCOUNTER — Ambulatory Visit (INDEPENDENT_AMBULATORY_CARE_PROVIDER_SITE_OTHER): Payer: Medicare Other

## 2021-12-16 DIAGNOSIS — E1142 Type 2 diabetes mellitus with diabetic polyneuropathy: Secondary | ICD-10-CM

## 2021-12-16 DIAGNOSIS — I152 Hypertension secondary to endocrine disorders: Secondary | ICD-10-CM

## 2021-12-16 DIAGNOSIS — G8929 Other chronic pain: Secondary | ICD-10-CM

## 2021-12-16 DIAGNOSIS — M25562 Pain in left knee: Secondary | ICD-10-CM

## 2021-12-16 DIAGNOSIS — E1169 Type 2 diabetes mellitus with other specified complication: Secondary | ICD-10-CM

## 2021-12-16 DIAGNOSIS — E785 Hyperlipidemia, unspecified: Secondary | ICD-10-CM

## 2021-12-16 NOTE — Chronic Care Management (AMB) (Signed)
Chronic Care Management   CCM RN Visit Note  12/16/2021 Name: Tami Cummings MRN: [id number] DOB: July 29, 1946  Subjective: Loreli Slot Verno is a 75 y.o. year old female who is a primary care patient of Jon Billings, NP. The care management team was consulted for assistance with disease management and care coordination needs.    Engaged with patient by telephone for follow up visit in response to provider referral for case management and/or care coordination services.   Consent to Services:  The patient was given information about Chronic Care Management services, agreed to services, and gave verbal consent prior to initiation of services.  Please see initial visit note for detailed documentation.   Patient agreed to services and verbal consent obtained.   Assessment: Review of patient past medical history, allergies, medications, health status, including review of consultants reports, laboratory and other test data, was performed as part of comprehensive evaluation and provision of chronic care management services.   SDOH (Social Determinants of Health) assessments and interventions performed:    CCM Care Plan  Allergies  Allergen Reactions   Actos [Pioglitazone] Other (See Comments)    CHF   Amlodipine Swelling    Leg swelling    Benazepril Other (See Comments)    angioedema   Hydrochlorothiazide Other (See Comments)    angioedema   Metformin And Related Diarrhea    Outpatient Encounter Medications as of 12/16/2021  Medication Sig   amLODipine (NORVASC) 10 MG tablet Take 1 tablet (10 mg total) by mouth daily.   atenolol (TENORMIN) 100 MG tablet Take 1 tablet (100 mg total) by mouth daily.   BD PEN NEEDLE MICRO U/F 32G X 6 MM MISC USE 4 TO 5 TIMES DAILY WITH INSULIN USE   Blood Glucose Monitoring Suppl (ONETOUCH VERIO) w/Device KIT Use to check blood sugar 2-3 times daily and document, bring results to provider visits.   Dulaglutide (TRULICITY) 4.5 QB/3.4LP  SOPN Inject 4.5 mg as directed once a week.   glucose blood test strip Use to check blood sugar 2-3 times daily and document, bring results to provider visits.   hydrALAZINE (APRESOLINE) 10 MG tablet Take 1 tablet (10 mg total) by mouth 2 (two) times daily.   insulin lispro (HUMALOG KWIKPEN) 100 UNIT/ML KwikPen Inject 25 Units into the skin 2 (two) times daily before a meal.   JANUVIA 50 MG tablet Take 1 tablet by mouth once daily   JARDIANCE 25 MG TABS tablet TAKE 1 TABLET BY MOUTH ONCE DAILY BEFORE BREAKFAST   Lancets (ONETOUCH ULTRASOFT) lancets Use to check blood sugar 2-3 times daily and document, bring results to provider visits.   lovastatin (MEVACOR) 40 MG tablet Take 1 tablet (40 mg total) by mouth daily with breakfast.   OneTouch Delica Lancets 37T MISC 1 applicator by Does not apply route in the morning and at bedtime.   spironolactone (ALDACTONE) 25 MG tablet Take 1 tablet by mouth once daily   TRESIBA FLEXTOUCH 100 UNIT/ML FlexTouch Pen INJECT 40 UNITS SUBCUTANEOUSLY ONCE DAILY   No facility-administered encounter medications on file as of 12/16/2021.    Patient Active Problem List   Diagnosis Date Noted   Osteopenia 01/01/2021   Vitamin D deficiency 10/18/2020   Vitamin B12 deficiency 10/18/2020   CKD (chronic kidney disease) stage 3, GFR 30-59 ml/min ([place]) 10/08/2020   Senile purpura ([place]) 04/10/2017   Morbid obesity ([place]) 04/10/2017   Advance care planning 04/10/2017   Noncompliance 10/30/2016   DM type 2 with diabetic peripheral neuropathy (  [place])    Hyperlipidemia associated with type 2 diabetes mellitus ([place])    Hypertension associated with diabetes ([place])     Conditions to be addressed/monitored:HTN, HLD, DMII, and left knee pain   Care Plan : RNCM: General Plan of Care (Adult) for Chronic Disease Management and Care Coordination Needs  Updates made by Vanita Ingles, RN since 12/16/2021 12:00 AM     Problem: RNCM: Development of plan of care for Chronic Disease  Management (DM, HTN, HLD, left knee pain)   Priority: High     Long-Range Goal: RNCM: Effective Management  of plan of care for Chronic Disease Management (DM, HTN, HLD, left knee pain)   Start Date: 10/21/2021  Expected End Date: 10/21/2022  Priority: High  Note:   Current Barriers:  Knowledge Deficits related to plan of care for management of HTN, HLD, and DMII and left knee pain  Chronic Disease Management support and education needs related to HTN, HLD, and DMII, and left knee pain   RNCM Clinical Goal(s):  Patient will verbalize understanding of plan for management of HTN, HLD, and DMII  and left knee pain as evidenced by keeping appointments, following the plan of care and following recommendations by the pcp and CCM team take all medications exactly as prescribed and will call provider for medication related questions as evidenced by directed and calling for refills before running out of medications    attend all scheduled medical appointments: 01-18-2021 at 2 pm as evidenced by keeping appointments        demonstrate improved and ongoing adherence to prescribed treatment plan for HTN, HLD, left knee pain, and DMII as evidenced by taking blood glucose levels on a consistent basis and compliance with dietary restrictions  demonstrate a decrease in HTN, HLD, left knee pain,  and DMII exacerbations  as evidenced by calling the office for changes in condition, questions, or concerns, seeking emergent help for acute exacerbations, and working with the CCM team to optimize health and well being demonstrate ongoing self health care management ability for effective management of chronic conditions as evidenced by working with the CCM team  through collaboration with Consulting civil engineer, provider, and care team.   Interventions: 1:1 collaboration with primary care provider regarding development and update of comprehensive plan of care as evidenced by provider attestation and  co-signature Inter-disciplinary care team collaboration (see longitudinal plan of care) Evaluation of current treatment plan related to  self management and patient's adherence to plan as established by provider   SDOH Barriers (Status: Goal on Track (progressing): YES.) Long Term Goal  Patient interviewed and SDOH assessment performed        Patient interviewed and appropriate assessments performed Provided patient with information about resources available in [facility] and care guides to assist with changes in SDOH, or new needs  Discussed plans with patient for ongoing care management follow up and provided patient with direct contact information for care management team Advised patient to call the office for changes in [place], new questions or concerns    Diabetes:  (Status: Goal on Track (progressing): YES.) Long Term Goal   Lab Results  Component Value Date   HGBA1C 8.3 (H) 10/17/2021  Assessed patient's understanding of A1c goal: <7% Provided education to patient about basic DM disease process; Reviewed medications with patient and discussed importance of medication adherence. 10-21-2021: The patient confirms she is taking Trulicity 4.5 mg as prescribed, she has also started the Vitam D at 1000 IU. 12-16-2021: The  patient is compliant with medications.         Reviewed prescribed diet with patient heart healthy/ADA. 12-16-2021: The patient is eating well and is mindful of the food she eats; Counseled on importance of regular laboratory monitoring as prescribed. 12-16-2021: The patient gets regular lab work done.        Discussed plans with patient for ongoing care management follow up and provided patient with direct contact information for care management team;      Provided patient with written educational materials related to hypo and hyperglycemia and importance of correct treatment. 12-16-2021: The patient denies any highs or lows but has not been checking levels. Encouraged  the patient to check blood sugar levels regularly       Reviewed scheduled/upcoming provider appointments including: 01-18-2021 at 2 pm;         Advised patient, providing education and rationale, to check cbg as directed  and record.  12-16-2021: The patient does not check blood sugars on a consistent basis. The patient has to be reminded on outreaches the importance of checking blood sugars on a regular basis and recording. The patient denies any issues with blood sugar meter but feels she is "too busy" to check. Extensive education on the benefits of checking blood sugars and effective management of DM on over all health and well being.        call provider for findings outside established parameters;       Review of patient status, including review of consultants reports, relevant laboratory and other test results, and medications completed;       Screening for signs and symptoms of depression related to chronic disease state;        Assessed social determinant of health barriers;         Hyperlipidemia:  (Status: Goal on Track (progressing): YES.) Lab Results  Component Value Date   CHOL 145 10/17/2021   HDL 36 (L) 10/17/2021   LDLCALC 90 10/17/2021   TRIG 100 10/17/2021   CHOLHDL 4.0 10/17/2021     Medication review performed; medication list updated in electronic medical record. 12-16-2021: the patient is compliant with medications  Provider established cholesterol goals reviewed; Counseled on importance of regular laboratory monitoring as prescribed; Provided HLD educational materials; Reviewed role and benefits of statin for ASCVD risk reduction; Discussed strategies to manage statin-induced myalgias; Reviewed importance of limiting foods high in cholesterol. 12-16-2021: Review of foods high in cholesterol   Hypertension: (Status: Goal on Track (progressing): YES.) Last practice recorded BP readings:  BP Readings from Last 3 Encounters:  10/17/21 133/73  08/16/21 138/78   07/12/21 (!) 146/75  Most recent eGFR/CrCl:  Lab Results  Component Value Date   EGFR 65 10/17/2021    No components found for: CRCL  Evaluation of current treatment plan related to hypertension self management and patient's adherence to plan as established by provider. 12-16-2021: The patient states that she is doing well and has no new concerns with HTN or heart health.   Provided education to patient re: stroke prevention, s/s of heart attack and stroke; Reviewed prescribed diet heart healthy/ADA diet. 12-16-2021: The patient states she is eating well and denies using extra salt in her foods.  Reviewed medications with patient and discussed importance of compliance. 12-16-2021: Is compliant with medications   Counseled on adverse effects of illicit drug and excessive alcohol use in patients with high blood pressure;  Discussed plans with patient for ongoing care management follow up and provided patient  with direct contact information for care management team; Advised patient, providing education and rationale, to monitor blood pressure daily and record, calling PCP for findings outside established parameters;  Provided education on prescribed diet heart healthy/ADA diet ;  Discussed complications of poorly controlled blood pressure such as heart disease, stroke, circulatory complications, vision complications, kidney impairment, sexual dysfunction;   Pain:  (Status: New goal.) Long Term Goal  Pain assessment performed. 12-16-2021: The patient states she has left knee pain rates it at a 5 today. She states she has been dealing with the pain for years and has been told that she needs a knee replacement. She denies any falls or acute distress. Encouraged the patient to discuss pain in left knee with her pcp at appointment in February. Will continue to monitor for changes.  Medications reviewed Reviewed provider established plan for pain management; Discussed importance of adherence to all  scheduled medical appointments; Counseled on the importance of reporting any/all new or changed pain symptoms or management strategies to pain management provider; Advised patient to report to care team affect of pain on daily activities; Discussed use of relaxation techniques and/or diversional activities to assist with pain reduction (distraction, imagery, relaxation, massage, acupressure, TENS, heat, and cold application; Reviewed with patient prescribed pharmacological and nonpharmacological pain relief strategies; Advised patient to discuss unresolved pain, changes in level or intensity of pain  with provider; Screening for signs and symptoms of depression related to chronic disease state;  Assessed social determinant of health barriers;    Patient Goals/Self-Care Activities: Patient will self administer medications as prescribed as evidenced by self report/primary caregiver report  Patient will attend all scheduled provider appointments as evidenced by clinician review of documented attendance to scheduled appointments and patient/caregiver report Patient will call pharmacy for medication refills as evidenced by patient report and review of pharmacy fill history as appropriate Patient will attend church or other social activities as evidenced by patient report Patient will continue to perform ADL's independently as evidenced by patient/caregiver report Patient will continue to perform IADL's independently as evidenced by patient/caregiver report Patient will call provider office for new concerns or questions as evidenced by review of documented incoming telephone call notes and patient report Patient will work with BSW to address care coordination needs and will continue to work with the clinical team to address health care and disease management related needs as evidenced by documented adherence to scheduled care management/care coordination appointments - schedule appointment with eye  doctor - check blood sugar at prescribed times: before meals and at bedtime, when you have symptoms of low or high blood sugar, before and after exercise, and encouraged the patient to take blood sugars on a consistent basis, frequent reminders given to the patient to be compliant with blood sugar checks  - check feet daily for cuts, sores or redness - enter blood sugar readings and medication or insulin into daily log - take the blood sugar log to all doctor visits - trim toenails straight across - drink 6 to 8 glasses of water each day - eat fish at least once per week - fill half of plate with vegetables - limit fast food meals to no more than 1 per week - manage portion size - prepare main meal at home 3 to 5 days each week - read food labels for fat, fiber, carbohydrates and portion size - reduce red meat to 2 to 3 times a week - keep feet up while sitting - wash and dry feet  carefully every day - wear comfortable, cotton socks - wear comfortable, well-fitting shoes - check blood pressure weekly - choose a place to take my blood pressure (home, clinic or office, retail store) - write blood pressure results in a log or diary - learn about high blood pressure - keep a blood pressure log - take blood pressure log to all doctor appointments - call doctor for signs and symptoms of high blood pressure - develop an action plan for high blood pressure - keep all doctor appointments - take medications for blood pressure exactly as prescribed - report new symptoms to your doctor - eat more whole grains, fruits and vegetables, lean meats and healthy fats - call for medicine refill 2 or 3 days before it runs out - take all medications exactly as prescribed - call doctor with any symptoms you believe are related to your medicine - call doctor when you experience any new symptoms - go to all doctor appointments as scheduled - adhere to prescribed diet: heart healthy/ADA diet         Plan:Telephone follow up appointment with care management team member scheduled for:  02-03-2022 at 12 am  [place], MSN, [facility] Mobile: [phone]

## 2021-12-16 NOTE — Patient Instructions (Signed)
Visit Information  Thank you for taking time to visit with me today. Please don't hesitate to contact me if I can be of assistance to you before our next scheduled telephone appointment.  Following are the goals we discussed today:  RNCM Clinical Goal(s):  Patient will verbalize understanding of plan for management of HTN, HLD, and DMII  and left knee pain as evidenced by keeping appointments, following the plan of care and following recommendations by the pcp and CCM team take all medications exactly as prescribed and will call provider for medication related questions as evidenced by directed and calling for refills before running out of medications    attend all scheduled medical appointments: 01-18-2021 at 2 pm as evidenced by keeping appointments        demonstrate improved and ongoing adherence to prescribed treatment plan for HTN, HLD, left knee pain, and DMII as evidenced by taking blood glucose levels on a consistent basis and compliance with dietary restrictions  demonstrate a decrease in HTN, HLD, left knee pain,  and DMII exacerbations  as evidenced by calling the office for changes in condition, questions, or concerns, seeking emergent help for acute exacerbations, and working with the CCM team to optimize health and well being demonstrate ongoing self health care management ability for effective management of chronic conditions as evidenced by working with the CCM team  through collaboration with Consulting civil engineer, provider, and care team.    Interventions: 1:1 collaboration with primary care provider regarding development and update of comprehensive plan of care as evidenced by provider attestation and co-signature Inter-disciplinary care team collaboration (see longitudinal plan of care) Evaluation of current treatment plan related to  self management and patient's adherence to plan as established by provider     SDOH Barriers (Status: Goal on Track (progressing): YES.) Long Term Goal   Patient interviewed and SDOH assessment performed        Patient interviewed and appropriate assessments performed Provided patient with information about resources available in [facility] and care guides to assist with changes in SDOH, or new needs  Discussed plans with patient for ongoing care management follow up and provided patient with direct contact information for care management team Advised patient to call the office for changes in [place], new questions or concerns       Diabetes:  (Status: Goal on Track (progressing): YES.) Long Term Goal         Lab Results  Component Value Date    HGBA1C 8.3 (H) 10/17/2021  Assessed patient's understanding of A1c goal: <7% Provided education to patient about basic DM disease process; Reviewed medications with patient and discussed importance of medication adherence. 10-21-2021: The patient confirms she is taking Trulicity 4.5 mg as prescribed, she has also started the Vitam D at 1000 IU. 12-16-2021: The patient is compliant with medications.         Reviewed prescribed diet with patient heart healthy/ADA. 12-16-2021: The patient is eating well and is mindful of the food she eats; Counseled on importance of regular laboratory monitoring as prescribed. 12-16-2021: The patient gets regular lab work done.        Discussed plans with patient for ongoing care management follow up and provided patient with direct contact information for care management team;      Provided patient with written educational materials related to hypo and hyperglycemia and importance of correct treatment. 12-16-2021: The patient denies any highs or lows but has not been checking levels. Encouraged the patient to  check blood sugar levels regularly       Reviewed scheduled/upcoming provider appointments including: 01-18-2021 at 2 pm;         Advised patient, providing education and rationale, to check cbg as directed  and record.  12-16-2021: The patient does not check blood  sugars on a consistent basis. The patient has to be reminded on outreaches the importance of checking blood sugars on a regular basis and recording. The patient denies any issues with blood sugar meter but feels she is "too busy" to check. Extensive education on the benefits of checking blood sugars and effective management of DM on over all health and well being.        call provider for findings outside established parameters;       Review of patient status, including review of consultants reports, relevant laboratory and other test results, and medications completed;       Screening for signs and symptoms of depression related to chronic disease state;        Assessed social determinant of health barriers;          Hyperlipidemia:  (Status: Goal on Track (progressing): YES.)      Lab Results  Component Value Date    CHOL 145 10/17/2021    HDL 36 (L) 10/17/2021    LDLCALC 90 10/17/2021    TRIG 100 10/17/2021    CHOLHDL 4.0 10/17/2021      Medication review performed; medication list updated in electronic medical record. 12-16-2021: the patient is compliant with medications  Provider established cholesterol goals reviewed; Counseled on importance of regular laboratory monitoring as prescribed; Provided HLD educational materials; Reviewed role and benefits of statin for ASCVD risk reduction; Discussed strategies to manage statin-induced myalgias; Reviewed importance of limiting foods high in cholesterol. 12-16-2021: Review of foods high in cholesterol    Hypertension: (Status: Goal on Track (progressing): YES.) Last practice recorded BP readings:     BP Readings from Last 3 Encounters:  10/17/21 133/73  08/16/21 138/78  07/12/21 (!) 146/75  Most recent eGFR/CrCl:       Lab Results  Component Value Date    EGFR 65 10/17/2021    No components found for: CRCL   Evaluation of current treatment plan related to hypertension self management and patient's adherence to plan as  established by provider. 12-16-2021: The patient states that she is doing well and has no new concerns with HTN or heart health.   Provided education to patient re: stroke prevention, s/s of heart attack and stroke; Reviewed prescribed diet heart healthy/ADA diet. 12-16-2021: The patient states she is eating well and denies using extra salt in her foods.  Reviewed medications with patient and discussed importance of compliance. 12-16-2021: Is compliant with medications   Counseled on adverse effects of illicit drug and excessive alcohol use in patients with high blood pressure;  Discussed plans with patient for ongoing care management follow up and provided patient with direct contact information for care management team; Advised patient, providing education and rationale, to monitor blood pressure daily and record, calling PCP for findings outside established parameters;  Provided education on prescribed diet heart healthy/ADA diet ;  Discussed complications of poorly controlled blood pressure such as heart disease, stroke, circulatory complications, vision complications, kidney impairment, sexual dysfunction;    Pain:  (Status: New goal.) Long Term Goal  Pain assessment performed. 12-16-2021: The patient states she has left knee pain rates it at a 5 today. She states she has been dealing  with the pain for years and has been told that she needs a knee replacement. She denies any falls or acute distress. Encouraged the patient to discuss pain in left knee with her pcp at appointment in February. Will continue to monitor for changes.  Medications reviewed Reviewed provider established plan for pain management; Discussed importance of adherence to all scheduled medical appointments; Counseled on the importance of reporting any/all new or changed pain symptoms or management strategies to pain management provider; Advised patient to report to care team affect of pain on daily activities; Discussed use  of relaxation techniques and/or diversional activities to assist with pain reduction (distraction, imagery, relaxation, massage, acupressure, TENS, heat, and cold application; Reviewed with patient prescribed pharmacological and nonpharmacological pain relief strategies; Advised patient to discuss unresolved pain, changes in level or intensity of pain  with provider; Screening for signs and symptoms of depression related to chronic disease state;  Assessed social determinant of health barriers;     Patient Goals/Self-Care Activities: Patient will self administer medications as prescribed as evidenced by self report/primary caregiver report  Patient will attend all scheduled provider appointments as evidenced by clinician review of documented attendance to scheduled appointments and patient/caregiver report Patient will call pharmacy for medication refills as evidenced by patient report and review of pharmacy fill history as appropriate Patient will attend church or other social activities as evidenced by patient report Patient will continue to perform ADL's independently as evidenced by patient/caregiver report Patient will continue to perform IADL's independently as evidenced by patient/caregiver report Patient will call provider office for new concerns or questions as evidenced by review of documented incoming telephone call notes and patient report Patient will work with BSW to address care coordination needs and will continue to work with the clinical team to address health care and disease management related needs as evidenced by documented adherence to scheduled care management/care coordination appointments - schedule appointment with eye doctor - check blood sugar at prescribed times: before meals and at bedtime, when you have symptoms of low or high blood sugar, before and after exercise, and encouraged the patient to take blood sugars on a consistent basis, frequent reminders given to the  patient to be compliant with blood sugar checks  - check feet daily for cuts, sores or redness - enter blood sugar readings and medication or insulin into daily log - take the blood sugar log to all doctor visits - trim toenails straight across - drink 6 to 8 glasses of water each day - eat fish at least once per week - fill half of plate with vegetables - limit fast food meals to no more than 1 per week - manage portion size - prepare main meal at home 3 to 5 days each week - read food labels for fat, fiber, carbohydrates and portion size - reduce red meat to 2 to 3 times a week - keep feet up while sitting - wash and dry feet carefully every day - wear comfortable, cotton socks - wear comfortable, well-fitting shoes - check blood pressure weekly - choose a place to take my blood pressure (home, clinic or office, retail store) - write blood pressure results in a log or diary - learn about high blood pressure - keep a blood pressure log - take blood pressure log to all doctor appointments - call doctor for signs and symptoms of high blood pressure - develop an action plan for high blood pressure - keep all doctor appointments - take medications for  blood pressure exactly as prescribed - report new symptoms to your doctor - eat more whole grains, fruits and vegetables, lean meats and healthy fats - call for medicine refill 2 or 3 days before it runs out - take all medications exactly as prescribed - call doctor with any symptoms you believe are related to your medicine - call doctor when you experience any new symptoms - go to all doctor appointments as scheduled - adhere to prescribed diet: heart healthy/ADA diet     Our next appointment is by telephone on 02-03-2022 at 1030 am  Please call the care guide team at [phone] if you need to cancel or reschedule your appointment.   If you are experiencing a Mental Health or [place] or need someone to talk to,  please call the Suicide and Crisis Lifeline: 988 call the [place] National Suicide Prevention Lifeline: (216)550-7120 or TTY: 862 615 3930 TTY (203) 763-9255) to talk to a trained counselor call 1-800-273-TALK (toll free, 24 hour hotline)   The patient verbalized understanding of instructions, educational materials, and care plan provided today and declined offer to receive copy of patient instructions, educational materials, and care plan.   Noreene Larsson RN, MSN, [facility] Mobile: [phone]

## 2021-12-17 DIAGNOSIS — I152 Hypertension secondary to endocrine disorders: Secondary | ICD-10-CM

## 2021-12-17 DIAGNOSIS — E1142 Type 2 diabetes mellitus with diabetic polyneuropathy: Secondary | ICD-10-CM

## 2021-12-17 DIAGNOSIS — E1169 Type 2 diabetes mellitus with other specified complication: Secondary | ICD-10-CM

## 2021-12-17 DIAGNOSIS — E1159 Type 2 diabetes mellitus with other circulatory complications: Secondary | ICD-10-CM

## 2021-12-17 DIAGNOSIS — E785 Hyperlipidemia, unspecified: Secondary | ICD-10-CM

## 2022-01-18 ENCOUNTER — Ambulatory Visit: Payer: Medicare Other | Admitting: Nurse Practitioner

## 2022-01-20 ENCOUNTER — Telehealth: Payer: Self-pay

## 2022-01-20 NOTE — Chronic Care Management (AMB) (Signed)
Chronic Care Management °Pharmacy Assistant  ° °Name: Tami Cummings  MRN: [id number] DOB: 03/15/1946 ° °Reason for Encounter: Disease State General ° ° °Recent office visits:  °10/17/21-Karen Holdsworth, NP (PCP) Seen for general follow up visit. Labs ordered. Increase Trulicity to 4.5mg weekly. Start 1000IU of vitamin D once daily. Follow up in 3 months. °08/16/21-Karen Holdsworth, NP (PCP) Seen for hypertension. Follow up in 2 months. ° °Recent consult visits:  °None noted ° °Hospital visits:  °None in previous 6 months ° °Medications: °Outpatient Encounter Medications as of 01/20/2022  °Medication Sig  ° amLODipine (NORVASC) 10 MG tablet Take 1 tablet (10 mg total) by mouth daily.  ° atenolol (TENORMIN) 100 MG tablet Take 1 tablet (100 mg total) by mouth daily.  ° BD PEN NEEDLE MICRO U/F 32G X 6 MM MISC USE 4 TO 5 TIMES DAILY WITH INSULIN USE  ° Blood Glucose Monitoring Suppl (ONETOUCH VERIO) w/Device KIT Use to check blood sugar 2-3 times daily and document, bring results to provider visits.  ° Dulaglutide (TRULICITY) 4.5 MG/0.5ML SOPN Inject 4.5 mg as directed once a week.  ° glucose blood test strip Use to check blood sugar 2-3 times daily and document, bring results to provider visits.  ° hydrALAZINE (APRESOLINE) 10 MG tablet Take 1 tablet (10 mg total) by mouth 2 (two) times daily.  ° insulin lispro (HUMALOG KWIKPEN) 100 UNIT/ML KwikPen Inject 25 Units into the skin 2 (two) times daily before a meal.  ° JANUVIA 50 MG tablet Take 1 tablet by mouth once daily  ° JARDIANCE 25 MG TABS tablet TAKE 1 TABLET BY MOUTH ONCE DAILY BEFORE BREAKFAST  ° Lancets (ONETOUCH ULTRASOFT) lancets Use to check blood sugar 2-3 times daily and document, bring results to provider visits.  ° lovastatin (MEVACOR) 40 MG tablet Take 1 tablet (40 mg total) by mouth daily with breakfast.  ° OneTouch Delica Lancets 33G MISC 1 applicator by Does not apply route in the morning and at bedtime.  ° spironolactone (ALDACTONE) 25 MG  tablet Take 1 tablet by mouth once daily  ° TRESIBA FLEXTOUCH 100 UNIT/ML FlexTouch Pen INJECT 40 UNITS SUBCUTANEOUSLY ONCE DAILY  ° °No facility-administered encounter medications on file as of 01/20/2022.  ° °I have attempted to reach out to the patient to complete assessment call. I have called 3x and voicemail to return phone call.  ° °Care Gaps: °Zoster Vaccines- Shingrix:Never done °Pneumonia Vaccine 65+ Years old:Last completed: Dec 28, 2014 °COVID-19 Vaccine:Last completed: Dec 30, 2020 °FOOT EXAM:Last completed: Oct 15, 2020 °COLON CANCER SCREENING ANNUAL FOBT:Last completed: Jan 06, 2021 ° °Star Rating Drugs: °Tresiba 100 unit Last filled:10/26/21 38 DS °Trulicity 4.5 mg Last filled:11/30/21 28 DS °Januvia 50 mg Last filled:10/12/21 90 DS °Lovastatin 40 mg Last filled:09/29/21 90 DS °Jardiance 25 mg Last filled:11/02/21 90 DS ° °Myriam Estrada, RMA °Health Concierge ° °

## 2022-01-26 ENCOUNTER — Ambulatory Visit: Payer: Medicare Other | Admitting: Nurse Practitioner

## 2022-01-29 ENCOUNTER — Other Ambulatory Visit: Payer: Self-pay | Admitting: Nurse Practitioner

## 2022-01-29 DIAGNOSIS — I152 Hypertension secondary to endocrine disorders: Secondary | ICD-10-CM

## 2022-01-30 NOTE — Telephone Encounter (Signed)
Requested Prescriptions  Pending Prescriptions Disp Refills   JARDIANCE 25 MG TABS tablet [Pharmacy Med Name: Jardiance 25 MG Oral Tablet] 90 tablet 0    Sig: TAKE 1 TABLET BY MOUTH ONCE DAILY BEFORE BREAKFAST     Endocrinology:  Diabetes - SGLT2 Inhibitors Failed - 01/29/2022  2:44 AM      Failed - HBA1C is between 0 and 7.9 and within 180 days    Hemoglobin A1C  Date Value Ref Range Status  07/13/2016 7.9  Final   HB A1C (BAYER DCA - WAIVED)  Date Value Ref Range Status  01/05/2021 9.4 (H) <7.0 % Final    Comment:                                          Diabetic Adult            <7.0                                       Healthy Adult        4.3 - 5.7                                                           (DCCT/NGSP) American Diabetes Association's Summary of Glycemic Recommendations for Adults with Diabetes: Hemoglobin A1c <7.0%. More stringent glycemic goals (A1c <6.0%) may further reduce complications at the cost of increased risk of hypoglycemia.    Hgb A1c MFr Bld  Date Value Ref Range Status  10/17/2021 8.3 (H) 4.8 - 5.6 % Final    Comment:             Prediabetes: 5.7 - 6.4          Diabetes: >6.4          Glycemic control for adults with diabetes: <7.0          Passed - Cr in normal range and within 360 days    Creatinine, Ser  Date Value Ref Range Status  10/17/2021 0.92 0.57 - 1.00 mg/dL Final         Passed - eGFR in normal range and within 360 days    GFR calc Af Amer  Date Value Ref Range Status  01/05/2021 72 >59 mL/min/1.73 Final    Comment:    **In accordance with recommendations from the NKF-ASN Task force,**   [facility] is in the process of updating its eGFR calculation to the   2021 CKD-EPI creatinine equation that estimates kidney function   without a race variable.    GFR calc non Af Amer  Date Value Ref Range Status  01/05/2021 63 >59 mL/min/1.73 Final   eGFR  Date Value Ref Range Status  10/17/2021 65 >59 mL/min/1.73 Final          Passed - Valid encounter within last 6 months    Recent Outpatient Visits          3 months ago Hypertension associated with diabetes [facility])   [facility] Jon Billings, NP   5 months ago Hypertension associated with diabetes [facility])   [facility] Jon Billings, NP  6 months ago Hypertension associated with diabetes ([place])   [facility] Jon Billings, NP   10 months ago DM type 2 with diabetic peripheral neuropathy ([place])   [facility] Jon Billings, NP   12 months ago DM type 2 with diabetic peripheral neuropathy ([place])   [facility] Jon Billings, NP      Future Appointments            In 4 days Jon Billings, NP [place], [facility]   In 4 months  [employer], [facility]            spironolactone (ALDACTONE) 25 MG tablet [Pharmacy Med Name: Spironolactone 25 MG Oral Tablet] 90 tablet 0    Sig: Take 1 tablet by mouth once daily     Cardiovascular: Diuretics - Aldosterone Antagonist Passed - 01/29/2022  2:44 AM      Passed - Cr in normal range and within 180 days    Creatinine, Ser  Date Value Ref Range Status  10/17/2021 0.92 0.57 - 1.00 mg/dL Final         Passed - K in normal range and within 180 days    Potassium  Date Value Ref Range Status  10/17/2021 4.3 3.5 - 5.2 mmol/L Final         Passed - Na in normal range and within 180 days    Sodium  Date Value Ref Range Status  10/17/2021 139 134 - 144 mmol/L Final         Passed - eGFR is 30 or above and within 180 days    GFR calc Af Amer  Date Value Ref Range Status  01/05/2021 72 >59 mL/min/1.73 Final    Comment:    **In accordance with recommendations from the NKF-ASN Task force,**   [facility] is in the process of updating its eGFR calculation to the   2021 CKD-EPI creatinine equation that estimates kidney function   without a race variable.    GFR calc non Af Amer  Date Value Ref Range Status  01/05/2021  63 >59 mL/min/1.73 Final   eGFR  Date Value Ref Range Status  10/17/2021 65 >59 mL/min/1.73 Final         Passed - Last BP in normal range    BP Readings from Last 1 Encounters:  10/17/21 133/73         Passed - Valid encounter within last 6 months    Recent Outpatient Visits          3 months ago Hypertension associated with diabetes ([place])   [facility] Jon Billings, NP   5 months ago Hypertension associated with diabetes [facility])   [facility] Jon Billings, NP   6 months ago Hypertension associated with diabetes [facility])   [facility] Jon Billings, NP   10 months ago DM type 2 with diabetic peripheral neuropathy [facility])   [facility] Jon Billings, NP   12 months ago DM type 2 with diabetic peripheral neuropathy [facility])   [facility] Jon Billings, NP      Future Appointments            In 4 days Jon Billings, NP [facility], [place]   In 4 months  [employer], [place]

## 2022-02-03 ENCOUNTER — Encounter: Payer: Self-pay | Admitting: Nurse Practitioner

## 2022-02-03 ENCOUNTER — Ambulatory Visit (INDEPENDENT_AMBULATORY_CARE_PROVIDER_SITE_OTHER): Payer: 59

## 2022-02-03 ENCOUNTER — Telehealth: Payer: Medicare Other

## 2022-02-03 ENCOUNTER — Ambulatory Visit (INDEPENDENT_AMBULATORY_CARE_PROVIDER_SITE_OTHER): Payer: 59 | Admitting: Nurse Practitioner

## 2022-02-03 ENCOUNTER — Other Ambulatory Visit: Payer: Self-pay

## 2022-02-03 VITALS — BP 123/69 | HR 90 | Temp 98.5°F | Wt 196.0 lb

## 2022-02-03 DIAGNOSIS — I152 Hypertension secondary to endocrine disorders: Secondary | ICD-10-CM

## 2022-02-03 DIAGNOSIS — M25562 Pain in left knee: Secondary | ICD-10-CM

## 2022-02-03 DIAGNOSIS — R413 Other amnesia: Secondary | ICD-10-CM

## 2022-02-03 DIAGNOSIS — Z23 Encounter for immunization: Secondary | ICD-10-CM

## 2022-02-03 DIAGNOSIS — E1169 Type 2 diabetes mellitus with other specified complication: Secondary | ICD-10-CM

## 2022-02-03 DIAGNOSIS — E1142 Type 2 diabetes mellitus with diabetic polyneuropathy: Secondary | ICD-10-CM

## 2022-02-03 DIAGNOSIS — E1159 Type 2 diabetes mellitus with other circulatory complications: Secondary | ICD-10-CM

## 2022-02-03 DIAGNOSIS — N1831 Chronic kidney disease, stage 3a: Secondary | ICD-10-CM

## 2022-02-03 DIAGNOSIS — E785 Hyperlipidemia, unspecified: Secondary | ICD-10-CM

## 2022-02-03 DIAGNOSIS — G8929 Other chronic pain: Secondary | ICD-10-CM

## 2022-02-03 DIAGNOSIS — Z91199 Patient's noncompliance with other medical treatment and regimen due to unspecified reason: Secondary | ICD-10-CM

## 2022-02-03 DIAGNOSIS — E559 Vitamin D deficiency, unspecified: Secondary | ICD-10-CM

## 2022-02-03 DIAGNOSIS — E538 Deficiency of other specified B group vitamins: Secondary | ICD-10-CM

## 2022-02-03 NOTE — Patient Instructions (Signed)
Visit Information  Thank you for taking time to visit with me today. Please don't hesitate to contact me if I can be of assistance to you before our next scheduled telephone appointment.  Following are the goals we discussed today:  RNCM Clinical Goal(s):  Patient will verbalize understanding of plan for management of HTN, HLD, and DMII  and left knee pain as evidenced by keeping appointments, following the plan of care and following recommendations by the pcp and CCM team take all medications exactly as prescribed and will call provider for medication related questions as evidenced by directed and calling for refills before running out of medications    attend all scheduled medical appointments: 02-03-2021 at 1:40 pm as evidenced by keeping appointments        demonstrate improved and ongoing adherence to prescribed treatment plan for HTN, HLD, left knee pain, and DMII as evidenced by taking blood glucose levels on a consistent basis and compliance with dietary restrictions  demonstrate a decrease in HTN, HLD, left knee pain,  and DMII exacerbations  as evidenced by calling the office for changes in condition, questions, or concerns, seeking emergent help for acute exacerbations, and working with the CCM team to optimize health and well being demonstrate ongoing self health care management ability for effective management of chronic conditions as evidenced by working with the CCM team  through collaboration with Consulting civil engineer, provider, and care team.    Interventions: 1:1 collaboration with primary care provider regarding development and update of comprehensive plan of care as evidenced by provider attestation and co-signature Inter-disciplinary care team collaboration (see longitudinal plan of care) Evaluation of current treatment plan related to  self management and patient's adherence to plan as established by provider     SDOH Barriers (Status: Goal on Track (progressing): YES.) Long Term  Goal  Patient interviewed and SDOH assessment performed        Patient interviewed and appropriate assessments performed Provided patient with information about resources available in [facility] and care guides to assist with changes in SDOH, or new needs  Discussed plans with patient for ongoing care management follow up and provided patient with direct contact information for care management team Advised patient to call the office for changes in [place], new questions or concerns       Diabetes:  (Status: Goal on Track (progressing): YES.) Long Term Goal         Lab Results  Component Value Date    HGBA1C 8.3 (H) 10/17/2021  Has an appointment with the pcp this afternoon at 1:40 will likely get new labs drawn (02-03-2022) Assessed patient's understanding of A1c goal: <7% Provided education to patient about basic DM disease process; Reviewed medications with patient and discussed importance of medication adherence. 10-21-2021: The patient confirms she is taking Trulicity 4.5 mg as prescribed, she has also started the Vitam D at 1000 IU. 02-03-2022: The patient is compliant with medications.         Reviewed prescribed diet with patient heart healthy/ADA. 02-03-2022: The patient is eating well and is mindful of the food she eats; Counseled on importance of regular laboratory monitoring as prescribed. 02-03-2022: The patient gets regular lab work done. The patient likely will have new lab work at her appointment today.    Discussed plans with patient for ongoing care management follow up and provided patient with direct contact information for care management team;      Provided patient with written educational materials related to hypo  and hyperglycemia and importance of correct treatment. 02-03-2022: The patient denies any highs or lows but has not been checking levels. Encouraged the patient to check blood sugar levels regularly       Reviewed scheduled/upcoming provider appointments including:  02-03-2022 at 140 pm;         Advised patient, providing education and rationale, to check cbg as directed  and record.  12-16-2021: The patient does not check blood sugars on a consistent basis. The patient has to be reminded on outreaches the importance of checking blood sugars on a regular basis and recording. The patient denies any issues with blood sugar meter but feels she is "too busy" to check. Extensive education on the benefits of checking blood sugars and effective management of DM on over all health and well being.  02-03-2022: The patient continues not to check blood sugars readings. The patient has a long standing history of not checking blood sugars. Encouraged the patient to check blood sugar readings on a regular basis. Will continue to monitor.       call provider for findings outside established parameters;       Review of patient status, including review of consultants reports, relevant laboratory and other test results, and medications completed. 02-03-2022: The patient to see the pcp today. Will likely be ordered for new lab work to evaluate A1C;       Screening for signs and symptoms of depression related to chronic disease state;        Assessed social determinant of health barriers;          Hyperlipidemia:  (Status: Goal on Track (progressing): YES.)      Lab Results  Component Value Date    CHOL 145 10/17/2021    HDL 36 (L) 10/17/2021    LDLCALC 90 10/17/2021    TRIG 100 10/17/2021    CHOLHDL 4.0 10/17/2021      Medication review performed; medication list updated in electronic medical record. 02-03-2022: the patient is compliant with medications  Provider established cholesterol goals reviewed; Counseled on importance of regular laboratory monitoring as prescribed; Provided HLD educational materials; Reviewed role and benefits of statin for ASCVD risk reduction; Discussed strategies to manage statin-induced myalgias; Reviewed importance of limiting foods high in  cholesterol. 02-03-2022: Review of foods high in cholesterol. The patient states she is eating well.   Hypertension: (Status: Goal on Track (progressing): YES.) Last practice recorded BP readings:     BP Readings from Last 3 Encounters:  10/17/21 133/73  08/16/21 138/78  07/12/21 (!) 146/75  Most recent eGFR/CrCl:       Lab Results  Component Value Date    EGFR 65 10/17/2021    No components found for: CRCL   Evaluation of current treatment plan related to hypertension self management and patient's adherence to plan as established by provider. 02-03-2022: The patient states that she is doing well and has no new concerns with HTN or heart health.   Provided education to patient re: stroke prevention, s/s of heart attack and stroke; Reviewed prescribed diet heart healthy/ADA diet. 02-03-2022: The patient states she is eating well and denies using extra salt in her foods. States compliance with a heart healthy/ADA diet Reviewed medications with patient and discussed importance of compliance. 02-03-2022: Is compliant with medications   Counseled on adverse effects of illicit drug and excessive alcohol use in patients with high blood pressure;  Discussed plans with patient for ongoing care management follow up and provided patient with  direct contact information for care management team; Advised patient, providing education and rationale, to monitor blood pressure daily and record, calling PCP for findings outside established parameters;  Provided education on prescribed diet heart healthy/ADA diet ;  Discussed complications of poorly controlled blood pressure such as heart disease, stroke, circulatory complications, vision complications, kidney impairment, sexual dysfunction;    Pain:  (Status: Goal on Track (progressing): YES.) Long Term Goal  Pain assessment performed. 12-16-2021: The patient states she has left knee pain rates it at a 5 today. She states she has been dealing with the pain for  years and has been told that she needs a knee replacement. She denies any falls or acute distress. Encouraged the patient to discuss pain in left knee with her pcp at appointment in February. Will continue to monitor for changes. 02-03-2022: The patient denies any knee pain or headache pain today. She says she is feeling well and doing great Medications reviewed. 02-03-2022: States she is compliant with medications when she takes something for pain  Reviewed provider established plan for pain management; Discussed importance of adherence to all scheduled medical appointments. 02-03-2022: Has an appointment to see the pcp this afternoon. Encouraged the patient to talk to the pcp about any new concerns or questions she may have related to pain or pain management; Counseled on the importance of reporting any/all new or changed pain symptoms or management strategies to pain management provider; Advised patient to report to care team affect of pain on daily activities; Discussed use of relaxation techniques and/or diversional activities to assist with pain reduction (distraction, imagery, relaxation, massage, acupressure, TENS, heat, and cold application; Reviewed with patient prescribed pharmacological and nonpharmacological pain relief strategies; Advised patient to discuss unresolved pain, changes in level or intensity of pain  with provider; Screening for signs and symptoms of depression related to chronic disease state;  Assessed social determinant of health barriers;     Patient Goals/Self-Care Activities: Patient will self administer medications as prescribed as evidenced by self report/primary caregiver report  Patient will attend all scheduled provider appointments as evidenced by clinician review of documented attendance to scheduled appointments and patient/caregiver report Patient will call pharmacy for medication refills as evidenced by patient report and review of pharmacy fill history as  appropriate Patient will attend church or other social activities as evidenced by patient report Patient will continue to perform ADL's independently as evidenced by patient/caregiver report Patient will continue to perform IADL's independently as evidenced by patient/caregiver report Patient will call provider office for new concerns or questions as evidenced by review of documented incoming telephone call notes and patient report Patient will work with BSW to address care coordination needs and will continue to work with the clinical team to address health care and disease management related needs as evidenced by documented adherence to scheduled care management/care coordination appointments - schedule appointment with eye doctor - check blood sugar at prescribed times: before meals and at bedtime, when you have symptoms of low or high blood sugar, before and after exercise, and encouraged the patient to take blood sugars on a consistent basis, frequent reminders given to the patient to be compliant with blood sugar checks  - check feet daily for cuts, sores or redness - enter blood sugar readings and medication or insulin into daily log - take the blood sugar log to all doctor visits - trim toenails straight across - drink 6 to 8 glasses of water each day - eat fish at least once  per week - fill half of plate with vegetables - limit fast food meals to no more than 1 per week - manage portion size - prepare main meal at home 3 to 5 days each week - read food labels for fat, fiber, carbohydrates and portion size - reduce red meat to 2 to 3 times a week - keep feet up while sitting - wash and dry feet carefully every day - wear comfortable, cotton socks - wear comfortable, well-fitting shoes - check blood pressure weekly - choose a place to take my blood pressure (home, clinic or office, retail store) - write blood pressure results in a log or diary - learn about high blood pressure -  keep a blood pressure log - take blood pressure log to all doctor appointments - call doctor for signs and symptoms of high blood pressure - develop an action plan for high blood pressure - keep all doctor appointments - take medications for blood pressure exactly as prescribed - report new symptoms to your doctor - eat more whole grains, fruits and vegetables, lean meats and healthy fats - call for medicine refill 2 or 3 days before it runs out - take all medications exactly as prescribed - call doctor with any symptoms you believe are related to your medicine - call doctor when you experience any new symptoms - go to all doctor appointments as scheduled - adhere to prescribed diet: heart healthy/ADA diet           Our next appointment is by telephone on 04-07-2022 at 1145 am  Please call the care guide team at [phone] if you need to cancel or reschedule your appointment.   If you are experiencing a Mental Health or [place] or need someone to talk to, please call the Suicide and Crisis Lifeline: 988 call the [place] National Suicide Prevention Lifeline: 510-040-7422 or TTY: 7698739466 TTY 762-603-5675) to talk to a trained counselor call 1-800-273-TALK (toll free, 24 hour hotline)   The patient verbalized understanding of instructions, educational materials, and care plan provided today and declined offer to receive copy of patient instructions, educational materials, and care plan.   Noreene Larsson RN, MSN, [facility] Mobile: [phone]

## 2022-02-03 NOTE — Progress Notes (Signed)
BP 123/69    Pulse 90    Temp 98.5 F (36.9 C) (Oral)    Wt 196 lb (88.9 kg)    SpO2 96%    BMI 38.28 kg/m    Subjective:    [name], female    DOB: 06/11/1946, 76 y.o.   MRN: [id number]  HPI: Tami Cummings is a 76 y.o. female  Chief Complaint  Patient presents with   Diabetes   Hyperlipidemia   Hypertension   HYPERTENSION / [place] Satisfied with current treatment? no Duration of hypertension: years BP monitoring frequency: not checking BP range:  BP medication side effects: no Past BP meds: amlodipine and atenolol, hydralazine, and sprinolactone Duration of hyperlipidemia: years Cholesterol medication side effects: no Cholesterol supplements: none Past cholesterol medications: lovastatin (mevacor) Medication compliance: excellent compliance Aspirin: no Recent stressors: no Recurrent headaches: no Visual changes: no Palpitations: no Dyspnea: no Chest pain: no Lower extremity edema: no Dizzy/lightheaded: no  DIABETES Hypoglycemic episodes:no Polydipsia/polyuria: no Visual disturbance: no Chest pain: no Paresthesias: no Glucose Monitoring: yes  Accucheck frequency: daily  Fasting glucose: 130  Post prandial:  Evening:  Before meals: Taking Insulin?: no  Long acting insulin:  Short acting insulin: Blood Pressure Monitoring: not checking Retinal Examination: Not up to Date Foot Exam: Up to Date Diabetic Education: Not Completed Pneumovax: Up to Date Influenza: Up to Date Aspirin: no  MEMORY ISSUES Patient states she is starting to forget some things.  She doesn't drive.  But her states her daughter says she is forgetful.  She is unable to recall what her sugars run at home.  States she will start to write them down.   6CIT Screen 06/10/2021 05/26/2019 05/23/2018  What Year? 0 points 0 points 0 points  What month? 0 points 0 points 0 points  What time? 0 points 0 points 0 points  Count back from 20 0 points 0 points  0 points  Months in reverse 4 points 0 points 0 points  Repeat phrase 2 points 2 points 2 points  Total Score _0 Relevant past medical, surgical, family and social history reviewed and updated as indicated. Interim medical history since our last visit reviewed. Allergies and medications reviewed and updated.  Review of Systems  Eyes:  Negative for visual disturbance.  Respiratory:  Negative for cough, chest tightness and shortness of breath.   Cardiovascular:  Negative for chest pain, palpitations and leg swelling.  Endocrine: Negative for polydipsia and polyuria.  Neurological:  Negative for dizziness, numbness and headaches.   Per HPI unless specifically indicated above     Objective:    BP 123/69    Pulse 90    Temp 98.5 F (36.9 C) (Oral)    Wt 196 lb (88.9 kg)    SpO2 96%    BMI 38.28 kg/m   Wt Readings from Last 3 Encounters:  02/03/22 196 lb (88.9 kg)  10/17/21 196 lb (88.9 kg)  08/16/21 199 lb 6 oz (90.4 kg)    Physical Exam Vitals and nursing note reviewed.  Constitutional:      General: She is not in acute distress.    Appearance: Normal appearance. She is obese. She is not ill-appearing, toxic-appearing or diaphoretic.  HENT:     Head: Normocephalic.     Right Ear: External ear normal.     Left Ear: External ear normal.     Nose: Nose normal.     Mouth/Throat:  Mouth: Mucous membranes are moist.     Pharynx: Oropharynx is clear.  Eyes:     General:        Right eye: No discharge.        Left eye: No discharge.     Extraocular Movements: Extraocular movements intact.     Conjunctiva/sclera: Conjunctivae normal.     Pupils: Pupils are equal, round, and reactive to light.  Cardiovascular:     Rate and Rhythm: Normal rate and regular rhythm.     Heart sounds: No murmur heard. Pulmonary:     Effort: Pulmonary effort is normal. No respiratory distress.     Breath sounds: Normal breath sounds. No wheezing or rales.  Musculoskeletal:      Cervical back: Normal range of motion and neck supple.  Skin:    General: Skin is warm and dry.     Capillary Refill: Capillary refill takes less than 2 seconds.  Neurological:     General: No focal deficit present.     Mental Status: She is alert and oriented to person, place, and time. Mental status is at baseline.  Psychiatric:        Mood and Affect: Mood normal.        Behavior: Behavior normal.        Thought Content: Thought content normal.        Judgment: Judgment normal.    Results for orders placed or performed in visit on 10/17/21  Comp Met (CMET)  Result Value Ref Range   Glucose 121 (H) 70 - 99 mg/dL   BUN 11 8 - 27 mg/dL   Creatinine, Ser 0.92 0.57 - 1.00 mg/dL   eGFR 65 >59 mL/min/1.73   BUN/Creatinine Ratio 12 12 - 28   Sodium 139 134 - 144 mmol/L   Potassium 4.3 3.5 - 5.2 mmol/L   Chloride 102 96 - 106 mmol/L   CO2 24 20 - 29 mmol/L   Calcium 9.0 8.7 - 10.3 mg/dL   Total Protein 6.7 6.0 - 8.5 g/dL   Albumin 4.2 3.7 - 4.7 g/dL   Globulin, Total 2.5 1.5 - 4.5 g/dL   Albumin/Globulin Ratio 1.7 1.2 - 2.2   Bilirubin Total 0.4 0.0 - 1.2 mg/dL   Alkaline Phosphatase 88 44 - 121 IU/L   AST 14 0 - 40 IU/L   ALT 11 0 - 32 IU/L  HgB A1c  Result Value Ref Range   Hgb A1c MFr Bld 8.3 (H) 4.8 - 5.6 %   Est. average glucose Bld gHb Est-mCnc 192 mg/dL  Lipid Profile  Result Value Ref Range   Cholesterol, Total 145 100 - 199 mg/dL   Triglycerides 100 0 - 149 mg/dL   HDL 36 (L) >39 mg/dL   VLDL Cholesterol Cal 19 5 - 40 mg/dL   LDL Chol Calc (NIH) 90 0 - 99 mg/dL   Chol/HDL Ratio 4.0 0.0 - 4.4 ratio  CBC w/Diff  Result Value Ref Range   WBC 8.9 3.4 - 10.8 x10E3/uL   RBC 5.78 (H) 3.77 - 5.28 x10E6/uL   Hemoglobin 14.4 11.1 - 15.9 g/dL   Hematocrit 44.7 34.0 - 46.6 %   MCV 77 (L) 79 - 97 fL   MCH 24.9 (L) 26.6 - 33.0 pg   MCHC 32.2 31.5 - 35.7 g/dL   RDW 14.5 11.7 - 15.4 %   Platelets 223 150 - 450 x10E3/uL   Neutrophils 75 Not Estab. %   Lymphs 16 Not Estab.  %  Monocytes 7 Not Estab. %   Eos 1 Not Estab. %   Basos 1 Not Estab. %   Neutrophils Absolute 6.7 1.4 - 7.0 x10E3/uL   Lymphocytes Absolute 1.5 0.7 - 3.1 x10E3/uL   Monocytes Absolute 0.6 0.1 - 0.9 x10E3/uL   EOS (ABSOLUTE) 0.1 0.0 - 0.4 x10E3/uL   Basophils Absolute 0.1 0.0 - 0.2 x10E3/uL   Immature Granulocytes 0 Not Estab. %   Immature Grans (Abs) 0.0 0.0 - 0.1 x10E3/uL  B12  Result Value Ref Range   Vitamin B-12 243 232 - 1,245 pg/mL  Vitamin D (25 hydroxy)  Result Value Ref Range   Vit D, 25-Hydroxy 24.8 (L) 30.0 - 100.0 ng/mL      Assessment & Plan:   Problem List Items Addressed This Visit       Cardiovascular and Mediastinum   Hypertension associated with diabetes ([place]) - Primary    Chronic.  Uncontrolled.  Last A1c was 8.4.  Labs ordered today.  Currently on Trulicity 2.9VB weekly, Januvia 5m, Jardiance 272m  Will make recommendations based on lab results.  Return to clinic in 3 months for reevaluation.  Call sooner if concerns arise.        Relevant Orders   Comp Met (CMET)     Endocrine   DM type 2 with diabetic peripheral neuropathy (HCC)    Chronic.  Uncontrolled.  Last A1c was 8.4.  Labs ordered today.  Will make recommendations based on ab results.  Return to clinic in 3 months for reevaluation.  Call sooner if concerns arise.        Relevant Orders   HgB A1c   Hyperlipidemia associated with type 2 diabetes mellitus (HCC)    Chronic.  Controlled.  Continue with current medication regimen of Lovastatin 4060maily.  Labs ordered today.  Return to clinic in 3 months for reevaluation.  Call sooner if concerns arise.        Relevant Orders   Lipid Profile     Genitourinary   CKD (chronic kidney disease) stage 3, GFR 30-59 ml/min (HCC)    Chronic.  Labs ordered today. Will make recommendations based on lab results.         Other   Morbid obesity ([place]  Recommend a healthy lifestyle through diet and exercise.       Vitamin D deficiency     Labs ordered today. Will make recommendations based on lab results.      Relevant Orders   Vitamin D (25 hydroxy)   Vitamin B12 deficiency    Labs ordered today. Will make recommendations based on lab results.      Relevant Orders   B12   Other Visit Diagnoses     Need for pneumococcal vaccination       Relevant Orders   Pneumococcal polysaccharide vaccine 23-valent greater than or equal to 2yo subcutaneous/IM (Completed)   Memory changes       Feels like she is more forgetful 6CIT is 6 and MMSE is 28. Recommend crossword puzzels, Sudoku and other memory games.         Follow up plan: Return in about 3 months (around 05/03/2022) for HTN, HLD, DM2 FU.

## 2022-02-03 NOTE — Assessment & Plan Note (Signed)
Chronic.  Labs ordered today. Will make recommendations based on lab results.  

## 2022-02-03 NOTE — Assessment & Plan Note (Signed)
Recommend a healthy lifestyle through diet and exercise.  °

## 2022-02-03 NOTE — Assessment & Plan Note (Signed)
Chronic.  Controlled.  Continue with current medication regimen of Lovastatin 40mg daily.  Labs ordered today.  Return to clinic in 3 months for reevaluation.  Call sooner if concerns arise.   

## 2022-02-03 NOTE — Assessment & Plan Note (Signed)
Labs ordered today.  Will make recommendations based on lab results. ?

## 2022-02-03 NOTE — Chronic Care Management (AMB) (Signed)
Chronic Care Management   CCM RN Visit Note  02/03/2022 Name: Tami Cummings MRN: [id number] DOB: [birth year]  Subjective: Tami Cummings is a 76 y.o. year old female who is a primary care patient of Jon Billings, NP. The care management team was consulted for assistance with disease management and care coordination needs.    Engaged with patient by telephone for follow up visit in response to provider referral for case management and/or care coordination services.   Consent to Services:  The patient was given information about Chronic Care Management services, agreed to services, and gave verbal consent prior to initiation of services.  Please see initial visit note for detailed documentation.   Patient agreed to services and verbal consent obtained.   Assessment: Review of patient past medical history, allergies, medications, health status, including review of consultants reports, laboratory and other test data, was performed as part of comprehensive evaluation and provision of chronic care management services.   SDOH (Social Determinants of Health) assessments and interventions performed:    CCM Care Plan  Allergies  Allergen Reactions   Actos [Pioglitazone] Other (See Comments)    CHF   Amlodipine Swelling    Leg swelling    Benazepril Other (See Comments)    angioedema   Hydrochlorothiazide Other (See Comments)    angioedema   Metformin And Related Diarrhea    Outpatient Encounter Medications as of 02/03/2022  Medication Sig   amLODipine (NORVASC) 10 MG tablet Take 1 tablet (10 mg total) by mouth daily.   atenolol (TENORMIN) 100 MG tablet Take 1 tablet (100 mg total) by mouth daily.   BD PEN NEEDLE MICRO U/F 32G X 6 MM MISC USE 4 TO 5 TIMES DAILY WITH INSULIN USE   Blood Glucose Monitoring Suppl (ONETOUCH VERIO) w/Device KIT Use to check blood sugar 2-3 times daily and document, bring results to provider visits.   Dulaglutide (TRULICITY) 4.5 YG/4.7UW SOPN  Inject 4.5 mg as directed once a week.   glucose blood test strip Use to check blood sugar 2-3 times daily and document, bring results to provider visits.   hydrALAZINE (APRESOLINE) 10 MG tablet Take 1 tablet (10 mg total) by mouth 2 (two) times daily.   insulin lispro (HUMALOG KWIKPEN) 100 UNIT/ML KwikPen Inject 25 Units into the skin 2 (two) times daily before a meal.   JANUVIA 50 MG tablet Take 1 tablet by mouth once daily   JARDIANCE 25 MG TABS tablet TAKE 1 TABLET BY MOUTH ONCE DAILY BEFORE BREAKFAST   Lancets (ONETOUCH ULTRASOFT) lancets Use to check blood sugar 2-3 times daily and document, bring results to provider visits.   lovastatin (MEVACOR) 40 MG tablet Take 1 tablet (40 mg total) by mouth daily with breakfast.   OneTouch Delica Lancets 72T MISC 1 applicator by Does not apply route in the morning and at bedtime.   spironolactone (ALDACTONE) 25 MG tablet Take 1 tablet by mouth once daily   TRESIBA FLEXTOUCH 100 UNIT/ML FlexTouch Pen INJECT 40 UNITS SUBCUTANEOUSLY ONCE DAILY   No facility-administered encounter medications on file as of 02/03/2022.    Patient Active Problem List   Diagnosis Date Noted   Osteopenia 01/01/2021   Vitamin D deficiency 10/18/2020   Vitamin B12 deficiency 10/18/2020   CKD (chronic kidney disease) stage 3, GFR 30-59 ml/min ([place]) 10/08/2020   Senile purpura ([place]) 04/10/2017   Morbid obesity ([place]) 04/10/2017   Advance care planning 04/10/2017   Noncompliance 10/30/2016   DM type 2 with diabetic peripheral neuropathy (  [place])    Hyperlipidemia associated with type 2 diabetes mellitus ([place])    Hypertension associated with diabetes ([place])     Conditions to be addressed/monitored:HTN, HLD, DMII, and chronic pain  Care Plan : RNCM: General Plan of Care (Adult) for Chronic Disease Management and Care Coordination Needs  Updates made by Vanita Ingles, RN since 02/03/2022 12:00 AM     Problem: RNCM: Development of plan of care for Chronic Disease Management  (DM, HTN, HLD, left knee pain)   Priority: High     Long-Range Goal: RNCM: Effective Management  of plan of care for Chronic Disease Management (DM, HTN, HLD, left knee pain)   Start Date: 10/21/2021  Expected End Date: 10/21/2022  Priority: High  Note:   Current Barriers:  Knowledge Deficits related to plan of care for management of HTN, HLD, and DMII and left knee pain  Chronic Disease Management support and education needs related to HTN, HLD, and DMII, and left knee pain   RNCM Clinical Goal(s):  Patient will verbalize understanding of plan for management of HTN, HLD, and DMII  and left knee pain as evidenced by keeping appointments, following the plan of care and following recommendations by the pcp and CCM team take all medications exactly as prescribed and will call provider for medication related questions as evidenced by directed and calling for refills before running out of medications    attend all scheduled medical appointments: 02-03-2021 at 1:40 pm as evidenced by keeping appointments        demonstrate improved and ongoing adherence to prescribed treatment plan for HTN, HLD, left knee pain, and DMII as evidenced by taking blood glucose levels on a consistent basis and compliance with dietary restrictions  demonstrate a decrease in HTN, HLD, left knee pain,  and DMII exacerbations  as evidenced by calling the office for changes in condition, questions, or concerns, seeking emergent help for acute exacerbations, and working with the CCM team to optimize health and well being demonstrate ongoing self health care management ability for effective management of chronic conditions as evidenced by working with the CCM team  through collaboration with Consulting civil engineer, provider, and care team.   Interventions: 1:1 collaboration with primary care provider regarding development and update of comprehensive plan of care as evidenced by provider attestation and co-signature Inter-disciplinary  care team collaboration (see longitudinal plan of care) Evaluation of current treatment plan related to  self management and patient's adherence to plan as established by provider   SDOH Barriers (Status: Goal on Track (progressing): YES.) Long Term Goal  Patient interviewed and SDOH assessment performed        Patient interviewed and appropriate assessments performed Provided patient with information about resources available in [facility] and care guides to assist with changes in SDOH, or new needs  Discussed plans with patient for ongoing care management follow up and provided patient with direct contact information for care management team Advised patient to call the office for changes in [place], new questions or concerns    Diabetes:  (Status: Goal on Track (progressing): YES.) Long Term Goal   Lab Results  Component Value Date   HGBA1C 8.3 (H) 10/17/2021  Has an appointment with the pcp this afternoon at 1:40 will likely get new labs drawn (02-03-2022) Assessed patient's understanding of A1c goal: <7% Provided education to patient about basic DM disease process; Reviewed medications with patient and discussed importance of medication adherence. 10-21-2021: The patient confirms she is taking Trulicity 4.5  mg as prescribed, she has also started the Vitam D at 1000 IU. 02-03-2022: The patient is compliant with medications.         Reviewed prescribed diet with patient heart healthy/ADA. 02-03-2022: The patient is eating well and is mindful of the food she eats; Counseled on importance of regular laboratory monitoring as prescribed. 02-03-2022: The patient gets regular lab work done. The patient likely will have new lab work at her appointment today.    Discussed plans with patient for ongoing care management follow up and provided patient with direct contact information for care management team;      Provided patient with written educational materials related to hypo and hyperglycemia and  importance of correct treatment. 02-03-2022: The patient denies any highs or lows but has not been checking levels. Encouraged the patient to check blood sugar levels regularly       Reviewed scheduled/upcoming provider appointments including: 02-03-2022 at 140 pm;         Advised patient, providing education and rationale, to check cbg as directed  and record.  12-16-2021: The patient does not check blood sugars on a consistent basis. The patient has to be reminded on outreaches the importance of checking blood sugars on a regular basis and recording. The patient denies any issues with blood sugar meter but feels she is "too busy" to check. Extensive education on the benefits of checking blood sugars and effective management of DM on over all health and well being.  02-03-2022: The patient continues not to check blood sugars readings. The patient has a long standing history of not checking blood sugars. Encouraged the patient to check blood sugar readings on a regular basis. Will continue to monitor.       call provider for findings outside established parameters;       Review of patient status, including review of consultants reports, relevant laboratory and other test results, and medications completed. 02-03-2022: The patient to see the pcp today. Will likely be ordered for new lab work to evaluate A1C;       Screening for signs and symptoms of depression related to chronic disease state;        Assessed social determinant of health barriers;         Hyperlipidemia:  (Status: Goal on Track (progressing): YES.) Lab Results  Component Value Date   CHOL 145 10/17/2021   HDL 36 (L) 10/17/2021   LDLCALC 90 10/17/2021   TRIG 100 10/17/2021   CHOLHDL 4.0 10/17/2021     Medication review performed; medication list updated in electronic medical record. 02-03-2022: the patient is compliant with medications  Provider established cholesterol goals reviewed; Counseled on importance of regular laboratory  monitoring as prescribed; Provided HLD educational materials; Reviewed role and benefits of statin for ASCVD risk reduction; Discussed strategies to manage statin-induced myalgias; Reviewed importance of limiting foods high in cholesterol. 02-03-2022: Review of foods high in cholesterol. The patient states she is eating well.  Hypertension: (Status: Goal on Track (progressing): YES.) Last practice recorded BP readings:  BP Readings from Last 3 Encounters:  10/17/21 133/73  08/16/21 138/78  07/12/21 (!) 146/75  Most recent eGFR/CrCl:  Lab Results  Component Value Date   EGFR 65 10/17/2021    No components found for: CRCL  Evaluation of current treatment plan related to hypertension self management and patient's adherence to plan as established by provider. 02-03-2022: The patient states that she is doing well and has no new concerns with HTN or heart  health.   Provided education to patient re: stroke prevention, s/s of heart attack and stroke; Reviewed prescribed diet heart healthy/ADA diet. 02-03-2022: The patient states she is eating well and denies using extra salt in her foods. States compliance with a heart healthy/ADA diet Reviewed medications with patient and discussed importance of compliance. 02-03-2022: Is compliant with medications   Counseled on adverse effects of illicit drug and excessive alcohol use in patients with high blood pressure;  Discussed plans with patient for ongoing care management follow up and provided patient with direct contact information for care management team; Advised patient, providing education and rationale, to monitor blood pressure daily and record, calling PCP for findings outside established parameters;  Provided education on prescribed diet heart healthy/ADA diet ;  Discussed complications of poorly controlled blood pressure such as heart disease, stroke, circulatory complications, vision complications, kidney impairment, sexual dysfunction;   Pain:   (Status: Goal on Track (progressing): YES.) Long Term Goal  Pain assessment performed. 12-16-2021: The patient states she has left knee pain rates it at a 5 today. She states she has been dealing with the pain for years and has been told that she needs a knee replacement. She denies any falls or acute distress. Encouraged the patient to discuss pain in left knee with her pcp at appointment in February. Will continue to monitor for changes. 02-03-2022: The patient denies any knee pain or headache pain today. She says she is feeling well and doing great Medications reviewed. 02-03-2022: States she is compliant with medications when she takes something for pain  Reviewed provider established plan for pain management; Discussed importance of adherence to all scheduled medical appointments. 02-03-2022: Has an appointment to see the pcp this afternoon. Encouraged the patient to talk to the pcp about any new concerns or questions she may have related to pain or pain management; Counseled on the importance of reporting any/all new or changed pain symptoms or management strategies to pain management provider; Advised patient to report to care team affect of pain on daily activities; Discussed use of relaxation techniques and/or diversional activities to assist with pain reduction (distraction, imagery, relaxation, massage, acupressure, TENS, heat, and cold application; Reviewed with patient prescribed pharmacological and nonpharmacological pain relief strategies; Advised patient to discuss unresolved pain, changes in level or intensity of pain  with provider; Screening for signs and symptoms of depression related to chronic disease state;  Assessed social determinant of health barriers;    Patient Goals/Self-Care Activities: Patient will self administer medications as prescribed as evidenced by self report/primary caregiver report  Patient will attend all scheduled provider appointments as evidenced by clinician  review of documented attendance to scheduled appointments and patient/caregiver report Patient will call pharmacy for medication refills as evidenced by patient report and review of pharmacy fill history as appropriate Patient will attend church or other social activities as evidenced by patient report Patient will continue to perform ADL's independently as evidenced by patient/caregiver report Patient will continue to perform IADL's independently as evidenced by patient/caregiver report Patient will call provider office for new concerns or questions as evidenced by review of documented incoming telephone call notes and patient report Patient will work with BSW to address care coordination needs and will continue to work with the clinical team to address health care and disease management related needs as evidenced by documented adherence to scheduled care management/care coordination appointments - schedule appointment with eye doctor - check blood sugar at prescribed times: before meals and at bedtime, when  you have symptoms of low or high blood sugar, before and after exercise, and encouraged the patient to take blood sugars on a consistent basis, frequent reminders given to the patient to be compliant with blood sugar checks  - check feet daily for cuts, sores or redness - enter blood sugar readings and medication or insulin into daily log - take the blood sugar log to all doctor visits - trim toenails straight across - drink 6 to 8 glasses of water each day - eat fish at least once per week - fill half of plate with vegetables - limit fast food meals to no more than 1 per week - manage portion size - prepare main meal at home 3 to 5 days each week - read food labels for fat, fiber, carbohydrates and portion size - reduce red meat to 2 to 3 times a week - keep feet up while sitting - wash and dry feet carefully every day - wear comfortable, cotton socks - wear comfortable, well-fitting  shoes - check blood pressure weekly - choose a place to take my blood pressure (home, clinic or office, retail store) - write blood pressure results in a log or diary - learn about high blood pressure - keep a blood pressure log - take blood pressure log to all doctor appointments - call doctor for signs and symptoms of high blood pressure - develop an action plan for high blood pressure - keep all doctor appointments - take medications for blood pressure exactly as prescribed - report new symptoms to your doctor - eat more whole grains, fruits and vegetables, lean meats and healthy fats - call for medicine refill 2 or 3 days before it runs out - take all medications exactly as prescribed - call doctor with any symptoms you believe are related to your medicine - call doctor when you experience any new symptoms - go to all doctor appointments as scheduled - adhere to prescribed diet: heart healthy/ADA diet        Plan:Telephone follow up appointment with care management team member scheduled for:  04-07-2022 at [place] am   Noreene Larsson RN, MSN, [facility] Mobile: [phone]

## 2022-02-03 NOTE — Assessment & Plan Note (Signed)
Chronic.  Uncontrolled.  Last A1c was 8.4.  Labs ordered today.  Currently on Trulicity 4.5mg  weekly, Januvia 50mg , Jardiance 25mg .  Will make recommendations based on lab results.  Return to clinic in 3 months for reevaluation.  Call sooner if concerns arise.

## 2022-02-03 NOTE — Patient Instructions (Signed)
Recommend Crossword Puzzles, Matching games, and Sudoku to help with memory

## 2022-02-03 NOTE — Assessment & Plan Note (Signed)
Chronic.  Uncontrolled.  Last A1c was 8.4.  Labs ordered today.  Will make recommendations based on ab results.  Return to clinic in 3 months for reevaluation.  Call sooner if concerns arise.

## 2022-02-04 LAB — HEMOGLOBIN A1C
Est. average glucose Bld gHb Est-mCnc: 209 mg/dL
Hgb A1c MFr Bld: 8.9 % — ABNORMAL HIGH (ref 4.8–5.6)

## 2022-02-04 LAB — LIPID PANEL
Chol/HDL Ratio: 3.7 ratio (ref 0.0–4.4)
Cholesterol, Total: 131 mg/dL (ref 100–199)
HDL: 35 mg/dL — ABNORMAL LOW (ref >39)
LDL Chol Calc (NIH): 70 mg/dL (ref 0–99)
Triglycerides: 151 mg/dL — ABNORMAL HIGH (ref 0–149)
VLDL Cholesterol Cal: 26 mg/dL (ref 5–40)

## 2022-02-04 LAB — COMPREHENSIVE METABOLIC PANEL
ALT: 16 IU/L (ref 0–32)
AST: 16 IU/L (ref 0–40)
Albumin/Globulin Ratio: 2 (ref 1.2–2.2)
Albumin: 4.4 g/dL (ref 3.7–4.7)
Alkaline Phosphatase: 94 IU/L (ref 44–121)
BUN/Creatinine Ratio: 15 (ref 12–28)
BUN: 14 mg/dL (ref 8–27)
Bilirubin Total: 0.2 mg/dL (ref 0.0–1.2)
CO2: 22 mmol/L (ref 20–29)
Calcium: 9.3 mg/dL (ref 8.7–10.3)
Chloride: 103 mmol/L (ref 96–106)
Creatinine, Ser: 0.91 mg/dL (ref 0.57–1.00)
Globulin, Total: 2.2 g/dL (ref 1.5–4.5)
Glucose: 171 mg/dL — ABNORMAL HIGH (ref 70–99)
Potassium: 4.3 mmol/L (ref 3.5–5.2)
Sodium: 141 mmol/L (ref 134–144)
Total Protein: 6.6 g/dL (ref 6.0–8.5)
eGFR: 66 mL/min/{1.73_m2} (ref >59)

## 2022-02-04 LAB — VITAMIN B12: Vitamin B-12: 219 pg/mL — ABNORMAL LOW (ref 232–1245)

## 2022-02-04 LAB — VITAMIN D 25 HYDROXY (VIT D DEFICIENCY, FRACTURES): Vit D, 25-Hydroxy: 33.7 ng/mL (ref 30.0–100.0)

## 2022-02-06 NOTE — Progress Notes (Signed)
Please let patient know that her lab work shows that her A1c increase to 8.9.  I would like her to stop the [place] (it is not adding her any benefits since she is on the Trulicity).  Please make sure she is taking Trulicity 4.5mg  weekly.  I would like her to increase her insulin to 50u of [place].  Keep the Humalog at 25u twice daily. I recommend she decrease her carbohydrate intake to help wit her A1c.   Her B12 is low.  I recommend she start an over the counter B12 supplement.  Otherwise, her blood work looks good.  Please let me know if she has any questions.

## 2022-02-14 DIAGNOSIS — E1159 Type 2 diabetes mellitus with other circulatory complications: Secondary | ICD-10-CM

## 2022-02-14 DIAGNOSIS — I152 Hypertension secondary to endocrine disorders: Secondary | ICD-10-CM

## 2022-02-14 DIAGNOSIS — E785 Hyperlipidemia, unspecified: Secondary | ICD-10-CM

## 2022-02-14 DIAGNOSIS — E1169 Type 2 diabetes mellitus with other specified complication: Secondary | ICD-10-CM

## 2022-02-14 DIAGNOSIS — E1142 Type 2 diabetes mellitus with diabetic polyneuropathy: Secondary | ICD-10-CM

## 2022-02-16 ENCOUNTER — Telehealth: Payer: Self-pay | Admitting: Nurse Practitioner

## 2022-02-16 NOTE — Telephone Encounter (Signed)
Copied from [place] [id number]. Topic: General - Other ?>> Feb 16, 2022  9:48 AM Yvette Rack wrote: ?Reason for CRM: Rosamaria Lints with [employer] seeking update on the fax request for signature for the Rx for [facility]. Cb# [phone] ?

## 2022-02-20 NOTE — Telephone Encounter (Signed)
[employer] returning call in re to this CRM  and states they also need all the medical records from last visit  [phone] ?

## 2022-02-20 NOTE — Telephone Encounter (Signed)
Form has been faxed.

## 2022-02-21 NOTE — Telephone Encounter (Signed)
Office notes have been faxed ?

## 2022-02-28 ENCOUNTER — Telehealth: Payer: Self-pay

## 2022-02-28 NOTE — Chronic Care Management (AMB) (Signed)
? ? ?  Chronic Care Management ?Pharmacy Assistant  ? ?Name: Teonia Yager  MRN: [id number] DOB: [birth year] ? ?Reason for Encounter: Disease State Diabetes Mellitus ? ? ?Recent office visits:  ?02/03/22:Karen Mathis Dad, NP (PCP) Seen for a general follow up visit. Labs ordered. Stop januvua. Increase Insulin to 50 units of Tresiba. Recommended to start on B12 supplement. ?10/17/21-Karen Mathis Dad, NP (PCP) Seen for general follow up visit. Labs ordered. Increase Trulicity to 0.1KP weekly. Start 1000IU of vitamin D once daily. Follow up in 3 months. ?08/16/21-Karen Mathis Dad, NP (PCP) Seen for hypertension. Follow up in 2 months. ? ?Recent consult visits:  ?None noted ? ?Hospital visits:  ?None in previous 6 months ? ?Medications: ?Outpatient Encounter Medications as of 02/28/2022  ?Medication Sig  ? amLODipine (NORVASC) 10 MG tablet Take 1 tablet (10 mg total) by mouth daily.  ? atenolol (TENORMIN) 100 MG tablet Take 1 tablet (100 mg total) by mouth daily.  ? BD PEN NEEDLE MICRO U/F 32G X 6 MM MISC USE 4 TO 5 TIMES DAILY WITH INSULIN USE  ? Blood Glucose Monitoring Suppl (ONETOUCH VERIO) w/Device KIT Use to check blood sugar 2-3 times daily and document, bring results to provider visits.  ? Dulaglutide (TRULICITY) 4.5 VV/7.4MO SOPN Inject 4.5 mg as directed once a week.  ? glucose blood test strip Use to check blood sugar 2-3 times daily and document, bring results to provider visits.  ? hydrALAZINE (APRESOLINE) 10 MG tablet Take 1 tablet (10 mg total) by mouth 2 (two) times daily.  ? insulin lispro (HUMALOG KWIKPEN) 100 UNIT/ML KwikPen Inject 25 Units into the skin 2 (two) times daily before a meal.  ? JANUVIA 50 MG tablet Take 1 tablet by mouth once daily  ? JARDIANCE 25 MG TABS tablet TAKE 1 TABLET BY MOUTH ONCE DAILY BEFORE BREAKFAST  ? Lancets (ONETOUCH ULTRASOFT) lancets Use to check blood sugar 2-3 times daily and document, bring results to provider visits.  ? lovastatin (MEVACOR) 40 MG tablet Take 1  tablet (40 mg total) by mouth daily with breakfast.  ? OneTouch Delica Lancets 70B MISC 1 applicator by Does not apply route in the morning and at bedtime.  ? spironolactone (ALDACTONE) 25 MG tablet Take 1 tablet by mouth once daily  ? TRESIBA FLEXTOUCH 100 UNIT/ML FlexTouch Pen INJECT 40 UNITS SUBCUTANEOUSLY ONCE DAILY  ? ?No facility-administered encounter medications on file as of 02/28/2022.  ? ?Current antihyperglycemic regimen:  ?Trulciity inject 4.5 mg once a week ?Januvia 50 mg take 1 tab daily ?Humalog inject 25 units twice daily ?Tresiba 100 unit inhject 40 units daily ? ? ?I have attempted to complete assessment call with patient. I have reached out 3x and left 3 voicemail's so patient can return phone once available. ? ? ?Adherence Review: ?Is the patient currently on a STATIN medication? Yes ?Is the patient currently on ACE/ARB medication? No ?Does the patient have >5 day gap between last estimated fill dates? Yes  ? ?Care Gaps: ?Zoster Vaccines:Never done ?[id number]-[id number] Vaccine:Last completed: Dec 30, 2020 ?FOOT EXAM:Last completed: Oct 15, 2020 ?COLON CANCER SCREENING ANNUAL FOBT:Last completed: Jan 06, 2021 ? ?Star Rating Drugs: ?Jardiance 25 mg Last filled:01/31/22 90 DS ?Tresiba 100 unit Last filled:10/26/21 38 DS ?Januvia 50 mg Last filled:10/12/21 90 DS ?Lovastatin 40 mg Last filled:12/23/21 90 DS ? ?Corrie Mckusick, RMA ?Health Concierge ? ?

## 2022-03-12 ENCOUNTER — Other Ambulatory Visit: Payer: Self-pay | Admitting: Nurse Practitioner

## 2022-03-14 NOTE — Telephone Encounter (Signed)
Requested Prescriptions  ?Pending Prescriptions Disp Refills  ?? TRESIBA FLEXTOUCH 100 UNIT/ML FlexTouch Pen [Pharmacy Med Name: Tyler Aas FlexTouch 100 UNIT/ML Subcutaneous Solution Pen-injector] 15 mL 0  ?  Sig: INJECT 40 UNITS SUBCUTANEOUSLY ONCE DAILY  ?  ? Endocrinology:  Diabetes - Insulins Failed - 03/12/2022 12:14 PM  ?  ?  Failed - HBA1C is between 0 and 7.9 and within 180 days  ?  Hemoglobin A1C  ?Date Value Ref Range Status  ?07/13/2016 7.9  Final  ? ?HB A1C (BAYER DCA - WAIVED)  ?Date Value Ref Range Status  ?01/05/2021 9.4 (H) <7.0 % Final  ?  Comment:  ?                                        Diabetic Adult            <7.0 ?                                      Healthy Adult        4.3 - 5.7 ?                                                          (DCCT/NGSP) ?American Diabetes Association's Summary of Glycemic Recommendations ?for Adults with Diabetes: Hemoglobin A1c <7.0%. More stringent ?glycemic goals (A1c <6.0%) may further reduce complications at the ?cost of increased risk of hypoglycemia. ?  ? ?Hgb A1c MFr Bld  ?Date Value Ref Range Status  ?02/03/2022 8.9 (H) 4.8 - 5.6 % Final  ?  Comment:  ?           Prediabetes: 5.7 - 6.4 ?         Diabetes: >6.4 ?         Glycemic control for adults with diabetes: <7.0 ?  ?   ?  ?  Passed - Valid encounter within last 6 months  ?  Recent Outpatient Visits   ?      ? 1 month ago Hypertension associated with diabetes ([place])  ? [place], NP  ? 4 months ago Hypertension associated with diabetes [facility])  ? [place], NP  ? 7 months ago Hypertension associated with diabetes [facility])  ? [place], NP  ? 8 months ago Hypertension associated with diabetes [facility])  ? [place], NP  ? 11 months ago DM type 2 with diabetic peripheral neuropathy ([place])  ? [facility] Jon Billings, NP  ?  ?  ?Future Appointments   ?        ? In 1  month Jon Billings, NP [facility], [facility]  ? In 3 months  [place], [facility]  ?  ? ?  ?  ?  ?? insulin lispro (HUMALOG) 100 UNIT/ML KwikPen [Pharmacy Med Name: Insulin Lispro (1 Unit Dial) 100 UNIT/ML Subcutaneous Solution Pen-injector] 15 mL 0  ?  Sig: INJECT 25 UNITS SUBCUTANEOUSLY TWICE DAILY BEFORE A MEAL  ?  ? Endocrinology:  Diabetes - Insulins Failed - 03/12/2022 12:14 PM  ?  ?  Failed - HBA1C is between 0 and 7.9 and within 180 days  ?  Hemoglobin A1C  ?Date Value Ref Range Status  ?07/13/2016 7.9  Final  ? ?HB A1C (BAYER DCA - WAIVED)  ?Date Value Ref Range Status  ?01/05/2021 9.4 (H) <7.0 % Final  ?  Comment:  ?                                        Diabetic Adult            <7.0 ?                                      Healthy Adult        4.3 - 5.7 ?                                                          (DCCT/NGSP) ?American Diabetes Association's Summary of Glycemic Recommendations ?for Adults with Diabetes: Hemoglobin A1c <7.0%. More stringent ?glycemic goals (A1c <6.0%) may further reduce complications at the ?cost of increased risk of hypoglycemia. ?  ? ?Hgb A1c MFr Bld  ?Date Value Ref Range Status  ?02/03/2022 8.9 (H) 4.8 - 5.6 % Final  ?  Comment:  ?           Prediabetes: 5.7 - 6.4 ?         Diabetes: >6.4 ?         Glycemic control for adults with diabetes: <7.0 ?  ?   ?  ?  Passed - Valid encounter within last 6 months  ?  Recent Outpatient Visits   ?      ? 1 month ago Hypertension associated with diabetes ([place])  ? [place], NP  ? 4 months ago Hypertension associated with diabetes [facility])  ? [place], NP  ? 7 months ago Hypertension associated with diabetes [facility])  ? [place], NP  ? 8 months ago Hypertension associated with diabetes [facility])  ? [place], NP  ? 11 months ago DM type 2 with diabetic peripheral neuropathy ([place])  ? [facility] [address] Jon Billings, NP  ?  ?  ?Future Appointments   ?        ? In 1 month Jon Billings, NP [facility], [facility]  ? In 3 months  [place]  ?  ? ?  ?  ?  ? ? ?

## 2022-03-28 ENCOUNTER — Other Ambulatory Visit: Payer: Self-pay | Admitting: Nurse Practitioner

## 2022-03-28 ENCOUNTER — Other Ambulatory Visit: Payer: Self-pay

## 2022-03-28 DIAGNOSIS — E7800 Pure hypercholesterolemia, unspecified: Secondary | ICD-10-CM

## 2022-03-28 DIAGNOSIS — E1159 Type 2 diabetes mellitus with other circulatory complications: Secondary | ICD-10-CM

## 2022-03-28 MED ORDER — HYDRALAZINE HCL 10 MG PO TABS: 10.0000 mg | ORAL_TABLET | Freq: Two times a day (BID) | ORAL | 1 refills | Status: DC

## 2022-03-28 MED ORDER — EMPAGLIFLOZIN 25 MG PO TABS: 25.0000 mg | ORAL_TABLET | Freq: Every day | ORAL | 0 refills | Status: DC

## 2022-03-28 MED ORDER — AMLODIPINE BESYLATE 10 MG PO TABS: 10.0000 mg | ORAL_TABLET | Freq: Every day | ORAL | 4 refills | Status: DC

## 2022-03-28 MED ORDER — SPIRONOLACTONE 25 MG PO TABS: 25.0000 mg | ORAL_TABLET | Freq: Every day | ORAL | 0 refills | Status: DC

## 2022-03-28 MED ORDER — LOVASTATIN 40 MG PO TABS: 40.0000 mg | ORAL_TABLET | Freq: Every day | ORAL | 1 refills | Status: DC

## 2022-03-28 MED ORDER — ATENOLOL 100 MG PO TABS: 100.0000 mg | ORAL_TABLET | Freq: Every day | ORAL | 4 refills | Status: DC

## 2022-03-28 NOTE — Telephone Encounter (Signed)
Patient now using the mail order pharmacy.  ?

## 2022-04-07 ENCOUNTER — Telehealth: Payer: 59

## 2022-04-07 ENCOUNTER — Ambulatory Visit (INDEPENDENT_AMBULATORY_CARE_PROVIDER_SITE_OTHER): Payer: Medicare Other

## 2022-04-07 DIAGNOSIS — E1169 Type 2 diabetes mellitus with other specified complication: Secondary | ICD-10-CM

## 2022-04-07 DIAGNOSIS — E7800 Pure hypercholesterolemia, unspecified: Secondary | ICD-10-CM

## 2022-04-07 DIAGNOSIS — I152 Hypertension secondary to endocrine disorders: Secondary | ICD-10-CM

## 2022-04-07 DIAGNOSIS — E1142 Type 2 diabetes mellitus with diabetic polyneuropathy: Secondary | ICD-10-CM

## 2022-04-07 DIAGNOSIS — G8929 Other chronic pain: Secondary | ICD-10-CM

## 2022-04-07 NOTE — Chronic Care Management (AMB) (Signed)
?Chronic Care Management  ? ?CCM RN Visit Note ? ?04/07/2022 ?Name: Alaze Garverick MRN: [id number] DOB: 02/26/1946 ? ?Subjective: ?Santresa Ward Puffenbarger is a 76 y.o. year old female who is a primary care patient of Jon Billings, NP. The care management team was consulted for assistance with disease management and care coordination needs.   ? ?Engaged with patient by telephone for follow up visit in response to provider referral for case management and/or care coordination services.  ? ?Consent to Services:  ?The patient was given information about Chronic Care Management services, agreed to services, and gave verbal consent prior to initiation of services.  Please see initial visit note for detailed documentation.  ? ?Patient agreed to services and verbal consent obtained.  ? ?Assessment: Review of patient past medical history, allergies, medications, health status, including review of consultants reports, laboratory and other test data, was performed as part of comprehensive evaluation and provision of chronic care management services.  ? ?SDOH (Social Determinants of Health) assessments and interventions performed:   ? ?CCM Care Plan ? ?Allergies  ?Allergen Reactions  ? Actos [Pioglitazone] Other (See Comments)  ?  CHF  ? Amlodipine Swelling  ?  Leg swelling ?  ? Benazepril Other (See Comments)  ?  angioedema  ? Hydrochlorothiazide Other (See Comments)  ?  angioedema  ? Metformin And Related Diarrhea  ? ? ?Outpatient Encounter Medications as of 04/07/2022  ?Medication Sig  ? amLODipine (NORVASC) 10 MG tablet Take 1 tablet (10 mg total) by mouth daily.  ? atenolol (TENORMIN) 100 MG tablet Take 1 tablet (100 mg total) by mouth daily.  ? BD PEN NEEDLE MICRO U/F 32G X 6 MM MISC USE 4 TO 5 TIMES DAILY WITH INSULIN USE  ? Blood Glucose Monitoring Suppl (ONETOUCH VERIO) w/Device KIT Use to check blood sugar 2-3 times daily and document, bring results to provider visits.  ? Dulaglutide (TRULICITY) 4.5 UU/7.2ZD SOPN  Inject 4.5 mg as directed once a week.  ? empagliflozin (JARDIANCE) 25 MG TABS tablet Take 1 tablet (25 mg total) by mouth daily before breakfast.  ? glucose blood test strip Use to check blood sugar 2-3 times daily and document, bring results to provider visits.  ? hydrALAZINE (APRESOLINE) 10 MG tablet Take 1 tablet (10 mg total) by mouth 2 (two) times daily.  ? insulin lispro (HUMALOG) 100 UNIT/ML KwikPen INJECT 25 UNITS SUBCUTANEOUSLY TWICE DAILY BEFORE A MEAL  ? Lancets (ONETOUCH ULTRASOFT) lancets Use to check blood sugar 2-3 times daily and document, bring results to provider visits.  ? lovastatin (MEVACOR) 40 MG tablet Take 1 tablet (40 mg total) by mouth daily with breakfast.  ? OneTouch Delica Lancets 66Y MISC 1 applicator by Does not apply route in the morning and at bedtime.  ? spironolactone (ALDACTONE) 25 MG tablet Take 1 tablet (25 mg total) by mouth daily.  ? TRESIBA FLEXTOUCH 100 UNIT/ML FlexTouch Pen INJECT 40 UNITS SUBCUTANEOUSLY ONCE DAILY  ? ?No facility-administered encounter medications on file as of 04/07/2022.  ? ? ?Patient Active Problem List  ? Diagnosis Date Noted  ? Osteopenia 01/01/2021  ? Vitamin D deficiency 10/18/2020  ? Vitamin B12 deficiency 10/18/2020  ? CKD (chronic kidney disease) stage 3, GFR 30-59 ml/min (HCC) 10/08/2020  ? Senile purpura ([place]) 04/10/2017  ? Morbid obesity ([place]) 04/10/2017  ? Advance care planning 04/10/2017  ? Noncompliance 10/30/2016  ? DM type 2 with diabetic peripheral neuropathy ([place])   ? Hyperlipidemia associated with type 2 diabetes mellitus ([place])   ?  Hypertension associated with diabetes ([place])   ? ? ?Conditions to be addressed/monitored:HTN, HLD, DMII, and chronic pain ? ?Care Plan : RNCM: General Plan of Care (Adult) for Chronic Disease Management and Care Coordination Needs  ?Updates made by Vanita Ingles, RN since 04/07/2022 12:00 AM  ?  ? ?Problem: RNCM: Development of plan of care for Chronic Disease Management (DM, HTN, HLD, left knee pain)    ?Priority: High  ?  ? ?Long-Range Goal: RNCM: Effective Management  of plan of care for Chronic Disease Management (DM, HTN, HLD, left knee pain)   ?Start Date: 10/21/2021  ?Expected End Date: 10/21/2022  ?Priority: High  ?Note:   ?Current Barriers:  ?Knowledge Deficits related to plan of care for management of HTN, HLD, and DMII and left knee pain  ?Chronic Disease Management support and education needs related to HTN, HLD, and DMII, and left knee pain  ? ?RNCM Clinical Goal(s):  ?Patient will verbalize understanding of plan for management of HTN, HLD, and DMII  and left knee pain as evidenced by keeping appointments, following the plan of care and following recommendations by the pcp and CCM team ?take all medications exactly as prescribed and will call provider for medication related questions as evidenced by directed and calling for refills before running out of medications    ?attend all scheduled medical appointments: 05-03-2021 at 1:40 pm as evidenced by keeping appointments        ?demonstrate improved and ongoing adherence to prescribed treatment plan for HTN, HLD, left knee pain, and DMII as evidenced by taking blood glucose levels on a consistent basis and compliance with dietary restrictions  ?demonstrate a decrease in HTN, HLD, left knee pain,  and DMII exacerbations  as evidenced by calling the office for changes in condition, questions, or concerns, seeking emergent help for acute exacerbations, and working with the CCM team to optimize health and well being ?demonstrate ongoing self health care management ability for effective management of chronic conditions as evidenced by working with the CCM team  through collaboration with Consulting civil engineer, provider, and care team.  ? ?Interventions: ?1:1 collaboration with primary care provider regarding development and update of comprehensive plan of care as evidenced by provider attestation and co-signature ?Inter-disciplinary care team collaboration (see  longitudinal plan of care) ?Evaluation of current treatment plan related to  self management and patient's adherence to plan as established by provider ? ? ?SDOH Barriers (Status: Goal on Track (progressing): YES.) Long Term Goal  ?Patient interviewed and SDOH assessment performed ?       ?Patient interviewed and appropriate assessments performed ?Provided patient with information about resources available in [facility] and care guides to assist with changes in SDOH, or new needs  ?Discussed plans with patient for ongoing care management follow up and provided patient with direct contact information for care management team ?Advised patient to call the office for changes in SDOH, new questions or concerns ? ? ? ?Diabetes:  (Status: Goal on Track (progressing): YES.) Long Term Goal  ? ?Lab Results  ?Component Value Date  ? HGBA1C 8.9 (H) 02/03/2022  ?Assessed patient's understanding of A1c goal: <7%. 04-07-2022: Review of goal of A1C. The patient knows she is above goal. Will follow up with the pcp on 05-03-2022 ?Provided education to patient about basic DM disease process. 04-07-2022: Review with the patient; ?Reviewed medications with patient and discussed importance of medication adherence. 10-21-2021: The patient confirms she is taking Trulicity 4.5 mg as prescribed, she has also  started the Vitam D at 1000 IU. 02-03-2022: The patient is compliant with medications.   04-07-2022: The patient states she is no longer taking the Jardiance, pcp discontinued. She is taking Trulicity 4.5 mg, Tresbia 50 units and Humalog 25 units BID. Denies any issues with medications compliance.       ?Reviewed prescribed diet with patient heart healthy/ADA. 02-03-2022: The patient is eating well and is mindful of the food she eats. 04-07-2022: Review and ongoing education. The patient verbalized she feels great and it does not even feel like she has DM. Review of goal of care and plan for healthy choices and effective DM management.   ?Counseled on importance of regular laboratory monitoring as prescribed. 04-07-2022: The patient gets regular lab work done. The patient likely will have new lab work at her appointment today.    ?Discussed plans wi

## 2022-04-07 NOTE — Patient Instructions (Signed)
Visit Information ? ?Thank you for taking time to visit with me today. Please don't hesitate to contact me if I can be of assistance to you before our next scheduled telephone appointment. ? ?Following are the goals we discussed today:  ?RNCM Clinical Goal(s):  ?Patient will verbalize understanding of plan for management of HTN, HLD, and DMII  and left knee pain as evidenced by keeping appointments, following the plan of care and following recommendations by the pcp and CCM team ?take all medications exactly as prescribed and will call provider for medication related questions as evidenced by directed and calling for refills before running out of medications    ?attend all scheduled medical appointments: 05-03-2021 at 1:40 pm as evidenced by keeping appointments        ?demonstrate improved and ongoing adherence to prescribed treatment plan for HTN, HLD, left knee pain, and DMII as evidenced by taking blood glucose levels on a consistent basis and compliance with dietary restrictions  ?demonstrate a decrease in HTN, HLD, left knee pain,  and DMII exacerbations  as evidenced by calling the office for changes in condition, questions, or concerns, seeking emergent help for acute exacerbations, and working with the CCM team to optimize health and well being ?demonstrate ongoing self health care management ability for effective management of chronic conditions as evidenced by working with the CCM team  through collaboration with Consulting civil engineer, provider, and care team.  ?  ?Interventions: ?1:1 collaboration with primary care provider regarding development and update of comprehensive plan of care as evidenced by provider attestation and co-signature ?Inter-disciplinary care team collaboration (see longitudinal plan of care) ?Evaluation of current treatment plan related to  self management and patient's adherence to plan as established by provider ?  ?  ?SDOH Barriers (Status: Goal on Track (progressing): YES.) Long Term  Goal  ?Patient interviewed and SDOH assessment performed ?       ?Patient interviewed and appropriate assessments performed ?Provided patient with information about resources available in [facility] and care guides to assist with changes in SDOH, or new needs  ?Discussed plans with patient for ongoing care management follow up and provided patient with direct contact information for care management team ?Advised patient to call the office for changes in SDOH, new questions or concerns ?  ?  ?  ?Diabetes:  (Status: Goal on Track (progressing): YES.) Long Term Goal  ?  ?     ?Lab Results  ?Component Value Date  ?  HGBA1C 8.9 (H) 02/03/2022  ?Assessed patient's understanding of A1c goal: <7%. 04-07-2022: Review of goal of A1C. The patient knows she is above goal. Will follow up with the pcp on 05-03-2022 ?Provided education to patient about basic DM disease process. 04-07-2022: Review with the patient; ?Reviewed medications with patient and discussed importance of medication adherence. 10-21-2021: The patient confirms she is taking Trulicity 4.5 mg as prescribed, she has also started the Vitam D at 1000 IU. 02-03-2022: The patient is compliant with medications.   04-07-2022: The patient states she is no longer taking the Jardiance, pcp discontinued. She is taking Trulicity 4.5 mg, Tresbia 50 units and Humalog 25 units BID. Denies any issues with medications compliance.       ?Reviewed prescribed diet with patient heart healthy/ADA. 02-03-2022: The patient is eating well and is mindful of the food she eats. 04-07-2022: Review and ongoing education. The patient verbalized she feels great and it does not even feel like she has DM. Review of goal of  care and plan for healthy choices and effective DM management.  ?Counseled on importance of regular laboratory monitoring as prescribed. 04-07-2022: The patient gets regular lab work done. The patient likely will have new lab work at her appointment today.    ?Discussed plans with  patient for ongoing care management follow up and provided patient with direct contact information for care management team;      ?Provided patient with written educational materials related to hypo and hyperglycemia and importance of correct treatment. 04-07-2022: The patient denies any highs or lows but has not been checking levels. Encouraged the patient to check blood sugar levels regularly. Review of sx and sx of hypo and hyperglycemia and for the patient to monitor for changes.       ?Reviewed scheduled/upcoming provider appointments including: 05-03-2022 at 140 pm;         ?Advised patient, providing education and rationale, to check cbg as directed  and record.  12-16-2021: The patient does not check blood sugars on a consistent basis. The patient has to be reminded on outreaches the importance of checking blood sugars on a regular basis and recording. The patient denies any issues with blood sugar meter but feels she is "too busy" to check. Extensive education on the benefits of checking blood sugars and effective management of DM on over all health and well being.  04-07-2022: The patient continues not to check blood sugars readings. The patient has a long standing history of not checking blood sugars. Encouraged the patient to check blood sugar readings on a regular basis. Will continue to monitor.  The patient states she does not have time to check with trying to keep up with her 21 year old granddaughter. Reviewed the benefits of checking blood sugars with the goal of fasting <130 and post prandial of <180.     ?call provider for findings outside established parameters;       ?Review of patient status, including review of consultants reports, relevant laboratory and other test results, and medications completed. 02-03-2022: The patient to see the pcp today. Will likely be ordered for new lab work to evaluate A1C. 04-07-2022: Review of goal of A1C       ?Screening for signs and symptoms of depression related  to chronic disease state;        ?Assessed social determinant of health barriers;        ?  ?Hyperlipidemia:  (Status: Goal on Track (progressing): YES.) ?     ?Lab Results  ?Component Value Date  ?  CHOL 131 02/03/2022  ?  HDL 35 (L) 02/03/2022  ?  [place] 70 02/03/2022  ?  TRIG 151 (H) 02/03/2022  ?  CHOLHDL 3.7 02/03/2022  ?  ?  ?Medication review performed; medication list updated in electronic medical record. 04-07-2022: the patient is compliant with medications. The patient takes Lovastatin 40 mg QD  ?Provider established cholesterol goals reviewed. 04-07-2022: Review and education provided; ?Counseled on importance of regular laboratory monitoring as prescribed; ?Provided HLD educational materials; ?Reviewed role and benefits of statin for ASCVD risk reduction; ?Discussed strategies to manage statin-induced myalgias; ?Reviewed importance of limiting foods high in cholesterol. 04-07-2022: Review of foods high in cholesterol. The patient states she is eating well. ?  ?Hypertension: (Status: Goal on Track (progressing): YES.) ?Last practice recorded BP readings:  ?   ?BP Readings from Last 3 Encounters:  ?02/03/22 123/69  ?10/17/21 133/73  ?08/16/21 138/78  ?Most recent eGFR/CrCl:  ?     ?Lab  Results  ?Component Value Date  ?  EGFR 66 02/03/2022  ?  No components found for: CRCL ?  ?Evaluation of current treatment plan related to hypertension self management and patient's adherence to plan as established by provider. 04-07-2022: The patient states that she is doing well and has no new concerns with HTN or heart health.  The patients readings have been stable. Does not check at home but feels great. ?Provided education to patient re: stroke prevention, s/s of heart attack and stroke; ?Reviewed prescribed diet heart healthy/ADA diet. 04-07-2022: The patient states she is eating well and denies using extra salt in her foods. States compliance with a heart healthy/ADA diet. Education on watching fried foods and  carbohydrates ?Reviewed medications with patient and discussed importance of compliance. 04-07-2022: Is compliant with medications   ?Counseled on adverse effects of illicit drug and excessive alcohol use in pati

## 2022-04-16 DIAGNOSIS — E1159 Type 2 diabetes mellitus with other circulatory complications: Secondary | ICD-10-CM

## 2022-04-16 DIAGNOSIS — E1169 Type 2 diabetes mellitus with other specified complication: Secondary | ICD-10-CM

## 2022-04-16 DIAGNOSIS — E7800 Pure hypercholesterolemia, unspecified: Secondary | ICD-10-CM

## 2022-04-16 DIAGNOSIS — E785 Hyperlipidemia, unspecified: Secondary | ICD-10-CM

## 2022-04-16 DIAGNOSIS — E1142 Type 2 diabetes mellitus with diabetic polyneuropathy: Secondary | ICD-10-CM

## 2022-04-16 DIAGNOSIS — I152 Hypertension secondary to endocrine disorders: Secondary | ICD-10-CM

## 2022-04-17 ENCOUNTER — Telehealth: Payer: Self-pay

## 2022-04-17 NOTE — Chronic Care Management (AMB) (Signed)
? ? ?Chronic Care Management ?Pharmacy Assistant  ? ?Name: Tami Cummings  MRN: [id number] DOB: 05-20-1946 ? ?Reason for Encounter: Assessment call General adherence ? ? ?Recent office visits:  ?02/03/22-Karen Mathis Dad, NP (PCP) General follow up visit. Labs ordered. Stop Januvia. Increase Insuline 50 units of Tresiba keep Humalog 25 units twice daily. Follow up in 3 months. ? ?Recent consult visits:  ?None noted ? ?Hospital visits:  ?None in previous 6 months ? ?Medications: ?Outpatient Encounter Medications as of 04/17/2022  ?Medication Sig  ? amLODipine (NORVASC) 10 MG tablet Take 1 tablet (10 mg total) by mouth daily.  ? atenolol (TENORMIN) 100 MG tablet Take 1 tablet (100 mg total) by mouth daily.  ? BD PEN NEEDLE MICRO U/F 32G X 6 MM MISC USE 4 TO 5 TIMES DAILY WITH INSULIN USE  ? Blood Glucose Monitoring Suppl (ONETOUCH VERIO) w/Device KIT Use to check blood sugar 2-3 times daily and document, bring results to provider visits.  ? Dulaglutide (TRULICITY) 4.5 TD/4.2AJ SOPN Inject 4.5 mg as directed once a week.  ? empagliflozin (JARDIANCE) 25 MG TABS tablet Take 1 tablet (25 mg total) by mouth daily before breakfast.  ? glucose blood test strip Use to check blood sugar 2-3 times daily and document, bring results to provider visits.  ? hydrALAZINE (APRESOLINE) 10 MG tablet Take 1 tablet (10 mg total) by mouth 2 (two) times daily.  ? insulin lispro (HUMALOG) 100 UNIT/ML KwikPen INJECT 25 UNITS SUBCUTANEOUSLY TWICE DAILY BEFORE A MEAL  ? Lancets (ONETOUCH ULTRASOFT) lancets Use to check blood sugar 2-3 times daily and document, bring results to provider visits.  ? lovastatin (MEVACOR) 40 MG tablet Take 1 tablet (40 mg total) by mouth daily with breakfast.  ? OneTouch Delica Lancets 68T MISC 1 applicator by Does not apply route in the morning and at bedtime.  ? spironolactone (ALDACTONE) 25 MG tablet Take 1 tablet (25 mg total) by mouth daily.  ? TRESIBA FLEXTOUCH 100 UNIT/ML FlexTouch Pen INJECT 40 UNITS  SUBCUTANEOUSLY ONCE DAILY  ? ?No facility-administered encounter medications on file as of 04/17/2022.  ? ?[place] for General Review Call ? ? ?Chart Review: ? ?Have there been any documented new, changed, or discontinued medications since last visit? Yes 02/03/22:Stop Januvia. Increase Insuline 50 units of Tresiba keep Humalog 25 units twice daily. ? ?Has there been any documented recent hospitalizations or ED visits since last visit with Clinical Pharmacist? No ? ? ?Adherence Review: ? ?Does the Clinical Pharmacist Assistant have access to adherence rates? Yes ? ?Adherence rates for STAR metric medications (List medication(s)/day supply/ last 2 fill dates). See list below note. ? ?Does the patient have >5 day gap between last estimated fill dates for any of the above medications or other medication gaps? No ? ?Reason for medication gaps. N/A ? ? ?Disease State Questions: ? ?Able to connect with Patient? Yes ?Did patient have any problems with their health recently? No ?Note problems and Concerns:N/a ? ?Have you had any admissions or emergency room visits or worsening of your condition(s) since last visit? No ?Details of ED visit, hospital visit and/or worsening condition(s):N/A ? ?Have you had any visits with new specialists or providers since your last visit? No ?Explain:N/A ? ?Have you had any new health care problem(s) since your last visit? No ?New problem(s) reported:N/A ? ?Have you run out of any of your medications since you last spoke with clinical pharmacist? No ?What caused you to run out of your medications?N/A ?Are there  any medications you are not taking as prescribed? No ?What kept you from taking your medications as prescribed?N/A ? ? ?Are you having any issues or side effects with your medications? No ?Note of issues or side effects:N/A ? ?Do you have any other health concerns or questions you want to discuss with your Clinical Pharmacist before your next visit? No ?Note  additional concerns and questions from Patient.N/A ? ?Are there any health concerns that you feel we can do a better job addressing? No ?Note Patient's response.N/A ? ?Are you having any problems with any of the following since the last visit: (select all that apply) ? None ? Details:N/A ?12. Any falls since last visit? No ? Details: ?13. Any increased or uncontrolled pain since last visit? No ? Details:N/A ?14. Next visit Type: office ?      Visit with:Karen Mathis Dad, NP ?       Date:05/03/22 ?       Time:1:40pm ? ?15. Additional Details? No  ? ? ?Care Gaps: ?Zoster Vaccines:Never done ?FOOT EXAM:Last completed: Oct 15, 2020 ?COLON CANCER SCREENING ANNUAL FOBT:Last completed: Jan 06, 2021 ? ?Star Rating Drugs: ?Jardiance 25 mg Last filled:04/04/22 90 DS ?Lovastatin 40 mg Last filled:03/28/22 100 DS ?Tresiba 100 unit Last filled:03/14/22 38 DS ?Trulcity 4.5 mg Last filled:01/03/22 28 DS ? ?Corrie Mckusick, RMA ?Health Concierge ? ?

## 2022-04-29 ENCOUNTER — Other Ambulatory Visit: Payer: Self-pay | Admitting: Nurse Practitioner

## 2022-04-29 DIAGNOSIS — E1159 Type 2 diabetes mellitus with other circulatory complications: Secondary | ICD-10-CM

## 2022-05-01 NOTE — Telephone Encounter (Signed)
Requested Prescriptions  ?Pending Prescriptions Disp Refills  ?? JARDIANCE 25 MG TABS tablet [Pharmacy Med Name: Jardiance 25 MG Oral Tablet] 90 tablet 1  ?  Sig: TAKE 1 TABLET BY MOUTH ONCE DAILY BEFORE BREAKFAST  ?  ? Endocrinology:  Diabetes - SGLT2 Inhibitors Failed - 04/29/2022  8:53 AM  ?  ?  Failed - HBA1C is between 0 and 7.9 and within 180 days  ?  Hemoglobin A1C  ?Date Value Ref Range Status  ?07/13/2016 7.9  Final  ? ?HB A1C (BAYER DCA - WAIVED)  ?Date Value Ref Range Status  ?01/05/2021 9.4 (H) <7.0 % Final  ?  Comment:  ?                                        Diabetic Adult            <7.0 ?                                      Healthy Adult        4.3 - 5.7 ?                                                          (DCCT/NGSP) ?American Diabetes Association's Summary of Glycemic Recommendations ?for Adults with Diabetes: Hemoglobin A1c <7.0%. More stringent ?glycemic goals (A1c <6.0%) may further reduce complications at the ?cost of increased risk of hypoglycemia. ?  ? ?Hgb A1c MFr Bld  ?Date Value Ref Range Status  ?02/03/2022 8.9 (H) 4.8 - 5.6 % Final  ?  Comment:  ?           Prediabetes: 5.7 - 6.4 ?         Diabetes: >6.4 ?         Glycemic control for adults with diabetes: <7.0 ?  ?   ?  ?  Passed - Cr in normal range and within 360 days  ?  Creatinine, Ser  ?Date Value Ref Range Status  ?02/03/2022 0.91 0.57 - 1.00 mg/dL Final  ?   ?  ?  Passed - eGFR in normal range and within 360 days  ?  GFR calc Af Amer  ?Date Value Ref Range Status  ?01/05/2021 72 >59 mL/min/1.73 Final  ?  Comment:  ?  **In accordance with recommendations from the NKF-ASN Task force,** ?  [facility] is in the process of updating its eGFR calculation to the ?  2021 CKD-EPI creatinine equation that estimates kidney function ?  without a race variable. ?  ? ?GFR calc non Af Amer  ?Date Value Ref Range Status  ?01/05/2021 63 >59 mL/min/1.73 Final  ? ?eGFR  ?Date Value Ref Range Status  ?02/03/2022 66 >59 mL/min/1.73 Final  ?   ?  ?   Passed - Valid encounter within last 6 months  ?  Recent Outpatient Visits   ?      ? 2 months ago Hypertension associated with diabetes ([place])  ? [place], NP  ? 6 months ago Hypertension associated with diabetes [facility])  ? [facility] Jon Billings, NP  ?  8 months ago Hypertension associated with diabetes ([place])  ? [place], NP  ? 9 months ago Hypertension associated with diabetes [facility])  ? [place], NP  ? 1 year ago DM type 2 with diabetic peripheral neuropathy ([place])  ? [facility] Jon Billings, NP  ?  ?  ?Future Appointments   ?        ? In 2 days Jon Billings, NP [facility], [place]  ? In 1 month  [place], [facility]  ?  ? ?  ?  ?  ?? spironolactone (ALDACTONE) 25 MG tablet [Pharmacy Med Name: Spironolactone 25 MG Oral Tablet] 90 tablet 1  ?  Sig: Take 1 tablet by mouth once daily  ?  ? Cardiovascular: Diuretics - Aldosterone Antagonist Passed - 04/29/2022  8:53 AM  ?  ?  Passed - Cr in normal range and within 180 days  ?  Creatinine, Ser  ?Date Value Ref Range Status  ?02/03/2022 0.91 0.57 - 1.00 mg/dL Final  ?   ?  ?  Passed - K in normal range and within 180 days  ?  Potassium  ?Date Value Ref Range Status  ?02/03/2022 4.3 3.5 - 5.2 mmol/L Final  ?   ?  ?  Passed - Na in normal range and within 180 days  ?  Sodium  ?Date Value Ref Range Status  ?02/03/2022 141 134 - 144 mmol/L Final  ?   ?  ?  Passed - eGFR is 30 or above and within 180 days  ?  GFR calc Af Amer  ?Date Value Ref Range Status  ?01/05/2021 72 >59 mL/min/1.73 Final  ?  Comment:  ?  **In accordance with recommendations from the NKF-ASN Task force,** ?  [facility] is in the process of updating its eGFR calculation to the ?  2021 CKD-EPI creatinine equation that estimates kidney function ?  without a race variable. ?  ? ?GFR calc non Af Amer  ?Date Value Ref Range Status  ?01/05/2021 63 >59  mL/min/1.73 Final  ? ?eGFR  ?Date Value Ref Range Status  ?02/03/2022 66 >59 mL/min/1.73 Final  ?   ?  ?  Passed - Last BP in normal range  ?  BP Readings from Last 1 Encounters:  ?02/03/22 123/69  ?   ?  ?  Passed - Valid encounter within last 6 months  ?  Recent Outpatient Visits   ?      ? 2 months ago Hypertension associated with diabetes ([place])  ? [place], NP  ? 6 months ago Hypertension associated with diabetes [facility])  ? [place], NP  ? 8 months ago Hypertension associated with diabetes [facility])  ? [place], NP  ? 9 months ago Hypertension associated with diabetes [facility])  ? [place], NP  ? 1 year ago DM type 2 with diabetic peripheral neuropathy ([place])  ? [facility] Jon Billings, NP  ?  ?  ?Future Appointments   ?        ? In 2 days Jon Billings, NP [facility], [place]  ? In 1 month  [place], [facility]  ?  ? ?  ?  ?  ? ?

## 2022-05-03 ENCOUNTER — Ambulatory Visit: Payer: 59 | Admitting: Nurse Practitioner

## 2022-05-05 ENCOUNTER — Ambulatory Visit (INDEPENDENT_AMBULATORY_CARE_PROVIDER_SITE_OTHER): Payer: 59 | Admitting: Nurse Practitioner

## 2022-05-05 ENCOUNTER — Encounter: Payer: Self-pay | Admitting: Nurse Practitioner

## 2022-05-05 VITALS — BP 138/74 | HR 97 | Temp 97.8°F | Wt 192.4 lb

## 2022-05-05 DIAGNOSIS — E1169 Type 2 diabetes mellitus with other specified complication: Secondary | ICD-10-CM | POA: Diagnosis not present

## 2022-05-05 DIAGNOSIS — Z0000 Encounter for general adult medical examination without abnormal findings: Secondary | ICD-10-CM | POA: Diagnosis not present

## 2022-05-05 DIAGNOSIS — N1831 Chronic kidney disease, stage 3a: Secondary | ICD-10-CM

## 2022-05-05 DIAGNOSIS — E1159 Type 2 diabetes mellitus with other circulatory complications: Secondary | ICD-10-CM | POA: Diagnosis not present

## 2022-05-05 DIAGNOSIS — E538 Deficiency of other specified B group vitamins: Secondary | ICD-10-CM

## 2022-05-05 DIAGNOSIS — I152 Hypertension secondary to endocrine disorders: Secondary | ICD-10-CM

## 2022-05-05 DIAGNOSIS — D692 Other nonthrombocytopenic purpura: Secondary | ICD-10-CM | POA: Diagnosis not present

## 2022-05-05 DIAGNOSIS — E785 Hyperlipidemia, unspecified: Secondary | ICD-10-CM

## 2022-05-05 DIAGNOSIS — E559 Vitamin D deficiency, unspecified: Secondary | ICD-10-CM

## 2022-05-05 DIAGNOSIS — E1142 Type 2 diabetes mellitus with diabetic polyneuropathy: Secondary | ICD-10-CM | POA: Diagnosis not present

## 2022-05-05 LAB — URINALYSIS, ROUTINE W REFLEX MICROSCOPIC
Bilirubin, UA: NEGATIVE
Ketones, UA: NEGATIVE
Leukocytes,UA: NEGATIVE
Nitrite, UA: NEGATIVE
Protein,UA: NEGATIVE
Specific Gravity, UA: <1.005 — ABNORMAL LOW (ref 1.005–1.030)
Urobilinogen, Ur: 0.2 mg/dL (ref 0.2–1.0)
pH, UA: 5.5 (ref 5.0–7.5)

## 2022-05-05 LAB — MICROSCOPIC EXAMINATION
Bacteria, UA: NONE SEEN
RBC, Urine: NONE SEEN /hpf (ref 0–2)

## 2022-05-05 NOTE — Assessment & Plan Note (Addendum)
Chronic.  Controlled.  Continue with current medication regimen of Hydralazine '10mg'$  BID, Spirnolactone '25mg'$ , Amlodipine '10mg'$ , and Atenolol '100mg'$ .  Labs ordered today.  Return to clinic in 3 months for reevaluation.  Call sooner if concerns arise.

## 2022-05-05 NOTE — Progress Notes (Signed)
BP 138/74   Pulse 97   Temp 97.8 F (36.6 C) (Oral)   Wt 192 lb 6.4 oz (87.3 kg)   SpO2 98%   BMI 37.58 kg/m    Subjective:    [name], female    DOB: 1946/04/19, 76 y.o.   MRN: [id number]  HPI: Tami Cummings is a 76 y.o. female presenting on 05/05/2022 for comprehensive medical examination. Current medical complaints include:none  She currently lives with: Menopausal Symptoms: no  HYPERTENSION / HYPERLIPIDEMIA Satisfied with current treatment? no Duration of hypertension: years BP monitoring frequency: not checking BP range:  BP medication side effects: no Past BP meds: amlodipine and atenolol, hydralazine, and sprinolactone Duration of hyperlipidemia: years Cholesterol medication side effects: no Cholesterol supplements: none Past cholesterol medications: lovastatin (mevacor) Medication compliance: excellent compliance Aspirin: no Recent stressors: no Recurrent headaches: no Visual changes: no Palpitations: no Dyspnea: no Chest pain: no Lower extremity edema: no Dizzy/lightheaded: no  DIABETES Hypoglycemic episodes:no Polydipsia/polyuria: no Visual disturbance: no Chest pain: no Paresthesias: no Glucose Monitoring: yes  Accucheck frequency: daily  Fasting glucose: not sure of the numbers  Post prandial:  Evening:  Before meals: Taking Insulin?: no  Long acting insulin:  Short acting insulin: Blood Pressure Monitoring: not checking Retinal Examination: Not up to Date Foot Exam: Up to Date Diabetic Education: Not Completed Pneumovax: Up to Date Influenza: Up to Date Aspirin: no  CHRONIC KIDNEY DISEASE CKD status: controlled Medications renally dose: yes Previous renal evaluation: no Pneumovax:  Up to Date Influenza Vaccine:  Up to Date  Depression Screen done today and results listed below:     05/05/2022    2:18 PM 02/03/2022    1:36 PM 10/17/2021   10:05 AM 06/10/2021    9:53 AM 01/05/2021   11:08 AM   Depression screen PHQ 2/9  Decreased Interest 0 0 0 0 0  Down, Depressed, Hopeless 0 0 0 0 0  PHQ - 2 Score 0 0 0 0 0  Altered sleeping 0 0 0    Tired, decreased energy 1 0 0    Change in appetite 0 0 0    Feeling bad or failure about yourself  0 0 0    Trouble concentrating 0 0 0    Moving slowly or fidgety/restless 0 0 0    Suicidal thoughts 0 0 0    PHQ-9 Score 1 0 0    Difficult doing work/chores Not difficult at all Not difficult at all       The patient does not have a history of falls. I did complete a risk assessment for falls. A plan of care for falls was documented.   Past Medical History:  Past Medical History:  Diagnosis Date   Arthritis    knee   COPD (chronic obstructive pulmonary disease) ([place])    Diabetes mellitus without complication (HCC)    type 2   Dyspnea    Hyperlipidemia    Hypertension    controlled   Neuromuscular disorder (HCC)    numbness in hands and feet   Shoulder pain, right    Wears dentures    upper and lower    Surgical History:  Past Surgical History:  Procedure Laterality Date   BREAST BIOPSY Right 04/12/2016   stereo bx/clip- neg   CARPAL TUNNEL RELEASE  06/17/2000   CATARACT EXTRACTION W/PHACO Left 09/20/2015   Procedure: CATARACT EXTRACTION PHACO AND INTRAOCULAR LENS PLACEMENT ([place]);  Surgeon: Ronnell Freshwater, MD;  Location: [facility];  Service: Ophthalmology;  Laterality: Left;  DIABETIC - insulin   CATARACT EXTRACTION W/PHACO Right 06/03/2018   Procedure: CATARACT EXTRACTION PHACO AND INTRAOCULAR LENS PLACEMENT ([place]) RIGHT DIABETES;  Surgeon: Eulogio Bear, MD;  Location: [facility];  Service: Ophthalmology;  Laterality: Right;  Diabetes-insulin dependent   CESAREAN SECTION     x2   CHOLECYSTECTOMY     EYE SURGERY     TOTAL ABDOMINAL HYSTERECTOMY W/ BILATERAL SALPINGOOPHORECTOMY      Medications:  Current Outpatient Medications on File Prior to Visit  Medication Sig   amLODipine  (NORVASC) 10 MG tablet Take 1 tablet (10 mg total) by mouth daily.   atenolol (TENORMIN) 100 MG tablet Take 1 tablet (100 mg total) by mouth daily.   BD PEN NEEDLE MICRO U/F 32G X 6 MM MISC USE 4 TO 5 TIMES DAILY WITH INSULIN USE   Blood Glucose Monitoring Suppl (ONETOUCH VERIO) w/Device KIT Use to check blood sugar 2-3 times daily and document, bring results to provider visits.   Dulaglutide (TRULICITY) 4.5 PP/2.9JJ SOPN Inject 4.5 mg as directed once a week.   glucose blood test strip Use to check blood sugar 2-3 times daily and document, bring results to provider visits.   hydrALAZINE (APRESOLINE) 10 MG tablet Take 1 tablet (10 mg total) by mouth 2 (two) times daily.   insulin lispro (HUMALOG) 100 UNIT/ML KwikPen INJECT 25 UNITS SUBCUTANEOUSLY TWICE DAILY BEFORE A MEAL   JARDIANCE 25 MG TABS tablet TAKE 1 TABLET BY MOUTH ONCE DAILY BEFORE BREAKFAST   Lancets (ONETOUCH ULTRASOFT) lancets Use to check blood sugar 2-3 times daily and document, bring results to provider visits.   lovastatin (MEVACOR) 40 MG tablet Take 1 tablet (40 mg total) by mouth daily with breakfast.   OneTouch Delica Lancets 88C MISC 1 applicator by Does not apply route in the morning and at bedtime.   spironolactone (ALDACTONE) 25 MG tablet Take 1 tablet by mouth once daily   TRESIBA FLEXTOUCH 100 UNIT/ML FlexTouch Pen INJECT 40 UNITS SUBCUTANEOUSLY ONCE DAILY   No current facility-administered medications on file prior to visit.    Allergies:  Allergies  Allergen Reactions   Actos [Pioglitazone] Other (See Comments)    CHF   Amlodipine Swelling    Leg swelling    Benazepril Other (See Comments)    angioedema   Hydrochlorothiazide Other (See Comments)    angioedema   Metformin And Related Diarrhea    Social History:  Social History   Socioeconomic History   Marital status: Single    Spouse name: Not on file   Number of children: Not on file   Years of education: Not on file   Highest education level:  High school graduate  Occupational History   Occupation: retired   Tobacco Use   Smoking status: Former    Packs/day: 1.00    Years: 20.00    Pack years: 20.00    Types: Cigarettes    Quit date: 08/26/2013    Years since quitting: 8.6   Smokeless tobacco: Never  Vaping Use   Vaping Use: Never used  Substance and Sexual Activity   Alcohol use: No    Alcohol/week: 0.0 standard drinks   Drug use: No   Sexual activity: Not Currently  Other Topics Concern   Not on file  Social History Narrative   Daughter lives with her currently    Takes care of granddaughter    Social Determinants of Health   Financial  Resource Strain: Low Risk    Difficulty of Paying Living Expenses: Not hard at all  Food Insecurity: No Food Insecurity   Worried About Charity fundraiser in the Last Year: Never true   Ran Out of Food in the Last Year: Never true  Transportation Needs: No Transportation Needs   Lack of Transportation (Medical): No   Lack of Transportation (Non-Medical): No  Physical Activity: Inactive   Days of Exercise per Week: 0 days   Minutes of Exercise per Session: 0 min  Stress: No Stress Concern Present   Feeling of Stress : Not at all  Social Connections: Socially Isolated   Frequency of Communication with Friends and Family: More than three times a week   Frequency of Social Gatherings with Friends and Family: More than three times a week   Attends Religious Services: Never   Marine scientist or Organizations: No   Attends Music therapist: Never   Marital Status: Divorced  Human resources officer Violence: Not At Risk   Fear of Current or Ex-Partner: No   Emotionally Abused: No   Physically Abused: No   Sexually Abused: No   Social History   Tobacco Use  Smoking Status Former   Packs/day: 1.00   Years: 20.00   Pack years: 20.00   Types: Cigarettes   Quit date: 08/26/2013   Years since quitting: 8.6  Smokeless Tobacco Never   Social History   Substance  and Sexual Activity  Alcohol Use No   Alcohol/week: 0.0 standard drinks    Family History:  Family History  Problem Relation Age of Onset   Heart disease Father    Cancer Sister    Cancer Brother    Breast cancer Neg Hx     Past medical history, surgical history, medications, allergies, family history and social history reviewed with patient today and changes made to appropriate areas of the chart.   Review of Systems  HENT:         Denies vision changes.  Eyes:  Negative for blurred vision and double vision.  Respiratory:  Negative for shortness of breath.   Cardiovascular:  Negative for chest pain, palpitations and leg swelling.  Neurological:  Negative for dizziness, tingling and headaches.  Endo/Heme/Allergies:  Negative for polydipsia.       Denies Polyuria  All other ROS negative except what is listed above and in the HPI.      Objective:    BP 138/74   Pulse 97   Temp 97.8 F (36.6 C) (Oral)   Wt 192 lb 6.4 oz (87.3 kg)   SpO2 98%   BMI 37.58 kg/m   Wt Readings from Last 3 Encounters:  05/05/22 192 lb 6.4 oz (87.3 kg)  02/03/22 196 lb (88.9 kg)  10/17/21 196 lb (88.9 kg)    Physical Exam Vitals and nursing note reviewed.  Constitutional:      General: She is awake. She is not in acute distress.    Appearance: Normal appearance. She is well-developed. She is obese. She is not ill-appearing.  HENT:     Head: Normocephalic and atraumatic.     Right Ear: Hearing, tympanic membrane, ear canal and external ear normal. No drainage.     Left Ear: Hearing, tympanic membrane, ear canal and external ear normal. No drainage.     Nose: Nose normal.     Right Sinus: No maxillary sinus tenderness or frontal sinus tenderness.     Left Sinus: No maxillary sinus  tenderness or frontal sinus tenderness.     Mouth/Throat:     Mouth: Mucous membranes are moist.     Pharynx: Oropharynx is clear. Uvula midline. No pharyngeal swelling, oropharyngeal exudate or posterior  oropharyngeal erythema.  Eyes:     General: Lids are normal.        Right eye: No discharge.        Left eye: No discharge.     Extraocular Movements: Extraocular movements intact.     Conjunctiva/sclera: Conjunctivae normal.     Pupils: Pupils are equal, round, and reactive to light.     Visual Fields: Right eye visual fields normal and left eye visual fields normal.  Neck:     Thyroid: No thyromegaly.     Vascular: No carotid bruit.     Trachea: Trachea normal.  Cardiovascular:     Rate and Rhythm: Normal rate and regular rhythm.     Heart sounds: Normal heart sounds. No murmur heard.   No gallop.  Pulmonary:     Effort: Pulmonary effort is normal. No accessory muscle usage or respiratory distress.     Breath sounds: Normal breath sounds.  Chest:  Breasts:    Right: Normal.     Left: Normal.  Abdominal:     General: Bowel sounds are normal.     Palpations: Abdomen is soft. There is no hepatomegaly or splenomegaly.     Tenderness: There is no abdominal tenderness.  Musculoskeletal:        General: Normal range of motion.     Cervical back: Normal range of motion and neck supple.     Right lower leg: No edema.     Left lower leg: No edema.  Lymphadenopathy:     Head:     Right side of head: No submental, submandibular, tonsillar, preauricular or posterior auricular adenopathy.     Left side of head: No submental, submandibular, tonsillar, preauricular or posterior auricular adenopathy.     Cervical: No cervical adenopathy.     Upper Body:     Right upper body: No supraclavicular, axillary or pectoral adenopathy.     Left upper body: No supraclavicular, axillary or pectoral adenopathy.  Skin:    General: Skin is warm and dry.     Capillary Refill: Capillary refill takes less than 2 seconds.     Findings: No rash.  Neurological:     Mental Status: She is alert and oriented to person, place, and time.     Gait: Gait is intact.     Deep Tendon Reflexes: Reflexes are normal  and symmetric.     Reflex Scores:      Brachioradialis reflexes are 2+ on the right side and 2+ on the left side.      Patellar reflexes are 2+ on the right side and 2+ on the left side. Psychiatric:        Attention and Perception: Attention normal.        Mood and Affect: Mood normal.        Speech: Speech normal.        Behavior: Behavior normal. Behavior is cooperative.        Thought Content: Thought content normal.        Judgment: Judgment normal.    Results for orders placed or performed in visit on 02/03/22  Comp Met (CMET)  Result Value Ref Range   Glucose 171 (H) 70 - 99 mg/dL   BUN 14 8 - 27 mg/dL   Creatinine,  Ser 0.91 0.57 - 1.00 mg/dL   eGFR 66 >59 mL/min/1.73   BUN/Creatinine Ratio 15 12 - 28   Sodium 141 134 - 144 mmol/L   Potassium 4.3 3.5 - 5.2 mmol/L   Chloride 103 96 - 106 mmol/L   CO2 22 20 - 29 mmol/L   Calcium 9.3 8.7 - 10.3 mg/dL   Total Protein 6.6 6.0 - 8.5 g/dL   Albumin 4.4 3.7 - 4.7 g/dL   Globulin, Total 2.2 1.5 - 4.5 g/dL   Albumin/Globulin Ratio 2.0 1.2 - 2.2   Bilirubin Total 0.2 0.0 - 1.2 mg/dL   Alkaline Phosphatase 94 44 - 121 IU/L   AST 16 0 - 40 IU/L   ALT 16 0 - 32 IU/L  HgB A1c  Result Value Ref Range   Hgb A1c MFr Bld 8.9 (H) 4.8 - 5.6 %   Est. average glucose Bld gHb Est-mCnc 209 mg/dL  Lipid Profile  Result Value Ref Range   Cholesterol, Total 131 100 - 199 mg/dL   Triglycerides 151 (H) 0 - 149 mg/dL   HDL 35 (L) >39 mg/dL   VLDL Cholesterol Cal 26 5 - 40 mg/dL   LDL Chol Calc (NIH) 70 0 - 99 mg/dL   Chol/HDL Ratio 3.7 0.0 - 4.4 ratio  B12  Result Value Ref Range   Vitamin B-12 219 (L) 232 - 1,245 pg/mL  Vitamin D (25 hydroxy)  Result Value Ref Range   Vit D, 25-Hydroxy 33.7 30.0 - 100.0 ng/mL      Assessment & Plan:   Problem List Items Addressed This Visit       Cardiovascular and Mediastinum   Hypertension associated with diabetes (HCC)    Chronic.  Controlled.  Continue with current medication regimen of  Hydralazine 349m BID, Spirnolactone 278m Amlodipine 1056mand Atenolol 100m64mLabs ordered today.  Return to clinic in 3 months for reevaluation.  Call sooner if concerns arise.          Senile purpura (HCC)[place] Reassurance provided today.          Endocrine   DM type 2 with diabetic peripheral neuropathy (HCC)    Chronic.  Uncontrolled.  Last A1c was 8.9.  Labs ordered today.  Currently on Trulicity 4.49m5.4SFkly, Januvia 50mg549mrdiance 249mg.749mll make recommendations based on lab results.  Return to clinic in 3 months for reevaluation.  Call sooner if concerns arise.       Relevant Orders   HgB A1c   Hyperlipidemia associated with type 2 diabetes mellitus (HCC)    Chronic.  Controlled.  Continue with current medication regimen of Lovastatin 40mg d7m.  Labs ordered today.  Return to clinic in 3 months for reevaluation.  Call sooner if concerns arise.         Relevant Orders   Lipid panel     Genitourinary   CKD (chronic kidney disease) stage 3, GFR 30-59 ml/min (HCC)    Chronic.  Labs ordered today. Will make recommendations based on lab results. Will make medication adjustments as needed.          Other   Morbid obesity (HCC)   Meadcommend a healthy lifestyle through diet and exercise.       Vitamin D deficiency    Labs ordered today. Will make recommendations based on lab results.       Relevant Orders   Vitamin D (25 hydroxy)   Vitamin B12 deficiency    Labs ordered  today. Will make recommendations based on lab results.       Relevant Orders   B12   Other Visit Diagnoses     Annual physical exam    -  Primary   Health maintenance reviewed during visit today. Labs ordered.  HM up to date.   Relevant Orders   CBC with Differential/Platelet   Comprehensive metabolic panel   Lipid panel   TSH   Urinalysis, Routine w reflex microscopic   HgB A1c   B12   Vitamin D (25 hydroxy)        Follow up plan: Return in about 3 months (around 08/05/2022)  for HTN, HLD, DM2 FU.   LABORATORY TESTING:  - Pap smear: not applicable  IMMUNIZATIONS:   - Tdap: Tetanus vaccination status reviewed: last tetanus booster within 10 years. - Influenza: Postponed to flu season - Pneumovax: Up to date - Prevnar: Up to date - COVID: Up to date - HPV: Not applicable - Shingrix vaccine:  Discussed at visit today  SCREENING: -Mammogram: Up to date  - Colonoscopy: Up to date  - Bone Density: Up to date  -Hearing Test: Not applicable  -Spirometry: Not applicable   PATIENT COUNSELING:   Advised to take 1 mg of folate supplement per day if capable of pregnancy.   Sexuality: Discussed sexually transmitted diseases, partner selection, use of condoms, avoidance of unintended pregnancy  and contraceptive alternatives.   Advised to avoid cigarette smoking.  I discussed with the patient that most people either abstain from alcohol or drink within safe limits (<=14/week and <=4 drinks/occasion for males, <=7/weeks and <= 3 drinks/occasion for females) and that the risk for alcohol disorders and other health effects rises proportionally with the number of drinks per week and how often a drinker exceeds daily limits.  Discussed cessation/primary prevention of drug use and availability of treatment for abuse.   Diet: Encouraged to adjust caloric intake to maintain  or achieve ideal body weight, to reduce intake of dietary saturated fat and total fat, to limit sodium intake by avoiding high sodium foods and not adding table salt, and to maintain adequate dietary potassium and calcium preferably from fresh fruits, vegetables, and low-fat dairy products.    stressed the importance of regular exercise  Injury prevention: Discussed safety belts, safety helmets, smoke detector, smoking near bedding or upholstery.   Dental health: Discussed importance of regular tooth brushing, flossing, and dental visits.    NEXT PREVENTATIVE PHYSICAL DUE IN 1 YEAR. Return in  about 3 months (around 08/05/2022) for HTN, HLD, DM2 FU.

## 2022-05-05 NOTE — Assessment & Plan Note (Signed)
Chronic.  Controlled.  Continue with current medication regimen of Lovastatin 40mg daily.  Labs ordered today.  Return to clinic in 3 months for reevaluation.  Call sooner if concerns arise.   

## 2022-05-05 NOTE — Assessment & Plan Note (Signed)
Chronic.  Uncontrolled.  Last A1c was 8.9.  Labs ordered today.  Currently on Trulicity 4.'5mg'$  weekly, Januvia '50mg'$ , Jardiance '25mg'$ .  Will make recommendations based on lab results.  Return to clinic in 3 months for reevaluation.  Call sooner if concerns arise.

## 2022-05-05 NOTE — Assessment & Plan Note (Signed)
Labs ordered today.  Will make recommendations based on lab results. ?

## 2022-05-05 NOTE — Assessment & Plan Note (Signed)
Recommend a healthy lifestyle through diet and exercise.  °

## 2022-05-05 NOTE — Assessment & Plan Note (Signed)
Reassurance provided today.

## 2022-05-05 NOTE — Assessment & Plan Note (Signed)
Chronic.  Labs ordered today. Will make recommendations based on lab results. Will make medication adjustments as needed.

## 2022-05-06 LAB — HEMOGLOBIN A1C
Est. average glucose Bld gHb Est-mCnc: 220 mg/dL
Hgb A1c MFr Bld: 9.3 % — ABNORMAL HIGH (ref 4.8–5.6)

## 2022-05-06 LAB — TSH: TSH: 2.26 u[IU]/mL (ref 0.450–4.500)

## 2022-05-06 LAB — CBC WITH DIFFERENTIAL/PLATELET
Basophils Absolute: 0.1 10*3/uL (ref 0.0–0.2)
Basos: 1 %
EOS (ABSOLUTE): 0.1 10*3/uL (ref 0.0–0.4)
Eos: 1 %
Hematocrit: 44 % (ref 34.0–46.6)
Hemoglobin: 14.3 g/dL (ref 11.1–15.9)
Immature Grans (Abs): 0 10*3/uL (ref 0.0–0.1)
Immature Granulocytes: 0 %
Lymphocytes Absolute: 1.7 10*3/uL (ref 0.7–3.1)
Lymphs: 21 %
MCH: 25.9 pg — ABNORMAL LOW (ref 26.6–33.0)
MCHC: 32.5 g/dL (ref 31.5–35.7)
MCV: 80 fL (ref 79–97)
Monocytes Absolute: 0.7 10*3/uL (ref 0.1–0.9)
Monocytes: 9 %
Neutrophils Absolute: 5.5 10*3/uL (ref 1.4–7.0)
Neutrophils: 68 %
Platelets: 228 10*3/uL (ref 150–450)
RBC: 5.53 x10E6/uL — ABNORMAL HIGH (ref 3.77–5.28)
RDW: 13.8 % (ref 11.7–15.4)
WBC: 8.1 10*3/uL (ref 3.4–10.8)

## 2022-05-06 LAB — LIPID PANEL
Chol/HDL Ratio: 4.3 ratio (ref 0.0–4.4)
Cholesterol, Total: 170 mg/dL (ref 100–199)
HDL: 40 mg/dL (ref >39)
LDL Chol Calc (NIH): 105 mg/dL — ABNORMAL HIGH (ref 0–99)
Triglycerides: 139 mg/dL (ref 0–149)
VLDL Cholesterol Cal: 25 mg/dL (ref 5–40)

## 2022-05-06 LAB — COMPREHENSIVE METABOLIC PANEL
ALT: 19 IU/L (ref 0–32)
AST: 18 IU/L (ref 0–40)
Albumin/Globulin Ratio: 1.8 (ref 1.2–2.2)
Albumin: 4.3 g/dL (ref 3.7–4.7)
Alkaline Phosphatase: 88 IU/L (ref 44–121)
BUN/Creatinine Ratio: 12 (ref 12–28)
BUN: 11 mg/dL (ref 8–27)
Bilirubin Total: 0.3 mg/dL (ref 0.0–1.2)
CO2: 23 mmol/L (ref 20–29)
Calcium: 9.6 mg/dL (ref 8.7–10.3)
Chloride: 101 mmol/L (ref 96–106)
Creatinine, Ser: 0.93 mg/dL (ref 0.57–1.00)
Globulin, Total: 2.4 g/dL (ref 1.5–4.5)
Glucose: 99 mg/dL (ref 70–99)
Potassium: 4.1 mmol/L (ref 3.5–5.2)
Sodium: 140 mmol/L (ref 134–144)
Total Protein: 6.7 g/dL (ref 6.0–8.5)
eGFR: 64 mL/min/{1.73_m2} (ref >59)

## 2022-05-06 LAB — VITAMIN D 25 HYDROXY (VIT D DEFICIENCY, FRACTURES): Vit D, 25-Hydroxy: 24.7 ng/mL — ABNORMAL LOW (ref 30.0–100.0)

## 2022-05-06 LAB — VITAMIN B12: Vitamin B-12: 291 pg/mL (ref 232–1245)

## 2022-05-08 NOTE — Progress Notes (Signed)
Please let patient know that her lab work shows that her A1c increased from 8.9 to 9.3.  Please verify how many units of [place] and how many units of Humalog patient is using.  We will need to increase these doses.  I would like her to check her sugars and bring them in a log to her next appt.  I would like to see her in 1 month with her blood sugar log. Please move her appointment up.   Her vitamin D is low.  I recommend she take 1000IU of vitamin D daily. Otherwise her lab work looks good.  I will see her in 1 month.

## 2022-05-08 NOTE — Progress Notes (Signed)
Please have her increase the [place] to 50u daily.

## 2022-05-22 ENCOUNTER — Ambulatory Visit (INDEPENDENT_AMBULATORY_CARE_PROVIDER_SITE_OTHER): Payer: 59

## 2022-05-22 DIAGNOSIS — E1142 Type 2 diabetes mellitus with diabetic polyneuropathy: Secondary | ICD-10-CM

## 2022-05-22 DIAGNOSIS — E1159 Type 2 diabetes mellitus with other circulatory complications: Secondary | ICD-10-CM

## 2022-05-22 NOTE — Progress Notes (Signed)
Chronic Care Management Pharmacy Note  05/22/2022 Name:  Tami Cummings MRN:  [id number] DOB:  July 01, 1946  Summary: -has not been able to get glucose monitor working -taking trulicity 92m once weekly instead of 4.5 mg  -taking tresiba 40 units instead of 50 units -not taking d3 1000 units daily   Recommendations/Changes made from today's visit: -reviewed med changes above w/ patient - will implement intended changes and start testing BGs  Plan: -HC call one week to check on BG readings and ensure tresiba 50 units daily and trulcity 4.5 mg changes have been implemented - these were previous dose increases. Also pt was supposed to start on d3 1000 units daily  Subjective: Tami Labrecqueis an 76y.o. year old female who is a primary patient of Tami Billings NP.  The CCM team was consulted for assistance with disease management and care coordination needs.    Engaged with patient by telephone for follow up visit in response to provider referral for pharmacy case management and/or care coordination services.   Consent to Services:  The patient was given information about Chronic Care Management services, agreed to services, and gave verbal consent prior to initiation of services.  Please see initial visit note for detailed documentation.   Patient Care Team: Tami Billings NP as PCP - Tami Mustache MD (Ophthalmology) Tami Ingles RN as TMaldenManagement (General Practice)  Recent office visits: 5/19 PCP - inc tTyler Cummings 50 units daily, start on d3 1000 units daily   Hospital visits: None in previous 6 months  Objective:  Lab Results  Component Value Date   CREATININE 0.93 05/05/2022   CREATININE 0.91 02/03/2022   CREATININE 0.92 10/17/2021    Lab Results  Component Value Date   HGBA1C 9.3 (H) 05/05/2022   Last diabetic Eye exam:  Lab Results  Component Value Date/Time   HMDIABEYEEXA No Retinopathy  04/26/2021 12:00 AM    Last diabetic Foot exam: No results found for: HMDIABFOOTEX      Component Value Date/Time   CHOL 170 05/05/2022 1431   CHOL 137 12/04/2018 1106   TRIG 139 05/05/2022 1431   TRIG 262 (H) 12/04/2018 1106   HDL 40 05/05/2022 1431   CHOLHDL 4.3 05/05/2022 1431   VLDL 52 (H) 12/04/2018 1106   LDLCALC 105 (H) 05/05/2022 1431       Latest Ref Rng & Units 05/05/2022    2:31 PM 02/03/2022    2:07 PM 10/17/2021   10:39 AM  Hepatic Function  Total Protein 6.0 - 8.5 g/dL 6.7   6.6   6.7    Albumin 3.7 - 4.7 g/dL 4.3   4.4   4.2    AST 0 - 40 IU/L '18   16   14    ' ALT 0 - 32 IU/L '19   16   11    ' Alk Phosphatase 44 - 121 IU/L 88   94   88    Total Bilirubin 0.0 - 1.2 mg/dL 0.3   0.2   0.4      Lab Results  Component Value Date/Time   TSH 2.260 05/05/2022 02:31 PM   TSH 1.880 01/05/2021 11:14 AM       Latest Ref Rng & Units 05/05/2022    2:31 PM 10/17/2021   10:39 AM 01/05/2021   11:14 AM  CBC  WBC 3.4 - 10.8 x10E3/uL 8.1   8.9   9.6    Hemoglobin 11.1 -  15.9 g/dL 14.3   14.4   14.1    Hematocrit 34.0 - 46.6 % 44.0   44.7   42.7    Platelets 150 - 450 x10E3/uL 228   223   224      Lab Results  Component Value Date/Time   VD25OH 24.7 (L) 05/05/2022 02:31 PM   VD25OH 33.7 02/03/2022 02:07 PM    Clinical ASCVD:  The 10-year ASCVD risk score (Arnett DK, et al., 2019) is: 37.4%   Values used to calculate the score:     Age: 76 years     Sex: Female     Is Non-Hispanic African American: No     Diabetic: Yes     Tobacco smoker: No     Systolic Blood Pressure: 562 mmHg     Is BP treated: Yes     HDL Cholesterol: 40 mg/dL     Total Cholesterol: 170 mg/dL    Other: (CHADS2VASc if Afib, PHQ9 if depression, MMRC or CAT for COPD, ACT, DEXA)  Social History   Tobacco Use  Smoking Status Former   Packs/day: 1.00   Years: 20.00   Pack years: 20.00   Types: Cigarettes   Quit date: 08/26/2013   Years since quitting: 8.7  Smokeless Tobacco Never   BP  Readings from Last 3 Encounters:  05/05/22 138/74  02/03/22 123/69  10/17/21 133/73   Pulse Readings from Last 3 Encounters:  05/05/22 97  02/03/22 90  10/17/21 84   Wt Readings from Last 3 Encounters:  05/05/22 192 lb 6.4 oz (87.3 kg)  02/03/22 196 lb (88.9 kg)  10/17/21 196 lb (88.9 kg)    Assessment: Review of patient past medical history, allergies, medications, health status, including review of consultants reports, laboratory and other test data, was performed as part of comprehensive evaluation and provision of chronic care management services.   SDOH:  (Social Determinants of Health) assessments and interventions performed: Yes SDOH Interventions    Flowsheet Row Most Recent Value  SDOH Interventions   Food Insecurity Interventions Intervention Not Indicated  Transportation Interventions Intervention Not Indicated           CCM Care Plan  Allergies  Allergen Reactions   Actos [Pioglitazone] Other (See Comments)    CHF   Amlodipine Swelling    Leg swelling    Benazepril Other (See Comments)    angioedema   Hydrochlorothiazide Other (See Comments)    angioedema   Metformin And Related Diarrhea    Medications Reviewed Today     Reviewed by Tami Billings, NP (Nurse Practitioner) on 05/05/22 at [place] List Status: <None>   Medication Order Taking? Sig Documenting Provider Last Dose Status Informant  amLODipine (NORVASC) 10 MG tablet [id number] Yes Take 1 tablet (10 mg total) by mouth daily. Tami Cummings T, NP Taking Active   atenolol (TENORMIN) 100 MG tablet [id number] Yes Take 1 tablet (100 mg total) by mouth daily. Tami Cummings T, NP Taking Active   BD PEN NEEDLE MICRO U/F 32G X 6 MM MISC [id number] Yes USE 4 TO 5 TIMES DAILY WITH INSULIN USE Tami Cummings, Tami P, DO Taking Active   Blood Glucose Monitoring Suppl (ONETOUCH VERIO) w/Device KIT [id number] Yes Use to check blood sugar 2-3 times daily and document, bring results to provider visits.  Tami Billings, NP Taking Active   Dulaglutide (TRULICITY) 4.5 IW/8.0HO SOPN [id number] Yes Inject 4.5 mg as directed once a week. Tami Billings, NP Taking Active   glucose blood test strip  [id number] Yes Use to check blood sugar 2-3 times daily and document, bring results to provider visits. Tami Billings, NP Taking Active   hydrALAZINE (APRESOLINE) 10 MG tablet [id number] Yes Take 1 tablet (10 mg total) by mouth 2 (two) times daily. Tami Cummings T, NP Taking Active   insulin lispro (HUMALOG) 100 UNIT/ML KwikPen [id number] Yes INJECT 25 UNITS SUBCUTANEOUSLY TWICE DAILY BEFORE A MEAL Tami Billings, NP Taking Active   JARDIANCE 25 MG TABS tablet [id number] Yes TAKE 1 TABLET BY MOUTH ONCE DAILY BEFORE Venancio Poisson, NP Taking Active   Lancets [facility] ULTRASOFT) lancets [id number] Yes Use to check blood sugar 2-3 times daily and document, bring results to provider visits. Tami Billings, NP Taking Active   lovastatin (MEVACOR) 40 MG tablet [id number] Yes Take 1 tablet (40 mg total) by mouth daily with breakfast. Venita Lick, NP Taking Active   OneTouch Delica Lancets 27P MISC [id number] Yes 1 applicator by Does not apply route in the morning and at bedtime. Tami Billings, NP Taking Active   spironolactone (ALDACTONE) 25 MG tablet [id number] Yes Take 1 tablet by mouth once daily Tami Billings, NP Taking Active   TRESIBA FLEXTOUCH 100 UNIT/ML FlexTouch Pen [id number] Yes INJECT 28 UNITS SUBCUTANEOUSLY ONCE DAILY Tami Billings, NP Taking Active             Patient Active Problem List   Diagnosis Date Noted   Osteopenia 01/01/2021   Vitamin D deficiency 10/18/2020   Vitamin B12 deficiency 10/18/2020   CKD (chronic kidney disease) stage 3, GFR 30-59 ml/min ([place]) 10/08/2020   Senile purpura ([place]) 04/10/2017   Morbid obesity ([place]) 04/10/2017   Advance care planning 04/10/2017   Noncompliance 10/30/2016   DM type 2 with diabetic peripheral neuropathy  ([place])    Hyperlipidemia associated with type 2 diabetes mellitus ([place])    Hypertension associated with diabetes ([place])     Immunization History  Administered Date(s) Administered   Fluad Quad(high Dose 65+) 09/18/2020, 08/19/2021   Influenza, High Dose Seasonal PF 10/30/2016, 10/17/2017, 09/03/2018   Influenza,inj,Quad PF,6+ Mos 09/27/2015   Influenza-Unspecified 08/18/2014, 10/17/2017, 10/12/2019   PFIZER(Purple Top)SARS-COV-2 Vaccination 03/23/2020, 04/13/2020, 12/30/2020   Pneumococcal Conjugate-13 12/28/2014   Pneumococcal Polysaccharide-23 02/03/2022   Pneumococcal-Unspecified 12/30/2012   Td 05/23/2005, 03/14/2016   Zoster, Live 03/11/2014    Conditions to be addressed/monitored: HTN, HLD, DMII, and CKD Stage 3  Care Plan : [place]  Updates made by Madelin Rear, [facility] since 05/29/2022 12:00 AM     Problem: DM2, HTN, HLD, Osteopenia, CKD,   Priority: High     Long-Range Goal: Disease Management   Start Date: 05/22/2022  This Visit's Progress: On track  Recent Progress: On track  Priority: High  Note:   Current Barriers:  Unable to achieve control of DM   Pharmacist Clinical Goal(s):  Patient will verbalize ability to afford treatment regimen achieve adherence to monitoring guidelines and medication adherence to achieve therapeutic efficacy through collaboration with PharmD and provider.   Interventions: 1:1 collaboration with Tami Billings, NP regarding development and update of comprehensive plan of care as evidenced by provider attestation and co-signature Inter-disciplinary care team collaboration (see longitudinal plan of care) Comprehensive medication review performed; medication list updated in electronic medical record  Hypertension (BP goal <130/80) -Not ideally controlled -Current treatment: Hydralazine 10 mg twice daily Appropriate, Effective, Safe, Accessible Atenolol 100 mg once daily Appropriate, Effective, Safe,  Accessible Spironolactone 25 mg once daily Appropriate, Effective, Safe, Accessible Amlodipine 10 mg once daily  Appropriate, Effective, Safe, Accessible -Medications previously tried: benazepril (angioedema), HCTZ (angioedema)  -Current home readings:  -Current dietary habits:  -Current exercise habits:  -Denies hypotensive/hypertensive symptoms -Educated on BP goals and benefits of medications for prevention of heart attack, stroke and kidney damage; -Counseled to monitor BP at home 1-2x/wk, document, and provide log at future appointments -Counseled on diet and exercise extensively Recommended to continue current medication, near bp goal of 130/80, not action at this time.  Diabetes (A1c goal <7%) -Uncontrolled -Current medications: Humalog 25 units twice daily Appropriate, Query Effective Tresiba 50 units once daily  Appropriate, Query Effective Humalog 25 units twice daily  Appropriate, Query Effective Trulicity 4.5 mg once weekly on Wednesday  Appropriate, Query Effective Jaridance 25 mg once daily  Appropriate, Query Effective -Medications previously tried: metformin (GI issues), actos, Januvia (trulicity transition), Onglyza  -Current home glucose readings fasting glucose: not testing post prandial glucose: not testing -Denies hypoglycemic/hyperglycemic symptoms -Current meal patterns: mashed potatoes, beans, fried chicken. Some sweets. Sliced ham. No greens. Some fruits, strawberries, apples, bananas. Bowl of cereal for breakfast. Diet soda (1 big bottle per day b/w her and grand dtr), no tea. -Current exercise: walks with grand dtr daily  -Educated on A1c and blood sugar goals; Prevention and management of hypoglycemic episodes; -Counseled to check feet daily and get yearly eye exams -Counseled on diet and exercise extensively       Patient Goals/Self-Care Activities Patient will:  - take medications as prescribed as evidenced by patient report and record  review target a minimum of 150 minutes of moderate intensity exercise weekly   Medication Assistance: None required.  Patient affirms current coverage meets needs.  Patient's preferred pharmacy is:  [facility] [address], [place] [address] [place] [address] Phone: [phone] Fax: [phone]  [facility] ([facility] ) - [place], [place] [address] [place] [address] Phone: [phone] Fax: [phone]  Follow Up:  Patient agrees to Care Plan and Follow-up.  Plan:  hc call 1 week. CP telephone visit 1 month.  Future Appointments  Date Time Provider [place]  06/09/2022 10:30 AM CFP CCM CASE MANAGER CFP-CFP PEC  06/12/2022  9:45 AM CFP NURSE HEALTH ADVISOR CFP-CFP PEC  07/05/2022  9:00 AM MCM MM DEXA MCM-MM [facility]  07/10/2022  2:00 PM CFP CCM PHARMACY CFP-CFP PEC  08/07/2022  3:00 PM Tami Billings, NP CFP-CFP PEC   Madelin Rear, PharmD, [facility] Clinical Pharmacist  [facility]  [phone]

## 2022-05-22 NOTE — Patient Instructions (Addendum)
Ms. Mesina,  Thank you for talking with me today. I have included our care plan/goals in the following pages.   Please review and call me at [phone] with any questions.  Thanks! Ellin Mayhew, PharmD Clinical Pharmacist  [phone]  Care Plan : [place]  Updates made by Madelin Rear, [facility] since 05/29/2022 12:00 AM     Problem: DM2, HTN, HLD, Osteopenia, CKD,   Priority: High     Long-Range Goal: Disease Management   Start Date: 05/22/2022  This Visit's Progress: On track  Recent Progress: On track  Priority: High  Note:   Current Barriers:  Unable to achieve control of DM   Pharmacist Clinical Goal(s):  Patient will verbalize ability to afford treatment regimen achieve adherence to monitoring guidelines and medication adherence to achieve therapeutic efficacy through collaboration with PharmD and provider.   Interventions: 1:1 collaboration with Jon Billings, NP regarding development and update of comprehensive plan of care as evidenced by provider attestation and co-signature Inter-disciplinary care team collaboration (see longitudinal plan of care) Comprehensive medication review performed; medication list updated in electronic medical record  Hypertension (BP goal <130/80) -Not ideally controlled -Current treatment: Hydralazine 10 mg twice daily Appropriate, Effective, Safe, Accessible Atenolol 100 mg once daily Appropriate, Effective, Safe, Accessible Spironolactone 25 mg once daily Appropriate, Effective, Safe, Accessible Amlodipine 10 mg once daily  Appropriate, Effective, Safe, Accessible -Medications previously tried: benazepril (angioedema), HCTZ (angioedema)  -Current home readings:  -Current dietary habits:  -Current exercise habits:  -Denies hypotensive/hypertensive symptoms -Educated on BP goals and benefits of medications for prevention of heart attack, stroke and kidney damage; -Counseled to monitor BP at home  1-2x/wk, document, and provide log at future appointments -Counseled on diet and exercise extensively Recommended to continue current medication, near bp goal of 130/80, not action at this time.  Diabetes (A1c goal <7%) -Uncontrolled -Current medications: Humalog 25 units twice daily Appropriate, Query Effective Tresiba 50 units once daily  Appropriate, Query Effective Humalog 25 units twice daily  Appropriate, Query Effective Trulicity 4.5 mg once weekly on Wednesday  Appropriate, Query Effective Jaridance 25 mg once daily  Appropriate, Query Effective -Medications previously tried: metformin (GI issues), actos, Januvia (trulicity transition), Onglyza  -Current home glucose readings fasting glucose: not testing post prandial glucose: not testing -Denies hypoglycemic/hyperglycemic symptoms -Current meal patterns: mashed potatoes, beans, fried chicken. Some sweets. Sliced ham. No greens. Some fruits, strawberries, apples, bananas. Bowl of cereal for breakfast. Diet soda (1 big bottle per day b/w her and grand dtr), no tea. -Current exercise: walks with grand dtr daily  -Educated on A1c and blood sugar goals; Prevention and management of hypoglycemic episodes; -Counseled to check feet daily and get yearly eye exams -Counseled on diet and exercise extensively     The patient verbalized understanding of instructions provided today and agreed to receive a MyChart copy of patient instruction and/or educational materials. Telephone follow up appointment with pharmacy team member scheduled for: See next appointment with "Care Management Staff" under "What's Next" below.

## 2022-05-23 ENCOUNTER — Telehealth: Payer: Self-pay | Admitting: Nurse Practitioner

## 2022-05-23 DIAGNOSIS — E1142 Type 2 diabetes mellitus with diabetic polyneuropathy: Secondary | ICD-10-CM

## 2022-05-23 MED ORDER — ONETOUCH DELICA LANCETS 33G MISC: 1.0000 | Freq: Two times a day (BID) | 1 refills | Status: AC

## 2022-05-23 MED ORDER — GLUCOSE BLOOD VI STRP: ORAL_STRIP | 12 refills | Status: AC

## 2022-05-23 MED ORDER — ONETOUCH ULTRASOFT LANCETS MISC: 12 refills | Status: DC

## 2022-05-23 NOTE — Telephone Encounter (Signed)
-----   Message from Madelin Rear, [facility] sent at 05/23/2022  4:20 PM EDT ----- Santiago Glad would you be able to send the onetouch meter and strips? Pt said she has not been able to get meter working. Its been over a yr since meter issued so should be covered.

## 2022-05-24 ENCOUNTER — Other Ambulatory Visit: Payer: Self-pay | Admitting: Nurse Practitioner

## 2022-05-24 DIAGNOSIS — E1159 Type 2 diabetes mellitus with other circulatory complications: Secondary | ICD-10-CM

## 2022-05-24 NOTE — Telephone Encounter (Signed)
Requested medication (s) are due for refill today:   Yes  Requested medication (s) are on the active medication list:   Yes  Future visit scheduled:   Yes   Last ordered: both for 05/01/2022 #90, 1 refill  Returned because mail order pharmacy requesting a 1 yr supply   Requested Prescriptions  Pending Prescriptions Disp Refills   spironolactone (ALDACTONE) 25 MG tablet [Pharmacy Med Name: Spironolactone 25 MG Oral Tablet] 90 tablet 3    Sig: TAKE 1 TABLET BY MOUTH DAILY     Cardiovascular: Diuretics - Aldosterone Antagonist Passed - 05/24/2022 10:30 AM      Passed - Cr in normal range and within 180 days    Creatinine, Ser  Date Value Ref Range Status  05/05/2022 0.93 0.57 - 1.00 mg/dL Final         Passed - K in normal range and within 180 days    Potassium  Date Value Ref Range Status  05/05/2022 4.1 3.5 - 5.2 mmol/L Final         Passed - Na in normal range and within 180 days    Sodium  Date Value Ref Range Status  05/05/2022 140 134 - 144 mmol/L Final         Passed - eGFR is 30 or above and within 180 days    GFR calc Af Amer  Date Value Ref Range Status  01/05/2021 72 >59 mL/min/1.73 Final    Comment:    **In accordance with recommendations from the NKF-ASN Task force,**   [facility] is in the process of updating its eGFR calculation to the   2021 CKD-EPI creatinine equation that estimates kidney function   without a race variable.    GFR calc non Af Amer  Date Value Ref Range Status  01/05/2021 63 >59 mL/min/1.73 Final   eGFR  Date Value Ref Range Status  05/05/2022 64 >59 mL/min/1.73 Final         Passed - Last BP in normal range    BP Readings from Last 1 Encounters:  05/05/22 138/74         Passed - Valid encounter within last 6 months    Recent Outpatient Visits           2 weeks ago Annual physical exam   [facility] Jon Billings, NP   3 months ago Hypertension associated with diabetes ([place])   [facility]  Jon Billings, NP   7 months ago Hypertension associated with diabetes ([place])   [facility] Jon Billings, NP   9 months ago Hypertension associated with diabetes [facility])   [facility] Jon Billings, NP   10 months ago Hypertension associated with diabetes ([place])   [place], Karen, NP       Future Appointments             In 2 weeks  [facility], [place]   In 2 months Jon Billings, NP [place], PEC              JARDIANCE 25 MG TABS tablet [Pharmacy Med Name: Jardiance 25 MG Oral Tablet] 90 tablet 3    Sig: TAKE 1 [place]     Endocrinology:  Diabetes - SGLT2 Inhibitors Failed - 05/24/2022 10:30 AM      Failed - HBA1C is between 0 and 7.9 and within 180 days    Hemoglobin A1C  Date Value Ref Range Status  07/13/2016 7.9  Final   HB A1C (BAYER DCA - WAIVED)  Date Value Ref Range Status  01/05/2021 9.4 (H) <7.0 % Final    Comment:                                          Diabetic Adult            <7.0                                       Healthy Adult        4.3 - 5.7                                                           (DCCT/NGSP) American Diabetes Association's Summary of Glycemic Recommendations for Adults with Diabetes: Hemoglobin A1c <7.0%. More stringent glycemic goals (A1c <6.0%) may further reduce complications at the cost of increased risk of hypoglycemia.    Hgb A1c MFr Bld  Date Value Ref Range Status  05/05/2022 9.3 (H) 4.8 - 5.6 % Final    Comment:             Prediabetes: 5.7 - 6.4          Diabetes: >6.4          Glycemic control for adults with diabetes: <7.0          Passed - Cr in normal range and within 360 days    Creatinine, Ser  Date Value Ref Range Status  05/05/2022 0.93 0.57 - 1.00 mg/dL Final         Passed - eGFR in normal range and within 360 days    GFR calc Af Amer  Date Value Ref Range Status   01/05/2021 72 >59 mL/min/1.73 Final    Comment:    **In accordance with recommendations from the NKF-ASN Task force,**   [facility] is in the process of updating its eGFR calculation to the   2021 CKD-EPI creatinine equation that estimates kidney function   without a race variable.    GFR calc non Af Amer  Date Value Ref Range Status  01/05/2021 63 >59 mL/min/1.73 Final   eGFR  Date Value Ref Range Status  05/05/2022 64 >59 mL/min/1.73 Final         Passed - Valid encounter within last 6 months    Recent Outpatient Visits           2 weeks ago Annual physical exam   [facility] Jon Billings, NP   3 months ago Hypertension associated with diabetes [facility])   [facility] Jon Billings, NP   7 months ago Hypertension associated with diabetes [facility])   [facility] Jon Billings, NP   9 months ago Hypertension associated with diabetes [facility])   [facility] Jon Billings, NP   10 months ago Hypertension associated with diabetes [facility])   [place], Karen, NP       Future Appointments             In 2 weeks  [facility], P[facility]   In  2 months Jon Billings, NP [facility], PEC

## 2022-05-26 ENCOUNTER — Other Ambulatory Visit: Payer: Self-pay | Admitting: Nurse Practitioner

## 2022-05-26 DIAGNOSIS — Z1231 Encounter for screening mammogram for malignant neoplasm of breast: Secondary | ICD-10-CM

## 2022-05-26 DIAGNOSIS — I152 Hypertension secondary to endocrine disorders: Secondary | ICD-10-CM

## 2022-05-28 ENCOUNTER — Other Ambulatory Visit: Payer: Self-pay | Admitting: Nurse Practitioner

## 2022-05-28 DIAGNOSIS — E7800 Pure hypercholesterolemia, unspecified: Secondary | ICD-10-CM

## 2022-05-29 NOTE — Telephone Encounter (Signed)
Change of pharmacy Requested Prescriptions  Pending Prescriptions Disp Refills  . spironolactone (ALDACTONE) 25 MG tablet [Pharmacy Med Name: Spironolactone 25 MG Oral Tablet] 90 tablet 1    Sig: TAKE 1 TABLET BY MOUTH DAILY     Cardiovascular: Diuretics - Aldosterone Antagonist Passed - 05/26/2022  8:36 PM      Passed - Cr in normal range and within 180 days    Creatinine, Ser  Date Value Ref Range Status  05/05/2022 0.93 0.57 - 1.00 mg/dL Final         Passed - K in normal range and within 180 days    Potassium  Date Value Ref Range Status  05/05/2022 4.1 3.5 - 5.2 mmol/L Final         Passed - Na in normal range and within 180 days    Sodium  Date Value Ref Range Status  05/05/2022 140 134 - 144 mmol/L Final         Passed - eGFR is 30 or above and within 180 days    GFR calc Af Amer  Date Value Ref Range Status  01/05/2021 72 >59 mL/min/1.73 Final    Comment:    **In accordance with recommendations from the NKF-ASN Task force,**   [facility] is in the process of updating its eGFR calculation to the   2021 CKD-EPI creatinine equation that estimates kidney function   without a race variable.    GFR calc non Af Amer  Date Value Ref Range Status  01/05/2021 63 >59 mL/min/1.73 Final   eGFR  Date Value Ref Range Status  05/05/2022 64 >59 mL/min/1.73 Final         Passed - Last BP in normal range    BP Readings from Last 1 Encounters:  05/05/22 138/74         Passed - Valid encounter within last 6 months    Recent Outpatient Visits          3 weeks ago Annual physical exam   [facility]ings, NP   3 months ago Hypertension associated with diabetes [facility])   [facility] Jon Billings, NP   7 months ago Hypertension associated with diabetes [facility])   [facility] Jon Billings, NP   9 months ago Hypertension associated with diabetes [facility])   [facility] Jon Billings, NP   10 months ago  Hypertension associated with diabetes [facility])   [place], Karen, NP      Future Appointments            In 2 weeks  [facility], [place]   In 2 months Jon Billings, NP [facility], [place]           . JARDIANCE 25 MG TABS tablet [Pharmacy Med Name: Jardiance 25 MG Oral Tablet] 90 tablet 0    Sig: TAKE 1 TABLET BY MOUTH DAILY  BEFORE BREAKFAST     Endocrinology:  Diabetes - SGLT2 Inhibitors Failed - 05/26/2022  8:36 PM      Failed - HBA1C is between 0 and 7.9 and within 180 days    Hemoglobin A1C  Date Value Ref Range Status  07/13/2016 7.9  Final   HB A1C (BAYER DCA - WAIVED)  Date Value Ref Range Status  01/05/2021 9.4 (H) <7.0 % Final    Comment:  Diabetic Adult            <7.0                                       Healthy Adult        4.3 - 5.7                                                           (DCCT/NGSP) American Diabetes Association's Summary of Glycemic Recommendations for Adults with Diabetes: Hemoglobin A1c <7.0%. More stringent glycemic goals (A1c <6.0%) may further reduce complications at the cost of increased risk of hypoglycemia.    Hgb A1c MFr Bld  Date Value Ref Range Status  05/05/2022 9.3 (H) 4.8 - 5.6 % Final    Comment:             Prediabetes: 5.7 - 6.4          Diabetes: >6.4          Glycemic control for adults with diabetes: <7.0          Passed - Cr in normal range and within 360 days    Creatinine, Ser  Date Value Ref Range Status  05/05/2022 0.93 0.57 - 1.00 mg/dL Final         Passed - eGFR in normal range and within 360 days    GFR calc Af Amer  Date Value Ref Range Status  01/05/2021 72 >59 mL/min/1.73 Final    Comment:    **In accordance with recommendations from the NKF-ASN Task force,**   [facility] is in the process of updating its eGFR calculation to the   2021 CKD-EPI creatinine equation that estimates kidney function   without a race  variable.    GFR calc non Af Amer  Date Value Ref Range Status  01/05/2021 63 >59 mL/min/1.73 Final   eGFR  Date Value Ref Range Status  05/05/2022 64 >59 mL/min/1.73 Final         Passed - Valid encounter within last 6 months    Recent Outpatient Visits          3 weeks ago Annual physical exam   [facility], NP   3 months ago Hypertension associated with diabetes [facility])   [facility]   7 months ago Hypertension associated with diabetes [facility])   [facility] Jon Billings, NP   9 months ago Hypertension associated with diabetes [facility])   [facility] Jon Billings, NP   10 months ago Hypertension associated with diabetes [facility])   [place], Karen, NP      Future Appointments            In 2 weeks  [facility], [place]   In 2 months Jon Billings, NP [facility], [place]

## 2022-05-30 ENCOUNTER — Other Ambulatory Visit: Payer: Self-pay | Admitting: Nurse Practitioner

## 2022-05-30 DIAGNOSIS — E7800 Pure hypercholesterolemia, unspecified: Secondary | ICD-10-CM

## 2022-05-30 NOTE — Telephone Encounter (Signed)
Requested Prescriptions  Pending Prescriptions Disp Refills  . lovastatin (MEVACOR) 40 MG tablet [Pharmacy Med Name: Lovastatin 40 MG Oral Tablet] 100 tablet 2    Sig: TAKE 1 TABLET BY MOUTH DAILY  WITH BREAKFAST     Cardiovascular:  Antilipid - Statins 2 Failed - 05/28/2022  8:22 PM      Failed - Lipid Panel in normal range within the last 12 months    Cholesterol, Total  Date Value Ref Range Status  05/05/2022 170 100 - 199 mg/dL Final   Cholesterol Piccolo, Waived  Date Value Ref Range Status  12/04/2018 137 <200 mg/dL Final    Comment:                            Desirable                <200                         Borderline High      200- 239                         High                     >239    LDL Chol Calc (NIH)  Date Value Ref Range Status  05/05/2022 105 (H) 0 - 99 mg/dL Final   HDL  Date Value Ref Range Status  05/05/2022 40 >39 mg/dL Final   Triglycerides  Date Value Ref Range Status  05/05/2022 139 0 - 149 mg/dL Final   Triglycerides Piccolo,Waived  Date Value Ref Range Status  12/04/2018 262 (H) <150 mg/dL Final    Comment:                            Normal                   <150                         Borderline High     150 - 199                         High                200 - 499                         Very High                >499          Passed - Cr in normal range and within 360 days    Creatinine, Ser  Date Value Ref Range Status  05/05/2022 0.93 0.57 - 1.00 mg/dL Final         Passed - Patient is not pregnant      Passed - Valid encounter within last 12 months    Recent Outpatient Visits          3 weeks ago Annual physical exam   [facility], NP   3 months ago Hypertension associated with diabetes [facility])   [facility] Jon [facility], NP   7 months ago Hypertension associated with diabetes ([place])   [facility] Jon Billings, NP   9 months ago Hypertension  associated with diabetes ([place])   [facility] Jon Billings, NP   10 months ago Hypertension associated with diabetes ([place])   [facility] Jon Billings, NP      Future Appointments            In 1 week  [facility], [place]   In 2 months Jon Billings, NP [facility]           . hydrALAZINE (APRESOLINE) 10 MG tablet [Pharmacy Med Name: hydrALAZINE HCl 10 MG Oral Tablet] 200 tablet 2    Sig: TAKE 1 TABLET BY MOUTH TWICE  DAILY     Cardiovascular:  Vasodilators Failed - 05/28/2022  8:22 PM      Failed - RBC in normal range and within 360 days    RBC  Date Value Ref Range Status  05/05/2022 5.53 (H) 3.77 - 5.28 x10E6/uL Final         Failed - ANA Screen, Ifa, Serum in normal range and within 360 days    No results found for: "ANA", "ANATITER", "LABANTI"       Passed - HCT in normal range and within 360 days    Hematocrit  Date Value Ref Range Status  05/05/2022 44.0 34.0 - 46.6 % Final         Passed - HGB in normal range and within 360 days    Hemoglobin  Date Value Ref Range Status  05/05/2022 14.3 11.1 - 15.9 g/dL Final         Passed - WBC in normal range and within 360 days    WBC  Date Value Ref Range Status  05/05/2022 8.1 3.4 - 10.8 x10E3/uL Final         Passed - PLT in normal range and within 360 days    Platelets  Date Value Ref Range Status  05/05/2022 228 150 - 450 x10E3/uL Final         Passed - Last BP in normal range    BP Readings from Last 1 Encounters:  05/05/22 138/74         Passed - Valid encounter within last 12 months    Recent Outpatient Visits          3 weeks ago Annual physical exam   [facility], NP   3 months ago Hypertension associated with diabetes ([place])   [facility], NP   7 months ago Hypertension associated with [facility])   [facility], NP   9 months ago  Hypertension associated with diabetes [facility])   [facility] Jon Billings, NP   10 months ago Hypertension associated with diabetes [facility])   [place], Karen, NP      Future Appointments            In 1 week  [facility], [place]   In 2 months Jon Billings, NP [facility], [place]

## 2022-05-31 NOTE — Telephone Encounter (Signed)
Receipt confirmed by pharmacy on both meds 05/30/22  at 8:27. Requested Prescriptions  Pending Prescriptions Disp Refills  . lovastatin (MEVACOR) 40 MG tablet [Pharmacy Med Name: Lovastatin 40 MG Oral Tablet] 100 tablet 2    Sig: TAKE 1 TABLET BY MOUTH DAILY  WITH BREAKFAST     Cardiovascular:  Antilipid - Statins 2 Failed - 05/30/2022 10:27 PM      Failed - Lipid Panel in normal range within the last 12 months    Cholesterol, Total  Date Value Ref Range Status  05/05/2022 170 100 - 199 mg/dL Final   Cholesterol Piccolo, Waived  Date Value Ref Range Status  12/04/2018 137 <200 mg/dL Final    Comment:                            Desirable                <200                         Borderline High      200- 239                         High                     >239    LDL Chol Calc (NIH)  Date Value Ref Range Status  05/05/2022 105 (H) 0 - 99 mg/dL Final   HDL  Date Value Ref Range Status  05/05/2022 40 >39 mg/dL Final   Triglycerides  Date Value Ref Range Status  05/05/2022 139 0 - 149 mg/dL Final   Triglycerides Piccolo,Waived  Date Value Ref Range Status  12/04/2018 262 (H) <150 mg/dL Final    Comment:                            Normal                   <150                         Borderline High     150 - 199                         High                200 - 499                         Very High                >499          Passed - Cr in normal range and within 360 days    Creatinine, Ser  Date Value Ref Range Status  05/05/2022 0.93 0.57 - 1.00 mg/dL Final         Passed - Patient is not pregnant      Passed - Valid encounter within last 12 months    Recent Outpatient Visits          3 weeks ago Annual physical exam   [facility], NP   3 months ago Hypertension associated with diabetes [facility])   [place], NP   7 months  ago Hypertension associated with diabetes ([place])   [facility] Jon Bill[facility], NP   9 months ago Hypertension associated with diabetes ([place])   [facility] Jon Billings, NP   10 months ago Hypertension associated with diabetes ([place])   [facility] Jon Billings, NP      Future Appointments            In 1 week  [facility] [place]   In 2 months Jon Billings, NP [facility]           . hydrALAZINE (APRESOLINE) 10 MG tablet [Pharmacy Med Name: hydrALAZINE HCl 10 MG Oral Tablet] 200 tablet 2    Sig: TAKE 1 TABLET BY MOUTH TWICE  DAILY     Cardiovascular:  Vasodilators Failed - 05/30/2022 10:27 PM      Failed - RBC in normal range and within 360 days    RBC  Date Value Ref Range Status  05/05/2022 5.53 (H) 3.77 - 5.28 x10E6/uL Final         Failed - ANA Screen, Ifa, Serum in normal range and within 360 days    No results found for: "ANA", "ANATITER", "LABANTI"       Passed - HCT in normal range and within 360 days    Hematocrit  Date Value Ref Range Status  05/05/2022 44.0 34.0 - 46.6 % Final         Passed - HGB in normal range and within 360 days    Hemoglobin  Date Value Ref Range Status  05/05/2022 14.3 11.1 - 15.9 g/dL Final         Passed - WBC in normal range and within 360 days    WBC  Date Value Ref Range Status  05/05/2022 8.1 3.4 - 10.8 x10E3/uL Final         Passed - PLT in normal range and within 360 days    Platelets  Date Value Ref Range Status  05/05/2022 228 150 - 450 x10E3/uL Final         Passed - Last BP in normal range    BP Readings from Last 1 Encounters:  05/05/22 138/74         Passed - Valid encounter within last 12 months    Recent Outpatient Visits          3 weeks ago Annual physical exam   [place], NP   3 months ago Hypertension associated with diabetes ([place])   [facility] Jon Billings, NP   7 months ago Hypertension associated with diabetes [facility])   [facility] Bill[facility], NP   9 months ago Hypertension associated with diabetes [facility])   [facility] Jon Billings, NP   10 months ago Hypertension associated with diabetes [facility])   [place], Karen, NP      Future Appointments            In 1 week  [facility], [place]   In 2 months [facility], [facility], [place]

## 2022-06-05 ENCOUNTER — Ambulatory Visit: Payer: Medicare Other

## 2022-06-09 ENCOUNTER — Ambulatory Visit: Payer: Self-pay

## 2022-06-09 ENCOUNTER — Telehealth: Payer: Medicare Other

## 2022-06-09 DIAGNOSIS — I152 Hypertension secondary to endocrine disorders: Secondary | ICD-10-CM

## 2022-06-09 DIAGNOSIS — E1142 Type 2 diabetes mellitus with diabetic polyneuropathy: Secondary | ICD-10-CM

## 2022-06-09 DIAGNOSIS — G8929 Other chronic pain: Secondary | ICD-10-CM

## 2022-06-09 DIAGNOSIS — E1169 Type 2 diabetes mellitus with other specified complication: Secondary | ICD-10-CM

## 2022-06-09 DIAGNOSIS — I10 Essential (primary) hypertension: Secondary | ICD-10-CM

## 2022-06-12 ENCOUNTER — Ambulatory Visit: Payer: 59

## 2022-06-12 ENCOUNTER — Other Ambulatory Visit: Payer: Self-pay

## 2022-06-12 ENCOUNTER — Telehealth: Payer: Self-pay | Admitting: Nurse Practitioner

## 2022-06-12 DIAGNOSIS — E1142 Type 2 diabetes mellitus with diabetic polyneuropathy: Secondary | ICD-10-CM

## 2022-06-12 MED ORDER — ONETOUCH VERIO W/DEVICE KIT: PACK | 0 refills | Status: AC

## 2022-06-12 MED ORDER — ONETOUCH ULTRASOFT LANCETS MISC: 12 refills | Status: AC

## 2022-06-16 DIAGNOSIS — Z7984 Long term (current) use of oral hypoglycemic drugs: Secondary | ICD-10-CM

## 2022-06-16 DIAGNOSIS — E785 Hyperlipidemia, unspecified: Secondary | ICD-10-CM

## 2022-06-16 DIAGNOSIS — E1159 Type 2 diabetes mellitus with other circulatory complications: Secondary | ICD-10-CM

## 2022-06-16 DIAGNOSIS — I10 Essential (primary) hypertension: Secondary | ICD-10-CM

## 2022-06-22 ENCOUNTER — Ambulatory Visit: Payer: 59

## 2022-06-22 NOTE — Progress Notes (Unsigned)
Errounous encounter

## 2022-07-03 ENCOUNTER — Other Ambulatory Visit: Payer: Self-pay | Admitting: Nurse Practitioner

## 2022-07-03 NOTE — Telephone Encounter (Unsigned)
Copied from [place] [id number]. Topic: General - Other >> Jul 03, 2022 10:16 AM Everette C wrote: Reason for CRM: Medication Refill - Medication: insulin lispro (HUMALOG) 100 UNIT/ML KwikPen [[id number]]   Has the patient contacted their pharmacy? Yes.  The patient has been directed to contact their PCP (Agent: If no, request that the patient contact the pharmacy for the refill. If patient does not wish to contact the pharmacy document the reason why and proceed with request.) (Agent: If yes, when and what did the pharmacy advise?)  Preferred Pharmacy (with phone number or street name): [place] [address] [place] - [address] [place] [address] Phone: [phone] Fax: [phone] Hours: Not open 24 hours  Has the patient been seen for an appointment in the last year OR does the patient have an upcoming appointment? Yes.    Agent: Please be advised that RX refills may take up to 3 business days. We ask that you follow-up with your pharmacy.

## 2022-07-04 ENCOUNTER — Telehealth: Payer: 59

## 2022-07-04 ENCOUNTER — Telehealth: Payer: Self-pay

## 2022-07-04 MED ORDER — INSULIN LISPRO (1 UNIT DIAL) 100 UNIT/ML (KWIKPEN): PEN_INJECTOR | SUBCUTANEOUS | 0 refills | Status: DC

## 2022-07-04 NOTE — Telephone Encounter (Signed)
Requested Prescriptions  Pending Prescriptions Disp Refills  . insulin lispro (HUMALOG) 100 UNIT/ML KwikPen 15 mL 0     Endocrinology:  Diabetes - Insulins Failed - 07/03/2022 11:21 AM      Failed - HBA1C is between 0 and 7.9 and within 180 days    Hemoglobin A1C  Date Value Ref Range Status  07/13/2016 7.9  Final   HB A1C (BAYER DCA - WAIVED)  Date Value Ref Range Status  01/05/2021 9.4 (H) <7.0 % Final    Comment:                                          Diabetic Adult            <7.0                                       Healthy Adult        4.3 - 5.7                                                           (DCCT/NGSP) American Diabetes Association's Summary of Glycemic Recommendations for Adults with Diabetes: Hemoglobin A1c <7.0%. More stringent glycemic goals (A1c <6.0%) may further reduce complications at the cost of increased risk of hypoglycemia.    Hgb A1c MFr Bld  Date Value Ref Range Status  05/05/2022 9.3 (H) 4.8 - 5.6 % Final    Comment:             Prediabetes: 5.7 - 6.4          Diabetes: >6.4          Glycemic control for adults with diabetes: <7.0          Passed - Valid encounter within last 6 months    Recent Outpatient Visits          2 months ago Annual physical exam   [facility] Bill[facility], NP   5 months ago Hypertension associated with diabetes [facility])   [facility] Bill[facility], NP   8 months ago Hypertension associated with diabetes [facility])   [facility] Jon Billings, NP   10 months ago Hypertension associated with diabetes [facility])   [facility] Jon Billings, NP   11 months ago Hypertension associated with diabetes [facility])   [place], Karen, NP      Future Appointments            In 1 month Jon Billings, NP [facility], [place]

## 2022-07-04 NOTE — Telephone Encounter (Signed)
  Care Management   Follow Up Note   07/04/2022 Name: Tami Cummings MRN: [id number] DOB: 07-18-1946   Referred by: Jon Billings, NP Reason for referral : Chronic Care Management (RNCM: Follow up/Case Closure for Chronic Disease Management and Care Coordination Needs)   An unsuccessful telephone outreach was attempted today. The patient was referred to the case management team for assistance with care management and care coordination.   Follow Up Plan: No further follow up required: the patient has met the goals of care and the care plan has been closed.   Noreene Larsson RN, MSN, [facility] Mobile: [phone]

## 2022-07-05 ENCOUNTER — Ambulatory Visit: Payer: Medicare Other

## 2022-07-05 ENCOUNTER — Other Ambulatory Visit: Payer: Self-pay | Admitting: Nurse Practitioner

## 2022-07-06 ENCOUNTER — Other Ambulatory Visit: Payer: Self-pay | Admitting: Nurse Practitioner

## 2022-07-06 NOTE — Telephone Encounter (Signed)
Pt received a refill for Humalog but it was sent to [place] / pt is back home and needs the refill resent to [place], [place]  Please advise

## 2022-07-06 NOTE — Telephone Encounter (Signed)
Refilled 07/04/22.

## 2022-07-06 NOTE — Telephone Encounter (Signed)
Medication Refill - Medication: insulin lispro (HUMALOG) 100 UNIT/ML KwikPen  Has the patient contacted their pharmacy? No.   Preferred Pharmacy (with phone number or street name):  [place] [place] Phone:  [phone]  Fax:  [phone]      Has the patient been seen for an appointment in the last year OR does the patient have an upcoming appointment? Yes.     Patient knows to contact the pharmacy for any future refills

## 2022-07-07 MED ORDER — INSULIN LISPRO (1 UNIT DIAL) 100 UNIT/ML (KWIKPEN): PEN_INJECTOR | SUBCUTANEOUS | 0 refills | Status: DC

## 2022-07-07 NOTE — Telephone Encounter (Signed)
Change of pharmacy per pt request Requested Prescriptions  Pending Prescriptions Disp Refills  . insulin lispro (HUMALOG) 100 UNIT/ML KwikPen 15 mL 0    Sig: INJECT 25 UNITS SUBCUTANEOUSLY TWICE DAILY BEFORE A MEAL     Endocrinology:  Diabetes - Insulins Failed - 07/06/2022  1:50 PM      Failed - HBA1C is between 0 and 7.9 and within 180 days    Hemoglobin A1C  Date Value Ref Range Status  07/13/2016 7.9  Final   HB A1C (BAYER DCA - WAIVED)  Date Value Ref Range Status  01/05/2021 9.4 (H) <7.0 % Final    Comment:                                          Diabetic Adult            <7.0                                       Healthy Adult        4.3 - 5.7                                                           (DCCT/NGSP) American Diabetes Association's Summary of Glycemic Recommendations for Adults with Diabetes: Hemoglobin A1c <7.0%. More stringent glycemic goals (A1c <6.0%) may further reduce complications at the cost of increased risk of hypoglycemia.    Hgb A1c MFr Bld  Date Value Ref Range Status  05/05/2022 9.3 (H) 4.8 - 5.6 % Final    Comment:             Prediabetes: 5.7 - 6.4          Diabetes: >6.4          Glycemic control for adults with diabetes: <7.0          Passed - Valid encounter within last 6 months    Recent Outpatient Visits          2 months ago Annual physical exam   [facility] Jon Billings, NP   5 months ago Hypertension associated with diabetes [facility])   [facility] Jon Billings, NP   8 months ago Hypertension associated with diabetes [facility])   [facility] Jon Billings, NP   10 months ago Hypertension associated with diabetes [facility])   [facility] Jon Billings, NP   12 months ago Hypertension associated with diabetes [facility])   [place], Karen, NP      Future Appointments            In 1 month Jon Billings, NP [facility], [place]

## 2022-07-10 ENCOUNTER — Ambulatory Visit (INDEPENDENT_AMBULATORY_CARE_PROVIDER_SITE_OTHER): Payer: 59

## 2022-07-10 ENCOUNTER — Other Ambulatory Visit: Payer: Self-pay | Admitting: Nurse Practitioner

## 2022-07-10 DIAGNOSIS — E1142 Type 2 diabetes mellitus with diabetic polyneuropathy: Secondary | ICD-10-CM

## 2022-07-10 DIAGNOSIS — E1169 Type 2 diabetes mellitus with other specified complication: Secondary | ICD-10-CM

## 2022-07-10 DIAGNOSIS — E1159 Type 2 diabetes mellitus with other circulatory complications: Secondary | ICD-10-CM

## 2022-07-10 MED ORDER — TRULICITY 0.75 MG/0.5ML ~~LOC~~ SOAJ: 0.7500 mg | SUBCUTANEOUS | 0 refills | Status: DC

## 2022-07-10 NOTE — Patient Instructions (Signed)
Visit Information   Goals Addressed   None    Patient Care Plan: RNCM: Diabetes Type 2 (Adult)  Completed 10/21/2021   Problem Identified: RNCM: Glycemic Management (Diabetes, Type 2) Resolved 10/21/2021  Priority: High     Long-Range Goal: RNCM: Glycemic Management Optimized Completed 10/21/2021  Start Date: 02/09/2021  Expected End Date: 07/03/2022  Recent Progress: On track  Priority: High  Note:   Objective: resolving, duplicate goals Lab Results  Component Value Date   HGBA1C 8.4 (H) 07/12/2021    Lab Results  Component Value Date   CREATININE 0.87 07/12/2021    No results found for: EGFR Current Barriers:  Knowledge Deficits related to basic Diabetes pathophysiology and self care/management Knowledge Deficits related to medications used for management of diabetes Literacy barriers Does not use cbg meter- on a consistent basis  Limited Social Support Non-compliance  Unable to self administer medications as prescribed Does not attend all scheduled provider appointments Does not adhere to prescribed medication regimen Lacks social connections Does not maintain contact with provider office Does not contact provider office for questions/concerns Case Manager Clinical Goal(s):  patient will demonstrate improved adherence to prescribed treatment plan for diabetes self care/management as evidenced by: daily monitoring and recording of CBG  adherence to ADA/ carb modified diet exercise 3/4 days/week adherence to prescribed medication regimen contacting provider for new or worsened symptoms or questions Interventions:  Collaboration with Jon Billings, NP regarding development and update of comprehensive plan of care as evidenced by provider attestation and co-signature Inter-disciplinary care team collaboration (see longitudinal plan of care) Provided education to patient about basic DM disease process. 07-08-2021: Extensive review and reeducation on DM and the effects of  uncontrolled DM on body systems. The patient states she is compliant but her hemoglobin A1C is elevated to 9.6. Education on goal of hemoglobin A1C of <7.0. 08-19-2021: The patient with trending down of A1C to 8.4 in July. Praised for getting down, but encouraged the patient to work on getting to goal. The patient has not been checking blood sugars due to not having batteries for her device.  Reviewed medications with patient and discussed importance of medication adherence. 07-08-2021: The patient states compliance with the current medications regimen. The patient is currently working with the pharmacist also. The patient was able to tell the RNCM her current medications regimen. 08-19-2021: Endorses compliance with medications.  Discussed plans with patient for ongoing care management follow up and provided patient with direct contact information for care management team Provided patient with written educational materials related to hypo and hyperglycemia and importance of correct treatment. Reviewed with the patient sx/sx to look for with hypo/hyperglycemia. The patient denies any issues and says she feels "great"  08-19-2021: Denies any lows at this time. Reviewed scheduled/upcoming provider appointments including: 07-12-2021 at 1020 am Advised patient, providing education and rationale, to check cbg BID and record, calling pcp for findings outside established parameters.  The patient has not checked her blood sugars because she does not know how to work the meter. Trouble shooting attempted. The patient is going to see if her daughter can help her figure it out, or she will take it back to the pharmacy and get them to help her review how to use. Will touch base with pham D also to see if any recommendations. Expressed the importance of blood sugar checks especially since there has been a change in her medications. The patient verbalized understanding. Goal for blood sugars: fasting <130 and post prandial <180.  07-08-2021: The patient is still unable to provide a list of readings for the [facility]. She states that she has not taken her blood sugar this am and does not take every day because of caring for her grandchildren. Expressed concern that she is not taking consistently. This has been an ongoing concern with the patient. She states yesterday it was 173 before supper.  Education on the rational for checking blood sugars and recording. Reeducation on fasting blood sugar <130 and post prandial <180. The patient needs frequent reminders to check blood sugars. Reviewed the goal of hemoglobin A1C of 7.0 or less. Discussed how her A1C has went in the higher range and the importance of medication compliance, exercise and dietary restrictions. The patient states she stopped eating sweets 2 weeks ago and she is doing well without eating sweets. Praised the patient for positive change and gave her alternatives to try when she has a sweet craving. Also educated the patient on taking blood sugars more frequently and recording, having record to take with her to the pcp on visit coming up on 07-12-2021. 08-19-2021: The patient saw the pcp on 08-16-2021. The patient states she cannot check her blood sugars because her meter needs batteries. She states she received her SS check today and she is going when her daughter takes her to get batteries for the meter. States that she will start taking again. Expressed concern and recommended the patient check her blood sugars regularly due to fluctuations in the past. The patient verbalized understanding.  Review of patient status, including review of consultants reports, relevant laboratory and other test results, and medications completed. Patient Goals: - barriers to adherence to treatment plan identified - blood glucose monitoring encouraged - blood glucose readings reviewed - individualized medical nutrition therapy provided - mutual A1C goal set or reviewed - resources required to improve  adherence to care identified - self-awareness of signs/symptoms of hypo or hyperglycemia encouraged - use of blood glucose monitoring log promoted Self-Care Activities - UNABLE to independently manage DM Attends all scheduled provider appointments Checks blood sugars as prescribed and utilize hyper and hypoglycemia protocol as needed Adheres to prescribed ADA/carb modified Follow Up Plan: Telephone follow up appointment with care management team member scheduled for: 10-21-2021 at 1030 am     Task: RNCM: Alleviate Barriers to Glycemic Management Completed 07/08/2021  Outcome: Positive  Note:   Care Management Activities:    - barriers to adherence to treatment plan identified - blood glucose monitoring encouraged - blood glucose readings reviewed - individualized medical nutrition therapy provided - mutual A1C goal set or reviewed - resources required to improve adherence to care identified - self-awareness of signs/symptoms of hypo or hyperglycemia encouraged - use of blood glucose monitoring log promoted        Patient Care Plan: RNCM: Hypertension (Adult)  Completed 10/21/2021   Problem Identified: RNCM: Hypertension (Hypertension) Resolved 10/21/2021  Priority: Medium     Long-Range Goal: RNCM: Hypertension Monitored Completed 10/21/2021  Start Date: 02/09/2021  Expected End Date: 05/04/2022  Recent Progress: On track  Priority: Medium  Note:   Objective: resolving, duplicate goal Last practice recorded BP readings:  BP Readings from Last 3 Encounters:  08/16/21 138/78  07/12/21 (!) 146/75  05/19/21 (!) 152/73   Most recent eGFR/CrCl:  Lab Results  Component Value Date   EGFR 70 07/12/2021    No components found for: CRCL Current Barriers:  Knowledge Deficits related to basic understanding of hypertension pathophysiology and self care management Knowledge  Deficits related to understanding of medications prescribed for management of hypertension Non-adherence to  prescribed medication regimen Non-adherence to scheduled provider appointments Unable to independently manage HTN Does not attend all scheduled provider appointments Does not adhere to prescribed medication regimen Does not contact provider office for questions/concerns Case Manager Clinical Goal(s):  patient will verbalize understanding of plan for hypertension management patient will attend all scheduled medical appointments: 10-17-2021 at 1000 am patient will demonstrate improved adherence to prescribed treatment plan for hypertension as evidenced by taking all medications as prescribed, monitoring and recording blood pressure as directed, adhering to low sodium/DASH diet patient will demonstrate improved health management independence as evidenced by checking blood pressure as directed and notifying PCP if SBP>160 or DBP > 90, taking all medications as prescribe, and adhering to a low sodium diet as discussed. patient will verbalize basic understanding of hypertension disease process and self health management plan as evidenced by compliance with medications, compliance with heart healthy/ADA diet and working with the CCM team to effectively manage health and well being  Interventions:  Collaboration with Larae Grooms, NP regarding development and update of comprehensive plan of care as evidenced by provider attestation and co-signature Inter-disciplinary care team collaboration (see longitudinal plan of care) Evaluation of current treatment plan related to hypertension self management and patient's adherence to plan as established by provider. 08-19-2021: The patient saw the pcp this week and her blood pressure is WNL. The patient states that she is doing well and denies any new issues with HTN management. Will continue to monitor.  Provided education to patient re: stroke prevention, s/s of heart attack and stroke, DASH diet, complications of uncontrolled blood pressure Reviewed medications  with patient and discussed importance of compliance. 08-19-2021: Is compliant with medications management  Discussed plans with patient for ongoing care management follow up and provided patient with direct contact information for care management team Advised patient, providing education and rationale, to monitor blood pressure daily and record, calling PCP for findings outside established parameters.  Reviewed scheduled/upcoming provider appointments including: 10-17-2021 at 1000 am Self-Care Activities: - Self administers medications as prescribed Attends all scheduled provider appointments Calls provider office for new concerns, questions, or BP outside discussed parameters Checks BP and records as discussed Follows a low sodium diet/DASH diet Patient Goals: - check blood pressure weekly - choose a place to take my blood pressure (home, clinic or office, retail store) - write blood pressure results in a log or diary - agree on reward when goals are met - agree to work together to make changes - ask questions to understand - have a family meeting to talk about healthy habits - learn about high blood pressure  Follow Up Plan: Telephone follow up appointment with care management team member scheduled for: 10-21-2021 at 1030 am    Task: RNCM: Identify and Monitor Blood Pressure Elevation Completed 07/08/2021  Outcome: Positive  Note:   Care Management Activities:    - blood pressure equipment and technique reviewed - blood pressure trends reviewed - depression screen reviewed - home or ambulatory blood pressure monitoring encouraged    Notes: The patient states she can not find the cuff end to her blood pressure cuff. The patient advised to use OTC benefit through [facility] to get new cuff. The patient will check into this     Patient Care Plan: RNCM: HLD Management  Completed 10/21/2021   Problem Identified: RNCM: Management of HLD Resolved 10/21/2021  Priority: Medium     Long-Range  Goal: RNCM: HLD Management Completed 10/21/2021  Start Date: 02/09/2021  Expected End Date: 06/06/2022  Recent Progress: On track  Priority: Medium  Note:   Current Barriers: resolving, duplicate goal Poorly controlled hyperlipidemia, complicated by uncontrolled DM Current antihyperlipidemic regimen: Lovastatin 40 mg QD Most recent lipid panel:     Component Value Date/Time   CHOL 127 01/05/2021 1114   CHOL 137 12/04/2018 1106   TRIG 112 01/05/2021 1114   TRIG 262 (H) 12/04/2018 1106   HDL 37 (L) 01/05/2021 1114   CHOLHDL 4.0 04/10/2017 1347   VLDL 52 (H) 12/04/2018 1106   [place] 69 01/05/2021 1114   ASCVD risk enhancing conditions: age >49, DM, HTN, CKD, former smoker Unable to self administer medications as prescribed Does not attend all scheduled provider appointments Does not adhere to prescribed medication regimen Lacks social connections Does not contact provider office for questions/concerns  RN Care Manager Clinical Goal(s):   patient will work with Consulting civil engineer, providers, and care team towards execution of optimized self-health management plan patient will verbalize understanding of plan for effective management of HLD  patient will work with [facility] and pcp  to address needs related to HLD  patient will attend all scheduled medical appointments: 10-17-2021  Interventions: Collaboration with Jon Billings, NP regarding development and update of comprehensive plan of care as evidenced by provider attestation and co-signature Inter-disciplinary care team collaboration (see longitudinal plan of care) Medication review performed; medication list updated in electronic medical record. 08-19-2021: The medications are current and the patient is compliant with medications at this time.   Inter-disciplinary care team collaboration (see longitudinal plan of care) Referred to pharmacy team for assistance with HLD medication management Evaluation of current treatment plan related  to HLD  and patient's adherence to plan as established by provider. 08-19-2021: The patient is doing well with her diet at this time. The patient states she has been more mindful of what she is eating. The patient has cut out sweets from her diet. Denies any acute issues. Will continue to monitor for changes.  Advised patient to call the office for changes or questions. 08-19-2021: The patient states that she is doing well and denies any new concerns with management of her HLD. States she is staying active and following her medications regimen.  Provided education to patient re: heart healthy diet, medications compliance, working with the CCM team  Reviewed medications with patient and discussed compliance, the patient endorses taking medications as directed Reviewed scheduled/upcoming provider appointments including: 10-17-2021 at 1000 am Discussed plans with patient for ongoing care management follow up and provided patient with direct contact information for care management team   Patient Goals/Self-Care Activities: patient will:   - call for medicine refill 2 or 3 days before it runs out - call if I am sick and can't take my medicine - keep a list of all the medicines I take; vitamins and herbals too - learn to read medicine labels - use a pillbox to sort medicine - use an alarm clock or phone to remind me to take my medicine - change to whole grain breads, cereal, pasta - drink 6 to 8 glasses of water each day - eat 3 to 5 servings of fruits and vegetables each day - eat 5 or 6 small meals each day - fill half the plate with nonstarchy vegetables - limit fast food meals to no more than 1 per week - manage portion size - prepare main meal at home 3 to  5 days each week - read food labels for fat, fiber, carbohydrates and portion size - be open to making changes - I can manage, know and watch for signs of a heart attack - if I have chest pain, call for help - learn about small changes that  will make a big difference - learn my personal risk factors   Follow Up Plan: Telephone follow up appointment with care management team member scheduled for: 11-4--2022 at 1030 am      Task: Mutually Develop and Malen Gauze Achievement of Patient Goals Completed 07/08/2021  Outcome: Positive  Note:   Care Management Activities:    - barriers to meeting goals identified - change-talk evoked - choices provided - collaboration with team encouraged - decision-making supported - difficulty of making life-long changes acknowledged - health risks reviewed - problem-solving facilitated - questions answered - readiness for change evaluated - reassurance provided - self-reflection promoted - self-reliance encouraged - verbalization of feelings encouraged    Notes:     Patient Care Plan: CCM Pharmacy Care Plan     Problem Identified: DM2, HTN, HLD, Osteopenia, CKD,   Priority: High     Long-Range Goal: Disease Management   Start Date: 05/22/2022  Recent Progress: On track  Priority: High  Note:   Current Barriers:  Unable to achieve control of DM   Pharmacist Clinical Goal(s):  Patient will verbalize ability to afford treatment regimen achieve adherence to monitoring guidelines and medication adherence to achieve therapeutic efficacy through collaboration with PharmD and provider.   Interventions: 1:1 collaboration with Larae Grooms, NP regarding development and update of comprehensive plan of care as evidenced by provider attestation and co-signature Inter-disciplinary care team collaboration (see longitudinal plan of care) Comprehensive medication review performed; medication list updated in electronic medical record  Hypertension (BP goal <130/80) BP Readings from Last 3 Encounters:  05/05/22 138/74  02/03/22 123/69  10/17/21 133/73  Not ideally controlled -Current treatment: Hydralazine 10 mg twice daily Appropriate, Effective, Safe, Accessible Atenolol 100 mg  once daily Appropriate, Effective, Safe, Accessible Spironolactone 25 mg once daily Appropriate, Effective, Safe, Accessible Amlodipine 10 mg once daily  Appropriate, Effective, Safe, Accessible -Medications previously tried: benazepril (angioedema), HCTZ (angioedema)  -Current home readings:  July 2023: 07/10/22: 145/80 -Current dietary habits:  -Current exercise habits:  -Denies hypotensive/hypertensive symptoms -Educated on BP goals and benefits of medications for prevention of heart attack, stroke and kidney damage; -Counseled to monitor BP at home 1-2x/wk, document, and provide log at future appointments -Counseled on diet and exercise extensively July 2023: Patient's BP was high but I could also hear her breathing heavily, speaking with me while BP being taken, and she was frustrated because she couldn't find machine. Will have Concierge call weekly and counseled patient to write down values every day.  Diabetes (A1c goal <8%) Lab Results  Component Value Date   HGBA1C 9.3 (H) 05/05/2022   HGBA1C 8.9 (H) 02/03/2022   HGBA1C 8.3 (H) 10/17/2021   Lab Results  Component Value Date   MICROALBUR 30 (H) 01/05/2021   LDLCALC 105 (H) 05/05/2022   CREATININE 0.93 05/05/2022   Lab Results  Component Value Date   NA 140 05/05/2022   K 4.1 05/05/2022   CREATININE 0.93 05/05/2022   EGFR 64 05/05/2022   GFRNONAA 63 01/05/2021   GLUCOSE 99 05/05/2022   Lab Results  Component Value Date   WBC 8.1 05/05/2022   HGB 14.3 05/05/2022   HCT 44.0 05/05/2022   MCV 80 05/05/2022  PLT 228 05/05/2022  -Uncontrolled -Current medications: Tresiba 540 units once daily  Appropriate, Query Effective Humalog 25 units twice daily  Appropriate, Query Effective Trulicity 4.5 mg once weekly on Wednesday  Appropriate, Query Effective Jaridance 25 mg once daily  Appropriate, Query Effective -Medications previously tried: metformin (GI issues), actos, Januvia (trulicity transition), Onglyza   -Current home glucose readings fasting glucose: not testing post prandial glucose: not testing -Denies hypoglycemic/hyperglycemic symptoms -Current meal patterns: mashed potatoes, beans, fried chicken. Some sweets. Sliced ham. No greens. Some fruits, strawberries, apples, bananas. Bowl of cereal for breakfast. Diet soda (1 big bottle per day b/w her and grand dtr), no tea. -Current exercise: walks with grand dtr daily  -Educated on A1c and blood sugar goals; July 2023: Patient hasn't picked up Trulicity since Jan. Coordinated with Vinnie Level at [place] and will get script ready to be picked up this afternoon for daughter to pick up. Karen sent in new/lower dose to prevent ADR's. Made sure Vinnie Level at [place] has the Pharmacist [employer]* when dispensing -Patient is a very poor historian. Doesn't know what medications are for. When speaking to her about her veins she stated, "I don't know what veins even look like. I've never looked at them before." When speaking about "Maple syrup" she stated, "I don't know what Maple Syrup is." Patient kept stating, "I'm very healthy, I feel great." Plan: Will have CCM team reach out to patient weekly    Patient Care Plan: RNCM: General Plan of Care (Adult) for Chronic Disease Management and Care Coordination Needs  Completed 07/04/2022   Problem Identified: RNCM: Development of plan of care for Chronic Disease Management (DM, HTN, HLD, left knee pain) Resolved 07/04/2022  Priority: High     Long-Range Goal: RNCM: Effective Management  of plan of care for Chronic Disease Management (DM, HTN, HLD, left knee pain) Completed 07/04/2022  Start Date: 10/21/2021  Expected End Date: 10/21/2022  Priority: High  Note:   Current Barriers: 07-05-2022: Goals met and care plan is being closed  Knowledge Deficits related to plan of care for management of HTN, HLD, and DMII and left knee pain  Chronic Disease Management support and education needs related to HTN, HLD, and DMII,  and left knee pain   RNCM Clinical Goal(s):  Patient will verbalize understanding of plan for management of HTN, HLD, and DMII  and left knee pain as evidenced by keeping appointments, following the plan of care and following recommendations by the pcp and CCM team take all medications exactly as prescribed and will call provider for medication related questions as evidenced by directed and calling for refills before running out of medications    attend all scheduled medical appointments: pcp and specialist as evidenced by keeping appointments        demonstrate improved and ongoing adherence to prescribed treatment plan for HTN, HLD, left knee pain, and DMII as evidenced by taking blood glucose levels on a consistent basis and compliance with dietary restrictions  demonstrate a decrease in HTN, HLD, left knee pain,  and DMII exacerbations  as evidenced by calling the office for changes in condition, questions, or concerns, seeking emergent help for acute exacerbations, and working with the CCM team to optimize health and well being demonstrate ongoing self health care management ability for effective management of chronic conditions as evidenced by working with the CCM team  through collaboration with Consulting civil engineer, provider, and care team.   Interventions: 1:1 collaboration with primary care provider regarding development and update  of comprehensive plan of care as evidenced by provider attestation and co-signature Inter-disciplinary care team collaboration (see longitudinal plan of care) Evaluation of current treatment plan related to  self management and patient's adherence to plan as established by provider   SDOH Barriers (Status: Goal on Track (progressing): YES.) Long Term Goal  Patient interviewed and SDOH assessment performed        Patient interviewed and appropriate assessments performed Provided patient with information about resources available in [facility] and care guides  to assist with changes in SDOH, or new needs  Discussed plans with patient for ongoing care management follow up and provided patient with direct contact information for care management team Advised patient to call the office for changes in SDOH, new questions or concerns    Diabetes:  (Status: Goal on track: NO.) Long Term Goal   Lab Results  Component Value Date   HGBA1C 9.3 (H) 05/05/2022  Previous on 02-03-2022: 8.9 Assessed patient's understanding of A1c goal: <7%. 04-07-2022: Review of goal of A1C. The patient knows she is above goal. Will follow up with the pcp on 05-03-2022. 06-09-2022: The patients A1C is elevated more. The patient states she knows the goal but she has not been taking blood sugars due to not having a working meter. Call made to [facility] in [place] and also sent a secure message to the pcp. Provided education to patient about basic DM disease process. 06-09-2022: Review with the patient. Extensive review and education provided. The patient states she feels "fine". Review of the ill effects of uncontrolled DM on body systems and the need to monitor DM more closely. Education and support given. ; Reviewed medications with patient and discussed importance of medication adherence. 10-21-2021: The patient confirms she is taking Trulicity 4.5 mg as prescribed, she has also started the Vitam D at 1000 IU. 02-03-2022: The patient is compliant with medications.   04-07-2022: The patient states she is no longer taking the Jardiance, pcp discontinued. She is taking Trulicity 4.5 mg, Tresbia 50 units and Humalog 25 units BID. Denies any issues with medications compliance. 06-09-2022: The patient states that she is taking the correct dosages, but was unable to tell the RNCM the doses. Review of pcp notes and pharm D notes and patient educated on the doses and review of medications. She states she takes what the pharmacy gives her.    Patient needs frequent reminders on making sure she takes her  medications as directed.     Reviewed prescribed diet with patient heart healthy/ADA. 02-03-2022: The patient is eating well and is mindful of the food she eats. 06-09-2022: Review and ongoing education. The patient verbalized she feels great and it does not even feel like she has DM. Review of goal of care and plan for healthy choices and effective DM management. Ongoing education and support needed as the patient feels "fine". Review of how uncontrolled DM can cause issues with eye sight, kidney function, healing, and other risk. The patient verbalized understanding. Counseled on importance of regular laboratory monitoring as prescribed. 04-07-2022: The patient gets regular lab work done. The patient likely will have new lab work at her appointment today.   06-09-2022: The patient has blood work on a regular basis. Most recent A1C was elevated to 9.3. Education and review.  Discussed plans with patient for ongoing care management follow up and provided patient with direct contact information for care management team;      Provided patient with written educational materials related to  hypo and hyperglycemia and importance of correct treatment. 04-07-2022: The patient denies any highs or lows but has not been checking levels. Encouraged the patient to check blood sugar levels regularly. Review of sx and sx of hypo and hyperglycemia and for the patient to monitor for changes.  06-09-2022: The patient has not been checking her blood sugars because she does not have a working meter. Denies any sx and sx of hypo or hyperglycemia. Education and support given.     Reviewed scheduled/upcoming provider appointments including: next pcp appointment on 08-07-2022 at 3 pm. Review of other visits coming up and the need to keep appointments.;         Advised patient, providing education and rationale, to check cbg as directed  and record.  12-16-2021: The patient does not check blood sugars on a consistent basis. The patient has  to be reminded on outreaches the importance of checking blood sugars on a regular basis and recording. The patient denies any issues with blood sugar meter but feels she is "too busy" to check. Extensive education on the benefits of checking blood sugars and effective management of DM on over all health and well being.  04-07-2022: The patient continues not to check blood sugars readings. The patient has a long standing history of not checking blood sugars. Encouraged the patient to check blood sugar readings on a regular basis. Will continue to monitor.  The patient states she does not have time to check with trying to keep up with her 33 year old granddaughter. Reviewed the benefits of checking blood sugars with the goal of fasting <130 and post prandial of <180.   06-09-2022: The patient states that [employer] has not called her to say her new meter was ready. She tried to get her old meter to work but cannot get it to work. The patient on the phone with the [facility] and a call was made to [facility]. They have an order for the lancets and strips but not the meter. Secure chat message sent to pcp asking for assistance in ordering the meter for the patient. Also will sent an in basket message to the provider. Education provided to the patient on the directions of the need to check blood sugars on a regular basis as prescribed by the pcp and write them down in a log to bring to her next appointment. The patient easily gets distracted when trying to effectively manage her DM. Ongoing support and education provided to the patient  call provider for findings outside established parameters. 06-09-2022: Review and education given.;       Review of patient status, including review of consultants reports, relevant laboratory and other test results, and medications completed. 02-03-2022: The patient to see the pcp today. Will likely be ordered for new lab work to evaluate A1C. 06-09-2022: Review of goal of A1C. Discussed the patient  being above goal and recommendations provided on healthy changes the patient can make to help with effective management of her DM health and well being.      Screening for signs and symptoms of depression related to chronic disease state;        Assessed social determinant of health barriers;         Hyperlipidemia:  (Status: Goal on Track (progressing): YES.) Lab Results  Component Value Date   CHOL 170 05/05/2022   HDL 40 05/05/2022   LDLCALC 105 (H) 05/05/2022   TRIG 139 05/05/2022   CHOLHDL 4.3 05/05/2022  Medication review performed; medication list updated in electronic medical record. 06-09-2022: the patient is compliant with medications. The patient takes Lovastatin 40 mg QD  Provider established cholesterol goals reviewed. 06-09-2022: Review and education provided. Patients levels are better but still above goal. Education and support given. Review of goals of cholesterol levels Counseled on importance of regular laboratory monitoring as prescribed. 06-07-2022: The patient has labs on a regular basis. ; Provided HLD educational materials; Reviewed role and benefits of statin for ASCVD risk reduction; Discussed strategies to manage statin-induced myalgias; Reviewed importance of limiting foods high in cholesterol. 06-09-2022: Review of foods high in cholesterol. The patient states she is eating well. Review of heart healthy/ADA diet and to monitor for hidden fats, salts and sugars in her dietary intake.   Hypertension: (Status: Goal on Track (progressing): YES.) Last practice recorded BP readings:  BP Readings from Last 3 Encounters:  05/05/22 138/74  02/03/22 123/69  10/17/21 133/73  Most recent eGFR/CrCl:  Lab Results  Component Value Date   EGFR 64 05/05/2022    No components found for: CRCL  Evaluation of current treatment plan related to hypertension self management and patient's adherence to plan as established by provider. 06-09-2022: The patient states that she is  doing well and has no new concerns with HTN or heart health.  The patients readings have been stable. Does not check at home but feels "fine" Provided education to patient re: stroke prevention, s/s of heart attack and stroke; Reviewed prescribed diet heart healthy/ADA diet. 06-09-2022: The patient states she is eating well and denies using extra salt in her foods. States compliance with a heart healthy/ADA diet. Education on watching fried foods and carbohydrates. Ongoing support and education provided Reviewed medications with patient and discussed importance of compliance. 06-09-2022: Is compliant with medications   Counseled on adverse effects of illicit drug and excessive alcohol use in patients with high blood pressure;  Discussed plans with patient for ongoing care management follow up and provided patient with direct contact information for care management team; Advised patient, providing education and rationale, to monitor blood pressure daily and record, calling PCP for findings outside established parameters;  Provided education on prescribed diet heart healthy/ADA diet ;  Discussed complications of poorly controlled blood pressure such as heart disease, stroke, circulatory complications, vision complications, kidney impairment, sexual dysfunction;   Pain:  (Status: Goal Met.) Long Term Goal 06-09-2022: Goals have been met and pain care plan being closed at this time. Will continue to monitor for changes or new needs. Pain assessment performed. 12-16-2021: The patient states she has left knee pain rates it at a 5 today. She states she has been dealing with the pain for years and has been told that she needs a knee replacement. She denies any falls or acute distress. Encouraged the patient to discuss pain in left knee with her pcp at appointment in February. Will continue to monitor for changes. 02-03-2022: The patient denies any knee pain or headache pain today. She says she is feeling well and  doing great. 04-07-2022: The patient denies any pain today. She was getting ready to go outside and mow her yard. Discussed pacing activity and not getting too hot.  Medications reviewed. 04-07-2022: States she is compliant with medications when she takes something for pain  Reviewed provider established plan for pain management; Discussed importance of adherence to all scheduled medical appointments. Next appointment to see the pcp is 05-03-2022. Encouraged the patient to talk to the pcp about any  new concerns or questions she may have related to pain or pain management; Counseled on the importance of reporting any/all new or changed pain symptoms or management strategies to pain management provider; Advised patient to report to care team affect of pain on daily activities; Discussed use of relaxation techniques and/or diversional activities to assist with pain reduction (distraction, imagery, relaxation, massage, acupressure, TENS, heat, and cold application; Reviewed with patient prescribed pharmacological and nonpharmacological pain relief strategies; Advised patient to discuss unresolved pain, changes in level or intensity of pain  with provider; Screening for signs and symptoms of depression related to chronic disease state;  Assessed social determinant of health barriers;    Patient Goals/Self-Care Activities: Patient will self administer medications as prescribed as evidenced by self report/primary caregiver report  Patient will attend all scheduled provider appointments as evidenced by clinician review of documented attendance to scheduled appointments and patient/caregiver report Patient will call pharmacy for medication refills as evidenced by patient report and review of pharmacy fill history as appropriate Patient will attend church or other social activities as evidenced by patient report Patient will continue to perform ADL's independently as evidenced by patient/caregiver  report Patient will continue to perform IADL's independently as evidenced by patient/caregiver report Patient will call provider office for new concerns or questions as evidenced by review of documented incoming telephone call notes and patient report Patient will work with BSW to address care coordination needs and will continue to work with the clinical team to address health care and disease management related needs as evidenced by documented adherence to scheduled care management/care coordination appointments - schedule appointment with eye doctor - check blood sugar at prescribed times: before meals and at bedtime, when you have symptoms of low or high blood sugar, before and after exercise, and encouraged the patient to take blood sugars on a consistent basis, frequent reminders given to the patient to be compliant with blood sugar checks  - check feet daily for cuts, sores or redness - enter blood sugar readings and medication or insulin into daily log - take the blood sugar log to all doctor visits - trim toenails straight across - drink 6 to 8 glasses of water each day - eat fish at least once per week - fill half of plate with vegetables - limit fast food meals to no more than 1 per week - manage portion size - prepare main meal at home 3 to 5 days each week - read food labels for fat, fiber, carbohydrates and portion size - reduce red meat to 2 to 3 times a week - keep feet up while sitting - wash and dry feet carefully every day - wear comfortable, cotton socks - wear comfortable, well-fitting shoes - check blood pressure weekly - choose a place to take my blood pressure (home, clinic or office, retail store) - write blood pressure results in a log or diary - learn about high blood pressure - keep a blood pressure log - take blood pressure log to all doctor appointments - call doctor for signs and symptoms of high blood pressure - develop an action plan for high blood  pressure - keep all doctor appointments - take medications for blood pressure exactly as prescribed - report new symptoms to your doctor - eat more whole grains, fruits and vegetables, lean meats and healthy fats - call for medicine refill 2 or 3 days before it runs out - take all medications exactly as prescribed - call doctor with any symptoms  you believe are related to your medicine - call doctor when you experience any new symptoms - go to all doctor appointments as scheduled - adhere to prescribed diet: heart healthy/ADA diet        Ms. Sesma was given information about Chronic Care Management services today including:  CCM service includes personalized support from designated clinical staff supervised by her physician, including individualized plan of care and coordination with other care providers 24/7 contact phone numbers for assistance for urgent and routine care needs. Standard insurance, coinsurance, copays and deductibles apply for chronic care management only during months in which we provide at least 20 minutes of these services. Most insurances cover these services at 100%, however patients may be responsible for any copay, coinsurance and/or deductible if applicable. This service may help you avoid the need for more expensive face-to-face services. Only one practitioner may furnish and bill the service in a calendar month. The patient may stop CCM services at any time (effective at the end of the month) by phone call to the office staff.  Patient agreed to services and verbal consent obtained.   The patient verbalized understanding of instructions, educational materials, and care plan provided today and DECLINED offer to receive copy of patient instructions, educational materials, and care plan.  The pharmacy team will reach out to the patient again over the next 60 days.   Lane Hacker, [place]

## 2022-07-10 NOTE — Progress Notes (Signed)
Chronic Care Management Pharmacy Note  07/10/2022 Name:  Tami Cummings MRN:  [id number] DOB:  03-17-1946  Summary: -Pleasant 76 year old female presents for f/u CCM visit. When I asked her to tell me about herself she told me her son was a marine, met a woman in the [place], and moved to [place]. When I asked what she likes to do for fun she stated, "Babysit grandkids" -I believe her daughter lives close by and picks up meds for patient at [place]: -Coordinated with PCP. Patient hasn't taken Trulicity since Jan, PCP sent in new script at lower dose -Patient doesn't check BP, was above 096 systolic (See CP).  -Will have CCM team reach out to patient weekly to get BP readings, glucose readings, and make sure patient is taking Trulicity.  Subjective: Tami Cummings is an 76 y.o. year old female who is a primary patient of Jon Billings, NP.  The CCM team was consulted for assistance with disease management and care coordination needs.    Engaged with patient by telephone for follow up visit in response to provider referral for pharmacy case management and/or care coordination services.   Consent to Services:  The patient was given information about Chronic Care Management services, agreed to services, and gave verbal consent prior to initiation of services.  Please see initial visit note for detailed documentation.   Patient Care Team: Jon Billings, NP as PCP - Lolly Mustache, MD (Ophthalmology) Lane Hacker, [facility] (Pharmacist)  Recent office visits: 5/19 PCP - inc Tyler Aas to 50 units daily, start on d3 1000 units daily   Hospital visits: None in previous 6 months  Objective:  Lab Results  Component Value Date   CREATININE 0.93 05/05/2022   CREATININE 0.91 02/03/2022   CREATININE 0.92 10/17/2021    Lab Results  Component Value Date   HGBA1C 9.3 (H) 05/05/2022   Last diabetic Eye exam:  Lab Results  Component Value Date/Time    HMDIABEYEEXA No Retinopathy 04/26/2021 12:00 AM    Last diabetic Foot exam: No results found for: "HMDIABFOOTEX"      Component Value Date/Time   CHOL 170 05/05/2022 1431   CHOL 137 12/04/2018 1106   TRIG 139 05/05/2022 1431   TRIG 262 (H) 12/04/2018 1106   HDL 40 05/05/2022 1431   CHOLHDL 4.3 05/05/2022 1431   VLDL 52 (H) 12/04/2018 1106   LDLCALC 105 (H) 05/05/2022 1431       Latest Ref Rng & Units 05/05/2022    2:31 PM 02/03/2022    2:07 PM 10/17/2021   10:39 AM  Hepatic Function  Total Protein 6.0 - 8.5 g/dL 6.7  6.6  6.7   Albumin 3.7 - 4.7 g/dL 4.3  4.4  4.2   AST 0 - 40 IU/L '18  16  14   ' ALT 0 - 32 IU/L '19  16  11   ' Alk Phosphatase 44 - 121 IU/L 88  94  88   Total Bilirubin 0.0 - 1.2 mg/dL 0.3  0.2  0.4     Lab Results  Component Value Date/Time   TSH 2.260 05/05/2022 02:31 PM   TSH 1.880 01/05/2021 11:14 AM       Latest Ref Rng & Units 05/05/2022    2:31 PM 10/17/2021   10:39 AM 01/05/2021   11:14 AM  CBC  WBC 3.4 - 10.8 x10E3/uL 8.1  8.9  9.6   Hemoglobin 11.1 - 15.9 g/dL 14.3  14.4  14.1  Hematocrit 34.0 - 46.6 % 44.0  44.7  42.7   Platelets 150 - 450 x10E3/uL 228  223  224     Lab Results  Component Value Date/Time   VD25OH 24.7 (L) 05/05/2022 02:31 PM   VD25OH 33.7 02/03/2022 02:07 PM    Clinical ASCVD:  The 10-year ASCVD risk score (Arnett DK, et al., 2019) is: 37.4%   Values used to calculate the score:     Age: 67 years     Sex: Female     Is Non-Hispanic African American: No     Diabetic: Yes     Tobacco smoker: No     Systolic Blood Pressure: 478 mmHg     Is BP treated: Yes     HDL Cholesterol: 40 mg/dL     Total Cholesterol: 170 mg/dL    Other: (CHADS2VASc if Afib, PHQ9 if depression, MMRC or CAT for COPD, ACT, DEXA)  Social History   Tobacco Use  Smoking Status Former   Packs/day: 1.00   Years: 20.00   Total pack years: 20.00   Types: Cigarettes   Quit date: 08/26/2013   Years since quitting: 8.8  Smokeless Tobacco Never    BP Readings from Last 3 Encounters:  05/05/22 138/74  02/03/22 123/69  10/17/21 133/73   Pulse Readings from Last 3 Encounters:  05/05/22 97  02/03/22 90  10/17/21 84   Wt Readings from Last 3 Encounters:  05/05/22 192 lb 6.4 oz (87.3 kg)  02/03/22 196 lb (88.9 kg)  10/17/21 196 lb (88.9 kg)    Assessment: Review of patient past medical history, allergies, medications, health status, including review of consultants reports, laboratory and other test data, was performed as part of comprehensive evaluation and provision of chronic care management services.   SDOH:  (Social Determinants of Health) assessments and interventions performed: Yes SDOH Interventions    Flowsheet Row Most Recent Value  SDOH Interventions   Financial Strain Interventions Intervention Not Indicated  Physical Activity Interventions Patient Refused       SDOH Interventions    Flowsheet Row Most Recent Value  SDOH Interventions   Financial Strain Interventions Intervention Not Indicated  Physical Activity Interventions Patient Refused       SDOH Interventions    Flowsheet Row Most Recent Value  SDOH Interventions   Financial Strain Interventions Intervention Not Indicated  Physical Activity Interventions Patient Refused       CCM Care Plan  Allergies  Allergen Reactions   Actos [Pioglitazone] Other (See Comments)    CHF   Amlodipine Swelling    Leg swelling    Benazepril Other (See Comments)    angioedema   Hydrochlorothiazide Other (See Comments)    angioedema   Metformin And Related Diarrhea    Medications Reviewed Today     Reviewed by Lane Hacker, [facility] (Pharmacist) on 07/10/22 at [place] List Status: <None>   Medication Order Taking? Sig Documenting Provider Last Dose Status Informant  amLODipine (NORVASC) 10 MG tablet [id number] Yes Take 1 tablet (10 mg total) by mouth daily. Marnee Guarneri T, NP Taking Active   atenolol (TENORMIN) 100 MG tablet [id number] Yes  Take 1 tablet (100 mg total) by mouth daily. Marnee Guarneri T, NP Taking Active   BD PEN NEEDLE MICRO U/F 32G X 6 MM MISC [id number]  USE 4 TO 5 TIMES DAILY WITH INSULIN USE Wynetta Emery, Megan P, DO  Active   Blood Glucose Monitoring Suppl (ONETOUCH VERIO) w/Device KIT [id number]  Use to check  blood sugar 2-3 times daily and document, bring results to provider visits. Jon Billings, NP  Active   Dulaglutide (TRULICITY) 6.83 MH/9.6QI SOPN [id number] No Inject 0.75 mg into the skin once a week.  Patient not taking: Reported on 07/10/2022   Jon Billings, NP Not Taking Active   glucose blood test strip [id number]  Use to check blood sugar 2-3 times daily and document, bring results to provider visits. Jon Billings, NP  Active   hydrALAZINE (APRESOLINE) 10 MG tablet [id number] Yes TAKE 1 TABLET BY MOUTH TWICE  DAILY Jon Billings, NP Taking Active   insulin lispro (HUMALOG) 100 UNIT/ML KwikPen [id number] Yes INJECT 25 UNITS SUBCUTANEOUSLY TWICE DAILY BEFORE A MEAL Jon Billings, NP Taking Active   JARDIANCE 25 MG TABS tablet [id number] Yes TAKE 1 TABLET BY MOUTH DAILY  BEFORE Venancio Poisson, NP Taking Active   Lancets [facility] ULTRASOFT) lancets [id number]  Use to check blood sugar 2-3 times daily and document, bring results to provider visits. Jon Billings, NP  Active   lovastatin (MEVACOR) 40 MG tablet [id number] Yes TAKE 1 TABLET BY MOUTH DAILY  WITH Venancio Poisson, NP Taking Active   OneTouch Delica Lancets [address]O [place] [id number]  1 applicator by Does not apply route in the morning and at bedtime. Jon Billings, NP  Active   spironolactone (ALDACTONE) 25 MG tablet [id number] Yes TAKE 1 TABLET BY MOUTH DAILY Jon Billings, NP Taking Active   TRESIBA FLEXTOUCH 100 UNIT/ML FlexTouch Pen [id number] Yes INJECT 40 UNITS SUBCUTANEOUSLY ONCE DAILY Jon Billings, NP Taking Active             Patient Active Problem List   Diagnosis Date Noted   Osteopenia  01/01/2021   Vitamin D deficiency 10/18/2020   Vitamin B12 deficiency 10/18/2020   CKD (chronic kidney disease) stage 3, GFR 30-59 ml/min ([place]) 10/08/2020   Senile purpura ([place]) 04/10/2017   Morbid obesity ([place]) 04/10/2017   Advance care planning 04/10/2017   Noncompliance 10/30/2016   DM type 2 with diabetic peripheral neuropathy ([place])    Hyperlipidemia associated with type 2 diabetes mellitus ([place])    Hypertension associated with diabetes ([place])     Immunization History  Administered Date(s) Administered   Fluad Quad(high Dose 65+) 09/18/2020, 08/19/2021   Influenza, High Dose Seasonal PF 10/30/2016, 10/17/2017, 09/03/2018   Influenza,inj,Quad PF,6+ Mos 09/27/2015   Influenza-Unspecified 08/18/2014, 10/17/2017, 10/12/2019   PFIZER(Purple Top)SARS-COV-2 Vaccination 03/23/2020, 04/13/2020, 12/30/2020   Pneumococcal Conjugate-13 12/28/2014   Pneumococcal Polysaccharide-23 02/03/2022   Pneumococcal-Unspecified 12/30/2012   Td 05/23/2005, 03/14/2016   Zoster, Live 03/11/2014    Conditions to be addressed/monitored: HTN, HLD, DMII, and CKD Stage 3  Care Plan : [place]  Updates made by Lane Hacker, [place] since 07/10/2022 12:00 AM     Problem: DM2, HTN, HLD, Osteopenia, CKD,   Priority: High     Long-Range Goal: Disease Management   Start Date: 05/22/2022  Recent Progress: On track  Priority: High  Note:   Current Barriers:  Unable to achieve control of DM   Pharmacist Clinical Goal(s):  Patient will verbalize ability to afford treatment regimen achieve adherence to monitoring guidelines and medication adherence to achieve therapeutic efficacy through collaboration with PharmD and provider.   Interventions: 1:1 collaboration with Jon Billings, NP regarding development and update of comprehensive plan of care as evidenced by provider attestation and co-signature Inter-disciplinary care team collaboration (see longitudinal plan of care) Comprehensive  medication review performed; medication list updated in electronic medical record  Hypertension (BP goal <130/80) BP Readings from Last 3 Encounters:  05/05/22 138/74  02/03/22 123/69  10/17/21 133/73  Not ideally controlled -Current treatment: Hydralazine 10 mg twice daily Appropriate, Effective, Safe, Accessible Atenolol 100 mg once daily Appropriate, Effective, Safe, Accessible Spironolactone 25 mg once daily Appropriate, Effective, Safe, Accessible Amlodipine 10 mg once daily  Appropriate, Effective, Safe, Accessible -Medications previously tried: benazepril (angioedema), HCTZ (angioedema)  -Current home readings:  July 2023: 07/10/22: 145/80 -Current dietary habits:  -Current exercise habits:  -Denies hypotensive/hypertensive symptoms -Educated on BP goals and benefits of medications for prevention of heart attack, stroke and kidney damage; -Counseled to monitor BP at home 1-2x/wk, document, and provide log at future appointments -Counseled on diet and exercise extensively July 2023: Patient's BP was high but I could also hear her breathing heavily, speaking with me while BP being taken, and she was frustrated because she couldn't find machine. Will have Concierge call weekly and counseled patient to write down values every day.  Diabetes (A1c goal <8%) Lab Results  Component Value Date   HGBA1C 9.3 (H) 05/05/2022   HGBA1C 8.9 (H) 02/03/2022   HGBA1C 8.3 (H) 10/17/2021   Lab Results  Component Value Date   MICROALBUR 30 (H) 01/05/2021   LDLCALC 105 (H) 05/05/2022   CREATININE 0.93 05/05/2022   Lab Results  Component Value Date   NA 140 05/05/2022   K 4.1 05/05/2022   CREATININE 0.93 05/05/2022   EGFR 64 05/05/2022   GFRNONAA 63 01/05/2021   GLUCOSE 99 05/05/2022   Lab Results  Component Value Date   WBC 8.1 05/05/2022   HGB 14.3 05/05/2022   HCT 44.0 05/05/2022   MCV 80 05/05/2022   PLT 228 05/05/2022  -Uncontrolled -Current medications: Tresiba 540  units once daily  Appropriate, Query Effective Humalog 25 units twice daily  Appropriate, Query Effective Trulicity 4.5 mg once weekly on Wednesday  Appropriate, Query Effective Jaridance 25 mg once daily  Appropriate, Query Effective -Medications previously tried: metformin (GI issues), actos, Januvia (trulicity transition), Onglyza  -Current home glucose readings fasting glucose: not testing post prandial glucose: not testing -Denies hypoglycemic/hyperglycemic symptoms -Current meal patterns: mashed potatoes, beans, fried chicken. Some sweets. Sliced ham. No greens. Some fruits, strawberries, apples, bananas. Bowl of cereal for breakfast. Diet soda (1 big bottle per day b/w her and grand dtr), no tea. -Current exercise: walks with grand dtr daily  -Educated on A1c and blood sugar goals; July 2023: Patient hasn't picked up Trulicity since Jan. Coordinated with Vinnie Level at [facility] and will get script ready to be picked up this afternoon for daughter to pick up. Karen sent in new/lower dose to prevent ADR's. Made sure Vinnie Level at [facility] has the Pharmacist [employer]* when dispensing -Patient is a very poor historian. Doesn't know what medications are for. When speaking to her about her veins she stated, "I don't know what veins even look like. I've never looked at them before." When speaking about "Maple syrup" she stated, "I don't know what Maple Syrup is." Patient kept stating, "I'm very healthy, I feel great." Plan: Will have CCM team reach out to patient weekly       Patient Goals/Self-Care Activities Patient will:  - take medications as prescribed as evidenced by patient report and record review target a minimum of 150 minutes of moderate intensity exercise weekly   Medication Assistance: None required.  Patient affirms current coverage meets needs.  Patient's preferred pharmacy is:  [facility] [address] [place] - [place]  [address] [place]  [address] Phone: [phone] Fax: [phone]  [facility] ([facility] ) - [place], [place] [address] Phone: [phone] Fax: [facility] [address] [place] - [address] [place] [address] Phone: [phone] Fax: [phone]  Follow Up:  Patient agrees to Care Plan and Follow-up.  Plan:   CPP F/U October, CCM f/u weekly  Future Appointments  Date Time Provider [place]  08/07/2022  3:00 PM Jon Billings, NP [facility]FP-[facility]FP [facility]  09/25/2022  1:00 PM CFP CCM PHARMACY CFP-CFP [facility]   [place], Pharm.D. - [phone]

## 2022-07-10 NOTE — Telephone Encounter (Signed)
Patient hasn't picked up medication since January.  Daughter will be picking up medications today.  Will decrease dose back to .'75mg'$  and increase to 1.'5mg'$  at next visit.

## 2022-07-12 NOTE — Telephone Encounter (Signed)
Requested medication (s) are due for refill today: no  Requested medication (s) are on the active medication list: no  Last refill:  10/18/21  Future visit scheduled: yes  Notes to clinic:  Unable to refill per protocol, Rx expired. Medication was discontinued 10/18/21 by PCP.     Requested Prescriptions  Pending Prescriptions Disp Refills   TRULICITY 3 XB/1.4NW SOPN [Pharmacy Med Name: Trulicity 3 GN/5.6OZ Subcutaneous Solution Pen-injector] 12 mL 0    Sig: Inject 3 mg as directed once a week.     Endocrinology:  Diabetes - GLP-1 Receptor Agonists Failed - 07/10/2022  2:41 PM      Failed - HBA1C is between 0 and 7.9 and within 180 days    Hemoglobin A1C  Date Value Ref Range Status  07/13/2016 7.9  Final   HB A1C (BAYER DCA - WAIVED)  Date Value Ref Range Status  01/05/2021 9.4 (H) <7.0 % Final    Comment:                                          Diabetic Adult            <7.0                                       Healthy Adult        4.3 - 5.7                                                           (DCCT/NGSP) American Diabetes Association's Summary of Glycemic Recommendations for Adults with Diabetes: Hemoglobin A1c <7.0%. More stringent glycemic goals (A1c <6.0%) may further reduce complications at the cost of increased risk of hypoglycemia.    Hgb A1c MFr Bld  Date Value Ref Range Status  05/05/2022 9.3 (H) 4.8 - 5.6 % Final    Comment:             Prediabetes: 5.7 - 6.4          Diabetes: >6.4          Glycemic control for adults with diabetes: <7.0          Passed - Valid encounter within last 6 months    Recent Outpatient Visits           2 months ago Annual physical exam   [facility] Jon Billings, NP   5 months ago Hypertension associated with diabetes [facility])   [facility] Jon Billings, NP   8 months ago Hypertension associated with diabetes [facility])   [facility] Jon Billings, NP   11 months ago  Hypertension associated with diabetes [facility])   [facility] Jon Billings, NP   1 year ago Hypertension associated with diabetes [facility])   [place], Karen, NP       Future Appointments             In 3 weeks Jon Billings, NP [facility], [place]

## 2022-07-17 DIAGNOSIS — E1142 Type 2 diabetes mellitus with diabetic polyneuropathy: Secondary | ICD-10-CM

## 2022-07-17 DIAGNOSIS — E785 Hyperlipidemia, unspecified: Secondary | ICD-10-CM | POA: Diagnosis not present

## 2022-07-17 DIAGNOSIS — I152 Hypertension secondary to endocrine disorders: Secondary | ICD-10-CM

## 2022-07-17 DIAGNOSIS — E1159 Type 2 diabetes mellitus with other circulatory complications: Secondary | ICD-10-CM

## 2022-07-17 DIAGNOSIS — E1169 Type 2 diabetes mellitus with other specified complication: Secondary | ICD-10-CM | POA: Diagnosis not present

## 2022-08-01 ENCOUNTER — Telehealth: Payer: Self-pay

## 2022-08-01 NOTE — Telephone Encounter (Signed)
Copied from [place] [id number]. Topic: General - Other >> Aug 01, 2022  1:51 PM Leitha Schuller wrote: Reason for CRM: Caller inquiring if fax for medical supplies was received today 8-15  Caller states provider signature is required and forms can be faxed back to the number on the cover sheet  Please assist further

## 2022-08-01 NOTE — Telephone Encounter (Signed)
Attempted to return call to let patient know I have not received anything about medical supplies for patient. Unable to LVM.

## 2022-08-04 ENCOUNTER — Telehealth: Payer: Self-pay

## 2022-08-04 NOTE — Chronic Care Management (AMB) (Signed)
Chronic Care Management Pharmacy Assistant   Name: Tami Cummings  MRN: [id number] DOB: 01/23/1946   Reason for Encounter: Disease State Diabetes Mellitus     Recent office visits:  None noted  Recent consult visits:  None noted  Hospital visits:  None in previous 6 months  Medications: Outpatient Encounter Medications as of 08/04/2022  Medication Sig   amLODipine (NORVASC) 10 MG tablet Take 1 tablet (10 mg total) by mouth daily.   atenolol (TENORMIN) 100 MG tablet Take 1 tablet (100 mg total) by mouth daily.   BD PEN NEEDLE MICRO U/F 32G X 6 MM MISC USE 4 TO 5 TIMES DAILY WITH INSULIN USE   Blood Glucose Monitoring Suppl (ONETOUCH VERIO) w/Device KIT Use to check blood sugar 2-3 times daily and document, bring results to provider visits.   Dulaglutide (TRULICITY) 5.59 RC/1.6LA SOPN Inject 0.75 mg into the skin once a week. (Patient not taking: Reported on 07/10/2022)   glucose blood test strip Use to check blood sugar 2-3 times daily and document, bring results to provider visits.   hydrALAZINE (APRESOLINE) 10 MG tablet TAKE 1 TABLET BY MOUTH TWICE  DAILY   insulin lispro (HUMALOG) 100 UNIT/ML KwikPen INJECT 25 UNITS SUBCUTANEOUSLY TWICE DAILY BEFORE A MEAL   JARDIANCE 25 MG TABS tablet TAKE 1 TABLET BY MOUTH DAILY  BEFORE BREAKFAST   Lancets (ONETOUCH ULTRASOFT) lancets Use to check blood sugar 2-3 times daily and document, bring results to provider visits.   lovastatin (MEVACOR) 40 MG tablet TAKE 1 TABLET BY MOUTH DAILY  WITH BREAKFAST   OneTouch Delica Lancets 45X MISC 1 applicator by Does not apply route in the morning and at bedtime.   spironolactone (ALDACTONE) 25 MG tablet TAKE 1 TABLET BY MOUTH DAILY   TRESIBA FLEXTOUCH 100 UNIT/ML FlexTouch Pen INJECT 40 UNITS SUBCUTANEOUSLY ONCE DAILY   No facility-administered encounter medications on file as of 08/04/2022.    Recent Relevant Labs: Lab Results  Component Value Date/Time   HGBA1C 9.3 (H) 05/05/2022 02:31 PM    HGBA1C 8.9 (H) 02/03/2022 02:07 PM   HGBA1C 9.4 (H) 01/05/2021 11:12 AM   HGBA1C 7.8 (H) 12/04/2018 11:06 AM   HGBA1C 7.9 07/13/2016 12:00 AM   MICROALBUR 30 (H) 01/05/2021 11:12 AM   MICROALBUR 10 10/15/2020 02:59 PM    Kidney Function Lab Results  Component Value Date/Time   CREATININE 0.93 05/05/2022 02:31 PM   CREATININE 0.91 02/03/2022 02:07 PM   GFRNONAA 63 01/05/2021 11:14 AM   GFRAA 72 01/05/2021 11:14 AM    Current antihyperglycemic regimen:  Trulicity 6.46 mg inject 0.75 mg weekly Humalog 100 units inject 25 units twice daily Jardiance 25 mg take 1 tab daily Tresiba 100 unit inject 40 units once daily  Patient verbally confirms she is taking the above medications as directed. Yes  What recent interventions/DTPs have been made to improve glycemic control:  None noted  Have there been any recent hospitalizations or ED visits since last visit with CPP? No  Patient denies hypoglycemic symptoms, including None  Patient denies hyperglycemic symptoms, including none  How often are you checking your blood sugar?        Patient states she has not been checking her BG due to her not knowing how to use the machine. I recommended for the patient that she can go to her PCP office to get assistance on how to use the BG monitor.   What are your blood sugars ranging?  Fasting: N/a Before meals: N/a  After meals: N/a Bedtime: N/a  On insulin? Yes How many units: Humalog 25 units twice daily and Tresiba 40 units daily  During the week, how often does your blood glucose drop below 70? N/a patient does not know how to use her blood glucose machine she will go to her PCP office to get assistance.  Are you checking your feet daily/regularly?       Patient states she does check her feet daily.  Care Gaps: Last eye exam / Retinopathy Screening:04/26/2022 Last Annual Wellness Visit:05/05/2022 Last Diabetic Foot Exam:05/05/2022  Adherence Review: Is the patient currently on  a STATIN medication? Yes Is the patient currently on ACE/ARB medication? Yes Does the patient have >5 day gap between last estimated fill dates? Yes   Star Medications Jardiance 25 mg Last filled:04/04/22 90 DS Atenolol 100 mg Last XBLTJQ:30/09/23 300 DS Trulicity 7.62 mg Last UQJFHL:45/62/56 28 DS Lovastatin 40 mg Last filled:06/20/22 100 DS  Corrie Mckusick, [place]

## 2022-08-07 ENCOUNTER — Ambulatory Visit: Payer: 59 | Admitting: Nurse Practitioner

## 2022-08-08 ENCOUNTER — Telehealth: Payer: Self-pay

## 2022-08-08 ENCOUNTER — Ambulatory Visit (INDEPENDENT_AMBULATORY_CARE_PROVIDER_SITE_OTHER): Payer: 59 | Admitting: *Deleted

## 2022-08-08 DIAGNOSIS — Z0000 Encounter for general adult medical examination without abnormal findings: Secondary | ICD-10-CM | POA: Diagnosis not present

## 2022-08-08 NOTE — Telephone Encounter (Signed)
Noted  

## 2022-08-08 NOTE — Telephone Encounter (Signed)
Pt returned call to report that she does not need diabetic shoes.

## 2022-08-08 NOTE — Telephone Encounter (Signed)
Attempted to call patient to let her know in order to sign off on her diabetic footwear rx , a MD must see her in the office. Unable to LVM.

## 2022-08-08 NOTE — Patient Instructions (Signed)
[name] , Thank you for taking time to come for your Medicare Wellness Visit. I appreciate your ongoing commitment to your health goals. Please review the following plan we discussed and let me know if I can assist you in the future.   Screening recommendations/referrals: Colonoscopy: Education provided Mammogram: Education provided Bone Density: Education provided Recommended yearly ophthalmology/optometry visit for glaucoma screening and checkup Recommended yearly dental visit for hygiene and checkup  Vaccinations: Influenza vaccine: up to date Pneumococcal vaccine: up to date Tdap vaccine: up to date Shingles vaccine: Education provided    Advanced directives: Education provided  Conditions/risks identified:   Next appointment: 08-15-2022 @ 4:20   [facility] 75 Years and Older, Female Preventive care refers to lifestyle choices and visits with your health care provider that can promote health and wellness. What does preventive care include? A yearly physical exam. This is also called an annual well check. Dental exams once or twice a year. Routine eye exams. Ask your health care provider how often you should have your eyes checked. Personal lifestyle choices, including: Daily care of your teeth and gums. Regular physical activity. Eating a healthy diet. Avoiding tobacco and drug use. Limiting alcohol use. Practicing safe sex. Taking low-dose aspirin every day. Taking vitamin and mineral supplements as recommended by your health care provider. What happens during an annual well check? The services and screenings done by your health care provider during your annual well check will depend on your age, overall health, lifestyle risk factors, and family history of disease. Counseling  Your health care provider may ask you questions about your: Alcohol use. Tobacco use. Drug use. Emotional well-being. Home and relationship well-being. Sexual  activity. Eating habits. History of falls. Memory and ability to understand (cognition). Work and work Statistician. Reproductive health. Screening  You may have the following tests or measurements: Height, weight, and BMI. Blood pressure. Lipid and cholesterol levels. These may be checked every 5 years, or more frequently if you are over 75 years old. Skin check. Lung cancer screening. You may have this screening every year starting at age 75 if you have a 30-pack-year history of smoking and currently smoke or have quit within the past 15 years. Fecal occult blood test (FOBT) of the stool. You may have this test every year starting at age 75. Flexible sigmoidoscopy or colonoscopy. You may have a sigmoidoscopy every 5 years or a colonoscopy every 10 years starting at age 75. Hepatitis C blood test. Hepatitis B blood test. Sexually transmitted disease (STD) testing. Diabetes screening. This is done by checking your blood sugar (glucose) after you have not eaten for a while (fasting). You may have this done every 1-3 years. Bone density scan. This is done to screen for osteoporosis. You may have this done starting at age 75. Mammogram. This may be done every 1-2 years. Talk to your health care provider about how often you should have regular mammograms. Talk with your health care provider about your test results, treatment options, and if necessary, the need for more tests. Vaccines  Your health care provider may recommend certain vaccines, such as: Influenza vaccine. This is recommended every year. Tetanus, diphtheria, and acellular pertussis (Tdap, Td) vaccine. You may need a Td booster every 10 years. Zoster vaccine. You may need this after age 75. Pneumococcal 13-valent conjugate (PCV13) vaccine. One dose is recommended after age 75. Pneumococcal polysaccharide (PPSV23) vaccine. One dose is recommended after age 75. Talk to your health care provider about  which screenings and vaccines  you need and how often you need them. This information is not intended to replace advice given to you by your health care provider. Make sure you discuss any questions you have with your health care provider. Document Released: 12/31/2015 Document Revised: 08/23/2016 Document Reviewed: 10/05/2015 Elsevier Interactive Patient Education  2017 [employer] Prevention in the Home Falls can cause injuries. They can happen to people of all ages. There are many things you can do to make your home safe and to help prevent falls. What can I do on the outside of my home? Regularly fix the edges of walkways and driveways and fix any cracks. Remove anything that might make you trip as you walk through a door, such as a raised step or threshold. Trim any bushes or trees on the path to your home. Use bright outdoor lighting. Clear any walking paths of anything that might make someone trip, such as rocks or tools. Regularly check to see if handrails are loose or broken. Make sure that both sides of any steps have handrails. Any raised decks and porches should have guardrails on the edges. Have any leaves, snow, or ice cleared regularly. Use sand or salt on walking paths during winter. Clean up any spills in your garage right away. This includes oil or grease spills. What can I do in the bathroom? Use night lights. Install grab bars by the toilet and in the tub and shower. Do not use towel bars as grab bars. Use non-skid mats or decals in the tub or shower. If you need to sit down in the shower, use a plastic, non-slip stool. Keep the floor dry. Clean up any water that spills on the floor as soon as it happens. Remove soap buildup in the tub or shower regularly. Attach bath mats securely with double-sided non-slip rug tape. Do not have throw rugs and other things on the floor that can make you trip. What can I do in the bedroom? Use night lights. Make sure that you have a light by your bed that  is easy to reach. Do not use any sheets or blankets that are too big for your bed. They should not hang down onto the floor. Have a firm chair that has side arms. You can use this for support while you get dressed. Do not have throw rugs and other things on the floor that can make you trip. What can I do in the kitchen? Clean up any spills right away. Avoid walking on wet floors. Keep items that you use a lot in easy-to-reach places. If you need to reach something above you, use a strong step stool that has a grab bar. Keep electrical cords out of the way. Do not use floor polish or wax that makes floors slippery. If you must use wax, use non-skid floor wax. Do not have throw rugs and other things on the floor that can make you trip. What can I do with my stairs? Do not leave any items on the stairs. Make sure that there are handrails on both sides of the stairs and use them. Fix handrails that are broken or loose. Make sure that handrails are as long as the stairways. Check any carpeting to make sure that it is firmly attached to the stairs. Fix any carpet that is loose or worn. Avoid having throw rugs at the top or bottom of the stairs. If you do have throw rugs, attach them to the floor with  carpet tape. Make sure that you have a light switch at the top of the stairs and the bottom of the stairs. If you do not have them, ask someone to add them for you. What else can I do to help prevent falls? Wear shoes that: Do not have high heels. Have rubber bottoms. Are comfortable and fit you well. Are closed at the toe. Do not wear sandals. If you use a stepladder: Make sure that it is fully opened. Do not climb a closed stepladder. Make sure that both sides of the stepladder are locked into place. Ask someone to hold it for you, if possible. Clearly mark and make sure that you can see: Any grab bars or handrails. First and last steps. Where the edge of each step is. Use tools that help you  move around (mobility aids) if they are needed. These include: Canes. Walkers. Scooters. Crutches. Turn on the lights when you go into a dark area. Replace any light bulbs as soon as they burn out. Set up your furniture so you have a clear path. Avoid moving your furniture around. If any of your floors are uneven, fix them. If there are any pets around you, be aware of where they are. Review your medicines with your doctor. Some medicines can make you feel dizzy. This can increase your chance of falling. Ask your doctor what other things that you can do to help prevent falls. This information is not intended to replace advice given to you by your health care provider. Make sure you discuss any questions you have with your health care provider. Document Released: 09/30/2009 Document Revised: 05/11/2016 Document Reviewed: 01/08/2015 Elsevier Interactive Patient Education  2017 [employer].

## 2022-08-08 NOTE — Progress Notes (Signed)
Subjective:   Tami Cummings is a 76 y.o. female who presents for Medicare Annual (Subsequent) preventive examination.  I connected with  Tami Cummings on 08/08/22 by a  telephone enabled telemedicine application and verified that I am speaking with the correct person using two identifiers.   I discussed the limitations of evaluation and management by telemedicine. The patient expressed understanding and agreed to proceed.  Patient location: home  Provider location: Tele-health-home    Review of Systems     Cardiac Risk Factors include: diabetes mellitus;advanced age (>64mn, >>89women);family history of premature cardiovascular disease;obesity (BMI >30kg/m2);sedentary lifestyle;hypertension     Objective:    Today's Vitals   There is no height or weight on file to calculate BMI.     08/08/2022   10:07 AM 06/10/2021    9:52 AM 05/19/2021    1:27 PM 05/27/2020    9:46 AM 05/26/2019    8:57 AM 06/03/2018    7:14 AM 05/23/2018    9:55 AM  Advanced Directives  Does Patient Have a Medical Advance Directive? No Yes No Yes Yes Yes No  Type of ASocial research officer, government Living will;Healthcare Power of ARealLiving will   Does patient want to make changes to medical advance directive?      No - Patient declined   Copy of [place] Chart?  No - copy requested  No - copy requested  No - copy requested   Would patient like information on creating a medical advance directive? No - Patient declined      No - Patient declined    Current Medications (verified) Outpatient Encounter Medications as of 08/08/2022  Medication Sig   amLODipine (NORVASC) 10 MG tablet Take 1 tablet (10 mg total) by mouth daily.   atenolol (TENORMIN) 100 MG tablet Take 1 tablet (100 mg total) by mouth daily.   BD PEN NEEDLE MICRO U/F 32G X 6 MM MISC USE 4 TO 5 TIMES DAILY WITH INSULIN USE   Blood Glucose Monitoring Suppl  (ONETOUCH VERIO) w/Device KIT Use to check blood sugar 2-3 times daily and document, bring results to provider visits.   glucose blood test strip Use to check blood sugar 2-3 times daily and document, bring results to provider visits.   hydrALAZINE (APRESOLINE) 10 MG tablet TAKE 1 TABLET BY MOUTH TWICE  DAILY   insulin lispro (HUMALOG) 100 UNIT/ML KwikPen INJECT 25 UNITS SUBCUTANEOUSLY TWICE DAILY BEFORE A MEAL   JARDIANCE 25 MG TABS tablet TAKE 1 TABLET BY MOUTH DAILY  BEFORE BREAKFAST   Lancets (ONETOUCH ULTRASOFT) lancets Use to check blood sugar 2-3 times daily and document, bring results to provider visits.   lovastatin (MEVACOR) 40 MG tablet TAKE 1 TABLET BY MOUTH DAILY  WITH BREAKFAST   OneTouch Delica Lancets 341YMISC 1 applicator by Does not apply route in the morning and at bedtime.   spironolactone (ALDACTONE) 25 MG tablet TAKE 1 TABLET BY MOUTH DAILY   TRESIBA FLEXTOUCH 100 UNIT/ML FlexTouch Pen INJECT 40 UNITS SUBCUTANEOUSLY ONCE DAILY   Dulaglutide (TRULICITY) 06.06MTK/1.6WFSOPN Inject 0.75 mg into the skin once a week. (Patient not taking: Reported on 07/10/2022)   No facility-administered encounter medications on file as of 08/08/2022.    Allergies (verified) Actos [pioglitazone], Amlodipine, Benazepril, Hydrochlorothiazide, and Metformin and related   History: Past Medical History:  Diagnosis Date   Arthritis    knee   COPD (chronic obstructive  pulmonary disease) ([place])    Diabetes mellitus without complication ([place])    type 2   Dyspnea    Hyperlipidemia    Hypertension    controlled   Neuromuscular disorder (HCC)    numbness in hands and feet   Shoulder pain, right    Wears dentures    upper and lower   Past Surgical History:  Procedure Laterality Date   BREAST BIOPSY Right 04/12/2016   stereo bx/clip- neg   CARPAL TUNNEL RELEASE  06/17/2000   CATARACT EXTRACTION W/PHACO Left 09/20/2015   Procedure: CATARACT EXTRACTION PHACO AND INTRAOCULAR LENS PLACEMENT  ([place]);  Surgeon: Ronnell Freshwater, MD;  Location: [place];  Service: Ophthalmology;  Laterality: Left;  DIABETIC - insulin   CATARACT EXTRACTION W/PHACO Right 06/03/2018   Procedure: CATARACT EXTRACTION PHACO AND INTRAOCULAR LENS PLACEMENT ([place]) RIGHT DIABETES;  Surgeon: Eulogio Bear, MD;  Location: [facility];  Service: Ophthalmology;  Laterality: Right;  Diabetes-insulin dependent   CESAREAN SECTION     x2   CHOLECYSTECTOMY     EYE SURGERY     TOTAL ABDOMINAL HYSTERECTOMY W/ BILATERAL SALPINGOOPHORECTOMY     Family History  Problem Relation Age of Onset   Heart disease Father    Cancer Sister    Cancer Brother    Breast cancer Neg Hx    Social History   Socioeconomic History   Marital status: Single    Spouse name: Not on file   Number of children: Not on file   Years of education: Not on file   Highest education level: High school graduate  Occupational History   Occupation: retired   Tobacco Use   Smoking status: Former    Packs/day: 1.00    Years: 20.00    Total pack years: 20.00    Types: Cigarettes    Quit date: 08/26/2013    Years since quitting: 8.9   Smokeless tobacco: Never  Vaping Use   Vaping Use: Never used  Substance and Sexual Activity   Alcohol use: No    Alcohol/week: 0.0 standard drinks of alcohol   Drug use: No   Sexual activity: Not Currently  Other Topics Concern   Not on file  Social History Narrative   Daughter lives with her currently    Takes care of granddaughter    Social Determinants of Health   Financial Resource Strain: Low Risk  (08/08/2022)   Overall Financial Resource Strain (CARDIA)    Difficulty of Paying Living Expenses: Not hard at all  Food Insecurity: No Food Insecurity (08/08/2022)   Hunger Vital Sign    Worried About Running Out of Food in the Last Year: Never true    [place] in the Last Year: Never true  Transportation Needs: No Transportation Needs (05/22/2022)   PRAPARE -  Hydrologist (Medical): No    Lack of Transportation (Non-Medical): No  Physical Activity: Inactive (08/08/2022)   Exercise Vital Sign    Days of Exercise per Week: 0 days    Minutes of Exercise per Session: 0 min  Stress: No Stress Concern Present (08/08/2022)   [place]    Feeling of Stress : Not at all  Social Connections: Socially Isolated (08/08/2022)   Social Connection and Isolation Panel [NHANES]    Frequency of Communication with Friends and Family: Twice a week    Frequency of Social Gatherings with Friends and Family: Once a week  Attends Religious Services: Never    Active Member of Clubs or Organizations: No    Attends Archivist Meetings: Never    Marital Status: Divorced    Tobacco Counseling Counseling given: Not Answered   Clinical Intake:  Pre-visit preparation completed: Yes  Pain : No/denies pain     Nutritional Risks: None Diabetes: Yes CBG done?: No Did pt. bring in CBG monitor from home?: No  How often do you need to have someone help you when you read instructions, pamphlets, or other written materials from your doctor or pharmacy?: 1 - Never  Diabetic?  Yes  Nutrition Risk Assessment:  Has the patient had any N/V/D within the last 2 months?  No  Does the patient have any non-healing wounds?  No  Has the patient had any unintentional weight loss or weight gain?  No   Diabetes:  Is the patient diabetic?  Yes  If diabetic, was a CBG obtained today?  No  Did the patient bring in their glucometer from home?  No  How often do you monitor your CBG's? .  Does not check   Financial Strains and Diabetes Management:  Are you having any financial strains with the device, your supplies or your medication? No .  Does the patient want to be seen by Chronic Care Management for management of their diabetes?  No  Would the patient like to be  referred to a Nutritionist or for Diabetic Management?  No   Diabetic Exams:  Diabetic Eye Exam: . Overdue for diabetic eye exam. Pt has been advised about the importance in completing this exam  Diabetic Foot Exam: Pt has been advised about the importance in completing this exam  Interpreter Needed?: No  Information entered by :: Leroy Kennedy LPN   Activities of Daily Living    08/08/2022   10:11 AM  In your present state of health, do you have any difficulty performing the following activities:  Hearing? 0  Vision? 0  Difficulty concentrating or making decisions? 0  Walking or climbing stairs? 0  Dressing or bathing? 0  Doing errands, shopping? 1  Comment daughter takes her  Conservation officer, nature and eating ? N  Using the Toilet? N  In the past six months, have you accidently leaked urine? Y  Do you have problems with loss of bowel control? N  Managing your Medications? N  Managing your Finances? N  Housekeeping or managing your Housekeeping? N    Patient Care Team: Jon Billings, NP as PCP - Lolly Mustache, MD (Ophthalmology) Lane Hacker, [facility] (Pharmacist)  Indicate any recent Medical Services you may have received from other than [facility] providers in the past year (date may be approximate).     Assessment:   This is a routine wellness examination for Tami Cummings.  Hearing/Vision screen Hearing Screening - Comments:: No trouble hearing Vision Screening - Comments:: Not up to date  Dietary issues and exercise activities discussed: Current Exercise Habits: The patient does not participate in regular exercise at present   Goals Addressed             This Visit's Progress    Patient Stated       Stay alive        Depression Screen    08/08/2022   10:10 AM 05/05/2022    2:18 PM 02/03/2022    1:36 PM 10/17/2021   10:05 AM 06/10/2021    9:53 AM 01/05/2021   11:08 AM 05/27/2020  9:48 AM  PHQ 2/9 Scores  PHQ - 2 Score 0 0 0 0 0 0 0  PHQ- 9 Score   1 0 0       Fall Risk    08/08/2022   10:07 AM 05/05/2022    2:18 PM 02/03/2022    1:36 PM 10/17/2021   10:04 AM 06/10/2021    9:53 AM  Fall Risk   Falls in the past year? 0 0 0 0 0  Number falls in past yr: 0 0 0    Injury with Fall? 0 0 0 0   Risk for fall due to :  No Fall Risks No Fall Risks  Medication side effect  Follow up Falls evaluation completed;Education provided;Falls prevention discussed Falls evaluation completed Falls evaluation completed  Falls evaluation completed;Education provided;Falls prevention discussed    FALL RISK PREVENTION PERTAINING TO THE HOME:  Any stairs in or around the home? Yes  If so, are there any without handrails? No  Home free of loose throw rugs in walkways, pet beds, electrical cords, etc? Yes  Adequate lighting in your home to reduce risk of falls? Yes   ASSISTIVE DEVICES UTILIZED TO PREVENT FALLS:  Life alert? No  Use of a cane, walker or w/c? Yes  Grab bars in the bathroom? No  Shower chair or bench in shower? No  Elevated toilet seat or a handicapped toilet? No   TIMED UP AND GO:  Was the test performed? No .    Cognitive Function:    02/06/2022    8:54 AM  MMSE - Mini Mental State Exam  Orientation to time 4  Orientation to Place 5  Registration 3  Attention/ Calculation 5  Recall 3  Language- name 2 objects 2  Language- repeat 1  Language- follow 3 step command 3  Language- read & follow direction 1  Write a sentence 1  Copy design 0  Total score 28        08/08/2022   10:05 AM 06/10/2021    9:54 AM 05/26/2019    8:58 AM 05/23/2018    9:58 AM  6CIT Screen  What Year? 4 points 0 points 0 points 0 points  What month? 0 points 0 points 0 points 0 points  What time? 0 points 0 points 0 points 0 points  Count back from 20 0 points 0 points 0 points 0 points  Months in reverse 4 points 4 points 0 points 0 points  Repeat phrase 4 points 2 points 2 points 2 points  Total Score 12 points 6 points 2 points 2 points     Immunizations Immunization History  Administered Date(s) Administered   Fluad Quad(high Dose 65+) 09/18/2020, 08/19/2021   Influenza, High Dose Seasonal PF 10/30/2016, 10/17/2017, 09/03/2018   Influenza,inj,Quad PF,6+ Mos 09/27/2015   Influenza-Unspecified 08/18/2014, 10/17/2017, 10/12/2019   PFIZER(Purple Top)SARS-COV-2 Vaccination 03/23/2020, 04/13/2020, 12/30/2020   Pneumococcal Conjugate-13 12/28/2014   Pneumococcal Polysaccharide-23 02/03/2022   Pneumococcal-Unspecified 12/30/2012   Td 05/23/2005, 03/14/2016   Zoster, Live 03/11/2014    TDAP status: Up to date  Flu Vaccine status: Up to date  Pneumococcal vaccine status: Up to date  Covid-19 vaccine status: Information provided on how to obtain vaccines.   Qualifies for Shingles Vaccine? Yes   Zostavax completed Yes   Shingrix Completed?: No.    Education has been provided regarding the importance of this vaccine. Patient has been advised to call insurance company to determine out of pocket expense if they have not yet  received this vaccine. Advised may also receive vaccine at local pharmacy or Health Dept. Verbalized acceptance and understanding.  Screening Tests Health Maintenance  Topic Date Due   COLON CANCER SCREENING ANNUAL FOBT  01/06/2022   OPHTHALMOLOGY EXAM  04/26/2022   INFLUENZA VACCINE  07/18/2022   COVID-19 Vaccine (4 - Pfizer series) 08/24/2022 (Originally 02/24/2021)   Zoster Vaccines- Shingrix (1 of 2) 11/08/2022 (Originally 08/04/1997)   HEMOGLOBIN A1C  11/05/2022   FOOT EXAM  05/06/2023   COLONOSCOPY (Pts 45-35yr Insurance coverage will need to be confirmed)  10/24/2025   TETANUS/TDAP  03/14/2026   Pneumonia Vaccine 75 Years old  Completed   DEXA SCAN  Completed   Hepatitis C Screening  Completed   HPV VACCINES  Aged Out    Health Maintenance  Health Maintenance Due  Topic Date Due   COLON CANCER SCREENING ANNUAL FOBT  01/06/2022   OPHTHALMOLOGY EXAM  04/26/2022   INFLUENZA VACCINE   07/18/2022    Colorectal cancer screening: No longer required.   Mammogram status: Ordered  . Pt provided with contact info and advised to call to schedule appt.   Bone Density status: Completed 2017. Results reflect: Bone density results: OSTEOPENIA. Repeat every 5 years. Declined  Lung Cancer Screening: (Low Dose CT Chest recommended if Age 75-80years, 30 pack-year currently smoking OR have quit w/in 15years.) does qualify.     Lung Cancer Screening Referral: Declined  Additional Screening:  Hepatitis C Screening:  qualify; Completed 2017  Vision Screening: Recommended annual ophthalmology exams for early detection of glaucoma and other disorders of the eye. Is the patient up to date with their annual eye exam?  No  Who is the provider or what is the name of the office in which the patient attends annual eye exams?  If pt is not established with a provider, would they like to be referred to a provider to establish care? No .   Dental Screening: Recommended annual dental exams for proper oral hygiene  Community Resource Referral / Chronic Care Management: CRR required this visit?  No   CCM required this visit?  No      Plan:     I have personally reviewed and noted the following in the patient's chart:   Medical and social history Use of alcohol, tobacco or illicit drugs  Current medications and supplements including opioid prescriptions. Patient is not currently taking opioid prescriptions. Functional ability and status Nutritional status Physical activity Advanced directives List of other physicians Hospitalizations, surgeries, and ER visits in previous 12 months Vitals Screenings to include cognitive, depression, and falls Referrals and appointments  In addition, I have reviewed and discussed with patient certain preventive protocols, quality metrics, and best practice recommendations. A written personalized care plan for preventive services as well as general  preventive health recommendations were provided to patient.     JLeroy Kennedy LPN   [id number]  Nurse Notes:

## 2022-08-10 ENCOUNTER — Ambulatory Visit: Payer: 59 | Admitting: Nurse Practitioner

## 2022-08-13 ENCOUNTER — Other Ambulatory Visit: Payer: Self-pay | Admitting: Nurse Practitioner

## 2022-08-14 NOTE — Telephone Encounter (Signed)
  Notes to clinic:  Pt has appt tomorrow, has cancelled many appts, please assess.      Requested Prescriptions  Pending Prescriptions Disp Refills   Insulin Degludec FlexTouch 100 UNIT/ML SOPN [Pharmacy Med Name: Insulin Degludec FlexTouch 100 UNIT/ML Subcutaneous Solution Pen-injector] 15 mL 0    Sig: INJECT 40 UNITS SUBCUTANEOUSLY ONCE DAILY     Endocrinology:  Diabetes - Insulins Failed - 08/13/2022  5:02 PM      Failed - HBA1C is between 0 and 7.9 and within 180 days    Hemoglobin A1C  Date Value Ref Range Status  07/13/2016 7.9  Final   HB A1C (BAYER DCA - WAIVED)  Date Value Ref Range Status  01/05/2021 9.4 (H) <7.0 % Final    Comment:                                          Diabetic Adult            <7.0                                       Healthy Adult        4.3 - 5.7                                                           (DCCT/NGSP) American Diabetes Association's Summary of Glycemic Recommendations for Adults with Diabetes: Hemoglobin A1c <7.0%. More stringent glycemic goals (A1c <6.0%) may further reduce complications at the cost of increased risk of hypoglycemia.    Hgb A1c MFr Bld  Date Value Ref Range Status  05/05/2022 9.3 (H) 4.8 - 5.6 % Final    Comment:             Prediabetes: 5.7 - 6.4          Diabetes: >6.4          Glycemic control for adults with diabetes: <7.0          Passed - Valid encounter within last 6 months    Recent Outpatient Visits           3 months ago Annual physical exam   [facility] Jon Billings, NP   6 months ago Hypertension associated with diabetes [facility])   [facility] Jon Billings, NP   10 months ago Hypertension associated with diabetes [facility])   [facility] Jon Billings, NP   12 months ago Hypertension associated with diabetes [facility])   [facility] Jon Billings, NP   1 year ago Hypertension associated with diabetes ([place])   [place], Karen, NP       Future Appointments             Tomorrow Valerie Roys, DO [place], [place]   In 3 days Wynetta Emery, Barb Merino, DO [employer], [facility]

## 2022-08-15 ENCOUNTER — Encounter: Payer: Self-pay | Admitting: Family Medicine

## 2022-08-15 ENCOUNTER — Ambulatory Visit: Payer: 59 | Admitting: Family Medicine

## 2022-08-15 ENCOUNTER — Ambulatory Visit (INDEPENDENT_AMBULATORY_CARE_PROVIDER_SITE_OTHER): Payer: 59 | Admitting: Family Medicine

## 2022-08-15 VITALS — BP 121/72 | HR 87 | Temp 98.1°F | Wt 194.4 lb

## 2022-08-15 DIAGNOSIS — E559 Vitamin D deficiency, unspecified: Secondary | ICD-10-CM

## 2022-08-15 DIAGNOSIS — E1142 Type 2 diabetes mellitus with diabetic polyneuropathy: Secondary | ICD-10-CM

## 2022-08-15 LAB — MICROALBUMIN, URINE WAIVED
Creatinine, Urine Waived: 50 mg/dL (ref 10–300)
Microalb, Ur Waived: 30 mg/L — ABNORMAL HIGH (ref 0–19)

## 2022-08-15 LAB — BAYER DCA HB A1C WAIVED: HB A1C (BAYER DCA - WAIVED): 9 % — ABNORMAL HIGH (ref 4.8–5.6)

## 2022-08-15 MED ORDER — TRESIBA FLEXTOUCH 100 UNIT/ML ~~LOC~~ SOPN
PEN_INJECTOR | SUBCUTANEOUS | 0 refills | Status: DC
Start: 2022-08-15 — End: 2023-05-02

## 2022-08-15 NOTE — Assessment & Plan Note (Signed)
Rechecking labs today. Await results.  

## 2022-08-15 NOTE — Assessment & Plan Note (Signed)
Does not know what medicines she's been taking. Has been out of at least 1. Unclear what doses she's been taking. A1c not under good control at 9.0. Will get her back on Thursday with ALL of her medicines to do full medicine reconciliation. She does need Diabetic shoes. Ordered today.

## 2022-08-15 NOTE — Progress Notes (Signed)
BP 121/72   Pulse 87   Temp 98.1 F (36.7 C)   Wt 194 lb 6.4 oz (88.2 kg)   SpO2 98%   BMI 37.97 kg/m    Subjective:    [name], female    DOB: 1946-03-25, 76 y.o.   MRN: [id number]  HPI: Tami Cummings is a 76 y.o. female  Chief Complaint  Patient presents with   Diabetes   DIABETES- does not know what she's been taking.  Hypoglycemic episodes:no Polydipsia/polyuria: no Visual disturbance: no Chest pain: no Paresthesias: no Glucose Monitoring: no  Accucheck frequency: Not Checking Taking Insulin?: yes  Long acting insulin: thinks she has not been taking her tresiba- has been out   Short acting insulin: unsure Blood Pressure Monitoring: not checking Retinal Examination: Not up to Date Foot Exam: Up to Date Diabetic Education: Completed Pneumovax: Up to Date Influenza: Not up to Date Aspirin: no  Relevant past medical, surgical, family and social history reviewed and updated as indicated. Interim medical history since our last visit reviewed. Allergies and medications reviewed and updated.  Review of Systems  Constitutional: Negative.   Respiratory: Negative.    Cardiovascular: Negative.   Gastrointestinal: Negative.   Musculoskeletal: Negative.   Neurological: Negative.   Psychiatric/Behavioral: Negative.      Per HPI unless specifically indicated above     Objective:    BP 121/72   Pulse 87   Temp 98.1 F (36.7 C)   Wt 194 lb 6.4 oz (88.2 kg)   SpO2 98%   BMI 37.97 kg/m   Wt Readings from Last 3 Encounters:  08/15/22 194 lb 6.4 oz (88.2 kg)  05/05/22 192 lb 6.4 oz (87.3 kg)  02/03/22 196 lb (88.9 kg)    Physical Exam Vitals and nursing note reviewed.  Constitutional:      General: She is not in acute distress.    Appearance: Normal appearance. She is not ill-appearing, toxic-appearing or diaphoretic.  HENT:     Head: Normocephalic and atraumatic.     Right Ear: External ear normal.     Left Ear: External  ear normal.     Nose: Nose normal.     Mouth/Throat:     Mouth: Mucous membranes are moist.     Pharynx: Oropharynx is clear.  Eyes:     General: No scleral icterus.       Right eye: No discharge.        Left eye: No discharge.     Extraocular Movements: Extraocular movements intact.     Conjunctiva/sclera: Conjunctivae normal.     Pupils: Pupils are equal, round, and reactive to light.  Cardiovascular:     Rate and Rhythm: Normal rate and regular rhythm.     Pulses: Normal pulses.     Heart sounds: Normal heart sounds. No murmur heard.    No friction rub. No gallop.  Pulmonary:     Effort: Pulmonary effort is normal. No respiratory distress.     Breath sounds: Normal breath sounds. No stridor. No wheezing, rhonchi or rales.  Chest:     Chest wall: No tenderness.  Musculoskeletal:        General: Normal range of motion.     Cervical back: Normal range of motion and neck supple.  Skin:    General: Skin is warm and dry.     Capillary Refill: Capillary refill takes less than 2 seconds.     Coloration: Skin is not jaundiced or pale.  Findings: No bruising, erythema, lesion or rash.  Neurological:     General: No focal deficit present.     Mental Status: She is alert and oriented to person, place, and time. Mental status is at baseline.  Psychiatric:        Mood and Affect: Mood normal.        Behavior: Behavior normal.        Thought Content: Thought content normal.        Judgment: Judgment normal.     Results for orders placed or performed in visit on 05/05/22  Microscopic Examination   Urine  Result Value Ref Range   WBC, UA 0-5 0 - 5 /hpf   RBC, Urine None seen 0 - 2 /hpf   Epithelial Cells (non renal) 0-10 0 - 10 /hpf   Bacteria, UA None seen None seen/Few   Yeast, UA Present (A) None seen  CBC with Differential/Platelet  Result Value Ref Range   WBC 8.1 3.4 - 10.8 x10E3/uL   RBC 5.53 (H) 3.77 - 5.28 x10E6/uL   Hemoglobin 14.3 11.1 - 15.9 g/dL   Hematocrit  44.0 34.0 - 46.6 %   MCV 80 79 - 97 fL   MCH 25.9 (L) 26.6 - 33.0 pg   MCHC 32.5 31.5 - 35.7 g/dL   RDW 13.8 11.7 - 15.4 %   Platelets 228 150 - 450 x10E3/uL   Neutrophils 68 Not Estab. %   Lymphs 21 Not Estab. %   Monocytes 9 Not Estab. %   Eos 1 Not Estab. %   Basos 1 Not Estab. %   Neutrophils Absolute 5.5 1.4 - 7.0 x10E3/uL   Lymphocytes Absolute 1.7 0.7 - 3.1 x10E3/uL   Monocytes Absolute 0.7 0.1 - 0.9 x10E3/uL   EOS (ABSOLUTE) 0.1 0.0 - 0.4 x10E3/uL   Basophils Absolute 0.1 0.0 - 0.2 x10E3/uL   Immature Granulocytes 0 Not Estab. %   Immature Grans (Abs) 0.0 0.0 - 0.1 x10E3/uL  Comprehensive metabolic panel  Result Value Ref Range   Glucose 99 70 - 99 mg/dL   BUN 11 8 - 27 mg/dL   Creatinine, Ser 0.93 0.57 - 1.00 mg/dL   eGFR 64 >59 mL/min/1.73   BUN/Creatinine Ratio 12 12 - 28   Sodium 140 134 - 144 mmol/L   Potassium 4.1 3.5 - 5.2 mmol/L   Chloride 101 96 - 106 mmol/L   CO2 23 20 - 29 mmol/L   Calcium 9.6 8.7 - 10.3 mg/dL   Total Protein 6.7 6.0 - 8.5 g/dL   Albumin 4.3 3.7 - 4.7 g/dL   Globulin, Total 2.4 1.5 - 4.5 g/dL   Albumin/Globulin Ratio 1.8 1.2 - 2.2   Bilirubin Total 0.3 0.0 - 1.2 mg/dL   Alkaline Phosphatase 88 44 - 121 IU/L   AST 18 0 - 40 IU/L   ALT 19 0 - 32 IU/L  Lipid panel  Result Value Ref Range   Cholesterol, Total 170 100 - 199 mg/dL   Triglycerides 139 0 - 149 mg/dL   HDL 40 >39 mg/dL   VLDL Cholesterol Cal 25 5 - 40 mg/dL   LDL Chol Calc (NIH) 105 (H) 0 - 99 mg/dL   Chol/HDL Ratio 4.3 0.0 - 4.4 ratio  TSH  Result Value Ref Range   TSH 2.260 0.450 - 4.500 uIU/mL  Urinalysis, Routine w reflex microscopic  Result Value Ref Range   Specific Gravity, UA <1.005 (L) 1.005 - 1.030   pH, UA 5.5 5.0 - 7.5  Color, UA Yellow Yellow   Appearance Ur Cloudy (A) Clear   Leukocytes,UA Negative Negative   Protein,UA Negative Negative/Trace   Glucose, UA 3+ (A) Negative   Ketones, UA Negative Negative   RBC, UA Trace (A) Negative   Bilirubin, UA  Negative Negative   Urobilinogen, Ur 0.2 0.2 - 1.0 mg/dL   Nitrite, UA Negative Negative   Microscopic Examination See below:   HgB A1c  Result Value Ref Range   Hgb A1c MFr Bld 9.3 (H) 4.8 - 5.6 %   Est. average glucose Bld gHb Est-mCnc 220 mg/dL  B12  Result Value Ref Range   Vitamin B-12 291 232 - 1,245 pg/mL  Vitamin D (25 hydroxy)  Result Value Ref Range   Vit D, 25-Hydroxy 24.7 (L) 30.0 - 100.0 ng/mL      Assessment & Plan:   Problem List Items Addressed This Visit       Endocrine   DM type 2 with diabetic peripheral neuropathy ([place])    Does not know what medicines she's been taking. Has been out of at least 1. Unclear what doses she's been taking. A1c not under good control at 9.0. Will get her back on Thursday with ALL of her medicines to do full medicine reconciliation. She does need Diabetic shoes. Ordered today.      Relevant Medications   insulin degludec (TRESIBA FLEXTOUCH) 100 UNIT/ML FlexTouch Pen   Other Relevant Orders   Bayer DCA Hb A1c Waived   Comprehensive metabolic panel   Microalbumin, Urine Waived     Other   Vitamin D deficiency - Primary    Rechecking labs today. Await results.       Relevant Orders   VITAMIN D 25 Hydroxy (Vit-D Deficiency, Fractures)     Follow up plan: Return As scheduled Thursday.

## 2022-08-15 NOTE — Telephone Encounter (Signed)
Pt scheduled today to see Dr. Wynetta Emery

## 2022-08-16 LAB — COMPREHENSIVE METABOLIC PANEL
ALT: 12 IU/L (ref 0–32)
AST: 10 IU/L (ref 0–40)
Albumin/Globulin Ratio: 1.7 (ref 1.2–2.2)
Albumin: 4.3 g/dL (ref 3.8–4.8)
Alkaline Phosphatase: 89 IU/L (ref 44–121)
BUN/Creatinine Ratio: 14 (ref 12–28)
BUN: 12 mg/dL (ref 8–27)
Bilirubin Total: 0.3 mg/dL (ref 0.0–1.2)
CO2: 22 mmol/L (ref 20–29)
Calcium: 9.6 mg/dL (ref 8.7–10.3)
Chloride: 101 mmol/L (ref 96–106)
Creatinine, Ser: 0.84 mg/dL (ref 0.57–1.00)
Globulin, Total: 2.5 g/dL (ref 1.5–4.5)
Glucose: 105 mg/dL — ABNORMAL HIGH (ref 70–99)
Potassium: 3.9 mmol/L (ref 3.5–5.2)
Sodium: 142 mmol/L (ref 134–144)
Total Protein: 6.8 g/dL (ref 6.0–8.5)
eGFR: 72 mL/min/{1.73_m2} (ref >59)

## 2022-08-16 LAB — VITAMIN D 25 HYDROXY (VIT D DEFICIENCY, FRACTURES): Vit D, 25-Hydroxy: 40.8 ng/mL (ref 30.0–100.0)

## 2022-08-17 ENCOUNTER — Encounter: Payer: Self-pay | Admitting: Family Medicine

## 2022-08-17 ENCOUNTER — Ambulatory Visit (INDEPENDENT_AMBULATORY_CARE_PROVIDER_SITE_OTHER): Payer: 59 | Admitting: Family Medicine

## 2022-08-17 VITALS — BP 123/80 | HR 109 | Temp 97.9°F | Wt 192.9 lb

## 2022-08-17 DIAGNOSIS — E1142 Type 2 diabetes mellitus with diabetic polyneuropathy: Secondary | ICD-10-CM

## 2022-08-17 MED ORDER — TRULICITY 4.5 MG/0.5ML ~~LOC~~ SOAJ: 4.5000 mg | SUBCUTANEOUS | 1 refills | Status: DC

## 2022-08-17 MED ORDER — EMPAGLIFLOZIN 25 MG PO TABS: 25.0000 mg | ORAL_TABLET | Freq: Every day | ORAL | 1 refills | Status: DC

## 2022-08-17 NOTE — Telephone Encounter (Signed)
Faxed via Electronic fax, per Tami Cummings has received form.

## 2022-08-17 NOTE — Telephone Encounter (Addendum)
Corene Cornea with [employer] called to follow up on forms needed for pt for diabetic shoes. Corene Cornea stated he spoke with Salena Saner today at 11:45 AM mentioned office was trying to fax the forms to him but the forms weren't going through and hasn't received anything since.Stated unsure why he isn't receiving anything his fax is working. Provided electronic fax number as well see below.   Fax [phone] Attention Corene Cornea or [phone] Attention Corene Cornea  Electronic fax - [phone]  Call back - [phone] Mentioned this is an urgent matter.  Please contact further when possible.

## 2022-08-17 NOTE — Progress Notes (Signed)
BP 123/80   Pulse (!) 109   Temp 97.9 F (36.6 C)   Wt 192 lb 14.4 oz (87.5 kg)   SpO2 98%   BMI 37.67 kg/m    Subjective:    [name], female    DOB: 02-20-1946, 76 y.o.   MRN: [id number]  HPI: Tami Cummings is a 76 y.o. female  No chief complaint on file.  DIABETES Hypoglycemic episodes:no Polydipsia/polyuria: no Visual disturbance: no Chest pain: no Paresthesias: yes Glucose Monitoring: no  Accucheck frequency: Not Checking Taking Insulin?: yes Blood Pressure Monitoring: a few times a month Retinal Examination: Up to Date Foot Exam: Up to Date Diabetic Education: Completed Pneumovax: Up to Date Influenza: Not up to Date Aspirin: yes  Relevant past medical, surgical, family and social history reviewed and updated as indicated. Interim medical history since our last visit reviewed. Allergies and medications reviewed and updated.  Review of Systems  Constitutional: Negative.   Respiratory: Negative.    Cardiovascular: Negative.   Gastrointestinal: Negative.   Musculoskeletal: Negative.   Neurological: Negative.   Psychiatric/Behavioral: Negative.      Per HPI unless specifically indicated above     Objective:    BP 123/80   Pulse (!) 109   Temp 97.9 F (36.6 C)   Wt 192 lb 14.4 oz (87.5 kg)   SpO2 98%   BMI 37.67 kg/m   Wt Readings from Last 3 Encounters:  08/17/22 192 lb 14.4 oz (87.5 kg)  08/15/22 194 lb 6.4 oz (88.2 kg)  05/05/22 192 lb 6.4 oz (87.3 kg)    Physical Exam Vitals and nursing note reviewed.  Constitutional:      General: She is not in acute distress.    Appearance: Normal appearance. She is obese. She is not ill-appearing, toxic-appearing or diaphoretic.  HENT:     Head: Normocephalic and atraumatic.     Right Ear: External ear normal.     Left Ear: External ear normal.     Nose: Nose normal.     Mouth/Throat:     Mouth: Mucous membranes are moist.     Pharynx: Oropharynx is clear.  Eyes:      General: No scleral icterus.       Right eye: No discharge.        Left eye: No discharge.     Extraocular Movements: Extraocular movements intact.     Conjunctiva/sclera: Conjunctivae normal.     Pupils: Pupils are equal, round, and reactive to light.  Cardiovascular:     Rate and Rhythm: Normal rate and regular rhythm.     Pulses: Normal pulses.     Heart sounds: Normal heart sounds. No murmur heard.    No friction rub. No gallop.  Pulmonary:     Effort: Pulmonary effort is normal. No respiratory distress.     Breath sounds: Normal breath sounds. No stridor. No wheezing, rhonchi or rales.  Chest:     Chest wall: No tenderness.  Musculoskeletal:        General: Normal range of motion.     Cervical back: Normal range of motion and neck supple.  Skin:    General: Skin is warm and dry.     Capillary Refill: Capillary refill takes less than 2 seconds.     Coloration: Skin is not jaundiced or pale.     Findings: No bruising, erythema, lesion or rash.  Neurological:     General: No focal deficit present.  Mental Status: She is alert and oriented to person, place, and time. Mental status is at baseline.  Psychiatric:        Mood and Affect: Mood normal.        Behavior: Behavior normal.        Thought Content: Thought content normal.        Judgment: Judgment normal.     Results for orders placed or performed in visit on 08/15/22  Bayer DCA Hb A1c Waived  Result Value Ref Range   HB A1C (BAYER DCA - WAIVED) 9.0 (H) 4.8 - 5.6 %  Comprehensive metabolic panel  Result Value Ref Range   Glucose 105 (H) 70 - 99 mg/dL   BUN 12 8 - 27 mg/dL   Creatinine, Ser 0.84 0.57 - 1.00 mg/dL   eGFR 72 >59 mL/min/1.73   BUN/Creatinine Ratio 14 12 - 28   Sodium 142 134 - 144 mmol/L   Potassium 3.9 3.5 - 5.2 mmol/L   Chloride 101 96 - 106 mmol/L   CO2 22 20 - 29 mmol/L   Calcium 9.6 8.7 - 10.3 mg/dL   Total Protein 6.8 6.0 - 8.5 g/dL   Albumin 4.3 3.8 - 4.8 g/dL   Globulin, Total  2.5 1.5 - 4.5 g/dL   Albumin/Globulin Ratio 1.7 1.2 - 2.2   Bilirubin Total 0.3 0.0 - 1.2 mg/dL   Alkaline Phosphatase 89 44 - 121 IU/L   AST 10 0 - 40 IU/L   ALT 12 0 - 32 IU/L  Microalbumin, Urine Waived  Result Value Ref Range   Microalb, Ur Waived 30 (H) 0 - 19 mg/L   Creatinine, Urine Waived 50 10 - 300 mg/dL   Microalb/Creat Ratio 30-300 (H) <30 mg/g  VITAMIN D 25 Hydroxy (Vit-D Deficiency, Fractures)  Result Value Ref Range   Vit D, 25-Hydroxy 40.8 30.0 - 100.0 ng/mL      Assessment & Plan:   Problem List Items Addressed This Visit       Endocrine   DM type 2 with diabetic peripheral neuropathy ([place]) - Primary    Has only been taking 67m of trulicity. Will increase to 4.5 and continue her other medicines. Recheck 3 months. Call with any concerns.       Relevant Medications   Dulaglutide (TRULICITY) 4.5 MOT/7.7NHSOPN   empagliflozin (JARDIANCE) 25 MG TABS tablet     Follow up plan: Return in about 3 months (around 11/16/2022).

## 2022-08-18 ENCOUNTER — Encounter: Payer: Self-pay | Admitting: Family Medicine

## 2022-08-18 NOTE — Assessment & Plan Note (Signed)
Has only been taking '3mg'$  of trulicity. Will increase to 4.5 and continue her other medicines. Recheck 3 months. Call with any concerns.

## 2022-08-23 ENCOUNTER — Telehealth: Payer: Self-pay

## 2022-08-23 NOTE — Progress Notes (Signed)
Chronic Care Management Pharmacy Assistant   Name: Tami Cummings  MRN: [id number] DOB: 09/23/1946   Reason for Encounter: Disease State   Conditions to be addressed/monitored: DMII   Recent office visits:  08/17/22 Valerie Roys, DO-Family Medicine (Diabtes thype 2 with peripheral neuropathy) Orders: none, Medication changes: increase dulaglutide to 4.5 mg  08/15/22 Valerie Roys, DO-Family Medicine (Vitamin D deficiency) Orders: Labs, Medication changes: none  Recent consult visits:  None since the last coordination call  Hospital visits:  None in previous 6 months  Medications: Outpatient Encounter Medications as of 08/23/2022  Medication Sig   amLODipine (NORVASC) 10 MG tablet Take 1 tablet (10 mg total) by mouth daily.   atenolol (TENORMIN) 100 MG tablet Take 1 tablet (100 mg total) by mouth daily.   BD PEN NEEDLE MICRO U/F 32G X 6 MM MISC USE 4 TO 5 TIMES DAILY WITH INSULIN USE   Blood Glucose Monitoring Suppl (ONETOUCH VERIO) w/Device KIT Use to check blood sugar 2-3 times daily and document, bring results to provider visits.   Dulaglutide (TRULICITY) 4.5 YJ/0.9KH SOPN Inject 4.5 mg as directed once a week.   empagliflozin (JARDIANCE) 25 MG TABS tablet Take 1 tablet (25 mg total) by mouth daily before breakfast.   glucose blood test strip Use to check blood sugar 2-3 times daily and document, bring results to provider visits.   hydrALAZINE (APRESOLINE) 10 MG tablet TAKE 1 TABLET BY MOUTH TWICE  DAILY   insulin degludec (TRESIBA FLEXTOUCH) 100 UNIT/ML FlexTouch Pen INJECT 40 UNITS SUBCUTANEOUSLY ONCE DAILY   insulin lispro (HUMALOG) 100 UNIT/ML KwikPen INJECT 25 UNITS SUBCUTANEOUSLY TWICE DAILY BEFORE A MEAL   Lancets (ONETOUCH ULTRASOFT) lancets Use to check blood sugar 2-3 times daily and document, bring results to provider visits.   lovastatin (MEVACOR) 40 MG tablet TAKE 1 TABLET BY MOUTH DAILY  WITH BREAKFAST   OneTouch Delica Lancets 57M MISC 1 applicator  by Does not apply route in the morning and at bedtime.   spironolactone (ALDACTONE) 25 MG tablet TAKE 1 TABLET BY MOUTH DAILY   No facility-administered encounter medications on file as of 08/23/2022.   Recent Relevant Labs: Lab Results  Component Value Date/Time   HGBA1C 9.0 (H) 08/15/2022 04:25 PM   HGBA1C 9.3 (H) 05/05/2022 02:31 PM   HGBA1C 8.9 (H) 02/03/2022 02:07 PM   HGBA1C 9.4 (H) 01/05/2021 11:12 AM   HGBA1C 7.9 07/13/2016 12:00 AM   MICROALBUR 30 (H) 08/15/2022 04:25 PM   MICROALBUR 30 (H) 01/05/2021 11:12 AM    Kidney Function Lab Results  Component Value Date/Time   CREATININE 0.84 08/15/2022 04:27 PM   CREATININE 0.93 05/05/2022 02:31 PM   GFRNONAA 63 01/05/2021 11:14 AM   GFRAA 72 01/05/2021 11:14 AM   3 attempts were made to reach patient for diabetic assessment call. Left message for patient to return call.  Current antihyperglycemic regimen:  Jardiance 25 mg 1 tab daily Trulicity 4.5 BB/4.0ZJ inject 4.5 mg weekly Tresiba inject 40 units once daily Humalog 100 units inject 25 units twice daily  What recent interventions/DTPs have been made to improve glycemic control:  Has only been taking 63m of trulicity. Will increase to 4.5   Have there been any recent hospitalizations or ED visits since last visit with CPP? No    Adherence Review: Is the patient currently on a STATIN medication? Yes, Lovastatin 40 mg Is the patient currently on ACE/ARB medication? No Does the patient have >5 day gap between last estimated  fill dates? No   Care Gaps: Colonoscopy-01/06/21 Diabetic Foot Exam-05/05/22 Mammogram-NA Ophthalmology-04/26/21 Dexa Scan - NA Annual Well Visit - 08/08/22 Micro albumin-08/15/22 Hemoglobin A1c- 08/15/22  Star Rating Drugs: Jardiance 25 mg-last fill 08/17/22 90 ds, 04/04/22 90 ds Lovastatin 40 mg-last fill 06/20/22 100 ds, 03/28/22 100 ds  [place] Pharmacist Assistant [phone]

## 2022-09-11 ENCOUNTER — Telehealth: Payer: Self-pay

## 2022-09-11 NOTE — Chronic Care Management (AMB) (Cosign Needed Addendum)
Chronic Care Management Pharmacy Assistant   Name: Tami Cummings  MRN: [id number] DOB: 1946/09/07   Spoke with patient this morning about diabetic reading. Patient states that she has the [employer] but she does not know how to work it. States that she took her meter to her last appointment but no one showed her how to work it. She states that she has not had any symptoms of feeling faint or about to pass out.. I informed the patient that we will get someone to help her with this issue. Patient did take a reading this morning after she ate some cheese crackers which was 142.  Patient also states that her blood pressure cuff is not working properly. She did take some time this morning to get a reading which was 146/82. She looked back on her monitor for other readings but not sure of the day and time. Some of the reading she has were: 125/60, 145/80 and 123/69.  [place] [phone]

## 2022-09-19 ENCOUNTER — Telehealth: Payer: Self-pay

## 2022-09-19 NOTE — Chronic Care Management (AMB) (Signed)
    Chronic Care Management Pharmacy Assistant   Name: Tami Cummings  MRN: [id number] DOB: 02-10-1946   Spoke with Dion Saucier this morning to get blood sugar and blood pressure readings. Patient states that she has another meter that needs batteries that she will pick up today at [facility]. Patient stated that she will call me later today with blood sugar readings.   Also patient checked her blood pressure this morning while we were on the phone. Her reading this morning was 169/81. Patient states that she will also call later today with another reading.  [place] [phone]

## 2022-09-25 ENCOUNTER — Telehealth: Payer: Medicare Other

## 2022-09-25 ENCOUNTER — Telehealth: Payer: Self-pay

## 2022-09-25 NOTE — Telephone Encounter (Signed)
  Care Management   Follow Up Note   09/25/2022 Name: [name] MRN: [id number] DOB: [birth year]   Referred by: Jon Billings, NP Reason for referral : Chronic Care Management   An unsuccessful telephone outreach was attempted today. The patient was referred to the case management team for assistance with care management and care coordination.   Follow Up Plan: The patient has been provided with contact information for the care management team and has been advised to call with any health related questions or concerns.   [place] Constable, Pharm.D. - [phone]

## 2022-10-18 ENCOUNTER — Telehealth: Payer: Self-pay

## 2022-10-18 NOTE — Progress Notes (Signed)
Chronic Care Management Pharmacy Assistant   Name: Tami Cummings  MRN: [id number] DOB: 02/02/1946   Reason for Encounter: Disease State   Conditions to be addressed/monitored: DMII   Recent office visits:  None since last coordination call  Recent consult visits:  None since last coordination call  Hospital visits:  None in previous 6 months  Medications: Outpatient Encounter Medications as of 10/18/2022  Medication Sig   amLODipine (NORVASC) 10 MG tablet Take 1 tablet (10 mg total) by mouth daily.   atenolol (TENORMIN) 100 MG tablet Take 1 tablet (100 mg total) by mouth daily.   BD PEN NEEDLE MICRO U/F 32G X 6 MM MISC USE 4 TO 5 TIMES DAILY WITH INSULIN USE   Blood Glucose Monitoring Suppl (ONETOUCH VERIO) w/Device KIT Use to check blood sugar 2-3 times daily and document, bring results to provider visits.   Dulaglutide (TRULICITY) 4.5 JK/0.9FG SOPN Inject 4.5 mg as directed once a week.   empagliflozin (JARDIANCE) 25 MG TABS tablet Take 1 tablet (25 mg total) by mouth daily before breakfast.   glucose blood test strip Use to check blood sugar 2-3 times daily and document, bring results to provider visits.   hydrALAZINE (APRESOLINE) 10 MG tablet TAKE 1 TABLET BY MOUTH TWICE  DAILY   insulin degludec (TRESIBA FLEXTOUCH) 100 UNIT/ML FlexTouch Pen INJECT 40 UNITS SUBCUTANEOUSLY ONCE DAILY   insulin lispro (HUMALOG) 100 UNIT/ML KwikPen INJECT 25 UNITS SUBCUTANEOUSLY TWICE DAILY BEFORE A MEAL   Lancets (ONETOUCH ULTRASOFT) lancets Use to check blood sugar 2-3 times daily and document, bring results to provider visits.   lovastatin (MEVACOR) 40 MG tablet TAKE 1 TABLET BY MOUTH DAILY  WITH BREAKFAST   OneTouch Delica Lancets 18E MISC 1 applicator by Does not apply route in the morning and at bedtime.   spironolactone (ALDACTONE) 25 MG tablet TAKE 1 TABLET BY MOUTH DAILY   No facility-administered encounter medications on file as of 10/18/2022.   Recent Relevant Labs: Lab  Results  Component Value Date/Time   HGBA1C 9.0 (H) 08/15/2022 04:25 PM   HGBA1C 9.3 (H) 05/05/2022 02:31 PM   HGBA1C 8.9 (H) 02/03/2022 02:07 PM   HGBA1C 9.4 (H) 01/05/2021 11:12 AM   HGBA1C 7.9 07/13/2016 12:00 AM   MICROALBUR 30 (H) 08/15/2022 04:25 PM   MICROALBUR 30 (H) 01/05/2021 11:12 AM    Kidney Function Lab Results  Component Value Date/Time   CREATININE 0.84 08/15/2022 04:27 PM   CREATININE 0.93 05/05/2022 02:31 PM   GFRNONAA 63 01/05/2021 11:14 AM   GFRAA 72 01/05/2021 11:14 AM    Current antihyperglycemic regimen:  Jardiance 25 mg 1 tab daily Trulicity 4.5 XH/3.7JI inject 4.5 mg weekly Tresiba inject 40 units once daily Humalog 100 units inject 25 units twice daily  What recent interventions/DTPs have been made to improve glycemic control:  None noted  Have there been any recent hospitalizations or ED visits since last visit with CPP? No  Patient denies hypoglycemic symptoms, including None  Patient denies hyperglycemic symptoms, including none  How often are you checking your blood sugar? Patient states that she has not checked her blood sugar because the meter she has is not working. She states that she even replaced the batteries and it still does not work.  What are your blood sugars ranging? NA  During the week, how often does your blood glucose drop below 70?  NA  Are you checking your feet daily/regularly? Patient states that she does not have any swelling, redness  or open sores on feet  Adherence Review: Is the patient currently on a STATIN medication? Yes Is the patient currently on ACE/ARB medication? No Does the patient have >5 day gap between last estimated fill dates? No    Care Gaps: Colonoscopy-01/06/21 Diabetic Foot Exam-05/05/22 Mammogram-NA Ophthalmology-04/26/21 Dexa Scan - NA Annual Well Visit - 08/08/22 Micro albumin-08/15/22 Hemoglobin A1c- 08/15/22  Star Rating Drugs: Jardiance 25 mg-last fill 08/17/22 90 ds, 04/04/22 90  ds Lovastatin 40 mg-last fill 09/23/22 100 ds, 06/20/22 100 ds  [place] [phone]

## 2022-11-15 NOTE — Progress Notes (Deleted)
There were no vitals taken for this visit.   Subjective:    [name], female    DOB: [birth year], 76 y.o.   MRN: [id number]  HPI: Tami Cummings is a 76 y.o. female  No chief complaint on file.  HYPERTENSION / HYPERLIPIDEMIA Satisfied with current treatment? no Duration of hypertension: years BP monitoring frequency: not checking BP range:  BP medication side effects: no Past BP meds: amlodipine and atenolol, hydralazine, and sprinolactone Duration of hyperlipidemia: years Cholesterol medication side effects: no Cholesterol supplements: none Past cholesterol medications: lovastatin (mevacor) Medication compliance: excellent compliance Aspirin: no Recent stressors: no Recurrent headaches: no Visual changes: no Palpitations: no Dyspnea: no Chest pain: no Lower extremity edema: no Dizzy/lightheaded: no  DIABETES Hypoglycemic episodes:no Polydipsia/polyuria: no Visual disturbance: no Chest pain: no Paresthesias: no Glucose Monitoring: yes  Accucheck frequency: daily  Fasting glucose: 130  Post prandial:  Evening:  Before meals: Taking Insulin?: no  Long acting insulin:  Short acting insulin: Blood Pressure Monitoring: not checking Retinal Examination: Not up to Date Foot Exam: Up to Date Diabetic Education: Not Completed Pneumovax: Up to Date Influenza: Up to Date Aspirin: no  MEMORY ISSUES Patient states she is starting to forget some things.  She doesn't drive.  But her states her daughter says she is forgetful.  She is unable to recall what her sugars run at home.  States she will start to write them down.      08/08/2022   10:05 AM 06/10/2021    9:54 AM 05/26/2019    8:58 AM 05/23/2018    9:58 AM  6CIT Screen  What Year? 4 points 0 points 0 points 0 points  What month? 0 points 0 points 0 points 0 points  What time? 0 points 0 points 0 points 0 points  Count back from 20 0 points 0 points 0 points 0 points  Months in  reverse 4 points 4 points 0 points 0 points  Repeat phrase 4 points 2 points 2 points 2 points  Total Score 12 points 6 points 2 points 2 points       Relevant past medical, surgical, family and social history reviewed and updated as indicated. Interim medical history since our last visit reviewed. Allergies and medications reviewed and updated.  Review of Systems  Eyes:  Negative for visual disturbance.  Respiratory:  Negative for cough, chest tightness and shortness of breath.   Cardiovascular:  Negative for chest pain, palpitations and leg swelling.  Endocrine: Negative for polydipsia and polyuria.  Neurological:  Negative for dizziness, numbness and headaches.   Per HPI unless specifically indicated above     Objective:    There were no vitals taken for this visit.  Wt Readings from Last 3 Encounters:  08/17/22 192 lb 14.4 oz (87.5 kg)  08/15/22 194 lb 6.4 oz (88.2 kg)  05/05/22 192 lb 6.4 oz (87.3 kg)    Physical Exam Vitals and nursing note reviewed.  Constitutional:      General: She is not in acute distress.    Appearance: Normal appearance. She is obese. She is not ill-appearing, toxic-appearing or diaphoretic.  HENT:     Head: Normocephalic.     Right Ear: External ear normal.     Left Ear: External ear normal.     Nose: Nose normal.     Mouth/Throat:     Mouth: Mucous membranes are moist.     Pharynx: Oropharynx is clear.  Eyes:  General:        Right eye: No discharge.        Left eye: No discharge.     Extraocular Movements: Extraocular movements intact.     Conjunctiva/sclera: Conjunctivae normal.     Pupils: Pupils are equal, round, and reactive to light.  Cardiovascular:     Rate and Rhythm: Normal rate and regular rhythm.     Heart sounds: No murmur heard. Pulmonary:     Effort: Pulmonary effort is normal. No respiratory distress.     Breath sounds: Normal breath sounds. No wheezing or rales.  Musculoskeletal:     Cervical back: Normal range  of motion and neck supple.  Skin:    General: Skin is warm and dry.     Capillary Refill: Capillary refill takes less than 2 seconds.  Neurological:     General: No focal deficit present.     Mental Status: She is alert and oriented to person, place, and time. Mental status is at baseline.  Psychiatric:        Mood and Affect: Mood normal.        Behavior: Behavior normal.        Thought Content: Thought content normal.        Judgment: Judgment normal.    Results for orders placed or performed in visit on 08/15/22  Bayer DCA Hb A1c Waived  Result Value Ref Range   HB A1C (BAYER DCA - WAIVED) 9.0 (H) 4.8 - 5.6 %  Comprehensive metabolic panel  Result Value Ref Range   Glucose 105 (H) 70 - 99 mg/dL   BUN 12 8 - 27 mg/dL   Creatinine, Ser 0.84 0.57 - 1.00 mg/dL   eGFR 72 >59 mL/min/1.73   BUN/Creatinine Ratio 14 12 - 28   Sodium 142 134 - 144 mmol/L   Potassium 3.9 3.5 - 5.2 mmol/L   Chloride 101 96 - 106 mmol/L   CO2 22 20 - 29 mmol/L   Calcium 9.6 8.7 - 10.3 mg/dL   Total Protein 6.8 6.0 - 8.5 g/dL   Albumin 4.3 3.8 - 4.8 g/dL   Globulin, Total 2.5 1.5 - 4.5 g/dL   Albumin/Globulin Ratio 1.7 1.2 - 2.2   Bilirubin Total 0.3 0.0 - 1.2 mg/dL   Alkaline Phosphatase 89 44 - 121 IU/L   AST 10 0 - 40 IU/L   ALT 12 0 - 32 IU/L  Microalbumin, Urine Waived  Result Value Ref Range   Microalb, Ur Waived 30 (H) 0 - 19 mg/L   Creatinine, Urine Waived 50 10 - 300 mg/dL   Microalb/Creat Ratio 30-300 (H) <30 mg/g  VITAMIN D 25 Hydroxy (Vit-D Deficiency, Fractures)  Result Value Ref Range   Vit D, 25-Hydroxy 40.8 30.0 - 100.0 ng/mL      Assessment & Plan:   Problem List Items Addressed This Visit      Cardiovascular and Mediastinum   Hypertension associated with diabetes ([place]) - Primary   Senile purpura (HCC)     Endocrine   DM type 2 with diabetic peripheral neuropathy (HCC)   Hyperlipidemia associated with type 2 diabetes mellitus (HCC)     Genitourinary   CKD (chronic  kidney disease) stage 3, GFR 30-59 ml/min (HCC)     Other   Morbid obesity (HCC)   Vitamin D deficiency   Vitamin B12 deficiency     Follow up plan: No follow-ups on file.

## 2022-11-16 ENCOUNTER — Ambulatory Visit: Payer: Medicaid Other | Admitting: Nurse Practitioner

## 2022-11-16 DIAGNOSIS — E1142 Type 2 diabetes mellitus with diabetic polyneuropathy: Secondary | ICD-10-CM

## 2022-11-16 DIAGNOSIS — E538 Deficiency of other specified B group vitamins: Secondary | ICD-10-CM

## 2022-11-16 DIAGNOSIS — D692 Other nonthrombocytopenic purpura: Secondary | ICD-10-CM

## 2022-11-16 DIAGNOSIS — E1169 Type 2 diabetes mellitus with other specified complication: Secondary | ICD-10-CM

## 2022-11-16 DIAGNOSIS — E1159 Type 2 diabetes mellitus with other circulatory complications: Secondary | ICD-10-CM

## 2022-11-16 DIAGNOSIS — N1831 Chronic kidney disease, stage 3a: Secondary | ICD-10-CM

## 2022-11-16 DIAGNOSIS — E559 Vitamin D deficiency, unspecified: Secondary | ICD-10-CM

## 2022-12-07 ENCOUNTER — Encounter: Payer: Self-pay | Admitting: Nurse Practitioner

## 2022-12-07 ENCOUNTER — Ambulatory Visit (INDEPENDENT_AMBULATORY_CARE_PROVIDER_SITE_OTHER): Payer: Medicare Other | Admitting: Nurse Practitioner

## 2022-12-07 VITALS — BP 129/74 | HR 96 | Temp 98.0°F | Wt 185.3 lb

## 2022-12-07 DIAGNOSIS — E538 Deficiency of other specified B group vitamins: Secondary | ICD-10-CM

## 2022-12-07 DIAGNOSIS — D692 Other nonthrombocytopenic purpura: Secondary | ICD-10-CM

## 2022-12-07 DIAGNOSIS — E785 Hyperlipidemia, unspecified: Secondary | ICD-10-CM | POA: Diagnosis not present

## 2022-12-07 DIAGNOSIS — E1142 Type 2 diabetes mellitus with diabetic polyneuropathy: Secondary | ICD-10-CM

## 2022-12-07 DIAGNOSIS — E1169 Type 2 diabetes mellitus with other specified complication: Secondary | ICD-10-CM | POA: Diagnosis not present

## 2022-12-07 DIAGNOSIS — I152 Hypertension secondary to endocrine disorders: Secondary | ICD-10-CM | POA: Diagnosis not present

## 2022-12-07 DIAGNOSIS — E559 Vitamin D deficiency, unspecified: Secondary | ICD-10-CM | POA: Diagnosis not present

## 2022-12-07 DIAGNOSIS — E1159 Type 2 diabetes mellitus with other circulatory complications: Secondary | ICD-10-CM

## 2022-12-07 DIAGNOSIS — N1831 Chronic kidney disease, stage 3a: Secondary | ICD-10-CM

## 2022-12-07 NOTE — Assessment & Plan Note (Signed)
Reassurance provided today. No current concerns.

## 2022-12-07 NOTE — Assessment & Plan Note (Signed)
Labs ordered at visit today.  Will make recommendations based on lab results.   

## 2022-12-07 NOTE — Assessment & Plan Note (Signed)
Chronic.  Controlled.  Continue with current medication regimen of Hydralazine '10mg'$  BID, Spirnolactone '25mg'$ , Amlodipine '10mg'$ , and Atenolol '100mg'$ .  Labs ordered today.  Up to date on Refills. Return to clinic in 3 months for reevaluation.  Call sooner if concerns arise.

## 2022-12-07 NOTE — Assessment & Plan Note (Signed)
Chronic.  Uncontrolled.  Last A1c was 9.0.  Labs ordered today.  Currently on Trulicity 4.'5mg'$  weekly, Januvia '50mg'$ , Jardiance '25mg'$ .  Unsure of Insulin doses.  She believes she is doing 72u of her [place] and Humalog 25u daily.  May need to change from trulicity to [facility] for better A1c control.  Will make recommendations based on lab results.  Return to clinic in 3 months for reevaluation.  Call sooner if concerns arise.

## 2022-12-07 NOTE — Assessment & Plan Note (Signed)
Chronic.  Labs ordered today. Will make recommendations based on lab results. Will make medication adjustments as needed. Follow up in 3 months.  Call sooner if concerns arise.

## 2022-12-07 NOTE — Assessment & Plan Note (Signed)
Chronic.  Controlled.  Continue with current medication regimen of Lovastatin '40mg'$  daily.  Labs ordered today.  Return to clinic in 3 months for reevaluation.  Call sooner if concerns arise.

## 2022-12-07 NOTE — Assessment & Plan Note (Signed)
Recommended eating smaller high protein, low fat meals more frequently and exercising 30 mins a day 5 times a week with a goal of 10-15lb weight loss in the next 3 months. Patient voiced their understanding and motivation to adhere to these recommendations.  

## 2022-12-07 NOTE — Progress Notes (Signed)
BP 129/74 (BP Location: Left Arm, Cuff Size: Normal)   Pulse 96   Temp 98 F (36.7 C) (Oral)   Wt 185 lb 4.8 oz (84.1 kg)   SpO2 98%   BMI 36.19 kg/m    Subjective:    [name], female    DOB: Sep 30, 1946, 75 y.o.   MRN: [id number]  HPI: Tami Cummings is a 76 y.o. female  Chief Complaint  Patient presents with   Diabetes    No recent eye exam per patient.    Hyperlipidemia   Hypertension   HYPERTENSION / HYPERLIPIDEMIA Satisfied with current treatment? no Duration of hypertension: years BP monitoring frequency: not checking BP range:  BP medication side effects: no Past BP meds: amlodipine and atenolol, hydralazine, and sprinolactone Duration of hyperlipidemia: years Cholesterol medication side effects: no Cholesterol supplements: none Past cholesterol medications: lovastatin (mevacor) Medication compliance: excellent compliance Aspirin: no Recent stressors: no Recurrent headaches: no Visual changes: no Palpitations: no Dyspnea: no Chest pain: no Lower extremity edema: no Dizzy/lightheaded: no  DIABETES Patient's Last A1c was 9.0 in August.   Hypoglycemic episodes:no Polydipsia/polyuria: no Visual disturbance: no Chest pain: no Paresthesias: no Glucose Monitoring: yes  Accucheck frequency: daily  Fasting glucose: not sure what they are running  Post prandial:  Evening:  Before meals: Taking Insulin?: yes  Long acting insulin: 40u  Short acting insulin:  25u Blood Pressure Monitoring: not checking Retinal Examination: Not up to Date Foot Exam: Up to Date Diabetic Education: Not Completed Pneumovax: Up to Date Influenza: Up to Date Aspirin: no     Relevant past medical, surgical, family and social history reviewed and updated as indicated. Interim medical history since our last visit reviewed. Allergies and medications reviewed and updated.  Review of Systems  Eyes:  Negative for visual disturbance.   Respiratory:  Negative for cough, chest tightness and shortness of breath.   Cardiovascular:  Negative for chest pain, palpitations and leg swelling.  Endocrine: Negative for polydipsia and polyuria.  Neurological:  Negative for dizziness, numbness and headaches.    Per HPI unless specifically indicated above     Objective:    BP 129/74 (BP Location: Left Arm, Cuff Size: Normal)   Pulse 96   Temp 98 F (36.7 C) (Oral)   Wt 185 lb 4.8 oz (84.1 kg)   SpO2 98%   BMI 36.19 kg/m   Wt Readings from Last 3 Encounters:  12/07/22 185 lb 4.8 oz (84.1 kg)  08/17/22 192 lb 14.4 oz (87.5 kg)  08/15/22 194 lb 6.4 oz (88.2 kg)    Physical Exam Vitals and nursing note reviewed.  Constitutional:      General: She is not in acute distress.    Appearance: Normal appearance. She is obese. She is not ill-appearing, toxic-appearing or diaphoretic.  HENT:     Head: Normocephalic.     Right Ear: External ear normal.     Left Ear: External ear normal.     Nose: Nose normal.     Mouth/Throat:     Mouth: Mucous membranes are moist.     Pharynx: Oropharynx is clear.  Eyes:     General:        Right eye: No discharge.        Left eye: No discharge.     Extraocular Movements: Extraocular movements intact.     Conjunctiva/sclera: Conjunctivae normal.     Pupils: Pupils are equal, round, and reactive to light.  Cardiovascular:  Rate and Rhythm: Normal rate and regular rhythm.     Heart sounds: No murmur heard. Pulmonary:     Effort: Pulmonary effort is normal. No respiratory distress.     Breath sounds: Normal breath sounds. No wheezing or rales.  Musculoskeletal:     Cervical back: Normal range of motion and neck supple.  Skin:    General: Skin is warm and dry.     Capillary Refill: Capillary refill takes less than 2 seconds.  Neurological:     General: No focal deficit present.     Mental Status: She is alert and oriented to person, place, and time. Mental status is at baseline.   Psychiatric:        Mood and Affect: Mood normal.        Behavior: Behavior normal.        Thought Content: Thought content normal.        Judgment: Judgment normal.     Results for orders placed or performed in visit on 08/15/22  Bayer DCA Hb A1c Waived  Result Value Ref Range   HB A1C (BAYER DCA - WAIVED) 9.0 (H) 4.8 - 5.6 %  Comprehensive metabolic panel  Result Value Ref Range   Glucose 105 (H) 70 - 99 mg/dL   BUN 12 8 - 27 mg/dL   Creatinine, Ser 0.84 0.57 - 1.00 mg/dL   eGFR 72 >59 mL/min/1.73   BUN/Creatinine Ratio 14 12 - 28   Sodium 142 134 - 144 mmol/L   Potassium 3.9 3.5 - 5.2 mmol/L   Chloride 101 96 - 106 mmol/L   CO2 22 20 - 29 mmol/L   Calcium 9.6 8.7 - 10.3 mg/dL   Total Protein 6.8 6.0 - 8.5 g/dL   Albumin 4.3 3.8 - 4.8 g/dL   Globulin, Total 2.5 1.5 - 4.5 g/dL   Albumin/Globulin Ratio 1.7 1.2 - 2.2   Bilirubin Total 0.3 0.0 - 1.2 mg/dL   Alkaline Phosphatase 89 44 - 121 IU/L   AST 10 0 - 40 IU/L   ALT 12 0 - 32 IU/L  Microalbumin, Urine Waived  Result Value Ref Range   Microalb, Ur Waived 30 (H) 0 - 19 mg/L   Creatinine, Urine Waived 50 10 - 300 mg/dL   Microalb/Creat Ratio 30-300 (H) <30 mg/g  VITAMIN D 25 Hydroxy (Vit-D Deficiency, Fractures)  Result Value Ref Range   Vit D, 25-Hydroxy 40.8 30.0 - 100.0 ng/mL      Assessment & Plan:   Problem List Items Addressed This Visit       Cardiovascular and Mediastinum   Hypertension associated with diabetes ([place]) - Primary    Chronic.  Controlled.  Continue with current medication regimen of Hydralazine 979m BID, Spirnolactone 28m Amlodipine 1074mand Atenolol 100m93mLabs ordered today.  Up to date on Refills. Return to clinic in 3 months for reevaluation.  Call sooner if concerns arise.         Relevant Orders   Comp Met (CMET)   Senile purpura (HCC)[place] Reassurance provided today. No current concerns.        Endocrine   DM type 2 with diabetic peripheral neuropathy (HCC)    Chronic.   Uncontrolled.  Last A1c was 9.0.  Labs ordered today.  Currently on Trulicity 4.79m7.8EUkly, Januvia 50mg51mrdiance 279mg.88msure of Insulin doses.  She believes she is doing 40u of47ur [place]resib[place] 25u daily.  May need to change from trulicity to [employer]  for better A1c control.  Will make recommendations based on lab results.  Return to clinic in 3 months for reevaluation.  Call sooner if concerns arise.       Relevant Orders   HgB A1c   Hyperlipidemia associated with type 2 diabetes mellitus (HCC)    Chronic.  Controlled.  Continue with current medication regimen of Lovastatin 62m daily.  Labs ordered today.  Return to clinic in 3 months for reevaluation.  Call sooner if concerns arise.        Relevant Orders   Lipid Profile     Genitourinary   CKD (chronic kidney disease) stage 3, GFR 30-59 ml/min (HCC)    Chronic.  Labs ordered today. Will make recommendations based on lab results. Will make medication adjustments as needed. Follow up in 3 months.  Call sooner if concerns arise.         Other   Morbid obesity ([place]    Recommended eating smaller high protein, low fat meals more frequently and exercising 30 mins a day 5 times a week with a goal of 10-15lb weight loss in the next 3 months. Patient voiced their understanding and motivation to adhere to these recommendations.       Vitamin D deficiency    Labs ordered at visit today.  Will make recommendations based on lab results.        Relevant Orders   B12   Vitamin B12 deficiency    Labs ordered at visit today.  Will make recommendations based on lab results.        Relevant Orders   Vitamin D (25 hydroxy)     Follow up plan: Return in about 3 months (around 03/08/2023) for HTN, HLD, DM2 FU.

## 2022-12-08 ENCOUNTER — Other Ambulatory Visit: Payer: Self-pay | Admitting: Nurse Practitioner

## 2022-12-08 LAB — HEMOGLOBIN A1C
Est. average glucose Bld gHb Est-mCnc: 220 mg/dL
Hgb A1c MFr Bld: 9.3 % — ABNORMAL HIGH (ref 4.8–5.6)

## 2022-12-08 LAB — LIPID PANEL
Chol/HDL Ratio: 3.9 ratio (ref 0.0–4.4)
Cholesterol, Total: 147 mg/dL (ref 100–199)
HDL: 38 mg/dL — ABNORMAL LOW (ref >39)
LDL Chol Calc (NIH): 85 mg/dL (ref 0–99)
Triglycerides: 136 mg/dL (ref 0–149)
VLDL Cholesterol Cal: 24 mg/dL (ref 5–40)

## 2022-12-08 LAB — COMPREHENSIVE METABOLIC PANEL
ALT: 16 IU/L (ref 0–32)
AST: 10 IU/L (ref 0–40)
Albumin/Globulin Ratio: 1.5 (ref 1.2–2.2)
Albumin: 4.1 g/dL (ref 3.8–4.8)
Alkaline Phosphatase: 82 IU/L (ref 44–121)
BUN/Creatinine Ratio: 15 (ref 12–28)
BUN: 12 mg/dL (ref 8–27)
Bilirubin Total: 0.2 mg/dL (ref 0.0–1.2)
CO2: 22 mmol/L (ref 20–29)
Calcium: 9.5 mg/dL (ref 8.7–10.3)
Chloride: 104 mmol/L (ref 96–106)
Creatinine, Ser: 0.79 mg/dL (ref 0.57–1.00)
Globulin, Total: 2.7 g/dL (ref 1.5–4.5)
Glucose: 135 mg/dL — ABNORMAL HIGH (ref 70–99)
Potassium: 4.4 mmol/L (ref 3.5–5.2)
Sodium: 141 mmol/L (ref 134–144)
Total Protein: 6.8 g/dL (ref 6.0–8.5)
eGFR: 78 mL/min/{1.73_m2} (ref >59)

## 2022-12-08 LAB — VITAMIN B12: Vitamin B-12: 347 pg/mL (ref 232–1245)

## 2022-12-08 LAB — VITAMIN D 25 HYDROXY (VIT D DEFICIENCY, FRACTURES): Vit D, 25-Hydroxy: 38.5 ng/mL (ref 30.0–100.0)

## 2022-12-08 MED ORDER — OZEMPIC (0.25 OR 0.5 MG/DOSE) 2 MG/3ML ~~LOC~~ SOPN: 0.2500 mg | PEN_INJECTOR | SUBCUTANEOUS | 0 refills | Status: DC

## 2022-12-08 MED ORDER — OZEMPIC (0.25 OR 0.5 MG/DOSE) 2 MG/1.5ML ~~LOC~~ SOPN: 0.2500 mg | PEN_INJECTOR | SUBCUTANEOUS | 0 refills | Status: DC

## 2022-12-08 NOTE — Telephone Encounter (Signed)
Requested medication (s) are due for refill today: alternative dose requsted  Requested medication (s) are on the active medication list: yes  Last refill:  12/08/22  Future visit scheduled: yes  Notes to clinic:  this was sent for the old formulation please send for new form. 0.25/0.'5mg'$  dosing '2mg'$ /40m pen      Requested Prescriptions  Pending Prescriptions Disp Refills   OZEMPIC, 0.25 OR 0.5 MG/DOSE, 2 MG/1.5ML SOPN [Pharmacy Med Name: OZEMPIC 0.25&0.5 '2MG'$ /1.5ML PEN] 3 mL 0    Sig: INJECT 0.'25MG'$  INTO THE SKIN ONCE WEEKLY FOR 4 WEEKS THEN INCREASE TO 0.'5MG'$  WEEKLY     Endocrinology:  Diabetes - GLP-1 Receptor Agonists - semaglutide Failed - 12/08/2022  8:46 AM      Failed - HBA1C in normal range and within 180 days    Hemoglobin A1C  Date Value Ref Range Status  07/13/2016 7.9  Final   HB A1C (BAYER DCA - WAIVED)  Date Value Ref Range Status  08/15/2022 9.0 (H) 4.8 - 5.6 % Final    Comment:             Prediabetes: 5.7 - 6.4          Diabetes: >6.4          Glycemic control for adults with diabetes: <7.0    Hgb A1c MFr Bld  Date Value Ref Range Status  12/07/2022 9.3 (H) 4.8 - 5.6 % Final    Comment:             Prediabetes: 5.7 - 6.4          Diabetes: >6.4          Glycemic control for adults with diabetes: <7.0          Passed - Cr in normal range and within 360 days    Creatinine, Ser  Date Value Ref Range Status  12/07/2022 0.79 0.57 - 1.00 mg/dL Final         Passed - Valid encounter within last 6 months    Recent Outpatient Visits           [place] [facility] ([place]   [facility]   3 months ago DM type 2 with diabetic peripheral neuropathy ([place]   [place] Megan P, DO   3 months ago Vitamin D deficiency   [place]   7 months ago Annual physical exam   [facility]   10 months ago Hypertension  associated with diabetes ([facility]   [place] Karen, NP       Future Appointments             In 1 month [facility]  In 3 months [facility] NP [facility] [place]

## 2022-12-08 NOTE — Addendum Note (Signed)
Addended by: Jon Billings on: 12/08/2022 07:58 AM   Modules accepted: Orders

## 2022-12-08 NOTE — Progress Notes (Signed)
Please let patient know that her lab work shows that her A1c is still 9.3.  I think we should change from Trulicity to ozempic.  I have sent in this prescription for her.  This is a once weekly injection just like Trulicity.  She will stop the Trulicity.  I would also like her to increase her [place] or daily insulin to 50u daily.  I would like her to come back in 1 month and make sure she is tolerating the medication well.  No other concerns at this time.  Follow up as discussed.

## 2022-12-15 ENCOUNTER — Telehealth: Payer: Self-pay | Admitting: Nurse Practitioner

## 2022-12-15 NOTE — Telephone Encounter (Signed)
Mickel Baas with [facility] called to verify Ozempic prescription. Verbalizes understanding.

## 2023-01-09 ENCOUNTER — Ambulatory Visit: Payer: 59 | Admitting: Nurse Practitioner

## 2023-01-18 ENCOUNTER — Ambulatory Visit: Payer: 59 | Admitting: Nurse Practitioner

## 2023-01-23 ENCOUNTER — Ambulatory Visit (INDEPENDENT_AMBULATORY_CARE_PROVIDER_SITE_OTHER): Payer: 59 | Admitting: Nurse Practitioner

## 2023-01-23 ENCOUNTER — Encounter: Payer: Self-pay | Admitting: Nurse Practitioner

## 2023-01-23 VITALS — BP 165/72 | HR 83 | Temp 97.7°F | Wt 178.5 lb

## 2023-01-23 DIAGNOSIS — E1142 Type 2 diabetes mellitus with diabetic polyneuropathy: Secondary | ICD-10-CM | POA: Diagnosis not present

## 2023-01-23 DIAGNOSIS — F03A0 Unspecified dementia, mild, without behavioral disturbance, psychotic disturbance, mood disturbance, and anxiety: Secondary | ICD-10-CM | POA: Insufficient documentation

## 2023-01-23 NOTE — Assessment & Plan Note (Signed)
Chronic. MMSE 20/30 in office today.  Last year at this time was 28/30.  Discussed dementia with patient and daughter during visit.  Discussed referral to Neurology.

## 2023-01-23 NOTE — Assessment & Plan Note (Signed)
Chronic.  Discussed with patient and her daughter about switching from Trulicity to [employer].  Both agree to the switch.  Continue with Tyler Aas 40u.  Follow up in 1 month.  Call sooner if concerns arise.

## 2023-01-23 NOTE — Progress Notes (Signed)
BP (!) 165/72   Pulse 83   Temp 97.7 F (36.5 C) (Oral)   Wt 178 lb 8 oz (81 kg)   SpO2 97%   BMI 34.86 kg/m    Subjective:    [name], female    DOB: 1946-08-08, 77 y.o.   MRN: [id number]  HPI: Tami Cummings is a 77 y.o. female  Chief Complaint  Patient presents with   Diabetes   Hypertension    DIABETES Patient's Last A1c was 9.3 in August.  Has not started her Ozempic.  Has still been taking trulicity. Hypoglycemic episodes:no Polydipsia/polyuria: no Visual disturbance: no Chest pain: no Paresthesias: no Glucose Monitoring: yes  Accucheck frequency: daily  Fasting glucose: not sure what they are running  Post prandial:  Evening:  Before meals: Taking Insulin?: yes  Long acting insulin: 40u  Short acting insulin:  25u Checking blood pressures: not checking Retinal Examination: Not up to Date Foot Exam: Up to Date Diabetic Education: Not Completed Pneumovax: Up to Date Influenza: Up to Date Aspirin: no   Patient states she feels like her memory has declined some.  Patient's daughter is present with her at visit today.  She is concerned about patient's memory.  She is forgetting everyday things that she wasn't previously forgetting.       01/23/2023    3:09 PM 02/06/2022    8:54 AM  MMSE - Mini Mental State Exam  Orientation to time 1 4  Orientation to Place 4 5  Registration 3 3  Attention/ Calculation 3 5  Recall 1 3  Language- name 2 objects 2 2  Language- repeat 1 1  Language- follow 3 step command 3 3  Language- read & follow direction 1 1  Write a sentence 1 1  Copy design 0 0  Total score 20 28      Relevant past medical, surgical, family and social history reviewed and updated as indicated. Interim medical history since our last visit reviewed. Allergies and medications reviewed and updated.  Review of Systems  Eyes:  Negative for visual disturbance.  Respiratory:  Negative for cough, chest tightness  and shortness of breath.   Cardiovascular:  Negative for chest pain, palpitations and leg swelling.  Endocrine: Negative for polydipsia and polyuria.  Neurological:  Negative for dizziness, numbness and headaches.       Memory concerns    Per HPI unless specifically indicated above     Objective:    BP (!) 165/72   Pulse 83   Temp 97.7 F (36.5 C) (Oral)   Wt 178 lb 8 oz (81 kg)   SpO2 97%   BMI 34.86 kg/m   Wt Readings from Last 3 Encounters:  01/23/23 178 lb 8 oz (81 kg)  12/07/22 185 lb 4.8 oz (84.1 kg)  08/17/22 192 lb 14.4 oz (87.5 kg)    Physical Exam Vitals and nursing note reviewed.  Constitutional:      General: She is not in acute distress.    Appearance: Normal appearance. She is obese. She is not ill-appearing, toxic-appearing or diaphoretic.  HENT:     Head: Normocephalic.     Right Ear: External ear normal.     Left Ear: External ear normal.     Nose: Nose normal.     Mouth/Throat:     Mouth: Mucous membranes are moist.     Pharynx: Oropharynx is clear.  Eyes:     General:  Right eye: No discharge.        Left eye: No discharge.     Extraocular Movements: Extraocular movements intact.     Conjunctiva/sclera: Conjunctivae normal.     Pupils: Pupils are equal, round, and reactive to light.  Cardiovascular:     Rate and Rhythm: Normal rate and regular rhythm.     Heart sounds: No murmur heard. Pulmonary:     Effort: Pulmonary effort is normal. No respiratory distress.     Breath sounds: Normal breath sounds. No wheezing or rales.  Musculoskeletal:     Cervical back: Normal range of motion and neck supple.  Skin:    General: Skin is warm and dry.     Capillary Refill: Capillary refill takes less than 2 seconds.  Neurological:     General: No focal deficit present.     Mental Status: She is alert and oriented to person, place, and time. Mental status is at baseline.  Psychiatric:        Mood and Affect: Mood normal.        Behavior: Behavior  normal.        Thought Content: Thought content normal.        Judgment: Judgment normal.     Results for orders placed or performed in visit on 12/07/22  Comp Met (CMET)  Result Value Ref Range   Glucose 135 (H) 70 - 99 mg/dL   BUN 12 8 - 27 mg/dL   Creatinine, Ser 0.79 0.57 - 1.00 mg/dL   eGFR 78 >59 mL/min/1.73   BUN/Creatinine Ratio 15 12 - 28   Sodium 141 134 - 144 mmol/L   Potassium 4.4 3.5 - 5.2 mmol/L   Chloride 104 96 - 106 mmol/L   CO2 22 20 - 29 mmol/L   Calcium 9.5 8.7 - 10.3 mg/dL   Total Protein 6.8 6.0 - 8.5 g/dL   Albumin 4.1 3.8 - 4.8 g/dL   Globulin, Total 2.7 1.5 - 4.5 g/dL   Albumin/Globulin Ratio 1.5 1.2 - 2.2   Bilirubin Total 0.2 0.0 - 1.2 mg/dL   Alkaline Phosphatase 82 44 - 121 IU/L   AST 10 0 - 40 IU/L   ALT 16 0 - 32 IU/L  Lipid Profile  Result Value Ref Range   Cholesterol, Total 147 100 - 199 mg/dL   Triglycerides 136 0 - 149 mg/dL   HDL 38 (L) >39 mg/dL   VLDL Cholesterol Cal 24 5 - 40 mg/dL   LDL Chol Calc (NIH) 85 0 - 99 mg/dL   Chol/HDL Ratio 3.9 0.0 - 4.4 ratio  HgB A1c  Result Value Ref Range   Hgb A1c MFr Bld 9.3 (H) 4.8 - 5.6 %   Est. 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  Discussed with patient and her daughter about switching from Trulicity to [employer].  Both agree to the switch.  Continue with Tyler Aas 40u.  Follow up in 1 month.  Call sooner if concerns arise.         Nervous and Auditory   Mild dementia  without behavioral disturbance, psychotic disturbance, mood disturbance, or anxiety (HCC)    Chronic. MMSE 20/30 in office today.  Last year at this time was 28/30.  Discussed dementia with patient and  daughter during visit.  Discussed referral to Neurology.        Relevant Orders   Ambulatory referral to Neurology     Follow up plan: No follow-ups on file.

## 2023-02-26 ENCOUNTER — Telehealth: Payer: Medicare Other

## 2023-03-01 ENCOUNTER — Telehealth: Payer: Self-pay | Admitting: Nurse Practitioner

## 2023-03-01 NOTE — Telephone Encounter (Signed)
Forms have been re-faxed along with office visit note.

## 2023-03-01 NOTE — Telephone Encounter (Signed)
Copied from [place]. Topic: General - Other >> Mar 01, 2023 11:34 AM Everette C wrote: Reason for CRM: Alex with  [facility] has called to follow up on previous resubmission of previously submitted Freestyle Libre prescription and most recent office visit notes   There was an issue with the paperwork's legibility and visibility  Please resubmit both pages when possible to [phone] or [phone]

## 2023-03-02 ENCOUNTER — Other Ambulatory Visit: Payer: Self-pay | Admitting: Nurse Practitioner

## 2023-03-02 DIAGNOSIS — E7800 Pure hypercholesterolemia, unspecified: Secondary | ICD-10-CM

## 2023-03-02 DIAGNOSIS — E1159 Type 2 diabetes mellitus with other circulatory complications: Secondary | ICD-10-CM

## 2023-03-05 NOTE — Telephone Encounter (Signed)
Unable to refill per protocol, Rx request for lovastatin is too soon. Last refill 05/30/22 for 100 and 2 refills. For request for hydralazine Patient not taking: Reported on 01/23/2023.  Requested Prescriptions  Pending Prescriptions Disp Refills   lovastatin (MEVACOR) 40 MG tablet [Pharmacy Med Name: Lovastatin 40 MG Oral Tablet] 100 tablet 2    Sig: TAKE 1 TABLET BY MOUTH DAILY  WITH BREAKFAST     Cardiovascular:  Antilipid - Statins 2 Failed - 03/02/2023 10:41 PM      Failed - Lipid Panel in normal range within the last 12 months    Cholesterol, Total  Date Value Ref Range Status  12/07/2022 147 100 - 199 mg/dL Final   Cholesterol Piccolo, Waived  Date Value Ref Range Status  12/04/2018 137 <200 mg/dL Final    Comment:                            Desirable                <200                         Borderline High      200- 239                         High                     >239    LDL Chol Calc (NIH)  Date Value Ref Range Status  12/07/2022 85 0 - 99 mg/dL Final   HDL  Date Value Ref Range Status  12/07/2022 38 (L) >39 mg/dL Final   Triglycerides  Date Value Ref Range Status  12/07/2022 136 0 - 149 mg/dL Final   Triglycerides Piccolo,Waived  Date Value Ref Range Status  12/04/2018 262 (H) <150 mg/dL Final    Comment:                            Normal                   <150                         Borderline High     150 - 199                         High                200 - 499                         Very High                >499          Passed - Cr in normal range and within 360 days    Creatinine, Ser  Date Value Ref Range Status  12/07/2022 0.79 0.57 - 1.00 mg/dL Final         Passed - Patient is not pregnant      Passed - Valid encounter within last 12 months    Recent Outpatient Visits           1 month ago DM type 2 with diabetic peripheral neuropathy ([place])   [place]  [facility] Jon Billings, NP   2 months ago Hypertension  associated with diabetes [facility])   [place] Jon Billings, NP   6 months ago DM type 2 with diabetic peripheral neuropathy [facility])   [place], Megan P, DO   6 months ago Vitamin D deficiency   [place], Megan P, DO   10 months ago Annual physical exam   [place] Jon Billings, NP       Future Appointments             In 3 days Jon Billings, NP [place], [facility]             hydrALAZINE (APRESOLINE) 10 MG tablet [Pharmacy Med Name: hydrALAZINE HCl 10 MG Oral Tablet] 200 tablet 2    Sig: TAKE 1 TABLET BY MOUTH TWICE  DAILY     Cardiovascular:  Vasodilators Failed - 03/02/2023 10:41 PM      Failed - RBC in normal range and within 360 days    RBC  Date Value Ref Range Status  05/05/2022 5.53 (H) 3.77 - 5.28 x10E6/uL Final         Failed - ANA Screen, Ifa, Serum in normal range and within 360 days    No results found for: "ANA", "ANATITER", "LABANTI"       Failed - Last BP in normal range    BP Readings from Last 1 Encounters:  01/23/23 (!) 165/72         Passed - HCT in normal range and within 360 days    Hematocrit  Date Value Ref Range Status  05/05/2022 44.0 34.0 - 46.6 % Final         Passed - HGB in normal range and within 360 days    Hemoglobin  Date Value Ref Range Status  05/05/2022 14.3 11.1 - 15.9 g/dL Final         Passed - WBC in normal range and within 360 days    WBC  Date Value Ref Range Status  05/05/2022 8.1 3.4 - 10.8 x10E3/uL Final         Passed - PLT in normal range and within 360 days    Platelets  Date Value Ref Range Status  05/05/2022 228 150 - 450 x10E3/uL Final         Passed - Valid encounter within last 12 months    Recent Outpatient Visits           1 month ago DM type 2 with diabetic peripheral neuropathy [facility])   [place] Jon Billings, NP   2 months ago Hypertension associated with diabetes [facility])   [place], Karen, NP   6 months ago DM type 2 with diabetic peripheral neuropathy [facility])   [place], Megan P, DO   6 months ago Vitamin D deficiency   [place], Megan P, DO   10 months ago Annual physical exam   [place] Jon Billings, NP       Future Appointments             In 3 days Jon Billings, NP [place], [facility]

## 2023-03-05 NOTE — Telephone Encounter (Signed)
Unable to refill per protocol, Rx request is too soon. Last refill 03/28/22 for 90 days and 4 refills.  Requested Prescriptions  Pending Prescriptions Disp Refills   atenolol (TENORMIN) 100 MG tablet [Pharmacy Med Name: Atenolol 100 MG Oral Tablet] 100 tablet 2    Sig: TAKE 1 TABLET BY MOUTH DAILY     Cardiovascular: Beta Blockers 2 Failed - 03/02/2023 10:41 PM      Failed - Last BP in normal range    BP Readings from Last 1 Encounters:  01/23/23 (!) 165/72         Passed - Cr in normal range and within 360 days    Creatinine, Ser  Date Value Ref Range Status  12/07/2022 0.79 0.57 - 1.00 mg/dL Final         Passed - Last Heart Rate in normal range    Pulse Readings from Last 1 Encounters:  01/23/23 83         Passed - Valid encounter within last 6 months    Recent Outpatient Visits           1 month ago DM type 2 with diabetic peripheral neuropathy ([place])   [place] Jon Billings, NP   2 months ago Hypertension associated with diabetes [facility])   [place] Jon Billings, NP   6 months ago DM type 2 with diabetic peripheral neuropathy [facility])   [place], Megan P, DO   6 months ago Vitamin D deficiency   [place], Megan P, DO   10 months ago Annual physical exam   [place] Jon Billings, NP       Future Appointments             In 3 days Jon Billings, NP [place], [facility]             amLODipine ([place]) 10 MG tablet [Pharmacy Med Name: amLODIPine Besylate 10 MG Oral Tablet] 100 tablet 2    Sig: TAKE 1 TABLET BY MOUTH DAILY     Cardiovascular: Calcium Channel Blockers 2 Failed - 03/02/2023 10:41 PM      Failed - Last BP in normal range    BP Readings from Last 1 Encounters:  01/23/23 (!) 165/72         Passed - Last Heart Rate in normal range    Pulse Readings from Last 1  Encounters:  01/23/23 83         Passed - Valid encounter within last 6 months    Recent Outpatient Visits           1 month ago DM type 2 with diabetic peripheral neuropathy [facility])   [place], NP   2 months ago Hypertension associated with diabetes [facility])   [place], NP   6 months ago DM type 2 with diabetic peripheral neuropathy [facility])   [place], Megan P, DO   6 months ago Vitamin D deficiency   [place], Megan P, DO   10 months ago Annual physical exam   [place] Jon Billings, NP       Future Appointments             In 3 days Jon Billings, NP [place], [facility]

## 2023-03-08 ENCOUNTER — Ambulatory Visit: Payer: 59 | Admitting: Nurse Practitioner

## 2023-03-09 ENCOUNTER — Ambulatory Visit: Payer: 59 | Admitting: Nurse Practitioner

## 2023-03-12 ENCOUNTER — Telehealth: Payer: Self-pay | Admitting: Nurse Practitioner

## 2023-03-12 NOTE — Telephone Encounter (Signed)
Copied from [place] [id number]. Topic: General - Other >> Mar 12, 2023  1:20 PM Ja-Kwan M wrote: Reason for CRM: Elta [place] with [employer] requests last office notes to be faxed to [phone]

## 2023-03-14 ENCOUNTER — Encounter: Payer: Self-pay | Admitting: Nurse Practitioner

## 2023-03-14 ENCOUNTER — Ambulatory Visit (INDEPENDENT_AMBULATORY_CARE_PROVIDER_SITE_OTHER): Payer: 59 | Admitting: Nurse Practitioner

## 2023-03-14 VITALS — BP 136/69 | HR 88 | Temp 97.8°F | Wt 179.2 lb

## 2023-03-14 DIAGNOSIS — E1169 Type 2 diabetes mellitus with other specified complication: Secondary | ICD-10-CM | POA: Diagnosis not present

## 2023-03-14 DIAGNOSIS — N1831 Chronic kidney disease, stage 3a: Secondary | ICD-10-CM

## 2023-03-14 DIAGNOSIS — I152 Hypertension secondary to endocrine disorders: Secondary | ICD-10-CM

## 2023-03-14 DIAGNOSIS — E785 Hyperlipidemia, unspecified: Secondary | ICD-10-CM

## 2023-03-14 DIAGNOSIS — E1159 Type 2 diabetes mellitus with other circulatory complications: Secondary | ICD-10-CM | POA: Diagnosis not present

## 2023-03-14 DIAGNOSIS — D692 Other nonthrombocytopenic purpura: Secondary | ICD-10-CM

## 2023-03-14 DIAGNOSIS — F03A0 Unspecified dementia, mild, without behavioral disturbance, psychotic disturbance, mood disturbance, and anxiety: Secondary | ICD-10-CM | POA: Diagnosis not present

## 2023-03-14 DIAGNOSIS — E1142 Type 2 diabetes mellitus with diabetic polyneuropathy: Secondary | ICD-10-CM

## 2023-03-14 NOTE — Progress Notes (Unsigned)
BP 136/69 (BP Location: Left Arm, Cuff Size: Normal)   Pulse 88   Temp 97.8 F (36.6 C) (Oral)   Wt 179 lb 3.2 oz (81.3 kg)   SpO2 96%   BMI 35.00 kg/m    Subjective:    [name], female    DOB: [birth year], 77 y.o.   MRN: [id number]  HPI: Tami Cummings is a 77 y.o. female  Chief Complaint  Patient presents with   Diabetes   HYPERTENSION / HYPERLIPIDEMIA Satisfied with current treatment? no Duration of hypertension: years BP monitoring frequency: not checking BP range:  BP medication side effects: no Past BP meds: amlodipine and atenolol, hydralazine, and sprinolactone Duration of hyperlipidemia: years Cholesterol medication side effects: no Cholesterol supplements: none Past cholesterol medications: lovastatin (mevacor) Medication compliance: excellent compliance Aspirin: no Recent stressors: no Recurrent headaches: no Visual changes: no Palpitations: no Dyspnea: no Chest pain: no Lower extremity edema: no Dizzy/lightheaded: no  DIABETES Patient's Last A1c was 9.3 in December.  She states she didn't start the [place].  She isn't sure why.   Hypoglycemic episodes:no Polydipsia/polyuria: no Visual disturbance: no Chest pain: no Paresthesias: no Glucose Monitoring: yes  Accucheck frequency: daily  Fasting glucose: not sure what they are running  Post prandial:  Evening:  Before meals: Taking Insulin?: yes  Long acting insulin: she doesn't know how much she is using.  Short acting insulin:  Blood Pressure Monitoring: not checking Retinal Examination: Not up to Date Foot Exam: Up to Date Diabetic Education: Not Completed Pneumovax: Up to Date Influenza: Up to Date Aspirin: no  She recently saw the Neurologist but doesn't know why she saw them.  States she did start the Apricept.     Relevant past medical, surgical, family and social history reviewed and updated as indicated. Interim medical history since our last visit  reviewed. Allergies and medications reviewed and updated.  Review of Systems  Eyes:  Negative for visual disturbance.  Respiratory:  Negative for cough, chest tightness and shortness of breath.   Cardiovascular:  Negative for chest pain, palpitations and leg swelling.  Endocrine: Negative for polydipsia and polyuria.  Neurological:  Negative for dizziness, numbness and headaches.    Per HPI unless specifically indicated above     Objective:    BP 136/69 (BP Location: Left Arm, Cuff Size: Normal)   Pulse 88   Temp 97.8 F (36.6 C) (Oral)   Wt 179 lb 3.2 oz (81.3 kg)   SpO2 96%   BMI 35.00 kg/m   Wt Readings from Last 3 Encounters:  03/14/23 179 lb 3.2 oz (81.3 kg)  01/23/23 178 lb 8 oz (81 kg)  12/07/22 185 lb 4.8 oz (84.1 kg)    Physical Exam Vitals and nursing note reviewed.  Constitutional:      General: She is not in acute distress.    Appearance: Normal appearance. She is obese. She is not ill-appearing, toxic-appearing or diaphoretic.  HENT:     Head: Normocephalic.     Right Ear: External ear normal.     Left Ear: External ear normal.     Nose: Nose normal.     Mouth/Throat:     Mouth: Mucous membranes are moist.     Pharynx: Oropharynx is clear.  Eyes:     General:        Right eye: No discharge.        Left eye: No discharge.     Extraocular Movements: Extraocular movements intact.  Conjunctiva/sclera: Conjunctivae normal.     Pupils: Pupils are equal, round, and reactive to light.  Cardiovascular:     Rate and Rhythm: Normal rate and regular rhythm.     Heart sounds: No murmur heard. Pulmonary:     Effort: Pulmonary effort is normal. No respiratory distress.     Breath sounds: Normal breath sounds. No wheezing or rales.  Musculoskeletal:     Cervical back: Normal range of motion and neck supple.  Skin:    General: Skin is warm and dry.     Capillary Refill: Capillary refill takes less than 2 seconds.  Neurological:     General: No focal deficit  present.     Mental Status: She is alert and oriented to person, place, and time. Mental status is at baseline.  Psychiatric:        Mood and Affect: Mood normal.        Behavior: Behavior normal.        Thought Content: Thought content normal.        Judgment: Judgment normal.     Results for orders placed or performed in visit on 03/14/23  Comp Met (CMET)  Result Value Ref Range   Glucose 457 (H) 70 - 99 mg/dL   BUN 15 8 - 27 mg/dL   Creatinine, Ser 0.85 0.57 - 1.00 mg/dL   eGFR 71 >59 mL/min/1.73   BUN/Creatinine Ratio 18 12 - 28   Sodium 136 134 - 144 mmol/L   Potassium 4.4 3.5 - 5.2 mmol/L   Chloride 98 96 - 106 mmol/L   CO2 19 (L) 20 - 29 mmol/L   Calcium 9.6 8.7 - 10.3 mg/dL   Total Protein 6.4 6.0 - 8.5 g/dL   Albumin 4.1 3.8 - 4.8 g/dL   Globulin, Total 2.3 1.5 - 4.5 g/dL   Albumin/Globulin Ratio 1.8 1.2 - 2.2   Bilirubin Total <0.2 0.0 - 1.2 mg/dL   Alkaline Phosphatase 114 44 - 121 IU/L   AST 12 0 - 40 IU/L   ALT 14 0 - 32 IU/L  Lipid Profile  Result Value Ref Range   Cholesterol, Total 138 100 - 199 mg/dL   Triglycerides 243 (H) 0 - 149 mg/dL   HDL 40 >39 mg/dL   VLDL Cholesterol Cal 39 5 - 40 mg/dL   LDL Chol Calc (NIH) 59 0 - 99 mg/dL   Chol/HDL Ratio 3.5 0.0 - 4.4 ratio  HgB A1c  Result Value Ref Range   Hgb A1c MFr Bld 11.1 (H) 4.8 - 5.6 %   Est. average glucose Bld gHb Est-mCnc 272 mg/dL      Assessment & Plan:   Problem List Items Addressed This Visit       Cardiovascular and Mediastinum   Hypertension associated with diabetes ([place]) - Primary    Chronic.  Controlled.  Continue with current medication regimen of Hydralazine 10mg  BID- patient states she is taking the Hydralazine, Spirnolactone 25mg , Amlodipine 10mg , and Atenolol 100mg .  Labs ordered today.  Return to clinic in 3 months for reevaluation.  Call sooner if concerns arise.         Relevant Orders   Comp Met (CMET) (Completed)   Senile purpura ([place])    Reassurance provided today.  No current concerns.        Endocrine   DM type 2 with diabetic peripheral neuropathy (HCC)    Chronic.  Patient is confused about medications.  I spoke with the daughter with patient's permission after the  appointment.  Discussed stopping the Trulicity and starting the Ozempic which she did pick up but hasn't started.  Discussed helping her mom dial the insulin to 40u.  Patient did not know how many units she was giving herself.  Continue with Tyler Aas 40u.  Labs ordered today.  Follow up in 3 months.  Call sooner if concerns arise.       Relevant Medications   donepezil (ARICEPT) 5 MG tablet   Other Relevant Orders   HgB A1c (Completed)   Hyperlipidemia associated with type 2 diabetes mellitus (HCC)    Chronic.  Controlled.  Continue with current medication regimen of Lovastatin 40mg  daily.  Labs ordered today.  Return to clinic in 3 months for reevaluation.  Call sooner if concerns arise.        Relevant Orders   Lipid Profile (Completed)     Nervous and Auditory   Mild dementia without behavioral disturbance, psychotic disturbance, mood disturbance, or anxiety ([place])    Recently seen by Neurology and started on Aricept.  Patient endorses taking the medication however, she wasn't sure what doctor gave it to her and why.  Discussed this with patient during visit today.  Recommend continuing with the Aricept.  Discussed with patient's daughter to help make sure patient is taking her medications appropriately.       Relevant Medications   donepezil (ARICEPT) 5 MG tablet     Genitourinary   CKD (chronic kidney disease) stage 3, GFR 30-59 ml/min (HCC)    Chronic.  Labs ordered today. Will make recommendations based on lab results. Will make medication adjustments as needed. Follow up in 3 months.  Call sooner if concerns arise.         Other   Morbid obesity ([place])    Recommended eating smaller high protein, low fat meals more frequently and exercising 30 mins a day 5 times a week  with a goal of 10-15lb weight loss in the next 3 months.         Follow up plan: Return in about 3 months (around 06/14/2023) for HTN, HLD, DM2 FU.

## 2023-03-15 LAB — LIPID PANEL
Chol/HDL Ratio: 3.5 ratio (ref 0.0–4.4)
Cholesterol, Total: 138 mg/dL (ref 100–199)
HDL: 40 mg/dL (ref >39)
LDL Chol Calc (NIH): 59 mg/dL (ref 0–99)
Triglycerides: 243 mg/dL — ABNORMAL HIGH (ref 0–149)
VLDL Cholesterol Cal: 39 mg/dL (ref 5–40)

## 2023-03-15 LAB — COMPREHENSIVE METABOLIC PANEL
ALT: 14 IU/L (ref 0–32)
AST: 12 IU/L (ref 0–40)
Albumin/Globulin Ratio: 1.8 (ref 1.2–2.2)
Albumin: 4.1 g/dL (ref 3.8–4.8)
Alkaline Phosphatase: 114 IU/L (ref 44–121)
BUN/Creatinine Ratio: 18 (ref 12–28)
BUN: 15 mg/dL (ref 8–27)
Bilirubin Total: <0.2 mg/dL (ref 0.0–1.2)
CO2: 19 mmol/L — ABNORMAL LOW (ref 20–29)
Calcium: 9.6 mg/dL (ref 8.7–10.3)
Chloride: 98 mmol/L (ref 96–106)
Creatinine, Ser: 0.85 mg/dL (ref 0.57–1.00)
Globulin, Total: 2.3 g/dL (ref 1.5–4.5)
Glucose: 457 mg/dL — ABNORMAL HIGH (ref 70–99)
Potassium: 4.4 mmol/L (ref 3.5–5.2)
Sodium: 136 mmol/L (ref 134–144)
Total Protein: 6.4 g/dL (ref 6.0–8.5)
eGFR: 71 mL/min/{1.73_m2} (ref >59)

## 2023-03-15 LAB — HEMOGLOBIN A1C
Est. average glucose Bld gHb Est-mCnc: 272 mg/dL
Hgb A1c MFr Bld: 11.1 % — ABNORMAL HIGH (ref 4.8–5.6)

## 2023-03-15 NOTE — Assessment & Plan Note (Signed)
Recommended eating smaller high protein, low fat meals more frequently and exercising 30 mins a day 5 times a week with a goal of 10-15lb weight loss in the next 3 months.  

## 2023-03-15 NOTE — Assessment & Plan Note (Signed)
Chronic.  Labs ordered today. Will make recommendations based on lab results. Will make medication adjustments as needed. Follow up in 3 months.  Call sooner if concerns arise.  

## 2023-03-15 NOTE — Assessment & Plan Note (Signed)
Chronic.  Controlled.  Continue with current medication regimen of Lovastatin 40mg daily.  Labs ordered today.  Return to clinic in 3 months for reevaluation.  Call sooner if concerns arise.   

## 2023-03-15 NOTE — Assessment & Plan Note (Signed)
Chronic.  Controlled.  Continue with current medication regimen of Hydralazine 10mg  BID- patient states she is taking the Hydralazine, Spirnolactone 25mg , Amlodipine 10mg , and Atenolol 100mg .  Labs ordered today.  Return to clinic in 3 months for reevaluation.  Call sooner if concerns arise.

## 2023-03-15 NOTE — Assessment & Plan Note (Signed)
Chronic.  Patient is confused about medications.  I spoke with the daughter with patient's permission after the appointment.  Discussed stopping the Trulicity and starting the Ozempic which she did pick up but hasn't started.  Discussed helping her mom dial the insulin to 40u.  Patient did not know how many units she was giving herself.  Continue with Tyler Aas 40u.  Labs ordered today.  Follow up in 3 months.  Call sooner if concerns arise.

## 2023-03-15 NOTE — Assessment & Plan Note (Signed)
Recently seen by Neurology and started on Aricept.  Patient endorses taking the medication however, she wasn't sure what doctor gave it to her and why.  Discussed this with patient during visit today.  Recommend continuing with the Aricept.  Discussed with patient's daughter to help make sure patient is taking her medications appropriately.

## 2023-03-15 NOTE — Progress Notes (Signed)
Please let patient's daughter Louie Casa that her lab work shows that her A1c increased to 11.1.  I want her to continue with the T[place] 40u and change to Ozempic.  I am hesitant to increase her dose of medication at this time since I am not confident she is taking enough insulin.  Other lab work looks good.  No other concerns at this time.

## 2023-03-15 NOTE — Assessment & Plan Note (Signed)
Reassurance provided today. No current concerns. 

## 2023-03-30 ENCOUNTER — Telehealth: Payer: Self-pay

## 2023-03-30 NOTE — Telephone Encounter (Signed)
Copied from CRM [id number]. Topic: General - Inquiry >> Mar 30, 2023 12:24 PM Marlow Baars wrote: Reason for CRM: Alex with [employer] called in about two previous faxes that he says were sent on 04/04 and 04/11 regarding a freestyle libre monitoring system for the patient. He says it was a form that he needed the provider to fill out. Please assist further

## 2023-04-02 ENCOUNTER — Other Ambulatory Visit: Payer: Self-pay

## 2023-04-02 NOTE — Telephone Encounter (Signed)
Form was completed and faxed back today.

## 2023-04-02 NOTE — Telephone Encounter (Signed)
Alex from [facility] called in checking status of form faxed over for freestyle libre. Please call back if received. This was faxed 3 times, the lat time was April 11

## 2023-04-02 NOTE — Telephone Encounter (Signed)
error 

## 2023-04-27 ENCOUNTER — Telehealth: Payer: Self-pay | Admitting: Nurse Practitioner

## 2023-04-27 NOTE — Telephone Encounter (Signed)
Copied from CRM [id number]. Topic: Medical Record Request - Other >> Apr 27, 2023 10:05 AM Marlow Baars wrote: Reason for CRM: Tami Cummings with [facility] called in stating they never received the fax from April regarding the form for the [facility]. She is going to fax the form back over and needs it to be returned via the fax on the form with an [place] area code of [phone]. Please assist further

## 2023-04-27 NOTE — Telephone Encounter (Signed)
Copied from CRM #[id number]. Topic: Medical Record Request - Other >> Apr 27, 2023 10:05 AM Jason S wrote: Reason for CRM: Janice with [employer] called in stating they never received the fax from April regarding the form for the Freestyle Libre. She is going to fax the form back over and needs it to be returned via the fax on the form with an [place] area code of [phone]. Please assist further 

## 2023-05-01 ENCOUNTER — Other Ambulatory Visit: Payer: Self-pay | Admitting: Family Medicine

## 2023-05-01 NOTE — Telephone Encounter (Addendum)
Ann from [facility] stated she is calling to f/u on fax sent on May 13th. Order for [employer] 2.   Please advise.

## 2023-05-01 NOTE — Telephone Encounter (Signed)
The original prescription was discontinued on 12/08/2022 by Larae Grooms, NP for the following reason: Dose change   Requested Prescriptions  Refused Prescriptions Disp Refills   TRULICITY 4.5 MG/0.5ML SOPN [Pharmacy Med Name: Trulicity 4.5 MG/0.5ML Subcutaneous Solution Pen-injector] 12 mL 0    Sig: INJECT 4.5MG  AS DIRECTED ONCE A WEEK     Endocrinology:  Diabetes - GLP-1 Receptor Agonists Failed - 05/01/2023  5:22 PM      Failed - HBA1C is between 0 and 7.9 and within 180 days    Hemoglobin A1C  Date Value Ref Range Status  07/13/2016 7.9  Final   HB A1C (BAYER DCA - WAIVED)  Date Value Ref Range Status  08/15/2022 9.0 (H) 4.8 - 5.6 % Final    Comment:             Prediabetes: 5.7 - 6.4          Diabetes: >6.4          Glycemic control for adults with diabetes: <7.0    Hgb A1c MFr Bld  Date Value Ref Range Status  03/14/2023 11.1 (H) 4.8 - 5.6 % Final    Comment:             Prediabetes: 5.7 - 6.4          Diabetes: >6.4          Glycemic control for adults with diabetes: <7.0          Passed - Valid encounter within last 6 months    Recent Outpatient Visits           1 month ago Hypertension associated with diabetes [facility])   [place] [facility] Larae Grooms, NP   3 months ago DM type 2 with diabetic peripheral neuropathy [facility])   [place] [facility] Larae Grooms, NP   4 months ago Hypertension associated with diabetes [facility])   [place] [facility] Larae Grooms, NP   8 months ago DM type 2 with diabetic peripheral neuropathy [facility])   [place] [facility] [place], Megan P, DO   8 months ago Vitamin D deficiency   [place] [facility] Dorcas Carrow, DO       Future Appointments             In 6 days Larae Grooms, NP [place] [facility]   In 1 month Mecum, Oswaldo Conroy, PA-C [place] [facility]

## 2023-05-01 NOTE — Telephone Encounter (Signed)
Called and let Dewayne Hatch know that the paperwork was completed and faxed back yesterday afternoon.

## 2023-05-02 ENCOUNTER — Other Ambulatory Visit: Payer: Self-pay | Admitting: Nurse Practitioner

## 2023-05-02 MED ORDER — TRESIBA FLEXTOUCH 100 UNIT/ML ~~LOC~~ SOPN: PEN_INJECTOR | SUBCUTANEOUS | 0 refills | Status: DC

## 2023-05-02 NOTE — Telephone Encounter (Signed)
Pt is calling in because she wants to refill her insulin medication. Pt doesn't know the name of the medication and couldn't identify it by name when reading the list off to her. Pt says Clydie Braun will know exactly what medication she is talking about and is requesting she send it in for her. Please advise.

## 2023-05-07 ENCOUNTER — Ambulatory Visit (INDEPENDENT_AMBULATORY_CARE_PROVIDER_SITE_OTHER): Payer: Medicare HMO | Admitting: Nurse Practitioner

## 2023-05-07 ENCOUNTER — Encounter: Payer: Self-pay | Admitting: Nurse Practitioner

## 2023-05-07 VITALS — BP 133/73 | HR 77 | Temp 97.9°F | Ht 61.0 in | Wt 179.0 lb

## 2023-05-07 DIAGNOSIS — E559 Vitamin D deficiency, unspecified: Secondary | ICD-10-CM

## 2023-05-07 DIAGNOSIS — D692 Other nonthrombocytopenic purpura: Secondary | ICD-10-CM

## 2023-05-07 DIAGNOSIS — E538 Deficiency of other specified B group vitamins: Secondary | ICD-10-CM | POA: Diagnosis not present

## 2023-05-07 DIAGNOSIS — Z0000 Encounter for general adult medical examination without abnormal findings: Secondary | ICD-10-CM

## 2023-05-07 DIAGNOSIS — N1831 Chronic kidney disease, stage 3a: Secondary | ICD-10-CM | POA: Diagnosis not present

## 2023-05-07 DIAGNOSIS — F03A0 Unspecified dementia, mild, without behavioral disturbance, psychotic disturbance, mood disturbance, and anxiety: Secondary | ICD-10-CM | POA: Diagnosis not present

## 2023-05-07 DIAGNOSIS — I152 Hypertension secondary to endocrine disorders: Secondary | ICD-10-CM | POA: Diagnosis not present

## 2023-05-07 DIAGNOSIS — E1159 Type 2 diabetes mellitus with other circulatory complications: Secondary | ICD-10-CM

## 2023-05-07 DIAGNOSIS — E1169 Type 2 diabetes mellitus with other specified complication: Secondary | ICD-10-CM | POA: Diagnosis not present

## 2023-05-07 DIAGNOSIS — E1142 Type 2 diabetes mellitus with diabetic polyneuropathy: Secondary | ICD-10-CM | POA: Diagnosis not present

## 2023-05-07 DIAGNOSIS — E785 Hyperlipidemia, unspecified: Secondary | ICD-10-CM | POA: Diagnosis not present

## 2023-05-07 DIAGNOSIS — E7800 Pure hypercholesterolemia, unspecified: Secondary | ICD-10-CM

## 2023-05-07 MED ORDER — AMLODIPINE BESYLATE 10 MG PO TABS: 10.0000 mg | ORAL_TABLET | Freq: Every day | ORAL | 4 refills | Status: DC

## 2023-05-07 MED ORDER — EMPAGLIFLOZIN 25 MG PO TABS: 25.0000 mg | ORAL_TABLET | Freq: Every day | ORAL | 1 refills | Status: DC

## 2023-05-07 MED ORDER — LOVASTATIN 40 MG PO TABS: 40.0000 mg | ORAL_TABLET | Freq: Every day | ORAL | 1 refills | Status: DC

## 2023-05-07 MED ORDER — HYDRALAZINE HCL 10 MG PO TABS: 10.0000 mg | ORAL_TABLET | Freq: Two times a day (BID) | ORAL | 1 refills | Status: DC

## 2023-05-07 MED ORDER — ATENOLOL 100 MG PO TABS: 100.0000 mg | ORAL_TABLET | Freq: Every day | ORAL | 1 refills | Status: DC

## 2023-05-07 MED ORDER — SPIRONOLACTONE 25 MG PO TABS
25.0000 mg | ORAL_TABLET | Freq: Every day | ORAL | 1 refills | Status: AC
Start: 2023-05-07 — End: ?

## 2023-05-07 NOTE — Assessment & Plan Note (Signed)
Chronic.  Controlled.  Continue with current medication regimen of Hydralazine 10mg  BID- patient states she is taking the Hydralazine, Spirnolactone 25mg , Amlodipine 10mg , and Atenolol 100mg .  No one accompanied patient at visit today.  She does not know the names of her medications.  Labs ordered today.  Return to clinic in 3 months for reevaluation.  Call sooner if concerns arise.

## 2023-05-07 NOTE — Assessment & Plan Note (Signed)
Labs ordered at visit today.  Will make recommendations based on lab results.   

## 2023-05-07 NOTE — Assessment & Plan Note (Signed)
Chronic.  Patient is confused about medications.  She does not know what she is taking or how many units of insulin she is taking.  Daughter did not accompany patient at visit today.   Will await results and discuss medications with daughter and how many units of insulin patient should be taking.  Continue with Evaristo Bury 40u.  Labs ordered today.  Follow up in 3 months.  Call sooner if concerns arise.

## 2023-05-07 NOTE — Progress Notes (Signed)
BP 133/73   Pulse 77   Temp 97.9 F (36.6 C) (Oral)   Ht 5\' 1"  (1.549 m)   Wt 179 lb (81.2 kg)   SpO2 99%   BMI 33.82 kg/m    Subjective:    [name], female    DOB: 1946/01/20, 77 y.o.   MRN: [id number]  HPI: Tami Cummings is a 77 y.o. female presenting on 05/07/2023 for comprehensive medical examination. Current medical complaints include:none  She currently lives with: Menopausal Symptoms: no  HYPERTENSION / HYPERLIPIDEMIA Satisfied with current treatment? no Duration of hypertension: years BP monitoring frequency: not checking BP range:  BP medication side effects: no Past BP meds: amlodipine and atenolol, hydralazine, and sprinolactone Duration of hyperlipidemia: years Cholesterol medication side effects: no Cholesterol supplements: none Past cholesterol medications: lovastatin (mevacor) Medication compliance: excellent compliance Aspirin: no Recent stressors: no Recurrent headaches: no Visual changes: no Palpitations: no Dyspnea: no Chest pain: no Lower extremity edema: no Dizzy/lightheaded: no  DIABETES Patient is by herself today.  She is a poor historian.  Not sure how many units of insulin she is giving herself.  She checks her sugars but doesn't know what they are.   Hypoglycemic episodes:no Polydipsia/polyuria: no Visual disturbance: no Chest pain: no Paresthesias: no Glucose Monitoring: yes  Accucheck frequency: daily  Fasting glucose: not sure of the numbers  Post prandial:  Evening:  Before meals: Taking Insulin?: no  Long acting insulin:  Short acting insulin: Blood Pressure Monitoring: not checking Retinal Examination: Not up to Date Foot Exam: Up to Date Diabetic Education: Not Completed Pneumovax: Up to Date Influenza: Up to Date Aspirin: no  CHRONIC KIDNEY DISEASE CKD status: controlled Medications renally dose: yes Previous renal evaluation: no Pneumovax:  Up to Date Influenza Vaccine:  Up to  Date  Depression Screen done today and results listed below:     05/07/2023    1:48 PM 03/14/2023    3:23 PM 08/15/2022    4:15 PM 08/08/2022   10:10 AM 05/05/2022    2:18 PM  Depression screen PHQ 2/9  Decreased Interest 0 0 0 0 0  Down, Depressed, Hopeless 0 0 2 0 0  PHQ - 2 Score 0 0 2 0 0  Altered sleeping 1 0 0  0  Tired, decreased energy 0 1 0  1  Change in appetite 0 0 1  0  Feeling bad or failure about yourself  0 0 0  0  Trouble concentrating 0 1 0  0  Moving slowly or fidgety/restless 0 1 0  0  Suicidal thoughts 0 0 0  0  PHQ-9 Score 1 3 3  1   Difficult doing work/chores Not difficult at all Somewhat difficult Not difficult at all  Not difficult at all    The patient does not have a history of falls. I did complete a risk assessment for falls. A plan of care for falls was documented.   Past Medical History:  Past Medical History:  Diagnosis Date   Arthritis    knee   COPD (chronic obstructive pulmonary disease) (HCC)    Diabetes mellitus without complication (HCC)    type 2   Dyspnea    Hyperlipidemia    Hypertension    controlled   Neuromuscular disorder (HCC)    numbness in hands and feet   Shoulder pain, right    Wears dentures    upper and lower    Surgical History:  Past Surgical History:  Procedure Laterality Date   BREAST BIOPSY Right 04/12/2016   stereo bx/clip- neg   CARPAL TUNNEL RELEASE  06/17/2000   CATARACT EXTRACTION W/PHACO Left 09/20/2015   Procedure: CATARACT EXTRACTION PHACO AND INTRAOCULAR LENS PLACEMENT (IOC);  Surgeon: Sherald Hess, MD;  Location: [facility];  Service: Ophthalmology;  Laterality: Left;  DIABETIC - insulin   CATARACT EXTRACTION W/PHACO Right 06/03/2018   Procedure: CATARACT EXTRACTION PHACO AND INTRAOCULAR LENS PLACEMENT (IOC) RIGHT DIABETES;  Surgeon: Nevada Crane, MD;  Location: [facility];  Service: Ophthalmology;  Laterality: Right;  Diabetes-insulin dependent   CESAREAN SECTION      x2   CHOLECYSTECTOMY     EYE SURGERY     TOTAL ABDOMINAL HYSTERECTOMY W/ BILATERAL SALPINGOOPHORECTOMY      Medications:  Current Outpatient Medications on File Prior to Visit  Medication Sig   BD PEN NEEDLE MICRO U/F 32G X 6 MM MISC USE 4 TO 5 TIMES DAILY WITH INSULIN USE   Blood Glucose Monitoring Suppl (ONETOUCH VERIO) w/Device KIT Use to check blood sugar 2-3 times daily and document, bring results to provider visits.   glucose blood test strip Use to check blood sugar 2-3 times daily and document, bring results to provider visits.   insulin degludec (TRESIBA FLEXTOUCH) 100 UNIT/ML FlexTouch Pen INJECT 40 UNITS SUBCUTANEOUSLY ONCE DAILY   Lancets (ONETOUCH ULTRASOFT) lancets Use to check blood sugar 2-3 times daily and document, bring results to provider visits.   OneTouch Delica Lancets 33G MISC 1 applicator by Does not apply route in the morning and at bedtime.   Semaglutide,0.25 or 0.5MG /DOS, (OZEMPIC, 0.25 OR 0.5 MG/DOSE,) 2 MG/3ML SOPN Inject 0.25 mg into the skin once a week.   VITAMIN D, CHOLECALCIFEROL, PO Take 5,000 Units by mouth daily.   donepezil (ARICEPT) 5 MG tablet Take 5 mg by mouth at bedtime.   No current facility-administered medications on file prior to visit.    Allergies:  Allergies  Allergen Reactions   Actos [Pioglitazone] Other (See Comments)    CHF   Amlodipine Swelling    Leg swelling    Benazepril Other (See Comments)    angioedema   Hydrochlorothiazide Other (See Comments)    angioedema   Metformin And Related Diarrhea    Social History:  Social History   Socioeconomic History   Marital status: Single    Spouse name: Not on file   Number of children: Not on file   Years of education: Not on file   Highest education level: High school graduate  Occupational History   Occupation: retired   Tobacco Use   Smoking status: Former    Packs/day: 1.00    Years: 20.00    Additional pack years: 0.00    Total pack years: 20.00    Types:  Cigarettes    Quit date: 08/26/2013    Years since quitting: 9.7   Smokeless tobacco: Never  Vaping Use   Vaping Use: Never used  Substance and Sexual Activity   Alcohol use: No    Alcohol/week: 0.0 standard drinks of alcohol   Drug use: No   Sexual activity: Not Currently  Other Topics Concern   Not on file  Social History Narrative   Daughter lives with her currently    Takes care of granddaughter    Social Determinants of Health   Financial Resource Strain: Low Risk  (08/08/2022)   Overall Financial Resource Strain (CARDIA)    Difficulty of Paying Living Expenses: Not hard at all  Food Insecurity: No Food Insecurity (08/08/2022)   Hunger Vital Sign    Worried About Running Out of Food in the Last Year: Never true    Ran Out of Food in the Last Year: Never true  Transportation Needs: No Transportation Needs (05/22/2022)   PRAPARE - Administrator, Civil Service (Medical): No    Lack of Transportation (Non-Medical): No  Physical Activity: Inactive (08/08/2022)   Exercise Vital Sign    Days of Exercise per Week: 0 days    Minutes of Exercise per Session: 0 min  Stress: No Stress Concern Present (08/08/2022)   [employer] of Occupational Health - Occupational Stress Questionnaire    Feeling of Stress : Not at all  Social Connections: Unknown (08/08/2022)   Social Connection and Isolation Panel [NHANES]    Frequency of Communication with Friends and Family: Twice a week    Frequency of Social Gatherings with Friends and Family: Once a week    Attends Religious Services: Never    Database administrator or Organizations: No    Attends Banker Meetings: Never    Marital Status: Not on file  Recent Concern: Social Connections - Socially Isolated (08/08/2022)   Social Connection and Isolation Panel [NHANES]    Frequency of Communication with Friends and Family: Twice a week    Frequency of Social Gatherings with Friends and Family: Once a week    Attends  Religious Services: Never    Database administrator or Organizations: No    Attends Banker Meetings: Never    Marital Status: Divorced  Catering manager Violence: Not At Risk (08/08/2022)   Humiliation, Afraid, Rape, and Kick questionnaire    Fear of Current or Ex-Partner: No    Emotionally Abused: No    Physically Abused: No    Sexually Abused: No   Social History   Tobacco Use  Smoking Status Former   Packs/day: 1.00   Years: 20.00   Additional pack years: 0.00   Total pack years: 20.00   Types: Cigarettes   Quit date: 08/26/2013   Years since quitting: 9.7  Smokeless Tobacco Never   Social History   Substance and Sexual Activity  Alcohol Use No   Alcohol/week: 0.0 standard drinks of alcohol    Family History:  Family History  Problem Relation Age of Onset   Heart disease Father    Cancer Sister    Cancer Brother    Breast cancer Neg Hx     Past medical history, surgical history, medications, allergies, family history and social history reviewed with patient today and changes made to appropriate areas of the chart.   Review of Systems  HENT:         Denies vision changes.  Eyes:  Negative for blurred vision and double vision.  Respiratory:  Negative for shortness of breath.   Cardiovascular:  Negative for chest pain, palpitations and leg swelling.  Neurological:  Negative for dizziness, tingling and headaches.  Endo/Heme/Allergies:  Negative for polydipsia.       Denies Polyuria   All other ROS negative except what is listed above and in the HPI.      Objective:    BP 133/73   Pulse 77   Temp 97.9 F (36.6 C) (Oral)   Ht 5\' 1"  (1.549 m)   Wt 179 lb (81.2 kg)   SpO2 99%   BMI 33.82 kg/m   Wt Readings from Last 3 Encounters:  05/07/23 179  lb (81.2 kg)  03/14/23 179 lb 3.2 oz (81.3 kg)  01/23/23 178 lb 8 oz (81 kg)    Physical Exam Vitals and nursing note reviewed.  Constitutional:      General: She is awake. She is not in acute  distress.    Appearance: Normal appearance. She is well-developed. She is obese. She is not ill-appearing.  HENT:     Head: Normocephalic and atraumatic.     Right Ear: Hearing, tympanic membrane, ear canal and external ear normal. No drainage.     Left Ear: Hearing, tympanic membrane, ear canal and external ear normal. No drainage.     Nose: Nose normal.     Right Sinus: No maxillary sinus tenderness or frontal sinus tenderness.     Left Sinus: No maxillary sinus tenderness or frontal sinus tenderness.     Mouth/Throat:     Mouth: Mucous membranes are moist.     Pharynx: Oropharynx is clear. Uvula midline. No pharyngeal swelling, oropharyngeal exudate or posterior oropharyngeal erythema.  Eyes:     General: Lids are normal.        Right eye: No discharge.        Left eye: No discharge.     Extraocular Movements: Extraocular movements intact.     Conjunctiva/sclera: Conjunctivae normal.     Pupils: Pupils are equal, round, and reactive to light.     Visual Fields: Right eye visual fields normal and left eye visual fields normal.  Neck:     Thyroid: No thyromegaly.     Vascular: No carotid bruit.     Trachea: Trachea normal.  Cardiovascular:     Rate and Rhythm: Normal rate and regular rhythm.     Heart sounds: Normal heart sounds. No murmur heard.    No gallop.  Pulmonary:     Effort: Pulmonary effort is normal. No accessory muscle usage or respiratory distress.     Breath sounds: Normal breath sounds.  Chest:  Breasts:    Right: Normal.     Left: Normal.  Abdominal:     General: Bowel sounds are normal.     Palpations: Abdomen is soft. There is no hepatomegaly or splenomegaly.     Tenderness: There is no abdominal tenderness.  Musculoskeletal:        General: Normal range of motion.     Cervical back: Normal range of motion and neck supple.     Right lower leg: No edema.     Left lower leg: No edema.  Lymphadenopathy:     Head:     Right side of head: No submental,  submandibular, tonsillar, preauricular or posterior auricular adenopathy.     Left side of head: No submental, submandibular, tonsillar, preauricular or posterior auricular adenopathy.     Cervical: No cervical adenopathy.     Upper Body:     Right upper body: No supraclavicular, axillary or pectoral adenopathy.     Left upper body: No supraclavicular, axillary or pectoral adenopathy.  Skin:    General: Skin is warm and dry.     Capillary Refill: Capillary refill takes less than 2 seconds.     Findings: No rash.  Neurological:     Mental Status: She is alert and oriented to person, place, and time.     Gait: Gait is intact.     Deep Tendon Reflexes: Reflexes are normal and symmetric.     Reflex Scores:      Brachioradialis reflexes are 2+ on the right side  and 2+ on the left side.      Patellar reflexes are 2+ on the right side and 2+ on the left side. Psychiatric:        Attention and Perception: Attention normal.        Mood and Affect: Mood normal.        Speech: Speech normal.        Behavior: Behavior normal. Behavior is cooperative.        Thought Content: Thought content normal.        Judgment: Judgment normal.     Results for orders placed or performed in visit on 03/14/23  Comp Met (CMET)  Result Value Ref Range   Glucose 457 (H) 70 - 99 mg/dL   BUN 15 8 - 27 mg/dL   Creatinine, Ser 1.61 0.57 - 1.00 mg/dL   eGFR 71 >09 UE/AVW/0.98   BUN/Creatinine Ratio 18 12 - 28   Sodium 136 134 - 144 mmol/L   Potassium 4.4 3.5 - 5.2 mmol/L   Chloride 98 96 - 106 mmol/L   CO2 19 (L) 20 - 29 mmol/L   Calcium 9.6 8.7 - 10.3 mg/dL   Total Protein 6.4 6.0 - 8.5 g/dL   Albumin 4.1 3.8 - 4.8 g/dL   Globulin, Total 2.3 1.5 - 4.5 g/dL   Albumin/Globulin Ratio 1.8 1.2 - 2.2   Bilirubin Total <0.2 0.0 - 1.2 mg/dL   Alkaline Phosphatase 114 44 - 121 IU/L   AST 12 0 - 40 IU/L   ALT 14 0 - 32 IU/L  Lipid Profile  Result Value Ref Range   Cholesterol, Total 138 100 - 199 mg/dL    Triglycerides 119 (H) 0 - 149 mg/dL   HDL 40 >14 mg/dL   VLDL Cholesterol Cal 39 5 - 40 mg/dL   LDL Chol Calc (NIH) 59 0 - 99 mg/dL   Chol/HDL Ratio 3.5 0.0 - 4.4 ratio  HgB A1c  Result Value Ref Range   Hgb A1c MFr Bld 11.1 (H) 4.8 - 5.6 %   Est. average glucose Bld gHb Est-mCnc 272 mg/dL      Assessment & Plan:   Problem List Items Addressed This Visit       Cardiovascular and Mediastinum   Hypertension associated with diabetes (HCC)    Chronic.  Controlled.  Continue with current medication regimen of Hydralazine 10mg  BID- patient states she is taking the Hydralazine, Spirnolactone 25mg , Amlodipine 10mg , and Atenolol 100mg .  No one accompanied patient at visit today.  She does not know the names of her medications.  Labs ordered today.  Return to clinic in 3 months for reevaluation.  Call sooner if concerns arise.         Relevant Medications   amLODipine (NORVASC) 10 MG tablet   atenolol (TENORMIN) 100 MG tablet   empagliflozin (JARDIANCE) 25 MG TABS tablet   hydrALAZINE (APRESOLINE) 10 MG tablet   lovastatin (MEVACOR) 40 MG tablet   spironolactone (ALDACTONE) 25 MG tablet   Other Relevant Orders   Comprehensive metabolic panel   Senile purpura (HCC)    Reassurance provided today. No current concerns.      Relevant Medications   amLODipine (NORVASC) 10 MG tablet   atenolol (TENORMIN) 100 MG tablet   hydrALAZINE (APRESOLINE) 10 MG tablet   lovastatin (MEVACOR) 40 MG tablet   spironolactone (ALDACTONE) 25 MG tablet     Endocrine   DM type 2 with diabetic peripheral neuropathy (HCC)    Chronic.  Patient is  confused about medications.  She does not know what she is taking or how many units of insulin she is taking.  Daughter did not accompany patient at visit today.   Will await results and discuss medications with daughter and how many units of insulin patient should be taking.  Continue with Evaristo [place] 40u.  Labs ordered today.  Follow up in 3 months.  Call sooner if  concerns arise.       Relevant Medications   empagliflozin (JARDIANCE) 25 MG TABS tablet   lovastatin (MEVACOR) 40 MG tablet   Other Relevant Orders   HgB A1c   Hyperlipidemia associated with type 2 diabetes mellitus (HCC)    Chronic.  Controlled.  Continue with current medication regimen of Lovastatin 40mg  daily.  Labs ordered today.  Return to clinic in 3 months for reevaluation.  Call sooner if concerns arise.        Relevant Medications   amLODipine (NORVASC) 10 MG tablet   atenolol (TENORMIN) 100 MG tablet   empagliflozin (JARDIANCE) 25 MG TABS tablet   hydrALAZINE (APRESOLINE) 10 MG tablet   lovastatin (MEVACOR) 40 MG tablet   spironolactone (ALDACTONE) 25 MG tablet   Other Relevant Orders   Lipid panel     Nervous and Auditory   Mild dementia without behavioral disturbance, psychotic disturbance, mood disturbance, or anxiety (HCC)    Chronic.  Not well controlled.  Patient was not accompanied by anyone at visit today.  Patient is confused about medications.  Has not followed up with Neurology.          Genitourinary   CKD (chronic kidney disease) stage 3, GFR 30-59 ml/min (HCC)    Chronic.  Controlled.  Continue with current medication regimen.  Continue with Jardiance.  Labs ordered today.  Return to clinic in 6 months for reevaluation.  Call sooner if concerns arise.          Other   Morbid obesity (HCC)    Recommended eating smaller high protein, low fat meals more frequently and exercising 30 mins a day 5 times a week with a goal of 10-15lb weight loss in the next 3 months.       Relevant Medications   empagliflozin (JARDIANCE) 25 MG TABS tablet   Vitamin D deficiency    Labs ordered at visit today.  Will make recommendations based on lab results.        Relevant Orders   Vitamin D (25 hydroxy)   Vitamin B12 deficiency    Labs ordered at visit today.  Will make recommendations based on lab results.        Relevant Orders   B12   Other Visit  Diagnoses     Annual physical exam    -  Primary   Health maintenance reviewed during visit today.  Labs ordered.  Vaccines up to date.  Needs eye exam and lung cancer screening.   Relevant Orders   CBC with Differential/Platelet   Comprehensive metabolic panel   Lipid panel   TSH   Urinalysis, Routine w reflex microscopic   B12   Vitamin D (25 hydroxy)   HgB A1c   Pure hypercholesterolemia       Relevant Medications   amLODipine (NORVASC) 10 MG tablet   atenolol (TENORMIN) 100 MG tablet   hydrALAZINE (APRESOLINE) 10 MG tablet   lovastatin (MEVACOR) 40 MG tablet   spironolactone (ALDACTONE) 25 MG tablet        Follow up plan: Return in about 3 months (  around 08/07/2023) for HTN, HLD, DM2 FU.   LABORATORY TESTING:  - Pap smear: not applicable  IMMUNIZATIONS:   - Tdap: Tetanus vaccination status reviewed: last tetanus booster within 10 years. - Influenza: Postponed to flu season - Pneumovax: Up to date - Prevnar: Up to date - COVID: Up to date - HPV: Not applicable - Shingrix vaccine:  Discussed at visit today  SCREENING: -Mammogram: Up to date  - Colonoscopy: Up to date  - Bone Density: Up to date  -Hearing Test: Not applicable  -Spirometry: Not applicable   PATIENT COUNSELING:   Advised to take 1 mg of folate supplement per day if capable of pregnancy.   Sexuality: Discussed sexually transmitted diseases, partner selection, use of condoms, avoidance of unintended pregnancy  and contraceptive alternatives.   Advised to avoid cigarette smoking.  I discussed with the patient that most people either abstain from alcohol or drink within safe limits (<=14/week and <=4 drinks/occasion for males, <=7/weeks and <= 3 drinks/occasion for females) and that the risk for alcohol disorders and other health effects rises proportionally with the number of drinks per week and how often a drinker exceeds daily limits.  Discussed cessation/primary prevention of drug use and  availability of treatment for abuse.   Diet: Encouraged to adjust caloric intake to maintain  or achieve ideal body weight, to reduce intake of dietary saturated fat and total fat, to limit sodium intake by avoiding high sodium foods and not adding table salt, and to maintain adequate dietary potassium and calcium preferably from fresh fruits, vegetables, and low-fat dairy products.    stressed the importance of regular exercise  Injury prevention: Discussed safety belts, safety helmets, smoke detector, smoking near bedding or upholstery.   Dental health: Discussed importance of regular tooth brushing, flossing, and dental visits.    NEXT PREVENTATIVE PHYSICAL DUE IN 1 YEAR. Return in about 3 months (around 08/07/2023) for HTN, HLD, DM2 FU.

## 2023-05-07 NOTE — Assessment & Plan Note (Signed)
Chronic.  Controlled.  Continue with current medication regimen.  Continue with Jardiance.  Labs ordered today.  Return to clinic in 6 months for reevaluation.  Call sooner if concerns arise.

## 2023-05-07 NOTE — Assessment & Plan Note (Signed)
Recommended eating smaller high protein, low fat meals more frequently and exercising 30 mins a day 5 times a week with a goal of 10-15lb weight loss in the next 3 months.  

## 2023-05-07 NOTE — Assessment & Plan Note (Signed)
Chronic.  Not well controlled.  Patient was not accompanied by anyone at visit today.  Patient is confused about medications.  Has not followed up with Neurology.

## 2023-05-07 NOTE — Assessment & Plan Note (Signed)
Reassurance provided today. No current concerns. 

## 2023-05-07 NOTE — Assessment & Plan Note (Signed)
Chronic.  Controlled.  Continue with current medication regimen of Lovastatin 40mg daily.  Labs ordered today.  Return to clinic in 3 months for reevaluation.  Call sooner if concerns arise.   

## 2023-05-08 ENCOUNTER — Telehealth: Payer: Self-pay | Admitting: Nurse Practitioner

## 2023-05-08 LAB — CBC WITH DIFFERENTIAL/PLATELET
Basophils Absolute: 0.1 10*3/uL (ref 0.0–0.2)
Basos: 1 %
EOS (ABSOLUTE): 0.1 10*3/uL (ref 0.0–0.4)
Eos: 1 %
Hematocrit: 41.9 % (ref 34.0–46.6)
Hemoglobin: 13.6 g/dL (ref 11.1–15.9)
Immature Grans (Abs): 0 10*3/uL (ref 0.0–0.1)
Immature Granulocytes: 0 %
Lymphocytes Absolute: 2.1 10*3/uL (ref 0.7–3.1)
Lymphs: 23 %
MCH: 26.2 pg — ABNORMAL LOW (ref 26.6–33.0)
MCHC: 32.5 g/dL (ref 31.5–35.7)
MCV: 81 fL (ref 79–97)
Monocytes Absolute: 0.7 10*3/uL (ref 0.1–0.9)
Monocytes: 8 %
Neutrophils Absolute: 6.1 10*3/uL (ref 1.4–7.0)
Neutrophils: 67 %
Platelets: 205 10*3/uL (ref 150–450)
RBC: 5.19 x10E6/uL (ref 3.77–5.28)
RDW: 14 % (ref 11.7–15.4)
WBC: 9 10*3/uL (ref 3.4–10.8)

## 2023-05-08 LAB — URINALYSIS, ROUTINE W REFLEX MICROSCOPIC
Bilirubin, UA: NEGATIVE
Ketones, UA: NEGATIVE
Nitrite, UA: NEGATIVE
Protein,UA: NEGATIVE
Specific Gravity, UA: 1.01 (ref 1.005–1.030)
Urobilinogen, Ur: 0.2 mg/dL (ref 0.2–1.0)
pH, UA: 5 (ref 5.0–7.5)

## 2023-05-08 LAB — COMPREHENSIVE METABOLIC PANEL
ALT: 9 IU/L (ref 0–32)
AST: 9 IU/L (ref 0–40)
Albumin/Globulin Ratio: 1.8 (ref 1.2–2.2)
Albumin: 3.9 g/dL (ref 3.8–4.8)
Alkaline Phosphatase: 87 IU/L (ref 44–121)
BUN/Creatinine Ratio: 21 (ref 12–28)
BUN: 18 mg/dL (ref 8–27)
Bilirubin Total: <0.2 mg/dL (ref 0.0–1.2)
CO2: 20 mmol/L (ref 20–29)
Calcium: 8.9 mg/dL (ref 8.7–10.3)
Chloride: 101 mmol/L (ref 96–106)
Creatinine, Ser: 0.85 mg/dL (ref 0.57–1.00)
Globulin, Total: 2.2 g/dL (ref 1.5–4.5)
Glucose: 173 mg/dL — ABNORMAL HIGH (ref 70–99)
Potassium: 4.1 mmol/L (ref 3.5–5.2)
Sodium: 138 mmol/L (ref 134–144)
Total Protein: 6.1 g/dL (ref 6.0–8.5)
eGFR: 71 mL/min/{1.73_m2} (ref >59)

## 2023-05-08 LAB — HEMOGLOBIN A1C
Est. average glucose Bld gHb Est-mCnc: 272 mg/dL
Hgb A1c MFr Bld: 11.1 % — ABNORMAL HIGH (ref 4.8–5.6)

## 2023-05-08 LAB — LIPID PANEL
Chol/HDL Ratio: 3.3 ratio (ref 0.0–4.4)
Cholesterol, Total: 132 mg/dL (ref 100–199)
HDL: 40 mg/dL (ref >39)
LDL Chol Calc (NIH): 61 mg/dL (ref 0–99)
Triglycerides: 184 mg/dL — ABNORMAL HIGH (ref 0–149)
VLDL Cholesterol Cal: 31 mg/dL (ref 5–40)

## 2023-05-08 LAB — VITAMIN B12: Vitamin B-12: 252 pg/mL (ref 232–1245)

## 2023-05-08 LAB — TSH: TSH: 1.44 u[IU]/mL (ref 0.450–4.500)

## 2023-05-08 LAB — MICROSCOPIC EXAMINATION: Bacteria, UA: NONE SEEN

## 2023-05-08 LAB — VITAMIN D 25 HYDROXY (VIT D DEFICIENCY, FRACTURES): Vit D, 25-Hydroxy: 39.6 ng/mL (ref 30.0–100.0)

## 2023-05-08 NOTE — Progress Notes (Signed)
Please let patient's daughter know that her A1c is still 11.1%.  She did not know any of her medications during our visit today or how much insulin she is giving herself.  I am concerned about her compliance with the medications.  I have put in a referral to home health to have a nurse come out and help her with the medications.  If her daughter is okay with it, I would like to request an AID come out to her home to help with medication compliance as well.  These would be covered by her insurance.  I also encourage her to attend patient's appointments.

## 2023-05-08 NOTE — Telephone Encounter (Unsigned)
Copied from CRM [id number]. Topic: General - Other >> May 08, 2023 11:15 AM Alfred Levins wrote: Reason for CRM: Loraine Leriche w/[facility]  ph# [phone]... please call regarding documents that were faxed over on the 17th,  He will also refax the documents again today

## 2023-05-08 NOTE — Addendum Note (Signed)
Addended by: Larae Grooms on: 05/08/2023 12:50 PM   Modules accepted: Orders

## 2023-05-11 ENCOUNTER — Telehealth: Payer: Self-pay | Admitting: Nurse Practitioner

## 2023-05-11 DIAGNOSIS — E7849 Other hyperlipidemia: Secondary | ICD-10-CM | POA: Diagnosis not present

## 2023-05-11 DIAGNOSIS — F02A0 Dementia in other diseases classified elsewhere, mild, without behavioral disturbance, psychotic disturbance, mood disturbance, and anxiety: Secondary | ICD-10-CM | POA: Diagnosis not present

## 2023-05-11 DIAGNOSIS — E1169 Type 2 diabetes mellitus with other specified complication: Secondary | ICD-10-CM | POA: Diagnosis not present

## 2023-05-11 DIAGNOSIS — E1142 Type 2 diabetes mellitus with diabetic polyneuropathy: Secondary | ICD-10-CM

## 2023-05-11 DIAGNOSIS — G309 Alzheimer's disease, unspecified: Secondary | ICD-10-CM | POA: Diagnosis not present

## 2023-05-11 DIAGNOSIS — N183 Chronic kidney disease, stage 3 unspecified: Secondary | ICD-10-CM | POA: Diagnosis not present

## 2023-05-11 DIAGNOSIS — E1122 Type 2 diabetes mellitus with diabetic chronic kidney disease: Secondary | ICD-10-CM | POA: Diagnosis not present

## 2023-05-11 DIAGNOSIS — I152 Hypertension secondary to endocrine disorders: Secondary | ICD-10-CM | POA: Diagnosis not present

## 2023-05-11 DIAGNOSIS — E1159 Type 2 diabetes mellitus with other circulatory complications: Secondary | ICD-10-CM | POA: Diagnosis not present

## 2023-05-11 NOTE — Telephone Encounter (Signed)
Called and let Erie Noe know Tami Cummings's response. Let her know that we would be sending in a prescription for the diabetic supplies.   RX t'd up to be sent in.

## 2023-05-11 NOTE — Telephone Encounter (Signed)
Tami Cummings requesting guidance: Ozempic- not been taking- do you want her to start? ( she does not have in the home)  Insulin - she is taking 25 units [place]- do you wants her to increase to 40?   Patient does not have testing supplies in the home- needs supplies and meter     Tami Cummings is requesting a call back at [phone].

## 2023-05-11 NOTE — Telephone Encounter (Signed)
Tami Cummings from [facility] stated patient has only been taking 25 units of insulin degludec (TRESIBA FLEXTOUCH) 100 UNIT/ML FlexTouch Pen instead of the 40 units. She also has not been taking he Ozempic and does not remember how long all if this has been taking place. Tami Cummings is requesting a call back at [phone].

## 2023-05-11 NOTE — Telephone Encounter (Signed)
Yes I want her to take Ozempic.  Yes we can increase to 40u.  Please pend diabetic testing supplies to send in.

## 2023-05-15 DIAGNOSIS — E1169 Type 2 diabetes mellitus with other specified complication: Secondary | ICD-10-CM | POA: Diagnosis not present

## 2023-05-15 DIAGNOSIS — N183 Chronic kidney disease, stage 3 unspecified: Secondary | ICD-10-CM | POA: Diagnosis not present

## 2023-05-15 DIAGNOSIS — F02A0 Dementia in other diseases classified elsewhere, mild, without behavioral disturbance, psychotic disturbance, mood disturbance, and anxiety: Secondary | ICD-10-CM | POA: Diagnosis not present

## 2023-05-15 DIAGNOSIS — G309 Alzheimer's disease, unspecified: Secondary | ICD-10-CM | POA: Diagnosis not present

## 2023-05-15 DIAGNOSIS — E1122 Type 2 diabetes mellitus with diabetic chronic kidney disease: Secondary | ICD-10-CM | POA: Diagnosis not present

## 2023-05-15 DIAGNOSIS — E1142 Type 2 diabetes mellitus with diabetic polyneuropathy: Secondary | ICD-10-CM | POA: Diagnosis not present

## 2023-05-15 DIAGNOSIS — I152 Hypertension secondary to endocrine disorders: Secondary | ICD-10-CM | POA: Diagnosis not present

## 2023-05-15 DIAGNOSIS — E7849 Other hyperlipidemia: Secondary | ICD-10-CM | POA: Diagnosis not present

## 2023-05-15 DIAGNOSIS — E1159 Type 2 diabetes mellitus with other circulatory complications: Secondary | ICD-10-CM | POA: Diagnosis not present

## 2023-05-15 MED ORDER — ONETOUCH ULTRA 2 W/DEVICE KIT
1.0000 | PACK | Freq: Two times a day (BID) | 0 refills | Status: AC
Start: 2023-05-15 — End: ?

## 2023-05-15 MED ORDER — ONETOUCH ULTRA VI STRP
1.0000 | ORAL_STRIP | Freq: Two times a day (BID) | 3 refills | Status: AC
Start: 2023-05-15 — End: ?

## 2023-05-15 MED ORDER — ONETOUCH ULTRASOFT LANCETS MISC
1.0000 | Freq: Two times a day (BID) | 3 refills | Status: AC
Start: 2023-05-15 — End: ?

## 2023-05-15 NOTE — Telephone Encounter (Signed)
Prescription signed.

## 2023-05-17 DIAGNOSIS — G309 Alzheimer's disease, unspecified: Secondary | ICD-10-CM | POA: Diagnosis not present

## 2023-05-17 DIAGNOSIS — E7849 Other hyperlipidemia: Secondary | ICD-10-CM | POA: Diagnosis not present

## 2023-05-17 DIAGNOSIS — E1159 Type 2 diabetes mellitus with other circulatory complications: Secondary | ICD-10-CM | POA: Diagnosis not present

## 2023-05-17 DIAGNOSIS — E1169 Type 2 diabetes mellitus with other specified complication: Secondary | ICD-10-CM | POA: Diagnosis not present

## 2023-05-17 DIAGNOSIS — F02A0 Dementia in other diseases classified elsewhere, mild, without behavioral disturbance, psychotic disturbance, mood disturbance, and anxiety: Secondary | ICD-10-CM | POA: Diagnosis not present

## 2023-05-17 DIAGNOSIS — E1122 Type 2 diabetes mellitus with diabetic chronic kidney disease: Secondary | ICD-10-CM | POA: Diagnosis not present

## 2023-05-17 DIAGNOSIS — I152 Hypertension secondary to endocrine disorders: Secondary | ICD-10-CM | POA: Diagnosis not present

## 2023-05-17 DIAGNOSIS — E1142 Type 2 diabetes mellitus with diabetic polyneuropathy: Secondary | ICD-10-CM | POA: Diagnosis not present

## 2023-05-17 DIAGNOSIS — N183 Chronic kidney disease, stage 3 unspecified: Secondary | ICD-10-CM | POA: Diagnosis not present

## 2023-05-24 ENCOUNTER — Telehealth: Payer: Self-pay

## 2023-05-24 NOTE — Telephone Encounter (Signed)
Pt is calling back stating that her medication was sent to the wrong pharmacy. Pt does not remember the name of the medication and states that she think it is for her diabetes and she think it is Ozempic.  Pt states that it is a new medicine and that her PCP knows what the medication is.   Please send the prescription to: [id number] - [place], [place] - [address]  Phone: [phone] Fax: [phone]    Pt would like for her PCP to call her back  [phone].

## 2023-05-24 NOTE — Progress Notes (Unsigned)
Care Management & Coordination Services Pharmacy Team  Reason for Encounter: Diabetes  Contacted patient to discuss diabetes disease state. {US HC Outreach:28874}  Current antihyperglycemic regimen:  ***   Patient verbally confirms she is taking the above medications as directed. {yes/no:20286}  What diet changes have been made to improve diabetes control?  What recent interventions/DTPs have been made to improve glycemic control:  Continue with Tresiba 40 u  Have there been any recent hospitalizations or ED visits since last visit with PharmD? No  Patient {reports/denies:24182} hypoglycemic symptoms, including {Hypoglycemic Symptoms:3049003}  Patient {reports/denies:24182} hyperglycemic symptoms, including {symptoms; hyperglycemia:17903}  How often are you checking your blood sugar? {BG Testing frequency:23922}  What are your blood sugars ranging?  Fasting: *** Before meals: *** After meals: *** Bedtime: ***  During the week, how often does your blood glucose drop below 70? {LowBGfrequency:24142}  Are you checking your feet daily/regularly? {yes/no:20286}  Adherence Review: Is the patient currently on a STATIN medication? Yes Is the patient currently on ACE/ARB medication? No Does the patient have >5 day gap between last estimated fill dates? No   Chart Updates:  Recent office visits:  05/07/23 Larae Grooms, NP (Annual exam) Orders placed: Labs and referral to Home Health. Medication changes: none  03/14/23 Larae Grooms, NP (HTN) Orders placed: Labs. Medication changes: none  01/23/23 Larae Grooms, NP (Diabetes) Orders placed: Referral to Neurology. Medication changes: none  12/07/22 Larae Grooms, NP (HTN) Orders placed: Labs. Medication changes: Semaglutide 0.25 mg  Recent consult visits:  05/22/23 Theora Master, MD Neurology [facility] (Memory Loss) No orders or medication changes:Call in medication for pill packs today to help manage  medication. At next visit with PCP inquire the physician to take over management of pill packs.  - Continue Aricept 5 mg at night to slow progression of memory, may increase at next visit.  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  Hospital visits:  None in previous 6 months  Medications: Outpatient Encounter Medications as of 05/24/2023  Medication Sig   amLODipine (NORVASC) 10 MG tablet Take 1 tablet (10 mg total) by mouth daily.   atenolol (TENORMIN) 100 MG tablet Take 1 tablet (100 mg total) by mouth daily.   BD PEN NEEDLE MICRO U/F 32G X 6 MM MISC USE 4 TO 5 TIMES DAILY WITH INSULIN USE   Blood Glucose Monitoring Suppl (ONE TOUCH ULTRA 2) w/Device KIT 1 each by Does not apply route 2 (two) times daily.   Blood Glucose Monitoring Suppl (ONETOUCH VERIO) w/Device KIT Use to check blood sugar 2-3 times daily and document, bring results to provider visits.   donepezil (ARICEPT) 5 MG tablet Take 5 mg by mouth at bedtime.   empagliflozin (JARDIANCE) 25 MG TABS tablet Take 1 tablet (25 mg total) by mouth daily before breakfast.   glucose blood (ONETOUCH ULTRA) test strip 1 each by Other route 2 (two) times daily. Use as instructed   glucose blood test strip Use to check blood sugar 2-3 times daily and document, bring results to provider visits.   hydrALAZINE (APRESOLINE) 10 MG tablet Take 1 tablet (10 mg total) by mouth 2 (two) times daily.   insulin degludec (TRESIBA FLEXTOUCH) 100 UNIT/ML FlexTouch Pen INJECT 40 UNITS SUBCUTANEOUSLY ONCE DAILY   Lancets (ONETOUCH ULTRASOFT) lancets Use to check blood sugar 2-3 times daily and document, bring results to provider visits.   Lancets (ONETOUCH ULTRASOFT) lancets 1 each  by Other route 2 (two) times daily. Use as instructed   lovastatin (MEVACOR) 40 MG tablet Take 1 tablet (40 mg total) by mouth daily with breakfast.   OneTouch  Delica Lancets 33G MISC 1 applicator by Does not apply route in the morning and at bedtime.   Semaglutide,0.25 or 0.5MG /DOS, (OZEMPIC, 0.25 OR 0.5 MG/DOSE,) 2 MG/3ML SOPN Inject 0.25 mg into the skin once a week.   spironolactone (ALDACTONE) 25 MG tablet Take 1 tablet (25 mg total) by mouth daily.   VITAMIN D, CHOLECALCIFEROL, PO Take 5,000 Units by mouth daily.   No facility-administered encounter medications on file as of 05/24/2023.    Recent Relevant Labs: Lab Results  Component Value Date/Time   HGBA1C 11.1 (H) 05/07/2023 01:50 PM   HGBA1C 11.1 (H) 03/14/2023 03:46 PM   HGBA1C 9.0 (H) 08/15/2022 04:25 PM   HGBA1C 9.4 (H) 01/05/2021 11:12 AM   HGBA1C 7.9 07/13/2016 12:00 AM   MICROALBUR 30 (H) 08/15/2022 04:25 PM   MICROALBUR 30 (H) 01/05/2021 11:12 AM    Kidney Function Lab Results  Component Value Date/Time   CREATININE 0.85 05/07/2023 01:50 PM   CREATININE 0.85 03/14/2023 03:46 PM   GFRNONAA 63 01/05/2021 11:14 AM   GFRAA 72 01/05/2021 11:14 AM    Star Rating Drugs:  Medication:  Last Fill: [facility] Lovastatin 40 mg 05/07/23, 12/27/22 90   Care Gaps: Annual wellness visit in last year? Yes, 05/07/23 Last eye exam / retinopathy screening:None recent Last diabetic foot exam:05/07/23   Velvet Bathe

## 2023-05-28 MED ORDER — OZEMPIC (0.25 OR 0.5 MG/DOSE) 2 MG/3ML ~~LOC~~ SOPN: 0.2500 mg | PEN_INJECTOR | SUBCUTANEOUS | 0 refills | Status: DC

## 2023-05-28 NOTE — Addendum Note (Signed)
Addended by: Pablo Ledger on: 05/28/2023 01:42 PM   Modules accepted: Orders

## 2023-05-28 NOTE — Telephone Encounter (Signed)
Medication sent to [facility].

## 2023-05-28 NOTE — Addendum Note (Signed)
Addended by: Larae Grooms on: 05/28/2023 01:43 PM   Modules accepted: Orders

## 2023-05-28 NOTE — Telephone Encounter (Signed)
Can RX for Ozempic be sent in to [facility] in [place] for the patient?

## 2023-05-29 ENCOUNTER — Telehealth: Payer: Self-pay | Admitting: Nurse Practitioner

## 2023-05-29 NOTE — Telephone Encounter (Unsigned)
Copied from CRM [id number]. Topic: General - Inquiry >> May 29, 2023  1:07 PM De Blanch wrote: Reason for CRM: Jari Favre from [facility] called to f/u on forms faxed on 06/10.  Please advise.

## 2023-06-07 ENCOUNTER — Telehealth: Payer: Self-pay

## 2023-06-07 NOTE — Telephone Encounter (Signed)
Called patient to schedule appt. for diabetic shoes, she stated that she will not have transportation for 2 weeks and will call the office when her daughter comes back from vacation. CRM was made for PEC.

## 2023-06-07 NOTE — Telephone Encounter (Signed)
Pt will need appointment for diabetic shoes forms to be completed by Dr.Johnson please schedule forms will be in bin

## 2023-06-11 NOTE — Telephone Encounter (Signed)
Mark with [facility] lab called in regards to paperwork faxed over on 6/18 for patient. Loraine Leriche stated he called to confirm the paperwork was received and stated he spoke with someone on 6/20 saying the paperwork was received, for [employer] testing, that patient is requesting this. Loraine Leriche stated this has not been resolved since he first called back on 05/08/2023. Please document regarding the status and also Loraine Leriche was requesting a call back regarding the status of this paperwork.   Marks callback #: [phone]  [place] fax #: [phone]

## 2023-06-12 ENCOUNTER — Telehealth: Payer: Self-pay | Admitting: Nurse Practitioner

## 2023-06-12 NOTE — Telephone Encounter (Signed)
Copied from CRM [id number]. Topic: General - Inquiry >> Jun 12, 2023 12:04 PM Patsy Lager T wrote: Reason for CRM: patient daughter said she was returning Brittanys call but I didn't see notes to show reason for call. Please f/u with daughter

## 2023-06-12 NOTE — Telephone Encounter (Signed)
Called the patient's daughter to see if this is something that the patient has requested. Form will not be signed unless it was requested by the patient to have this testing done per Clydie Braun. LVM asking for patients daughter to please return my call.

## 2023-06-20 NOTE — Telephone Encounter (Unsigned)
Copied from CRM [id number]. Topic: General - Inquiry >> Jun 20, 2023 11:44 AM De Blanch wrote: Reason for EAV:[employer] calling to f/u on fax that has been sent to office multiple times for supplies and medication refills. Re-sending fax today.   Please advise.

## 2023-06-27 ENCOUNTER — Telehealth: Payer: Self-pay | Admitting: Nurse Practitioner

## 2023-06-27 NOTE — Telephone Encounter (Signed)
Called patient  3X unable to reach with number listed.

## 2023-06-27 NOTE — Telephone Encounter (Signed)
Appointment has been made

## 2023-06-27 NOTE — Telephone Encounter (Unsigned)
Copied from CRM [id number]. Topic: General - Other >> Jun 27, 2023 11:47 AM Epimenio Foot F wrote: Reason for CRM: Elle with [employer] is calling because she faxed over a prescription form for pt regarding diabetic shoes as well as other diabetic supplies and says they have not received anything back. Elle says she will be resending the form.

## 2023-06-29 ENCOUNTER — Ambulatory Visit (INDEPENDENT_AMBULATORY_CARE_PROVIDER_SITE_OTHER): Payer: 59 | Admitting: Physician Assistant

## 2023-06-29 ENCOUNTER — Encounter: Payer: Self-pay | Admitting: Physician Assistant

## 2023-06-29 VITALS — BP 113/65 | HR 92 | Temp 97.9°F | Ht 61.0 in | Wt 174.8 lb

## 2023-06-29 DIAGNOSIS — Z91199 Patient's noncompliance with other medical treatment and regimen due to unspecified reason: Secondary | ICD-10-CM | POA: Diagnosis not present

## 2023-06-29 DIAGNOSIS — E785 Hyperlipidemia, unspecified: Secondary | ICD-10-CM

## 2023-06-29 DIAGNOSIS — I152 Hypertension secondary to endocrine disorders: Secondary | ICD-10-CM

## 2023-06-29 DIAGNOSIS — E1142 Type 2 diabetes mellitus with diabetic polyneuropathy: Secondary | ICD-10-CM | POA: Diagnosis not present

## 2023-06-29 DIAGNOSIS — E1159 Type 2 diabetes mellitus with other circulatory complications: Secondary | ICD-10-CM | POA: Diagnosis not present

## 2023-06-29 DIAGNOSIS — F03A0 Unspecified dementia, mild, without behavioral disturbance, psychotic disturbance, mood disturbance, and anxiety: Secondary | ICD-10-CM

## 2023-06-29 DIAGNOSIS — E1169 Type 2 diabetes mellitus with other specified complication: Secondary | ICD-10-CM

## 2023-06-29 DIAGNOSIS — F4322 Adjustment disorder with anxiety: Secondary | ICD-10-CM | POA: Insufficient documentation

## 2023-06-29 MED ORDER — SERTRALINE HCL 25 MG PO TABS
25.0000 mg | ORAL_TABLET | Freq: Every day | ORAL | 1 refills | Status: AC
Start: 2023-06-29 — End: ?

## 2023-06-29 NOTE — Patient Instructions (Addendum)
  Please schedule a diabetic eye exam to screen for diabetic retinopathy   Please follow up in the office after 08/07/23 so we can recheck your A1c

## 2023-06-29 NOTE — Assessment & Plan Note (Signed)
Chronic, historic condition Appears controlled on current regimen comprised of amlodipine 10 mg p.o. daily, hydralazine 10 mg p.o. twice daily, spironolactone 25 mg p.o. daily, atenolol 100 mg daily Patient reports that she is taking her medications every day in the morning and her BP appears well-controlled today I examined her current bottles of medications which appear to have appropriate amount of medication left compared to time of fill date Continue current regimen Patient's daughter reports that she is going to try to send her medications to [facility] in the future so that pill packs can be created to improve compliance-agree with this measure and will facilitate once this is ironed out Follow-up in 3 months or sooner if concerns arise

## 2023-06-29 NOTE — Assessment & Plan Note (Signed)
Chronic, historic condition She reports that she is taking lovastatin 40 mg p.o. daily Reviewed most recent lab results-cholesterol appears pretty well-controlled Continue current regimen Follow-up in 3 months or sooner if concerns arise

## 2023-06-29 NOTE — Assessment & Plan Note (Signed)
Suspect that this is ongoing but unsure of chronicity Patient's daughter states that patient has had increased mood symptoms, anxiety specifically Patient's daughter reports that this was recently exacerbated when she went on vacation for 2 weeks and patient was alone I attempted to provide education regarding dementia and importance of routine and structure to prevent panic attacks and anxiety and agitation Will start trial of sertraline 25 mg p.o. daily with increase to 50 in about 2 weeks to prevent side effects Follow-up in 6 weeks to assess response
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
## 2023-06-29 NOTE — Assessment & Plan Note (Signed)
Chronic, historic condition Reviewed most recent notes from neurology appointment on 05/22/2023 which shows mild to moderate dementia with some agitation She is currently prescribed Aricept 5 mg p.o. nightly but based on patient conversation I suspect she is taking it in the morning I agree with recommendations from neurology to initiate pill packs to improve compliance with medication use Patient's daughter is aware of this effort and we will start sending medications to pharmacy capable of doing this once daughter is okay with that Recommend continued follow-up with neurology and will continue to monitor here to further collaborate on care and management plan

## 2023-06-29 NOTE — Assessment & Plan Note (Signed)
Chronic, ongoing After discussion with patient and patient's daughter today I suspect that her compliance has mildly improved at least with oral medications but I am unsure regarding her use of the injectables I agree with neurology's recommendation to start using pill packs to improve consistency and will move ahead when patient's family can get everything sorted out

## 2023-06-29 NOTE — Assessment & Plan Note (Signed)
Chronic, historic condition Patient reports improved medication compliance than previous She states that she is taking her oral medications as directed and is using her Evaristo Bury daily but she is not sure how much of her Evaristo Bury she is taking Reviewed that she should be taking Jardiance 25 mg p.o. daily, Tresiba 40 units daily, Ozempic 0.25 mg weekly injection Follow-up is too early today to recheck A1c so recommend rechecking in about 6 weeks to assess compliance Call sooner if concerns arise

## 2023-07-02 ENCOUNTER — Telehealth: Payer: Self-pay | Admitting: Nurse Practitioner

## 2023-07-02 NOTE — Telephone Encounter (Signed)
Paperwork completed per request. Patient will need apt for physician to complete diabetic shoe forms.

## 2023-07-02 NOTE — Telephone Encounter (Signed)
Patient is scheduled with Dr Laural Benes on 07/26/23 for Diabetic Shoes.

## 2023-07-02 NOTE — Telephone Encounter (Addendum)
Jari Favre Emergency planning/management officer) with [employer] has called in regards to stating they faxed over a written order prescription for patient for [employer]. Per Jari Favre, this was faxed to [phone] on Friday (7.12.24) & again this morning (7.15.24). It was 2 pages including the cover sheet. Jari Favre stated he will fax it again as well.  Jari Favre needs this by the end of the day, today is the deadline*  Per Jari Favre, this signed prescription needs to be signed & he needs the last office visit notes as well. This can be faxed back to [facility] @ [phone].

## 2023-07-05 NOTE — Telephone Encounter (Signed)
Copied from CRM [id number]. Topic: General - Other >> Jul 05, 2023 11:48 AM Lennox Pippins wrote: Steva Ready with [facility] has called in regards to an urgent request for labs for patient that was faxed over today on 7.18.24 to fax number [phone] and per Dahlia Client this needs to be signed and faxed back over to [phone].  Hannah's callback # [phone]

## 2023-07-05 NOTE — Telephone Encounter (Signed)
Paperwork in folder to be signed.

## 2023-07-09 NOTE — Telephone Encounter (Signed)
Jari Favre from [employer] phone # [phone] fax # [phone] calling in checking on the status of forms mentioned below

## 2023-07-09 NOTE — Telephone Encounter (Signed)
Yes, we will not be signing these orders as they do not appear to have originated from [place].

## 2023-07-09 NOTE — Telephone Encounter (Signed)
Spoke with a representative from [employer] and informed representative, that provider is not signing any requested orders, as the patient was just seen in the office and she did not mention it to the provider. Representative says the patient called in and requested it. Representative was informed patient would need to come into the office to discuss the medication and supplies. Representative verbalized understanding.

## 2023-07-17 ENCOUNTER — Telehealth: Payer: Self-pay | Admitting: Nurse Practitioner

## 2023-07-17 ENCOUNTER — Ambulatory Visit: Payer: Self-pay | Admitting: *Deleted

## 2023-07-17 NOTE — Telephone Encounter (Signed)
-----   Message from [place] sent at 07/17/2023  3:22 PM EDT -----  Per agent: "Pt 's [name] called saying her mother fell two Saturdays ago and last Saturday she took her to the [facility].  She has an appt Friday at [facility].  Pt is asking for something for pain."   Please advise  [phone]   Chief Complaint: Back pain, Able to reach pt. Symptoms: MiId to lower back pain x 2 weeks S/P fall. Went to ED 07/14/23. "Muscular" Advised IBU, OTC analgesics, Bengay.  States "Tried them all, 10/10 pain, no help." States heating pad helps relieve pain "A little."  Frequency: 2 weeks Pertinent Negatives: Patient denies ..pain does not radiate,no bowel, bladder issues, no weakness. Disposition: [] ED /[] Urgent Care (no appt availability in office) / [] Appointment(In office/virtual)/ []  [facility]/ [] Home Care/ [] Refused Recommended Disposition /[] [place] [facility]/ [x]  Follow-up with PCP Additional Notes: Daughter initially called requesting other pain med. Pt has appt Friday, first date daughter could bring pt. due to her schedule. Spoke to pt for above assessment.  Daughter questioning if she should take pt to [place] ED, "[facility] did nothing for her."  Assured NT would route to practice for PCPs review and final disposition. Pt states she will not  go to ED again. Please advise.  Reason for Disposition  [1] SEVERE back pain (e.g., excruciating, unable to do any normal activities) AND [2] not improved 2 hours after pain medicine  Answer Assessment - Initial Assessment Questions 1. ONSET: "When did the pain begin?"      2 weeks ago, S/P fall, tripped 2. LOCATION: "Where does it hurt?" (upper, mid or lower back)     Right in middle of back 3. SEVERITY: "How bad is the pain?"  (e.g., Scale 1-10; mild, moderate, or severe)   - MILD (1-3): Doesn't interfere with normal activities.    - MODERATE (4-7): Interferes with normal activities or awakens from sleep.    - SEVERE (8-10): Excruciating  pain, unable to do any normal activities.      10/10 4. PATTERN: "Is the pain constant?" (e.g., yes, no; constant, intermittent)      Constant 5. RADIATION: "Does the pain shoot into your legs or somewhere else?"     no 6. CAUSE:  "What do you think is causing the back pain?"      Fall 7. BACK OVERUSE:  "Any recent lifting of heavy objects, strenuous work or exercise?"     Fall 8. MEDICINES: "What have you taken so far for the pain?" (e.g., nothing, acetaminophen, NSAIDS)     A lot OTC, no relief. Heat helps a little 9. NEUROLOGIC SYMPTOMS: "Do you have any weakness, numbness, or problems with bowel/bladder control?"     No 10. OTHER SYMPTOMS: "Do you have any other symptoms?" (e.g., fever, abdomen pain, burning with urination, blood in urine)       No  Protocols used: Back Pain-A-AH

## 2023-07-17 NOTE — Telephone Encounter (Signed)
error 

## 2023-07-17 NOTE — Telephone Encounter (Unsigned)
Copied from CRM [id number]. Topic: General - Inquiry >> Jul 17, 2023 10:21 AM De Blanch wrote: Reason for UYQ:[id number] from [facility] stated Clydie Braun is PCP, but orders are signed by Denny Peon. Advised Tiffany that Clydie Braun is out on maternity leave. She stated she is going to update the orders with Mecum, Erin's name and refax to the office.  Please advise.

## 2023-07-19 NOTE — Telephone Encounter (Signed)
Left message for [facility] staff in regards to previous telephone encounter. Called to inform them patient's PCP is out of the office on maternity leave and Jacquelin Hawking, PA is seeing her patients. Also, to make them aware of Erin's recommendations.   OK for [facility] to give note if patient calls back.

## 2023-07-20 ENCOUNTER — Encounter: Payer: Self-pay | Admitting: Family Medicine

## 2023-07-20 ENCOUNTER — Ambulatory Visit (INDEPENDENT_AMBULATORY_CARE_PROVIDER_SITE_OTHER): Payer: 59 | Admitting: Family Medicine

## 2023-07-20 ENCOUNTER — Ambulatory Visit
Admission: RE | Admit: 2023-07-20 | Discharge: 2023-07-20 | Disposition: A | Discharge status: Home / Self Care | Payer: 59 | Source: Ambulatory Visit | Attending: Family Medicine | Admitting: Family Medicine

## 2023-07-20 ENCOUNTER — Ambulatory Visit
Admission: RE | Admit: 2023-07-20 | Discharge: 2023-07-20 | Disposition: A | Discharge status: Home / Self Care | Payer: 59 | Attending: Family Medicine | Admitting: Family Medicine

## 2023-07-20 ENCOUNTER — Telehealth: Payer: Self-pay | Admitting: Nurse Practitioner

## 2023-07-20 VITALS — BP 122/71 | HR 76 | Wt 174.0 lb

## 2023-07-20 DIAGNOSIS — M546 Pain in thoracic spine: Secondary | ICD-10-CM | POA: Insufficient documentation

## 2023-07-20 MED ORDER — KETOROLAC TROMETHAMINE 60 MG/2ML IM SOLN
30.0000 mg | Freq: Once | INTRAMUSCULAR | Status: AC
Start: 2023-07-20 — End: 2023-07-20
  Administered 2023-07-20: 30 mg via INTRAMUSCULAR

## 2023-07-20 NOTE — Telephone Encounter (Signed)
Copied from CRM [id number]. Topic: General - Inquiry >> Jul 20, 2023  2:49 PM Lennox Pippins wrote: Patients daughter called and stated her mother had a missed call from someone, no documentation provided about this. Please advise. Patients callback # [phone]

## 2023-07-20 NOTE — Progress Notes (Signed)
Hi Tami Cummings can you let Tami Cummings know her spinal xray came back showing some bone weakness putting her at increased risk for fractures in that area, there were no fractures seen on her imaging. We would like for her to obtain more imaging that will be able to look further into detecting a fracture if there is one, a spinal CT. I have placed the order for her, our referral coordinator is who called earlier trying to get her scheduled for this. I have also put in an order for a chest xray for her to obtain at [facility], she can walk in for this. Thank you for allowing me to participate in your care.

## 2023-07-20 NOTE — Assessment & Plan Note (Signed)
Acute, unstable. Occurred after fall. Spinal xray ordered STAT to rule out fracture.  Torodol 30 mg given today for pain relief. Recommend continue Ibuprofen 600 mg every 6 hours, alternate with Tylenol 1000 mg, salonpas patch, and heat.

## 2023-07-20 NOTE — Progress Notes (Signed)
BP 122/71   Pulse 76   Wt 174 lb (78.9 kg)   SpO2 97%   BMI 32.88 kg/m    Subjective:    [name], female    DOB: [birth year], 77 y.o.   MRN: [id number]  HPI: Tami Cummings is a 77 y.o. female  Chief Complaint  Patient presents with   Back Pain   Fall    Patient daughter says patient fell flat on her back a week before she went to the ER. Patient daughter says she took her due to patient complaining of pain. Patient has been taking Aleve Back and Muscle and says it does not help with.    She is here today for ED follow up on 07/14/2023 for back pain. 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 Checked mostly kidneys. Future:    BACK PAIN She is taking Ibuprofen every 6 hours and Tylenol every 6 hours.  Duration:2 weeks Mechanism of injury:  Fall Location: midline and upper back Onset: sudden Severity: 10/10 Quality: sharp and aching Frequency: constant Radiation: none Aggravating factors: none Alleviating factors: heat Status: worse Treatments attempted: heat and ibuprofen  Relief with NSAIDs?: no Nighttime pain:  yes Paresthesias / decreased sensation:  no Bowel / bladder incontinence:  no Fevers:  no Dysuria / urinary frequency:  no   Relevant past medical, surgical, family and social history reviewed and updated as indicated. Interim medical history since our last visit reviewed. Allergies and medications reviewed and updated.  Review of Systems  Respiratory: Negative.    Cardiovascular: Negative.   Musculoskeletal:  Positive for back pain and gait problem (wheelchair). Negative for arthralgias, joint swelling and myalgias.    Per HPI unless specifically indicated above     Objective:    BP 122/71   Pulse 76    Wt 174 lb (78.9 kg)   SpO2 97%   BMI 32.88 kg/m   Wt Readings from Last 3 Encounters:  07/20/23 174 lb (78.9 kg)  06/29/23 174 lb 12.8 oz (79.3 kg)  05/07/23 179 lb (81.2 kg)    Physical Exam Vitals and nursing note reviewed.  Constitutional:      General: She is awake. She is not in acute distress.    Appearance: Normal appearance. She is well-developed and well-groomed. She is obese. She is not ill-appearing.  HENT:     Head: Normocephalic and atraumatic.     Right Ear: Hearing and external ear normal. No drainage.     Left Ear: Hearing and external ear normal. No drainage.     Nose: Nose normal.  Eyes:     General: Lids are normal.        Right eye: No discharge.        Left eye: No discharge.     Conjunctiva/sclera: Conjunctivae normal.  Cardiovascular:     Rate and Rhythm: Normal rate and regular rhythm.     Pulses:          Radial pulses are 2+ on the right side and 2+ on the left side.       Posterior tibial pulses are 2+ on the right side and 2+ on the left side.     Heart sounds: Normal heart sounds, S1 normal and S2 normal. No murmur heard.  No gallop.  Pulmonary:     Effort: Pulmonary effort is normal. No accessory muscle usage or respiratory distress.     Breath sounds: Decreased breath sounds present. No wheezing, rhonchi or rales.  Musculoskeletal:        General: Normal range of motion.     Cervical back: Full passive range of motion without pain and normal range of motion.     Right lower leg: No edema.     Left lower leg: No edema.  Skin:    General: Skin is warm and dry.     Capillary Refill: Capillary refill takes less than 2 seconds.  Neurological:     Mental Status: She is alert and oriented to person, place, and time.  Psychiatric:        Attention and Perception: Attention normal.        Mood and Affect: Mood normal.        Speech: Speech normal.        Behavior: Behavior normal. Behavior is cooperative.        Thought Content: Thought content  normal.     Results for orders placed or performed in visit on 05/07/23  Microscopic Examination   Urine  Result Value Ref Range   WBC, UA 6-10 (A) 0 - 5 /hpf   RBC, Urine 0-2 0 - 2 /hpf   Epithelial Cells (non renal) 0-10 0 - 10 /hpf   Bacteria, UA None seen None seen/Few   Yeast, UA Present (A) None seen  CBC with Differential/Platelet  Result Value Ref Range   WBC 9.0 3.4 - 10.8 x10E3/uL   RBC 5.19 3.77 - 5.28 x10E6/uL   Hemoglobin 13.6 11.1 - 15.9 g/dL   Hematocrit 19.1 47.8 - 46.6 %   MCV 81 79 - 97 fL   MCH 26.2 (L) 26.6 - 33.0 pg   MCHC 32.5 31.5 - 35.7 g/dL   RDW 29.5 62.1 - 30.8 %   Platelets 205 150 - 450 x10E3/uL   Neutrophils 67 Not Estab. %   Lymphs 23 Not Estab. %   Monocytes 8 Not Estab. %   Eos 1 Not Estab. %   Basos 1 Not Estab. %   Neutrophils Absolute 6.1 1.4 - 7.0 x10E3/uL   Lymphocytes Absolute 2.1 0.7 - 3.1 x10E3/uL   Monocytes Absolute 0.7 0.1 - 0.9 x10E3/uL   EOS (ABSOLUTE) 0.1 0.0 - 0.4 x10E3/uL   Basophils Absolute 0.1 0.0 - 0.2 x10E3/uL   Immature Granulocytes 0 Not Estab. %   Immature Grans (Abs) 0.0 0.0 - 0.1 x10E3/uL  Comprehensive metabolic panel  Result Value Ref Range   Glucose 173 (H) 70 - 99 mg/dL   BUN 18 8 - 27 mg/dL   Creatinine, Ser 6.57 0.57 - 1.00 mg/dL   eGFR 71 >84 ON/GEX/5.28   BUN/Creatinine Ratio 21 12 - 28   Sodium 138 134 - 144 mmol/L   Potassium 4.1 3.5 - 5.2 mmol/L   Chloride 101 96 - 106 mmol/L   CO2 20 20 - 29 mmol/L   Calcium 8.9 8.7 - 10.3 mg/dL   Total Protein 6.1 6.0 - 8.5 g/dL   Albumin 3.9 3.8 - 4.8 g/dL   Globulin, Total 2.2 1.5 - 4.5 g/dL   Albumin/Globulin Ratio 1.8 1.2 - 2.2   Bilirubin Total <0.2 0.0 - 1.2 mg/dL   Alkaline Phosphatase 87 44 - 121 IU/L   AST 9 0 - 40 IU/L   ALT 9 0 - 32 IU/L  Lipid panel  Result Value Ref Range   Cholesterol, Total 132 100 - 199 mg/dL   Triglycerides 841 (H) 0 - 149 mg/dL   HDL 40 >32 mg/dL   VLDL Cholesterol Cal 31 5 - 40 mg/dL   LDL Chol Calc (NIH) 61 0 - 99  mg/dL   Chol/HDL Ratio 3.3 0.0 - 4.4 ratio  TSH  Result Value Ref Range   TSH 1.440 0.450 - 4.500 uIU/mL  Urinalysis, Routine w reflex microscopic  Result Value Ref Range   Specific Gravity, UA 1.010 1.005 - 1.030   pH, UA 5.0 5.0 - 7.5   Color, UA Yellow Yellow   Appearance Ur Cloudy (A) Clear   Leukocytes,UA Trace (A) Negative   Protein,UA Negative Negative/Trace   Glucose, UA 3+ (A) Negative   Ketones, UA Negative Negative   RBC, UA Trace (A) Negative   Bilirubin, UA Negative Negative   Urobilinogen, Ur 0.2 0.2 - 1.0 mg/dL   Nitrite, UA Negative Negative   Microscopic Examination See below:   B12  Result Value Ref Range   Vitamin B-12 252 232 - 1,245 pg/mL  Vitamin D (25 hydroxy)  Result Value Ref Range   Vit D, 25-Hydroxy 39.6 30.0 - 100.0 ng/mL  HgB A1c  Result Value Ref Range   Hgb A1c MFr Bld 11.1 (H) 4.8 - 5.6 %   Est. average glucose Bld gHb Est-mCnc 272 mg/dL      Assessment & Plan:   Problem List Items Addressed This Visit     Acute midline thoracic back pain - Primary    Acute, unstable. Occurred after fall. Spinal xray ordered STAT to rule out fracture.  Torodol 30 mg given today for pain relief. Recommend continue Ibuprofen 600 mg every 6 hours, alternate with Tylenol 1000 mg, salonpas patch, and heat.        Relevant Orders   DG Thoracic Spine 2 View (Completed)   DG Lumbar Spine Complete (Completed)   DG Chest 2 View   CT LUMBAR SPINE W WO CONTRAST   CT THORACIC SPINE W WO CONTRAST     Follow up plan: Return if symptoms worsen or fail to improve.

## 2023-07-20 NOTE — Telephone Encounter (Signed)
Jacquelin Hawking, PA signed requested paperwork and it has been faxed back over to listed fax contact information.

## 2023-07-23 DIAGNOSIS — S22089B Unspecified fracture of T11-T12 vertebra, initial encounter for open fracture: Secondary | ICD-10-CM | POA: Insufficient documentation

## 2023-07-26 ENCOUNTER — Ambulatory Visit: Payer: 59 | Admitting: Family Medicine

## 2023-07-30 ENCOUNTER — Telehealth: Payer: Self-pay | Admitting: Nurse Practitioner

## 2023-07-30 NOTE — Telephone Encounter (Signed)
Paperwork has been sent to the CMA for her provider

## 2023-07-30 NOTE — Telephone Encounter (Signed)
Copied from CRM [id number]. Topic: General - Other >> Jul 27, 2023  4:27 PM Turkey B wrote: Reason for CRM: Zed from [facility] pharmacy called in says needs office notes for pt for glucose supplies

## 2023-07-31 NOTE — Telephone Encounter (Signed)
Copied from CRM [id number]. Topic: General - Other >> Jul 30, 2023  4:23 PM Macon Large wrote: Reason for CRM:  Zed with [facility] called for an update on the requests for office notes for the glucose supplies. Cb# [phone]

## 2023-08-02 ENCOUNTER — Encounter: Payer: Self-pay | Admitting: Emergency Medicine

## 2023-08-02 NOTE — Telephone Encounter (Signed)
Zed with [facility] has called to check the status of a form that was faxed over on 07/25/2023 in regards to needing patients most recent office notes. Per Zed, these notes need to be faxed back to fax # [phone].   Also Zed stated the office notes are for continuation for [employer].   Callback # [phone]

## 2023-08-07 ENCOUNTER — Ambulatory Visit: Payer: Medicare HMO | Admitting: Physician Assistant

## 2023-08-07 ENCOUNTER — Ambulatory Visit: Payer: 59 | Admitting: Physician Assistant

## 2023-08-07 NOTE — Progress Notes (Deleted)
          Acute Office Visit   Patient: Tami Cummings   DOB: 1946-03-24   77 y.o. Female  MRN: [id number] Visit Date: 08/07/2023  Today's healthcare provider: Oswaldo Conroy Shayden Bobier, PA-C  Introduced myself to the patient as a Secondary school teacher and provided education on APPs in clinical practice.    No chief complaint on file.  Subjective    HPI    Medications: Outpatient Medications Prior to Visit  Medication Sig   amLODipine (NORVASC) 10 MG tablet Take 1 tablet (10 mg total) by mouth daily.   atenolol (TENORMIN) 100 MG tablet Take 1 tablet (100 mg total) by mouth daily.   BD PEN NEEDLE MICRO U/F 32G X 6 MM MISC USE 4 TO 5 TIMES DAILY WITH INSULIN USE   Blood Glucose Monitoring Suppl (ONE TOUCH ULTRA 2) w/Device KIT 1 each by Does not apply route 2 (two) times daily.   Blood Glucose Monitoring Suppl (ONETOUCH VERIO) w/Device KIT Use to check blood sugar 2-3 times daily and document, bring results to provider visits.   BOOSTRIX 5-2.5-18.5 LF-MCG/0.5 injection    donepezil (ARICEPT) 5 MG tablet Take 5 mg by mouth at bedtime.   empagliflozin (JARDIANCE) 25 MG TABS tablet Take 1 tablet (25 mg total) by mouth daily before breakfast.   glucose blood (ONETOUCH ULTRA) test strip 1 each by Other route 2 (two) times daily. Use as instructed   glucose blood test strip Use to check blood sugar 2-3 times daily and document, bring results to provider visits.   hydrALAZINE (APRESOLINE) 10 MG tablet Take 1 tablet (10 mg total) by mouth 2 (two) times daily.   insulin degludec (TRESIBA FLEXTOUCH) 100 UNIT/ML FlexTouch Pen INJECT 40 UNITS SUBCUTANEOUSLY ONCE DAILY   Lancets (ONETOUCH ULTRASOFT) lancets Use to check blood sugar 2-3 times daily and document, bring results to provider visits.   Lancets (ONETOUCH ULTRASOFT) lancets 1 each by Other route 2 (two) times daily. Use as instructed   lovastatin (MEVACOR) 40 MG tablet Take 1 tablet (40 mg total) by mouth daily with breakfast.   OneTouch Delica Lancets 33G  MISC 1 applicator by Does not apply route in the morning and at bedtime.   Semaglutide,0.25 or 0.5MG /DOS, (OZEMPIC, 0.25 OR 0.5 MG/DOSE,) 2 MG/3ML SOPN Inject 0.25 mg into the skin once a week.   sertraline (ZOLOFT) 25 MG tablet Take 1 tablet (25 mg total) by mouth daily. You can increase to 50 mg in 2 weeks if you are still having mood symptoms.   spironolactone (ALDACTONE) 25 MG tablet Take 1 tablet (25 mg total) by mouth daily.   VITAMIN D, CHOLECALCIFEROL, PO Take 5,000 Units by mouth daily.   No facility-administered medications prior to visit.    Review of Systems  {Insert previous labs (optional):23779} {See past labs  Heme  Chem  Endocrine  Serology  Results Review (optional):1}   Objective    There were no vitals taken for this visit. {Insert last BP/Wt (optional):23777}{See vitals history (optional):1}   Physical Exam    No results found for any visits on 08/07/23.  Assessment & Plan      No follow-ups on file.

## 2023-08-10 ENCOUNTER — Ambulatory Visit: Payer: 59 | Admitting: Family Medicine

## 2023-08-10 ENCOUNTER — Telehealth: Payer: Self-pay | Admitting: Nurse Practitioner

## 2023-08-10 NOTE — Telephone Encounter (Signed)
Copied from CRM [id number]. Topic: General - Other >> Aug 10, 2023  4:01 PM Ja-Kwan M wrote: Reason for CRM: Zed with [facility] called back again for an update on the status of a form that was faxed on 07/25/2023 regarding patients most recent office notes for continuation for [employer]. Per Zed, these notes need to be faxed back to fax # [phone]. Cb# [phone]

## 2023-08-16 ENCOUNTER — Telehealth: Payer: Self-pay | Admitting: Nurse Practitioner

## 2023-08-16 NOTE — Telephone Encounter (Signed)
Copied from CRM [id number]. Topic: General - Other >> Aug 15, 2023  3:22 PM Ja-Kwan M wrote: Reason for CRM: Zed with [facility] called back once again for an update on the status of a form that was faxed on 07/25/2023 regarding patients most recent office notes for continuation for [employer]. Per Zed, these notes need to be faxed back to fax # [phone]. Cb# [phone]

## 2023-08-17 NOTE — Telephone Encounter (Signed)
Called eft message that the Rx order will not be filled and the order can be canceled for the [facility] [place]. Per Provider

## 2023-08-17 NOTE — Telephone Encounter (Signed)
Tami Cummings called back for an update, he says "we've been calling about this since 07/30/2023"

## 2023-08-28 ENCOUNTER — Telehealth: Payer: Self-pay | Admitting: Nurse Practitioner

## 2023-08-28 NOTE — Telephone Encounter (Signed)
Copied from CRM [id number]. Topic: General - Other >> Aug 28, 2023  3:01 PM Ja-Kwan M wrote: Reason for CRM: Zed with [facility] called back once again for an update on the status of a form that was faxed on 07/25/2023 regarding patients most recent office notes for continuation for [employer]. Per Zed, these notes need to be faxed back to fax # [phone]. Cb# [phone]

## 2023-10-11 ENCOUNTER — Emergency Department: Payer: 59

## 2023-10-11 ENCOUNTER — Other Ambulatory Visit: Payer: Self-pay

## 2023-10-11 ENCOUNTER — Inpatient Hospital Stay
Admission: EM | Admit: 2023-10-11 | Discharge: 2023-10-26 | DRG: 208 | Disposition: A | Discharge status: Skilled Nursing Facility | Payer: 59 | Source: Skilled Nursing Facility | Attending: Obstetrics and Gynecology | Admitting: Obstetrics and Gynecology

## 2023-10-11 DIAGNOSIS — Z66 Do not resuscitate: Secondary | ICD-10-CM | POA: Diagnosis not present

## 2023-10-11 DIAGNOSIS — I110 Hypertensive heart disease with heart failure: Secondary | ICD-10-CM | POA: Diagnosis present

## 2023-10-11 DIAGNOSIS — I5033 Acute on chronic diastolic (congestive) heart failure: Secondary | ICD-10-CM | POA: Diagnosis present

## 2023-10-11 DIAGNOSIS — E785 Hyperlipidemia, unspecified: Secondary | ICD-10-CM | POA: Diagnosis present

## 2023-10-11 DIAGNOSIS — R0609 Other forms of dyspnea: Secondary | ICD-10-CM | POA: Diagnosis not present

## 2023-10-11 DIAGNOSIS — I824Z1 Acute embolism and thrombosis of unspecified deep veins of right distal lower extremity: Secondary | ICD-10-CM | POA: Diagnosis present

## 2023-10-11 DIAGNOSIS — R6521 Severe sepsis with septic shock: Secondary | ICD-10-CM | POA: Diagnosis not present

## 2023-10-11 DIAGNOSIS — J690 Pneumonitis due to inhalation of food and vomit: Secondary | ICD-10-CM | POA: Diagnosis not present

## 2023-10-11 DIAGNOSIS — E876 Hypokalemia: Secondary | ICD-10-CM | POA: Diagnosis present

## 2023-10-11 DIAGNOSIS — I700 Atherosclerosis of aorta: Secondary | ICD-10-CM | POA: Diagnosis present

## 2023-10-11 DIAGNOSIS — J441 Chronic obstructive pulmonary disease with (acute) exacerbation: Secondary | ICD-10-CM | POA: Diagnosis present

## 2023-10-11 DIAGNOSIS — J9621 Acute and chronic respiratory failure with hypoxia: Secondary | ICD-10-CM | POA: Diagnosis present

## 2023-10-11 DIAGNOSIS — Z515 Encounter for palliative care: Secondary | ICD-10-CM | POA: Diagnosis not present

## 2023-10-11 DIAGNOSIS — Z888 Allergy status to other drugs, medicaments and biological substances status: Secondary | ICD-10-CM

## 2023-10-11 DIAGNOSIS — D696 Thrombocytopenia, unspecified: Secondary | ICD-10-CM | POA: Diagnosis not present

## 2023-10-11 DIAGNOSIS — J9601 Acute respiratory failure with hypoxia: Secondary | ICD-10-CM | POA: Diagnosis not present

## 2023-10-11 DIAGNOSIS — J189 Pneumonia, unspecified organism: Secondary | ICD-10-CM | POA: Diagnosis present

## 2023-10-11 DIAGNOSIS — G9341 Metabolic encephalopathy: Secondary | ICD-10-CM | POA: Diagnosis not present

## 2023-10-11 DIAGNOSIS — I82469 Acute embolism and thrombosis of unspecified calf muscular vein: Secondary | ICD-10-CM | POA: Diagnosis not present

## 2023-10-11 DIAGNOSIS — Z7985 Long-term (current) use of injectable non-insulin antidiabetic drugs: Secondary | ICD-10-CM

## 2023-10-11 DIAGNOSIS — F05 Delirium due to known physiological condition: Secondary | ICD-10-CM | POA: Diagnosis present

## 2023-10-11 DIAGNOSIS — Z87891 Personal history of nicotine dependence: Secondary | ICD-10-CM

## 2023-10-11 DIAGNOSIS — J440 Chronic obstructive pulmonary disease with acute lower respiratory infection: Secondary | ICD-10-CM | POA: Diagnosis present

## 2023-10-11 DIAGNOSIS — F0390 Unspecified dementia without behavioral disturbance: Secondary | ICD-10-CM | POA: Diagnosis present

## 2023-10-11 DIAGNOSIS — Z1152 Encounter for screening for COVID-19: Secondary | ICD-10-CM | POA: Diagnosis not present

## 2023-10-11 DIAGNOSIS — Z79899 Other long term (current) drug therapy: Secondary | ICD-10-CM

## 2023-10-11 DIAGNOSIS — Z9049 Acquired absence of other specified parts of digestive tract: Secondary | ICD-10-CM

## 2023-10-11 DIAGNOSIS — Z981 Arthrodesis status: Secondary | ICD-10-CM

## 2023-10-11 DIAGNOSIS — Z90710 Acquired absence of both cervix and uterus: Secondary | ICD-10-CM

## 2023-10-11 DIAGNOSIS — Z7984 Long term (current) use of oral hypoglycemic drugs: Secondary | ICD-10-CM

## 2023-10-11 DIAGNOSIS — Y95 Nosocomial condition: Secondary | ICD-10-CM | POA: Diagnosis present

## 2023-10-11 DIAGNOSIS — Z9842 Cataract extraction status, left eye: Secondary | ICD-10-CM

## 2023-10-11 DIAGNOSIS — J9622 Acute and chronic respiratory failure with hypercapnia: Secondary | ICD-10-CM | POA: Diagnosis present

## 2023-10-11 DIAGNOSIS — R0902 Hypoxemia: Secondary | ICD-10-CM | POA: Diagnosis present

## 2023-10-11 DIAGNOSIS — I2699 Other pulmonary embolism without acute cor pulmonale: Secondary | ICD-10-CM | POA: Diagnosis not present

## 2023-10-11 DIAGNOSIS — Z7901 Long term (current) use of anticoagulants: Secondary | ICD-10-CM

## 2023-10-11 DIAGNOSIS — I272 Pulmonary hypertension, unspecified: Secondary | ICD-10-CM | POA: Diagnosis present

## 2023-10-11 DIAGNOSIS — Z9841 Cataract extraction status, right eye: Secondary | ICD-10-CM

## 2023-10-11 DIAGNOSIS — Z86718 Personal history of other venous thrombosis and embolism: Secondary | ICD-10-CM

## 2023-10-11 DIAGNOSIS — I2489 Other forms of acute ischemic heart disease: Secondary | ICD-10-CM | POA: Diagnosis present

## 2023-10-11 DIAGNOSIS — Z90722 Acquired absence of ovaries, bilateral: Secondary | ICD-10-CM

## 2023-10-11 DIAGNOSIS — J90 Pleural effusion, not elsewhere classified: Secondary | ICD-10-CM | POA: Diagnosis not present

## 2023-10-11 DIAGNOSIS — Z794 Long term (current) use of insulin: Secondary | ICD-10-CM

## 2023-10-11 DIAGNOSIS — Z8249 Family history of ischemic heart disease and other diseases of the circulatory system: Secondary | ICD-10-CM

## 2023-10-11 DIAGNOSIS — A419 Sepsis, unspecified organism: Secondary | ICD-10-CM | POA: Diagnosis not present

## 2023-10-11 DIAGNOSIS — R571 Hypovolemic shock: Secondary | ICD-10-CM | POA: Diagnosis present

## 2023-10-11 DIAGNOSIS — E1142 Type 2 diabetes mellitus with diabetic polyneuropathy: Secondary | ICD-10-CM | POA: Diagnosis present

## 2023-10-11 DIAGNOSIS — Z961 Presence of intraocular lens: Secondary | ICD-10-CM | POA: Diagnosis present

## 2023-10-11 DIAGNOSIS — E1165 Type 2 diabetes mellitus with hyperglycemia: Secondary | ICD-10-CM | POA: Diagnosis present

## 2023-10-11 DIAGNOSIS — Z7189 Other specified counseling: Secondary | ICD-10-CM | POA: Diagnosis not present

## 2023-10-11 DIAGNOSIS — Z9079 Acquired absence of other genital organ(s): Secondary | ICD-10-CM

## 2023-10-11 LAB — BASIC METABOLIC PANEL
Anion gap: 9 (ref 5–15)
BUN: 15 mg/dL (ref 8–23)
CO2: 34 mmol/L — ABNORMAL HIGH (ref 22–32)
Calcium: 8.6 mg/dL — ABNORMAL LOW (ref 8.9–10.3)
Chloride: 99 mmol/L (ref 98–111)
Creatinine, Ser: 0.38 mg/dL — ABNORMAL LOW (ref 0.44–1.00)
GFR, Estimated: >60 mL/min (ref >60)
Glucose, Bld: 203 mg/dL — ABNORMAL HIGH (ref 70–99)
Potassium: 2.9 mmol/L — ABNORMAL LOW (ref 3.5–5.1)
Sodium: 142 mmol/L (ref 135–145)

## 2023-10-11 LAB — PROCALCITONIN: Procalcitonin: <0.1 ng/mL

## 2023-10-11 LAB — HEMOGLOBIN A1C
Hgb A1c MFr Bld: 6.2 % — ABNORMAL HIGH (ref 4.8–5.6)
Mean Plasma Glucose: 131.24 mg/dL

## 2023-10-11 LAB — SARS CORONAVIRUS 2 BY RT PCR: SARS Coronavirus 2 by RT PCR: NEGATIVE

## 2023-10-11 LAB — BRAIN NATRIURETIC PEPTIDE: B Natriuretic Peptide: 314.2 pg/mL — ABNORMAL HIGH (ref 0.0–100.0)

## 2023-10-11 LAB — CBC
HCT: 36.3 % (ref 36.0–46.0)
Hemoglobin: 10.9 g/dL — ABNORMAL LOW (ref 12.0–15.0)
MCH: 26.5 pg (ref 26.0–34.0)
MCHC: 30 g/dL (ref 30.0–36.0)
MCV: 88.3 fL (ref 80.0–100.0)
Platelets: 203 10*3/uL (ref 150–400)
RBC: 4.11 MIL/uL (ref 3.87–5.11)
RDW: 14.6 % (ref 11.5–15.5)
WBC: 6.9 10*3/uL (ref 4.0–10.5)
nRBC: 0 % (ref 0.0–0.2)

## 2023-10-11 LAB — TROPONIN I (HIGH SENSITIVITY)
Troponin I (High Sensitivity): 15 ng/L (ref <18)
Troponin I (High Sensitivity): 15 ng/L (ref <18)

## 2023-10-11 LAB — CBG MONITORING, ED: Glucose-Capillary: 206 mg/dL — ABNORMAL HIGH (ref 70–99)

## 2023-10-11 MED ORDER — INSULIN ASPART 100 UNIT/ML IJ SOLN
0.0000 [IU] | Freq: Every day | INTRAMUSCULAR | Status: DC
  Administered 2023-10-11 – 2023-10-12 (×2): 2 [IU] via SUBCUTANEOUS
  Filled 2023-10-11 (×2): qty 1

## 2023-10-11 MED ORDER — SPIRONOLACTONE 25 MG PO TABS
25.0000 mg | ORAL_TABLET | Freq: Every day | ORAL | Status: DC
  Administered 2023-10-11 – 2023-10-15 (×4): 25 mg via ORAL
  Filled 2023-10-11 (×5): qty 1

## 2023-10-11 MED ORDER — INSULIN DEGLUDEC 100 UNIT/ML ~~LOC~~ SOPN: 20.0000 [IU] | PEN_INJECTOR | Freq: Every day | SUBCUTANEOUS | Status: DC

## 2023-10-11 MED ORDER — POTASSIUM CHLORIDE CRYS ER 20 MEQ PO TBCR
40.0000 meq | EXTENDED_RELEASE_TABLET | ORAL | Status: AC
  Administered 2023-10-11 (×2): 40 meq via ORAL
  Filled 2023-10-11 (×2): qty 2

## 2023-10-11 MED ORDER — FUROSEMIDE 10 MG/ML IJ SOLN
40.0000 mg | Freq: Once | INTRAMUSCULAR | Status: AC
Start: 2023-10-11 — End: 2023-10-11
  Administered 2023-10-11: 40 mg via INTRAVENOUS
  Filled 2023-10-11: qty 4

## 2023-10-11 MED ORDER — VANCOMYCIN HCL IN DEXTROSE 1-5 GM/200ML-% IV SOLN: 1000.0000 mg | Freq: Once | INTRAVENOUS | Status: DC

## 2023-10-11 MED ORDER — AMLODIPINE BESYLATE 10 MG PO TABS
10.0000 mg | ORAL_TABLET | Freq: Every day | ORAL | Status: DC
  Administered 2023-10-11 – 2023-10-12 (×2): 10 mg via ORAL
  Filled 2023-10-11 (×2): qty 2
  Filled 2023-10-11: qty 1

## 2023-10-11 MED ORDER — INSULIN ASPART 100 UNIT/ML IJ SOLN
0.0000 [IU] | Freq: Three times a day (TID) | INTRAMUSCULAR | Status: DC
  Administered 2023-10-12 (×2): 3 [IU] via SUBCUTANEOUS
  Administered 2023-10-13: 5 [IU] via SUBCUTANEOUS
  Administered 2023-10-13: 3 [IU] via SUBCUTANEOUS
  Administered 2023-10-14: 2 [IU] via SUBCUTANEOUS
  Administered 2023-10-14 – 2023-10-15 (×2): 3 [IU] via SUBCUTANEOUS
  Administered 2023-10-15: 2 [IU] via SUBCUTANEOUS
  Administered 2023-10-15: 3 [IU] via SUBCUTANEOUS
  Administered 2023-10-16: 2 [IU] via SUBCUTANEOUS
  Administered 2023-10-16: 3 [IU] via SUBCUTANEOUS
  Filled 2023-10-11 (×11): qty 1

## 2023-10-11 MED ORDER — ENOXAPARIN SODIUM 40 MG/0.4ML IJ SOSY
0.5000 mg/kg | PREFILLED_SYRINGE | INTRAMUSCULAR | Status: DC
  Administered 2023-10-11 – 2023-10-15 (×5): 40 mg via SUBCUTANEOUS
  Filled 2023-10-11 (×5): qty 0.4

## 2023-10-11 MED ORDER — SODIUM CHLORIDE 0.9 % IV SOLN
500.0000 mg | Freq: Once | INTRAVENOUS | Status: DC
  Filled 2023-10-11: qty 5

## 2023-10-11 MED ORDER — SODIUM CHLORIDE 0.9 % IV SOLN
2.0000 g | Freq: Once | INTRAVENOUS | Status: DC
  Filled 2023-10-11: qty 12.5

## 2023-10-11 NOTE — ED Provider Notes (Signed)
[facility] Provider Note    None    (approximate)   History   Shortness of Breath Level V Caveat: Dementia  HPI  Tami Cummings is a 77 y.o. female very complicated past medical history presents from [facility] with altered mental status hypoxia and concern for pneumonia.  Patient unable to provide much additional history.  Found to be satting in the low 80s on room air.     Physical Exam   Triage Vital Signs: ED Triage Vitals  Encounter Vitals Group     BP 10/11/23 1143 (!) 157/66     Systolic BP Percentile --      Diastolic BP Percentile --      Pulse Rate 10/11/23 1143 70     Resp 10/11/23 1143 19     Temp 10/11/23 1143 98 F (36.7 C)     Temp Source 10/11/23 1143 Oral     SpO2 10/11/23 1143 92 %     Weight 10/11/23 1144 171 lb 15.3 oz (78 kg)     Height 10/11/23 1144 5\' 1"  (1.549 m)     Head Circumference --      Peak Flow --      Pain Score 10/11/23 1144 0     Pain Loc --      Pain Education --      Exclude from Growth Chart --     Most recent vital signs: Vitals:   10/11/23 1143 10/11/23 1417  BP: (!) 157/66 (!) 167/68  Pulse: 70 66  Resp: 19 16  Temp: 98 F (36.7 C) 98.2 F (36.8 C)  SpO2: 92% 94%     Constitutional: Alert  Eyes: Conjunctivae are normal.  Head: Atraumatic. Nose: No congestion/rhinnorhea. Mouth/Throat: Mucous membranes are moist.   Neck: Painless ROM.  Cardiovascular:   Good peripheral circulation. Respiratory: Normal respiratory effort.  Diminished bibasilar breath sounds. Gastrointestinal: Soft and nontender.  Musculoskeletal:  no deformity, bilateral lower extremity pitting edema. Neurologic:  MAE spontaneously. No gross focal neurologic deficits are appreciated.  Skin:  Skin is warm, dry and intact. No rash noted. Psychiatric: Mood and affect are normal. Speech and behavior are normal.    ED Results / Procedures / Treatments   Labs (all labs ordered are listed, but  only abnormal results are displayed) Labs Reviewed  BASIC METABOLIC PANEL - Abnormal; Notable for the following components:      Result Value   Potassium 2.9 (*)    CO2 34 (*)    Glucose, Bld 203 (*)    Creatinine, Ser 0.38 (*)    Calcium 8.6 (*)    All other components within normal limits  CBC - Abnormal; Notable for the following components:   Hemoglobin 10.9 (*)    All other components within normal limits  CULTURE, BLOOD (ROUTINE X 2)  CULTURE, BLOOD (ROUTINE X 2)  BRAIN NATRIURETIC PEPTIDE  LACTIC ACID, PLASMA  LACTIC ACID, PLASMA  TROPONIN I (HIGH SENSITIVITY)  TROPONIN I (HIGH SENSITIVITY)     EKG  ED ECG REPORT I, Willy Eddy, the attending physician, personally viewed and interpreted this ECG.   Date: 10/11/2023  EKG Time: 11:48  Rate: 70  Rhythm: sinuys   Axis: normal  Intervals: normal  ST&T Change: no stemi, no depresion    RADIOLOGY Please see ED Course for my review and interpretation.  I personally reviewed all radiographic images ordered to evaluate for the above acute complaints and reviewed radiology reports and findings.  These findings were personally discussed with the patient.  Please see medical record for radiology report.    PROCEDURES:  Critical Care performed: Yes, see critical care procedure note(s)  .Critical Care  Performed by: Willy Eddy, MD Authorized by: Willy Eddy, MD   Critical care provider statement:    Critical care time (minutes):  35   Critical care was necessary to treat or prevent imminent or life-threatening deterioration of the following conditions:  Respiratory failure   Critical care was time spent personally by me on the following activities:  Ordering and performing treatments and interventions, ordering and review of laboratory studies, ordering and review of radiographic studies, pulse oximetry, re-evaluation of patient's condition, review of old charts, obtaining history from patient or  surrogate, examination of patient, evaluation of patient's response to treatment, discussions with primary provider, discussions with consultants and development of treatment plan with patient or surrogate    MEDICATIONS ORDERED IN ED: Medications  vancomycin (VANCOCIN) IVPB 1000 mg/200 mL premix (has no administration in time range)  ceFEPIme (MAXIPIME) 2 g in sodium chloride 0.9 % 100 mL IVPB (has no administration in time range)  azithromycin (ZITHROMAX) 500 mg in sodium chloride 0.9 % 250 mL IVPB (has no administration in time range)  furosemide (LASIX) injection 40 mg (has no administration in time range)     IMPRESSION / MDM / ASSESSMENT AND PLAN / ED COURSE  I reviewed the triage vital signs and the nursing notes.                              Differential diagnosis includes, but is not limited to, Asthma, copd, CHF, pna, ptx, malignancy, Pe, anemia  Patient presenting to the ER for evaluation of symptoms as described above.  Based on symptoms, risk factors and considered above differential, this presenting complaint could reflect a potentially life-threatening illness therefore the patient will be placed on continuous pulse oximetry and telemetry for monitoring.  Laboratory evaluation will be sent to evaluate for the above complaints.  Chest x-ray on my review and interpretation shows pulmonary edema, likely opacities as well as effusions.  Per radiology, lung opacities.  Given recent hospitalization and extended healthcare facility exposure will order septic workup though she is not febrile but she does have new O2 requirement.  Troponin negative.  No documented history of CHF.  Will add on BNP.  Will cover with antibiotics for suspected pneumonia will also give dose of IV Lasix she does appear edematous and volume overloaded.  Given her acute respiratory failure with hypoxia will consult hospitalist for admission.        FINAL CLINICAL IMPRESSION(S) / ED DIAGNOSES   Final  diagnoses:  Acute respiratory failure with hypoxia (HCC)     Rx / DC Orders   ED Discharge Orders     None        Note:  This document was prepared using Dragon voice recognition software and may include unintentional dictation errors.    Willy Eddy, MD 10/11/23 [phone]

## 2023-10-11 NOTE — Consult Note (Signed)
PHARMACY - BRIEF ANTIBIOTIC NOTE   Pharmacy has received consult(s) for cefepime and vancomycin from an ED provider. The patient's profile has been reviewed for ht/wt/allergies/indication/available labs.    One time order(s) placed for: cefepime 2 g and vancomycin 1000 mg  Further antibiotics/pharmacy consults should be ordered by admitting physician if indicated.                       Thank you,  Will M. Dareen Piano, PharmD Clinical Pharmacist 10/11/2023 5:19 PM

## 2023-10-11 NOTE — ED Triage Notes (Signed)
First Nurse Note;  Pt via [facility] from [facility]. Doctor told her to come into the ED due to possible PNA. Pt is A&Ox4 and NAD. Initial 88% on RA, 96% on 4L [place] 71 HR  26 ETCO2 162/62 BP

## 2023-10-11 NOTE — H&P (Addendum)
History and Physical    Tami Cummings [id number]:[id number] DOB: 1946-11-13 DOA: 10/11/2023  PCP: Larae Grooms, NP  Patient coming from: SNF rehab  I have personally briefly reviewed patient's old medical records in [facility]  Chief Complaint: dyspnea  HPI: Tami Cummings is a 77 y.o. female with medical history significant of DM2, dementia who presented from [facility] for concern for possible PNA.  Pt was not completely oriented, but was coherent and able to answer ROS questions appropriately.  Only listed contact daughter was not reachable by phone.  Pt reported mild dyspnea for the last 3-4 days.  No cough, no sore throat.  No chest pain, abdominal pain, N/V/D, dysuria.  Pt said her back wound is all healed up.  Pt said she has been having normal oral intake, and despite lack of teeth, she said she can chew and swallow without problem.  Pt said depending on the situation, she walks with walker, cane, or unassisted.  Of note, pt was recently hospitalized in [facility] from 08/31/23 to 09/10/23 for wound dehiscence.  The wound originally came from T9-12 posterior fusion on 07/30/2023 for a T11 chalk stick fracture suffered from a fall in July.  Pt was taken to the OR on 08/31/2023 for a thoracic wound washout and complex wound closure.  Pt was discharged to SNF rehab with PICC line for vanc and Rocephin til 10/12/23.     ED Course: initial vitals per triage note: afebrile, pulse 71, BP 162/62, sating 88% on RA, and was put on 4L [place] with sats 96%.  Labs notable for potassium 2.9, trop neg x2.  CXR showed "Bilateral pleural effusions with the opacities. Enlarged heart with vascular congestion and trace edema."  Pt received IV lasix 40 mg x1 in the ED and was admitted.   Assessment/Plan  Acute hypoxemic respiratory failure --Desat to 88% on room air, new O2 requirement of 4L.  CXR showed bilateral pleural effusions with vascular congestion and trace edema, which is likely  the cause of pt's respiratory failure.  No fever, cough, leukocytosis to suggest PNA, and pt has been on vanc and ceftriaxone PTA. Plan: --Continue supplemental O2 to keep sats >=92%, wean as tolerated --diurese --hold abx  Possible CHF --no known prior hx of CHF.   --BNP --IV lasix 40  --Strict I/O --Echo  Recent thoracic spine wound dehiscence --Pt was discharged to SNF rehab with PICC line for vanc and Rocephin til 10/12/23, and wound care. --wound over thoracic spine looks completely healed.  Pt is done with abx.  DM2 --according to [facility] record, pt was on short-acting insulin, but not long-acting insulin. --obtain A1c --ACHS and SSI for now  HTN --cont home amlodipine and spironolactone  Hypokalemia --monitor and supplement PRN   DVT prophylaxis: Lovenox SQ Code Status: Full code  Family Communication: called only contact daughter at both numbers, didn't reach.  Disposition Plan: SNF rehab  Consults called:  Level of care: Med-Surg   Review of Systems: As per HPI otherwise complete review of systems negative.   Past Medical History:  Diagnosis Date   Arthritis    knee   COPD (chronic obstructive pulmonary disease) (HCC)    Diabetes mellitus without complication (HCC)    type 2   Dyspnea    Hyperlipidemia    Hypertension    controlled   Neuromuscular disorder (HCC)    numbness in hands and feet   Shoulder pain, right    Wears dentures    upper  and lower    Past Surgical History:  Procedure Laterality Date   BREAST BIOPSY Right 04/12/2016   stereo bx/clip- neg   CARPAL TUNNEL RELEASE  06/17/2000   CATARACT EXTRACTION W/PHACO Left 09/20/2015   Procedure: CATARACT EXTRACTION PHACO AND INTRAOCULAR LENS PLACEMENT (IOC);  Surgeon: Sherald Hess, MD;  Location: [facility];  Service: Ophthalmology;  Laterality: Left;  DIABETIC - insulin   CATARACT EXTRACTION W/PHACO Right 06/03/2018   Procedure: CATARACT EXTRACTION PHACO AND INTRAOCULAR  LENS PLACEMENT (IOC) RIGHT DIABETES;  Surgeon: Nevada Crane, MD;  Location: [facility];  Service: Ophthalmology;  Laterality: Right;  Diabetes-insulin dependent   CESAREAN SECTION     x2   CHOLECYSTECTOMY     EYE SURGERY     TOTAL ABDOMINAL HYSTERECTOMY W/ BILATERAL SALPINGOOPHORECTOMY       reports that she quit smoking about 10 years ago. Her smoking use included cigarettes. She started smoking about 30 years ago. She has a 20 pack-year smoking history. She has never used smokeless tobacco. She reports that she does not drink alcohol and does not use drugs.  Allergies  Allergen Reactions   Actos [Pioglitazone] Other (See Comments)    CHF   Amlodipine Swelling    Leg swelling    Benazepril Other (See Comments)    angioedema   Hydrochlorothiazide Other (See Comments)    angioedema   Metformin And Related Diarrhea    Family History  Problem Relation Age of Onset   Heart disease Father    Cancer Sister    Cancer Brother    Breast cancer Neg Hx     Prior to Admission medications   Medication Sig Start Date End Date Taking? Authorizing Provider  amLODipine (NORVASC) 10 MG tablet Take 1 tablet (10 mg total) by mouth daily. 05/07/23   Larae Grooms, NP  atenolol (TENORMIN) 100 MG tablet Take 1 tablet (100 mg total) by mouth daily. 05/07/23   Larae Grooms, NP  BD PEN NEEDLE MICRO U/F 32G X 6 MM MISC USE 4 TO 5 TIMES DAILY WITH INSULIN USE 10/24/21   Olevia Perches P, DO  Blood Glucose Monitoring Suppl (ONE TOUCH ULTRA 2) w/Device KIT 1 each by Does not apply route 2 (two) times daily. 05/15/23   Larae Grooms, NP  Blood Glucose Monitoring Suppl (ONETOUCH VERIO) w/Device KIT Use to check blood sugar 2-3 times daily and document, bring results to provider visits. 06/12/22   Larae Grooms, NP  Leda Min 5-2.5-18.5 LF-MCG/0.5 injection  05/13/23   [provider]  donepezil (ARICEPT) 5 MG tablet Take 5 mg by mouth at bedtime. 03/05/23 04/04/23  [provider]  empagliflozin (JARDIANCE) 25 MG TABS tablet Take 1 tablet (25 mg total) by mouth daily before breakfast. 05/07/23   Larae Grooms, NP  glucose blood (ONETOUCH ULTRA) test strip 1 each by Other route 2 (two) times daily. Use as instructed 05/15/23   Larae Grooms, NP  glucose blood test strip Use to check blood sugar 2-3 times daily and document, bring results to provider visits. 05/23/22   Larae Grooms, NP  hydrALAZINE (APRESOLINE) 10 MG tablet Take 1 tablet (10 mg total) by mouth 2 (two) times daily. 05/07/23   Larae Grooms, NP  insulin degludec (TRESIBA FLEXTOUCH) 100 UNIT/ML FlexTouch Pen INJECT 40 UNITS SUBCUTANEOUSLY ONCE DAILY 05/02/23   Larae Grooms, NP  Lancets [facility] ULTRASOFT) lancets Use to check blood sugar 2-3 times daily and document, bring results to provider visits. 06/12/22   Larae Grooms, NP  Lancets (ONETOUCH ULTRASOFT) lancets 1 each by Other route 2 (two) times daily. Use as instructed 05/15/23   Larae Grooms, NP  lovastatin (MEVACOR) 40 MG tablet Take 1 tablet (40 mg total) by mouth daily with breakfast. 05/07/23   Larae Grooms, NP  OneTouch Delica Lancets 33G MISC 1 applicator by Does not apply route in the morning and at bedtime. 05/23/22   Larae Grooms, NP  Semaglutide,0.25 or 0.5MG /DOS, (OZEMPIC, 0.25 OR 0.5 MG/DOSE,) 2 MG/3ML SOPN Inject 0.25 mg into the skin once a week. 05/28/23   Larae Grooms, NP  sertraline (ZOLOFT) 25 MG tablet Take 1 tablet (25 mg total) by mouth daily. You can increase to 50 mg in 2 weeks if you are still having mood symptoms. 06/29/23   Mecum, Erin E, PA-C  spironolactone (ALDACTONE) 25 MG tablet Take 1 tablet (25 mg total) by mouth daily. 05/07/23   Larae Grooms, NP  VITAMIN D, CHOLECALCIFEROL, PO Take 5,000 Units by mouth daily.    [provider]    Physical Exam: Vitals:   10/11/23 1143 10/11/23 1144 10/11/23 1417  BP: (!) 157/66  (!) 167/68  Pulse: 70  66  Resp: 19  16   Temp: 98 F (36.7 C)  98.2 F (36.8 C)  TempSrc: Oral  Oral  SpO2: 92%  94%  Weight:  78 kg   Height:  5\' 1"  (1.549 m)     Constitutional: NAD, alert, oriented to person only HEENT: conjunctivae and lids normal, EOMI CV: No cyanosis.   RESP: normal respiratory effort, on 3L Extremities: edema in BLE SKIN: warm, dry Neuro: II - XII grossly intact.   Psych: Normal mood and affect.     Labs on Admission: I have personally reviewed labs and imaging studies  Time spent: 75 minutes  Darlin Priestly MD [facility]  If 7PM-7AM, please contact night-coverage 10/11/2023, 6:12 PM

## 2023-10-11 NOTE — ED Triage Notes (Signed)
See first nurse note: Pt is confused and not able to tell this RN if this is her baseline. When pt asked where she was pt stated [place] [facility]". Pt able to give name and DOB, pt unsure of year.   Pt has a PICC line to L upper arm, pt unsure of why,

## 2023-10-12 ENCOUNTER — Encounter: Payer: Self-pay | Admitting: Hospitalist

## 2023-10-12 ENCOUNTER — Inpatient Hospital Stay (HOSPITAL_COMMUNITY)
Admit: 2023-10-12 | Discharge: 2023-10-12 | Disposition: A | Discharge status: Home / Self Care | Payer: 59 | Attending: Hospitalist

## 2023-10-12 DIAGNOSIS — R0609 Other forms of dyspnea: Secondary | ICD-10-CM

## 2023-10-12 DIAGNOSIS — R0902 Hypoxemia: Secondary | ICD-10-CM | POA: Diagnosis not present

## 2023-10-12 LAB — BASIC METABOLIC PANEL
Anion gap: 9 (ref 5–15)
BUN: 9 mg/dL (ref 8–23)
CO2: 36 mmol/L — ABNORMAL HIGH (ref 22–32)
Calcium: 8.7 mg/dL — ABNORMAL LOW (ref 8.9–10.3)
Chloride: 99 mmol/L (ref 98–111)
Creatinine, Ser: 0.31 mg/dL — ABNORMAL LOW (ref 0.44–1.00)
GFR, Estimated: >60 mL/min (ref >60)
Glucose, Bld: 160 mg/dL — ABNORMAL HIGH (ref 70–99)
Potassium: 3.4 mmol/L — ABNORMAL LOW (ref 3.5–5.1)
Sodium: 144 mmol/L (ref 135–145)

## 2023-10-12 LAB — CBC
HCT: 38.3 % (ref 36.0–46.0)
Hemoglobin: 11.4 g/dL — ABNORMAL LOW (ref 12.0–15.0)
MCH: 25.9 pg — ABNORMAL LOW (ref 26.0–34.0)
MCHC: 29.8 g/dL — ABNORMAL LOW (ref 30.0–36.0)
MCV: 86.8 fL (ref 80.0–100.0)
Platelets: 206 10*3/uL (ref 150–400)
RBC: 4.41 MIL/uL (ref 3.87–5.11)
RDW: 14.5 % (ref 11.5–15.5)
WBC: 6 10*3/uL (ref 4.0–10.5)
nRBC: 0 % (ref 0.0–0.2)

## 2023-10-12 LAB — ECHOCARDIOGRAM COMPLETE
AR max vel: 2.09 cm2
AV Area VTI: 2.28 cm2
AV Area mean vel: 1.72 cm2
AV Mean grad: 4 mm[Hg]
AV Peak grad: 6.9 mm[Hg]
Ao pk vel: 1.31 m/s
Area-P 1/2: 4.63 cm2
Height: 61 in
MV VTI: 2.28 cm2
S' Lateral: 3.1 cm
Weight: 2751.34 [oz_av]

## 2023-10-12 LAB — MRSA NEXT GEN BY PCR, NASAL: MRSA by PCR Next Gen: NOT DETECTED

## 2023-10-12 LAB — CBG MONITORING, ED
Glucose-Capillary: 160 mg/dL — ABNORMAL HIGH (ref 70–99)
Glucose-Capillary: 161 mg/dL — ABNORMAL HIGH (ref 70–99)

## 2023-10-12 LAB — GLUCOSE, CAPILLARY: Glucose-Capillary: 229 mg/dL — ABNORMAL HIGH (ref 70–99)

## 2023-10-12 LAB — MAGNESIUM: Magnesium: 2.1 mg/dL (ref 1.7–2.4)

## 2023-10-12 MED ORDER — HALOPERIDOL LACTATE 5 MG/ML IJ SOLN
2.0000 mg | Freq: Four times a day (QID) | INTRAMUSCULAR | Status: DC | PRN
  Administered 2023-10-12: 2 mg via INTRAVENOUS

## 2023-10-12 MED ORDER — QUETIAPINE FUMARATE 25 MG PO TABS
50.0000 mg | ORAL_TABLET | Freq: Every day | ORAL | Status: DC
  Administered 2023-10-12: 50 mg via ORAL
  Filled 2023-10-12: qty 2

## 2023-10-12 MED ORDER — FUROSEMIDE 10 MG/ML IJ SOLN
40.0000 mg | Freq: Two times a day (BID) | INTRAMUSCULAR | Status: DC
Start: 2023-10-12 — End: 2023-10-14
  Administered 2023-10-12 – 2023-10-13 (×4): 40 mg via INTRAVENOUS
  Filled 2023-10-12 (×4): qty 4

## 2023-10-12 MED ORDER — POTASSIUM CHLORIDE CRYS ER 20 MEQ PO TBCR
40.0000 meq | EXTENDED_RELEASE_TABLET | Freq: Once | ORAL | Status: AC
  Administered 2023-10-12: 40 meq via ORAL
  Filled 2023-10-12: qty 2

## 2023-10-12 MED ORDER — HALOPERIDOL LACTATE 5 MG/ML IJ SOLN
5.0000 mg | Freq: Four times a day (QID) | INTRAMUSCULAR | Status: DC | PRN
  Administered 2023-10-12 – 2023-10-13 (×2): 5 mg via INTRAVENOUS
  Filled 2023-10-12 (×3): qty 1

## 2023-10-12 MED ORDER — HALOPERIDOL LACTATE 5 MG/ML IJ SOLN
INTRAMUSCULAR | Status: AC
  Filled 2023-10-12: qty 1

## 2023-10-12 NOTE — ED Notes (Signed)
Pt continues to yell out. Pt is unable to be redirected. Secure chat sent to attending provider requesting PRN medication for agitation.

## 2023-10-12 NOTE — Progress Notes (Signed)
PROGRESS NOTE    Tami Cummings  CZY:[id number] DOB: 1946-05-12 DOA: 10/11/2023 PCP: Larae Grooms, NP  [id number]  LOS: 1 day   Brief hospital course:   Assessment & Plan: Tami Cummings is a 77 y.o. female with medical history significant of DM2, dementia who presented from [facility] for concern for possible PNA.    Acute hypoxemic respiratory failure --Desat to 88% on room air, new O2 requirement of 4L.  CXR showed bilateral pleural effusions with vascular congestion and trace edema, which is likely the cause of pt's respiratory failure.  No fever, cough, leukocytosis to suggest PNA, and pt has been on vanc and ceftriaxone PTA. Plan: --Continue supplemental O2 to keep sats >=92%, wean as tolerated --diurese --hold abx   Possible CHF --no known prior hx of CHF.   --BNP 314 Plan: --cont IV lasix 40 BID --Echo   Hospital delirium Baseline dementia --getting agitated and trying to get out of bed  --IV haldol PRN --start seroquel 50 mg nightly  Recent thoracic spine wound dehiscence --Pt was discharged to SNF rehab with PICC line for vanc and Rocephin til 10/12/23, and wound care. --wound over thoracic spine looks completely healed.  Pt is done with abx.   DM2 --according to [facility] record, pt was on short-acting insulin, but not long-acting insulin. --A1c 6.2 --ACHS and SSI for now   HTN --cont home amlodipine and spironolactone   Hypokalemia --monitor and supplement PRN   DVT prophylaxis: Lovenox SQ Code Status: Full code  Family Communication:  Level of care: Med-Surg Dispo:   The patient is from: SNF rehab Anticipated d/c is to: SNF rehab Anticipated d/c date is: 2-3 days   Subjective and Interval History:  Pt has been agitated and trying to get out of bed.     Objective: Vitals:   10/12/23 1030 10/12/23 1100 10/12/23 1200 10/12/23 1309  BP: (!) 146/71 (!) 149/100 (!) 154/73   Pulse:  86 85   Resp: 20 19 19    Temp:     98.3 F (36.8 C)  TempSrc:    Oral  SpO2:  99% 98%   Weight:      Height:        Intake/Output Summary (Last 24 hours) at 10/12/2023 1514 Last data filed at 10/12/2023 1414 Gross per 24 hour  Intake --  Output 550 ml  Net -550 ml   Filed Weights   10/11/23 1144  Weight: 78 kg    Examination:   Constitutional: NAD, alert, not oriented HEENT: conjunctivae and lids normal, EOMI CV: No cyanosis.   RESP: normal respiratory effort Extremities: edema in BLE SKIN: warm, dry Neuro: II - XII grossly intact.     Data Reviewed: I have personally reviewed labs and imaging studies  Time spent: 50 minutes  Darlin Priestly, MD [facility] If 7PM-7AM, please contact night-coverage 10/12/2023, 3:14 PM

## 2023-10-12 NOTE — ED Notes (Signed)
Pt yelling out for help. This RN went in to check on pt. Pt is sitting up in bed, alert. Pt states that she does not know why she is here and she needs to go home. This RN explained to pt that she was here due to breathing issues. Pt continues to yell out.

## 2023-10-12 NOTE — ED Notes (Signed)
Patient noted to be saturated in urine and stool present. Patient cleaned and new brief, pad and purwick placed

## 2023-10-12 NOTE — ED Notes (Signed)
Patient agitated and pulling off cords and lots of movement in bed. Purwick became displaced and did not catch urine. Patient also had BM. Patient cleaned up and new brief, pad and purwick placed.

## 2023-10-12 NOTE — Progress Notes (Signed)
*  PRELIMINARY RESULTS* Echocardiogram 2D Echocardiogram has been performed.  Carolyne Fiscal 10/12/2023, 2:18 PM

## 2023-10-13 DIAGNOSIS — R0902 Hypoxemia: Secondary | ICD-10-CM | POA: Diagnosis not present

## 2023-10-13 LAB — CBC
HCT: 37 % (ref 36.0–46.0)
Hemoglobin: 11.3 g/dL — ABNORMAL LOW (ref 12.0–15.0)
MCH: 26.4 pg (ref 26.0–34.0)
MCHC: 30.5 g/dL (ref 30.0–36.0)
MCV: 86.4 fL (ref 80.0–100.0)
Platelets: 197 10*3/uL (ref 150–400)
RBC: 4.28 MIL/uL (ref 3.87–5.11)
RDW: 14.5 % (ref 11.5–15.5)
WBC: 4.6 10*3/uL (ref 4.0–10.5)
nRBC: 0 % (ref 0.0–0.2)

## 2023-10-13 LAB — BASIC METABOLIC PANEL
Anion gap: 16 — ABNORMAL HIGH (ref 5–15)
BUN: 10 mg/dL (ref 8–23)
CO2: 34 mmol/L — ABNORMAL HIGH (ref 22–32)
Calcium: 8.9 mg/dL (ref 8.9–10.3)
Chloride: 93 mmol/L — ABNORMAL LOW (ref 98–111)
Creatinine, Ser: 0.4 mg/dL — ABNORMAL LOW (ref 0.44–1.00)
GFR, Estimated: >60 mL/min (ref >60)
Glucose, Bld: 169 mg/dL — ABNORMAL HIGH (ref 70–99)
Potassium: 3.3 mmol/L — ABNORMAL LOW (ref 3.5–5.1)
Sodium: 143 mmol/L (ref 135–145)

## 2023-10-13 LAB — GLUCOSE, CAPILLARY
Glucose-Capillary: 226 mg/dL — ABNORMAL HIGH (ref 70–99)
Glucose-Capillary: 166 mg/dL — ABNORMAL HIGH (ref 70–99)
Glucose-Capillary: 173 mg/dL — ABNORMAL HIGH (ref 70–99)
Glucose-Capillary: 190 mg/dL — ABNORMAL HIGH (ref 70–99)

## 2023-10-13 LAB — MAGNESIUM: Magnesium: 2 mg/dL (ref 1.7–2.4)

## 2023-10-13 MED ORDER — CHLORHEXIDINE GLUCONATE CLOTH 2 % EX PADS
6.0000 | MEDICATED_PAD | Freq: Every day | CUTANEOUS | Status: DC
Start: 2023-10-13 — End: 2023-10-26
  Administered 2023-10-13 – 2023-10-26 (×13): 6 via TOPICAL

## 2023-10-13 MED ORDER — SERTRALINE HCL 50 MG PO TABS
25.0000 mg | ORAL_TABLET | Freq: Every day | ORAL | Status: DC
  Filled 2023-10-13: qty 1

## 2023-10-13 MED ORDER — GABAPENTIN 300 MG PO CAPS
300.0000 mg | ORAL_CAPSULE | Freq: Three times a day (TID) | ORAL | Status: DC
  Administered 2023-10-13: 300 mg via ORAL
  Filled 2023-10-13 (×2): qty 1

## 2023-10-13 MED ORDER — OLANZAPINE 10 MG IM SOLR: 5.0000 mg | Freq: Two times a day (BID) | INTRAMUSCULAR | Status: DC | PRN

## 2023-10-13 MED ORDER — OLANZAPINE 10 MG IM SOLR
5.0000 mg | Freq: Once | INTRAMUSCULAR | Status: AC | PRN
  Administered 2023-10-13: 5 mg via INTRAMUSCULAR
  Filled 2023-10-13: qty 10

## 2023-10-13 MED ORDER — TAMSULOSIN HCL 0.4 MG PO CAPS
0.4000 mg | ORAL_CAPSULE | Freq: Every day | ORAL | Status: DC
  Administered 2023-10-13: 0.4 mg via ORAL
  Filled 2023-10-13: qty 1

## 2023-10-13 MED ORDER — ATENOLOL 25 MG PO TABS
100.0000 mg | ORAL_TABLET | Freq: Every day | ORAL | Status: DC
  Administered 2023-10-13 – 2023-10-16 (×3): 100 mg via ORAL
  Filled 2023-10-13 (×4): qty 1

## 2023-10-13 MED ORDER — POTASSIUM CHLORIDE CRYS ER 20 MEQ PO TBCR: 40.0000 meq | EXTENDED_RELEASE_TABLET | Freq: Once | ORAL | Status: DC

## 2023-10-13 MED ORDER — DONEPEZIL HCL 5 MG PO TABS
5.0000 mg | ORAL_TABLET | Freq: Every day | ORAL | Status: DC
  Administered 2023-10-13: 5 mg via ORAL
  Filled 2023-10-13: qty 1

## 2023-10-13 MED ORDER — QUETIAPINE FUMARATE 25 MG PO TABS
100.0000 mg | ORAL_TABLET | Freq: Every day | ORAL | Status: DC
  Administered 2023-10-13 – 2023-10-16 (×4): 100 mg via ORAL
  Filled 2023-10-13 (×4): qty 4

## 2023-10-13 MED ORDER — OLANZAPINE 10 MG IM SOLR
5.0000 mg | Freq: Three times a day (TID) | INTRAMUSCULAR | Status: DC | PRN
  Administered 2023-10-13: 5 mg via INTRAMUSCULAR
  Filled 2023-10-13 (×2): qty 10

## 2023-10-13 NOTE — Progress Notes (Signed)
PROGRESS NOTE    Tami Cummings  [id number] DOB: [birth year] DOA: 10/11/2023 PCP: Larae Grooms, NP  [id number]/[id number]-AA  LOS: 2 days   Brief hospital course:   Assessment & Plan: Tami Cummings is a 77 y.o. female with medical history significant of DM2, dementia who presented from [facility] for concern for possible PNA.    Acute hypoxemic respiratory failure --Desat to 88% on room air, new O2 requirement of 4L.  CXR showed bilateral pleural effusions with vascular congestion and trace edema, which is likely the cause of pt's respiratory failure.  No fever, cough, leukocytosis to suggest PNA, and pt has been on vanc and ceftriaxone PTA. Plan: --Continue supplemental O2 to keep sats >=92%, wean as tolerated --cont diuresis --hold abx   Acute dCHF Moderate pulm HTN --BNP 314.  CXR showed "Bilateral pleural effusions with the opacities. Enlarged heart with vascular congestion and trace edema".  No known prior hx of CHF.  Current Echo showed LVEF 55 to 60% with grade IIDD Plan: --cont IV lasix 40 BID   Hyperactive hospital delirium Baseline dementia --getting agitated and trying to get out of bed  --IV haldol did not work well, per RN Plan: --IM Zyprexa PRN --increase seroquel 100 mg nightly (new)  Recent thoracic spine wound dehiscence --Pt was discharged to SNF rehab with PICC line for vanc and Rocephin til 10/12/23, and wound care. --wound over thoracic spine looks completely healed.  Pt is done with abx.   DM2 --according to [facility] record, pt was on short-acting insulin, but not long-acting insulin. --A1c 6.2 --SSI for now   HTN --d/c home amlodipine per pt request --cont spironolactone --resume atenolol   Hypokalemia --monitor and supplement PRN   DVT prophylaxis: Lovenox SQ Code Status: Full code  Family Communication: daughter updated at bedside today Level of care: Med-Surg Dispo:   The patient is from: SNF rehab Anticipated d/c is  to: SNF rehab Anticipated d/c date is: 2-3 days   Subjective and Interval History:  Pt continued to be confused, agitated, trying to get out of bed.  Haldol and Zyprexa only worked for a few hours at a time.   Objective: Vitals:   10/13/23 0738 10/13/23 1019 10/13/23 1700 10/13/23 1712  BP: (!) 162/77 (!) 159/85  (!) 155/90  Pulse: 94 (!) 110  (!) 106  Resp: 20 20  20   Temp: 99 F (37.2 C) 97.9 F (36.6 C)  98.9 F (37.2 C)  TempSrc: Oral Oral    SpO2: 99% 92% (!) 78% 96%  Weight:      Height:        Intake/Output Summary (Last 24 hours) at 10/13/2023 1734 Last data filed at 10/13/2023 1534 Gross per 24 hour  Intake 60 ml  Output --  Net 60 ml   Filed Weights   10/11/23 1144  Weight: 78 kg    Examination:   Constitutional: NAD, confused CV: No cyanosis.   RESP: normal respiratory effort, on 5L Extremities: edema in BLE improved SKIN: warm, dry   Data Reviewed: I have personally reviewed labs and imaging studies  Time spent: 50 minutes  Darlin Priestly, MD [facility] If 7PM-7AM, please contact night-coverage 10/13/2023, 5:34 PM

## 2023-10-13 NOTE — Progress Notes (Signed)
Puriwick trial with patient (per family request) after several incont episodes without success. Pt removes puriwick and throws it across room.

## 2023-10-13 NOTE — Progress Notes (Signed)
Patient continues to remove nasal cannula, attempt to pull out lines, and exit bed. Bilat mitts placed for safety. Mitts are NOT secure to any sort of restraint system. Patient still able to freely move arms and legs.

## 2023-10-13 NOTE — Progress Notes (Signed)
Haldol was given this morning with little effect. Family is requesting for something for agitation. Pt is agitated currently; however some of it may be due, in part, to family interaction. MD notified and new orders obtained.

## 2023-10-13 NOTE — Plan of Care (Signed)
  Problem: Education: Goal: Ability to describe self-care measures that may prevent or decrease complications (Diabetes Survival Skills Education) will improve Outcome: Progressing   Problem: Coping: Goal: Ability to adjust to condition or change in health will improve Outcome: Progressing   Problem: Metabolic: Goal: Ability to maintain appropriate glucose levels will improve Outcome: Progressing   Problem: Nutritional: Goal: Maintenance of adequate nutrition will improve Outcome: Progressing   Problem: Skin Integrity: Goal: Risk for impaired skin integrity will decrease Outcome: Progressing   Problem: Tissue Perfusion: Goal: Adequacy of tissue perfusion will improve Outcome: Progressing   Problem: Activity: Goal: Risk for activity intolerance will decrease Outcome: Progressing   Problem: Nutrition: Goal: Adequate nutrition will be maintained Outcome: Progressing   Problem: Coping: Goal: Level of anxiety will decrease Outcome: Progressing   Problem: Safety: Goal: Ability to remain free from injury will improve Outcome: Progressing   Problem: Skin Integrity: Goal: Risk for impaired skin integrity will decrease Outcome: Progressing

## 2023-10-13 NOTE — Evaluation (Signed)
Occupational Therapy Evaluation Patient Details Name: Tami Cummings MRN: [id number] DOB: [birth year] Today's Date: 10/13/2023   History of Present Illness 77 y.o. female with medical history significant of DM2, dementia who presented from [facility] for concern for possible PNA.   Clinical Impression   Upon entering the room, pt supine in bed but agreeable to OT intervention. Pt is oriented to self only and unable to answer orientation questions correctly. Pt keeps eyes closed mostly if laying in bed. She is agreeable to attempt EOB which she performs with mod A. Pt's static sitting balance for several minutes is supervision. Pt declines standing/transfer attempts and returns self to supine with min A. Pt reporting she ambulates at baseline but is poor historian. Per chart review, pt is from [facility] re[facility]. OT called to speak to staff who report pt is a LTC resident and is now non ambulatory at baseline. She is a 1 person staff assist to stand pivot to wheelchair and needs "a lot of assistance for self care from bed". Staff also reports she is normally oriented to self only and generally confused secondary to dementia. Pt appears at baseline level for functional tasks. OT to complete orders at this time.          Functional Status Assessment  Patient has not had a recent decline in their functional status  Equipment Recommendations  None recommended by OT       Precautions / Restrictions Precautions Precautions: Fall      Mobility Bed Mobility Overal bed mobility: Needs Assistance Bed Mobility: Supine to Sit, Sit to Supine     Supine to sit: Mod assist Sit to supine: Min assist        Transfers                   General transfer comment: Pt refusal          ADL either performed or assessed with clinical judgement   ADL Overall ADL's : At baseline                                             Vision Patient Visual  Report: No change from baseline              Pertinent Vitals/Pain Pain Assessment Pain Assessment: No/denies pain     Extremity/Trunk Assessment Upper Extremity Assessment Upper Extremity Assessment: Overall WFL for tasks assessed           Communication     Cognition Arousal: Lethargic Behavior During Therapy: Flat affect Overall Cognitive Status: History of cognitive impairments - at baseline                                 General Comments: Pt is oriented to self only                Home Living Family/patient expects to be discharged to:: Skilled nursing facility                                        Prior Functioning/Environment Prior Level of Function : Needs assist             Mobility Comments: Pt does not ambulate  at baseline per staff report ADLs Comments: Pt is LTC resident of [facility]. OT called and spoke with staff who report pt has baseline confusion and is staff assist for stand pivot to wheelchair. Pt needs heavy assistance for self care needs from bed level per staff report.         AM-PAC OT "6 Clicks" Daily Activity     Outcome Measure Help from another person eating meals?: A Little Help from another person taking care of personal grooming?: A Little Help from another person toileting, which includes using toliet, bedpan, or urinal?: A Lot Help from another person bathing (including washing, rinsing, drying)?: A Lot Help from another person to put on and taking off regular upper body clothing?: A Lot Help from another person to put on and taking off regular lower body clothing?: A Lot 6 Click Score: 14   End of Session Nurse Communication: Mobility status;Other (comment) (PLOF)  Activity Tolerance: Patient tolerated treatment well Patient left: in bed;with call bell/phone within reach;with bed alarm set                   Time: 9147-8295 OT Time Calculation (min): 20 min Charges:  OT  General Charges $OT Visit: 1 Visit OT Evaluation $OT Eval Moderate Complexity: 1 Mod OT Treatments $Therapeutic Activity: 8-22 mins  Jackquline Denmark, MS, OTR/L , CBIS ascom [phone]  10/13/23, 12:02 PM
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
## 2023-10-14 DIAGNOSIS — R0902 Hypoxemia: Secondary | ICD-10-CM | POA: Diagnosis not present

## 2023-10-14 LAB — GLUCOSE, CAPILLARY
Glucose-Capillary: 121 mg/dL — ABNORMAL HIGH (ref 70–99)
Glucose-Capillary: 103 mg/dL — ABNORMAL HIGH (ref 70–99)
Glucose-Capillary: 190 mg/dL — ABNORMAL HIGH (ref 70–99)
Glucose-Capillary: 178 mg/dL — ABNORMAL HIGH (ref 70–99)

## 2023-10-14 LAB — CBC
HCT: 34.1 % — ABNORMAL LOW (ref 36.0–46.0)
Hemoglobin: 10.2 g/dL — ABNORMAL LOW (ref 12.0–15.0)
MCH: 26.4 pg (ref 26.0–34.0)
MCHC: 29.9 g/dL — ABNORMAL LOW (ref 30.0–36.0)
MCV: 88.1 fL (ref 80.0–100.0)
Platelets: 177 10*3/uL (ref 150–400)
RBC: 3.87 MIL/uL (ref 3.87–5.11)
RDW: 14.5 % (ref 11.5–15.5)
WBC: 5.2 10*3/uL (ref 4.0–10.5)
nRBC: 0 % (ref 0.0–0.2)

## 2023-10-14 LAB — BASIC METABOLIC PANEL
Anion gap: 8 (ref 5–15)
BUN: 12 mg/dL (ref 8–23)
CO2: 40 mmol/L — ABNORMAL HIGH (ref 22–32)
Calcium: 8.6 mg/dL — ABNORMAL LOW (ref 8.9–10.3)
Chloride: 95 mmol/L — ABNORMAL LOW (ref 98–111)
Creatinine, Ser: 0.33 mg/dL — ABNORMAL LOW (ref 0.44–1.00)
GFR, Estimated: >60 mL/min (ref >60)
Glucose, Bld: 120 mg/dL — ABNORMAL HIGH (ref 70–99)
Potassium: 2.9 mmol/L — ABNORMAL LOW (ref 3.5–5.1)
Sodium: 143 mmol/L (ref 135–145)

## 2023-10-14 LAB — MAGNESIUM: Magnesium: 2.1 mg/dL (ref 1.7–2.4)

## 2023-10-14 MED ORDER — POTASSIUM CHLORIDE CRYS ER 20 MEQ PO TBCR
40.0000 meq | EXTENDED_RELEASE_TABLET | ORAL | Status: DC
  Filled 2023-10-14: qty 2

## 2023-10-14 MED ORDER — POTASSIUM CHLORIDE 20 MEQ PO PACK
40.0000 meq | PACK | ORAL | Status: AC
  Administered 2023-10-14 (×2): 40 meq via ORAL
  Filled 2023-10-14 (×2): qty 2

## 2023-10-14 NOTE — Progress Notes (Signed)
PROGRESS NOTE    Tami Cummings  YQM:[id number] DOB: [birth year] DOA: 10/11/2023 PCP: Larae Grooms, NP  124A/124A-AA  LOS: 3 days   Brief hospital course:   Assessment & Plan: Tami Cummings is a 77 y.o. female with medical history significant of DM2, dementia who presented from [facility] for concern for possible PNA.    Acute hypoxemic respiratory failure --Desat to 88% on room air, new O2 requirement of 4L on presentation.  CXR showed bilateral pleural effusions with vascular congestion and trace edema, which is likely the cause of pt's respiratory failure.  No fever, cough, leukocytosis to suggest PNA, and pt has been on vanc and ceftriaxone PTA. Plan: --Continue supplemental O2 to keep sats >=92%, wean as tolerated --cont diuresis --hold abx   Acute dCHF Moderate pulm HTN --BNP 314.  CXR showed "Bilateral pleural effusions with the opacities. Enlarged heart with vascular congestion and trace edema".  No known prior hx of CHF.  Current Echo showed LVEF 55 to 60% with grade IIDD --started on IV lasix 40 BID Plan: --hold diuresis today since potassium low and pt had refused supplementation.   Hyperactive hospital delirium Baseline dementia --getting agitated and trying to get out of bed  --IV haldol did not work well, per RN Plan: --IM Zyprexa PRN --cont seroquel 100 mg nightly (new)  Recent thoracic spine wound dehiscence --Pt was discharged to SNF rehab with PICC line for vanc and Rocephin til 10/12/23, and wound care. --wound over thoracic spine looks completely healed.  Pt is done with abx.   DM2 --according to [facility] record, pt was on short-acting insulin, but not long-acting insulin. --A1c 6.2 --SSI   HTN --d/c'ed home amlodipine per pt request --cont spironolactone and atenolol   Hypokalemia --monitor and supplement PRN --since pt refused meds, potassium powder ordered to be mixed into pt's regular drinks.   DVT prophylaxis:  Lovenox SQ Code Status: Full code  Family Communication:  Level of care: Med-Surg Dispo:   The patient is from: SNF rehab Anticipated d/c is to: SNF rehab Anticipated d/c date is: 2-3 days   Subjective and Interval History:  Pt refused all meds today.  Pt denied dyspnea.   Objective: Vitals:   10/14/23 0528 10/14/23 0833 10/14/23 1430 10/14/23 1507  BP: (!) 141/64 (!) 163/74  (!) 154/72  Pulse: 88 93  78  Resp: 20 15  20   Temp: 98 F (36.7 C) 98.1 F (36.7 C)  98 F (36.7 C)  TempSrc:      SpO2: 100% 100% 98% 96%  Weight:      Height:        Intake/Output Summary (Last 24 hours) at 10/14/2023 1511 Last data filed at 10/14/2023 1105 Gross per 24 hour  Intake 0 ml  Output --  Net 0 ml   Filed Weights   10/11/23 1144  Weight: 78 kg    Examination:   Constitutional: NAD, sleepy but arousable, confused HEENT: conjunctivae and lids normal, EOMI CV: No cyanosis.   RESP: normal respiratory effort, on 5L Extremities: No effusions, edema in BLE SKIN: warm, dry   Data Reviewed: I have personally reviewed labs and imaging studies  Time spent: 50 minutes  Darlin Priestly, MD [facility] If 7PM-7AM, please contact night-coverage 10/14/2023, 3:11 PM

## 2023-10-14 NOTE — Plan of Care (Signed)
  Problem: Coping: Goal: Ability to adjust to condition or change in health will improve Outcome: Progressing   Problem: Fluid Volume: Goal: Ability to maintain a balanced intake and output will improve Outcome: Progressing   Problem: Nutritional: Goal: Maintenance of adequate nutrition will improve Outcome: Progressing   Problem: Skin Integrity: Goal: Risk for impaired skin integrity will decrease Outcome: Progressing   Problem: Activity: Goal: Risk for activity intolerance will decrease Outcome: Progressing   Problem: Coping: Goal: Level of anxiety will decrease Outcome: Progressing   Problem: Elimination: Goal: Will not experience complications related to bowel motility Outcome: Progressing

## 2023-10-14 NOTE — Progress Notes (Signed)
Patient refused all oral medications this morning after three attempts with three nurses. Patient agitated and yelling "I will bite you" and "leave me alone". Dr Fran Lowes notified that patient is refusing to take anything oral at this time. Patient unable to be reoriented

## 2023-10-14 NOTE — Progress Notes (Signed)
PT Cancellation Note  Patient Details Name: Tami Cummings MRN: [id number] DOB: 1946-07-06   Cancelled Treatment:    Reason Eval/Treat Not Completed: Other (comment).  Chart reviewed and attempted to see pt.  Pt lethargic and refusing to work with therapy at this time, just repeating no while keeping eyes closed during questioning.  Upon chart review pt is unable to mobilize and is only able to perform stand pivot transfer to wheelchair.  Pt to return to LTC residency upon discharge from hospital. PT signing off, please re-consult if need arises.    Nolon Bussing, PT, DPT Physical Therapist - [facility]  10/14/23, 12:27 PM

## 2023-10-15 DIAGNOSIS — R0902 Hypoxemia: Secondary | ICD-10-CM | POA: Diagnosis not present

## 2023-10-15 LAB — BASIC METABOLIC PANEL
Anion gap: 12 (ref 5–15)
BUN: 12 mg/dL (ref 8–23)
CO2: 38 mmol/L — ABNORMAL HIGH (ref 22–32)
Calcium: 8.8 mg/dL — ABNORMAL LOW (ref 8.9–10.3)
Chloride: 95 mmol/L — ABNORMAL LOW (ref 98–111)
Creatinine, Ser: 0.36 mg/dL — ABNORMAL LOW (ref 0.44–1.00)
GFR, Estimated: >60 mL/min (ref >60)
Glucose, Bld: 168 mg/dL — ABNORMAL HIGH (ref 70–99)
Potassium: 3.2 mmol/L — ABNORMAL LOW (ref 3.5–5.1)
Sodium: 145 mmol/L (ref 135–145)

## 2023-10-15 LAB — CBC
HCT: 40.9 % (ref 36.0–46.0)
Hemoglobin: 12.1 g/dL (ref 12.0–15.0)
MCH: 25.9 pg — ABNORMAL LOW (ref 26.0–34.0)
MCHC: 29.6 g/dL — ABNORMAL LOW (ref 30.0–36.0)
MCV: 87.6 fL (ref 80.0–100.0)
Platelets: 149 10*3/uL — ABNORMAL LOW (ref 150–400)
RBC: 4.67 MIL/uL (ref 3.87–5.11)
RDW: 14.2 % (ref 11.5–15.5)
WBC: 6.2 10*3/uL (ref 4.0–10.5)
nRBC: 0 % (ref 0.0–0.2)

## 2023-10-15 LAB — GLUCOSE, CAPILLARY
Glucose-Capillary: 161 mg/dL — ABNORMAL HIGH (ref 70–99)
Glucose-Capillary: 182 mg/dL — ABNORMAL HIGH (ref 70–99)
Glucose-Capillary: 142 mg/dL — ABNORMAL HIGH (ref 70–99)
Glucose-Capillary: 128 mg/dL — ABNORMAL HIGH (ref 70–99)

## 2023-10-15 LAB — MAGNESIUM: Magnesium: 2.2 mg/dL (ref 1.7–2.4)

## 2023-10-15 MED ORDER — LOSARTAN POTASSIUM 50 MG PO TABS
50.0000 mg | ORAL_TABLET | Freq: Every day | ORAL | Status: DC
Start: 2023-10-15 — End: 2023-10-16
  Administered 2023-10-15 – 2023-10-16 (×2): 50 mg via ORAL
  Filled 2023-10-15 (×2): qty 1

## 2023-10-15 MED ORDER — ONDANSETRON HCL 4 MG/2ML IJ SOLN
4.0000 mg | Freq: Four times a day (QID) | INTRAMUSCULAR | Status: DC | PRN
  Administered 2023-10-20: 4 mg via INTRAVENOUS
  Filled 2023-10-15: qty 2

## 2023-10-15 MED ORDER — POTASSIUM CHLORIDE CRYS ER 20 MEQ PO TBCR
40.0000 meq | EXTENDED_RELEASE_TABLET | Freq: Once | ORAL | Status: AC
  Administered 2023-10-15: 40 meq via ORAL
  Filled 2023-10-15: qty 2

## 2023-10-15 MED ORDER — FUROSEMIDE 10 MG/ML IJ SOLN
40.0000 mg | Freq: Two times a day (BID) | INTRAMUSCULAR | Status: DC
  Administered 2023-10-15 – 2023-10-16 (×3): 40 mg via INTRAVENOUS
  Filled 2023-10-15 (×3): qty 4

## 2023-10-15 MED ORDER — HYDRALAZINE HCL 20 MG/ML IJ SOLN
10.0000 mg | Freq: Four times a day (QID) | INTRAMUSCULAR | Status: DC | PRN
  Administered 2023-10-15: 10 mg via INTRAVENOUS
  Filled 2023-10-15: qty 1

## 2023-10-15 MED ORDER — ONDANSETRON 4 MG PO TBDP: 4.0000 mg | ORAL_TABLET | Freq: Three times a day (TID) | ORAL | Status: DC | PRN

## 2023-10-15 MED ORDER — SPIRONOLACTONE 25 MG PO TABS
50.0000 mg | ORAL_TABLET | Freq: Every day | ORAL | Status: DC
  Administered 2023-10-16: 50 mg via ORAL
  Filled 2023-10-15: qty 2

## 2023-10-15 NOTE — Progress Notes (Signed)
PROGRESS NOTE    Tami Cummings  ION:[id number] DOB: [birth year] DOA: 10/11/2023 PCP: Larae Grooms, NP  124A/124A-AA  LOS: 4 days   Brief hospital course:   Assessment & Plan: Tami Cummings is a 77 y.o. female with medical history significant of DM2, dementia who presented from [facility] for concern for possible PNA.    Acute hypoxemic respiratory failure --Desat to 88% on room air, new O2 requirement of 4L on presentation.  CXR showed bilateral pleural effusions with vascular congestion and trace edema, which is likely the cause of pt's respiratory failure.  No fever, cough, leukocytosis to suggest PNA, and pt has been on vanc and ceftriaxone PTA. Plan: --Continue supplemental O2 to keep sats >=92%, wean as tolerated --tx with diuresis --hold abx   Acute dCHF Moderate pulm HTN --BNP 314.  CXR showed "Bilateral pleural effusions with the opacities. Enlarged heart with vascular congestion and trace edema".  No known prior hx of CHF.  Current Echo showed LVEF 55 to 60% with grade IIDD --started on IV lasix 40 BID Plan: --resume IV lasix 40 BID today   Hyperactive hospital delirium Baseline dementia --getting agitated and trying to get out of bed  --IV haldol did not work well, per RN Plan: --IM Zyprexa PRN --cont seroquel 100 mg nightly (new)  Recent thoracic spine wound dehiscence --Pt was discharged to SNF rehab with PICC line for vanc and Rocephin til 10/12/23, and wound care. --wound over thoracic spine looks completely healed.  Pt is done with abx.   DM2 --according to [facility] record, pt was on short-acting insulin, but not long-acting insulin. --A1c 6.2 --SSI   HTN --d/c'ed home amlodipine per pt request --BP uncontrolled --increase spironolactone to 50 mg daily --add losartan 50 mg daily --cont atenolol   Hypokalemia --monitor and supplement PRN.  Pt sometimes refuses meds.  Excess secretion in upper airway --RT consulted, performed  NTS with good effect.   DVT prophylaxis: Lovenox SQ Code Status: Full code  Family Communication:  Level of care: Med-Surg Dispo:   The patient is from: SNF rehab Anticipated d/c is to: SNF rehab Anticipated d/c date is: 2-3 days   Subjective and Interval History:  Pt had excess secretion in her upper airways today.  RT suctioned with improvement.   Objective: Vitals:   10/14/23 2127 10/15/23 0500 10/15/23 0805 10/15/23 1545  BP: (!) 142/58 (!) 166/71 (!) 182/80 (!) 188/88  Pulse: 72 82 81 71  Resp:   16 (!) 22  Temp:  98.2 F (36.8 C) 98.1 F (36.7 C) 98.2 F (36.8 C)  TempSrc:  Oral  Oral  SpO2:  98% 91% 94%  Weight:      Height:        Intake/Output Summary (Last 24 hours) at 10/15/2023 1824 Last data filed at 10/15/2023 1413 Gross per 24 hour  Intake 0 ml  Output --  Net 0 ml   Filed Weights   10/11/23 1144  Weight: 78 kg    Examination:   Constitutional: NAD, alert, not oriented HEENT: conjunctivae and lids normal, EOMI CV: No cyanosis.   RESP: gurgling in upper airway, on 4L Extremities: No pitting edema in BLE SKIN: warm, dry   Data Reviewed: I have personally reviewed labs and imaging studies  Time spent: 50 minutes  Darlin Priestly, MD [facility] If 7PM-7AM, please contact night-coverage 10/15/2023, 6:24 PM

## 2023-10-15 NOTE — Progress Notes (Signed)
Pt's breath sounds rhonchi bilaterally. Pt NT suctioned, tolerated well with the help of RN. Suctioned moderate amount of frothy tan secreatons. Breath sounds mostly clear post suctioning.

## 2023-10-16 ENCOUNTER — Other Ambulatory Visit: Payer: Self-pay

## 2023-10-16 ENCOUNTER — Inpatient Hospital Stay: Payer: 59

## 2023-10-16 DIAGNOSIS — R0902 Hypoxemia: Secondary | ICD-10-CM | POA: Diagnosis not present

## 2023-10-16 LAB — COMPREHENSIVE METABOLIC PANEL
ALT: 15 U/L (ref 0–44)
AST: 20 U/L (ref 15–41)
Albumin: 3.4 g/dL — ABNORMAL LOW (ref 3.5–5.0)
Alkaline Phosphatase: 83 U/L (ref 38–126)
Anion gap: 12 (ref 5–15)
BUN: 18 mg/dL (ref 8–23)
CO2: 41 mmol/L — ABNORMAL HIGH (ref 22–32)
Calcium: 8.9 mg/dL (ref 8.9–10.3)
Chloride: 92 mmol/L — ABNORMAL LOW (ref 98–111)
Creatinine, Ser: 0.6 mg/dL (ref 0.44–1.00)
GFR, Estimated: >60 mL/min (ref >60)
Glucose, Bld: 159 mg/dL — ABNORMAL HIGH (ref 70–99)
Potassium: 3.3 mmol/L — ABNORMAL LOW (ref 3.5–5.1)
Sodium: 145 mmol/L (ref 135–145)
Total Bilirubin: 0.9 mg/dL (ref 0.3–1.2)
Total Protein: 6.8 g/dL (ref 6.5–8.1)

## 2023-10-16 LAB — CULTURE, BLOOD (ROUTINE X 2): Culture: NO GROWTH

## 2023-10-16 LAB — GLUCOSE, CAPILLARY
Glucose-Capillary: 168 mg/dL — ABNORMAL HIGH (ref 70–99)
Glucose-Capillary: 136 mg/dL — ABNORMAL HIGH (ref 70–99)
Glucose-Capillary: 160 mg/dL — ABNORMAL HIGH (ref 70–99)
Glucose-Capillary: 193 mg/dL — ABNORMAL HIGH (ref 70–99)
Glucose-Capillary: 154 mg/dL — ABNORMAL HIGH (ref 70–99)

## 2023-10-16 LAB — URINALYSIS, COMPLETE (UACMP) WITH MICROSCOPIC
Bilirubin Urine: NEGATIVE
Glucose, UA: NEGATIVE mg/dL
Ketones, ur: 5 mg/dL — AB
Nitrite: NEGATIVE
Protein, ur: 100 mg/dL — AB
Specific Gravity, Urine: 1.009 (ref 1.005–1.030)
pH: 5 (ref 5.0–8.0)

## 2023-10-16 LAB — BASIC METABOLIC PANEL
Anion gap: 13 (ref 5–15)
BUN: 15 mg/dL (ref 8–23)
CO2: 40 mmol/L — ABNORMAL HIGH (ref 22–32)
Calcium: 8.6 mg/dL — ABNORMAL LOW (ref 8.9–10.3)
Chloride: 90 mmol/L — ABNORMAL LOW (ref 98–111)
Creatinine, Ser: 0.56 mg/dL (ref 0.44–1.00)
GFR, Estimated: >60 mL/min (ref >60)
Glucose, Bld: 136 mg/dL — ABNORMAL HIGH (ref 70–99)
Potassium: 3.2 mmol/L — ABNORMAL LOW (ref 3.5–5.1)
Sodium: 143 mmol/L (ref 135–145)

## 2023-10-16 LAB — BLOOD GAS, ARTERIAL
Acid-Base Excess: 15.9 mmol/L — ABNORMAL HIGH (ref 0.0–2.0)
Bicarbonate: 45 mmol/L — ABNORMAL HIGH (ref 20.0–28.0)
FIO2: 100 %
MECHVT: 400 mL
O2 Saturation: 80.8 %
Patient temperature: 37
RATE: 12 {breaths}/min
pCO2 arterial: 76 mm[Hg] (ref 32–48)
pH, Arterial: 7.38 (ref 7.35–7.45)
pO2, Arterial: 49 mm[Hg] — ABNORMAL LOW (ref 83–108)

## 2023-10-16 LAB — CBC
HCT: 44.7 % (ref 36.0–46.0)
Hemoglobin: 12.9 g/dL (ref 12.0–15.0)
MCH: 25.4 pg — ABNORMAL LOW (ref 26.0–34.0)
MCHC: 28.9 g/dL — ABNORMAL LOW (ref 30.0–36.0)
MCV: 88.2 fL (ref 80.0–100.0)
Platelets: 152 10*3/uL (ref 150–400)
RBC: 5.07 MIL/uL (ref 3.87–5.11)
RDW: 14.4 % (ref 11.5–15.5)
WBC: 7.5 10*3/uL (ref 4.0–10.5)
nRBC: 0 % (ref 0.0–0.2)

## 2023-10-16 LAB — TROPONIN I (HIGH SENSITIVITY)
Troponin I (High Sensitivity): 46 ng/L — ABNORMAL HIGH (ref <18)
Troponin I (High Sensitivity): 49 ng/L — ABNORMAL HIGH (ref <18)

## 2023-10-16 LAB — POTASSIUM: Potassium: 3.8 mmol/L (ref 3.5–5.1)

## 2023-10-16 LAB — MAGNESIUM: Magnesium: 2 mg/dL (ref 1.7–2.4)

## 2023-10-16 LAB — LACTIC ACID, PLASMA
Lactic Acid, Venous: 2.8 mmol/L (ref 0.5–1.9)
Lactic Acid, Venous: 1.2 mmol/L (ref 0.5–1.9)

## 2023-10-16 LAB — PROCALCITONIN: Procalcitonin: 0.45 ng/mL

## 2023-10-16 LAB — APTT: aPTT: 27 s (ref 24–36)

## 2023-10-16 LAB — PHOSPHORUS: Phosphorus: 5.1 mg/dL — ABNORMAL HIGH (ref 2.5–4.6)

## 2023-10-16 LAB — PROTIME-INR
INR: 1.1 (ref 0.8–1.2)
Prothrombin Time: 14.1 s (ref 11.4–15.2)

## 2023-10-16 MED ORDER — DOCUSATE SODIUM 50 MG/5ML PO LIQD
100.0000 mg | Freq: Two times a day (BID) | ORAL | Status: DC
  Administered 2023-10-16 – 2023-10-19 (×6): 100 mg
  Filled 2023-10-16 (×7): qty 10

## 2023-10-16 MED ORDER — MIDAZOLAM HCL 2 MG/2ML IJ SOLN
1.0000 mg | INTRAMUSCULAR | Status: DC | PRN
  Administered 2023-10-16: 2 mg via INTRAVENOUS
  Filled 2023-10-16: qty 2

## 2023-10-16 MED ORDER — ORAL CARE MOUTH RINSE
15.0000 mL | OROMUCOSAL | Status: DC | PRN
  Administered 2023-10-22: 15 mL via OROMUCOSAL

## 2023-10-16 MED ORDER — FENTANYL CITRATE PF 50 MCG/ML IJ SOSY: 25.0000 ug | PREFILLED_SYRINGE | Freq: Once | INTRAMUSCULAR | Status: DC

## 2023-10-16 MED ORDER — HEPARIN (PORCINE) 25000 UT/250ML-% IV SOLN
1050.0000 [IU]/h | INTRAVENOUS | Status: DC
  Administered 2023-10-16: 1050 [IU]/h via INTRAVENOUS
  Filled 2023-10-16: qty 250

## 2023-10-16 MED ORDER — FENTANYL 2500MCG IN NS 250ML (10MCG/ML) PREMIX INFUSION
25.0000 ug/h | INTRAVENOUS | Status: DC
Start: 2023-10-16 — End: 2023-10-17
  Administered 2023-10-16: 25 ug/h via INTRAVENOUS

## 2023-10-16 MED ORDER — POLYETHYLENE GLYCOL 3350 17 G PO PACK
17.0000 g | PACK | Freq: Every day | ORAL | Status: DC
  Administered 2023-10-17 – 2023-10-19 (×3): 17 g
  Filled 2023-10-16 (×3): qty 1

## 2023-10-16 MED ORDER — FENTANYL CITRATE PF 50 MCG/ML IJ SOSY
50.0000 ug | PREFILLED_SYRINGE | Freq: Once | INTRAMUSCULAR | Status: AC
  Administered 2023-10-16: 50 ug via INTRAVENOUS
  Filled 2023-10-16: qty 1

## 2023-10-16 MED ORDER — FAMOTIDINE 20 MG PO TABS
20.0000 mg | ORAL_TABLET | Freq: Two times a day (BID) | ORAL | Status: DC
Start: 2023-10-16 — End: 2023-10-17
  Administered 2023-10-16 – 2023-10-17 (×2): 20 mg
  Filled 2023-10-16 (×3): qty 1

## 2023-10-16 MED ORDER — SODIUM CHLORIDE 0.9 % IV SOLN
3.0000 g | Freq: Four times a day (QID) | INTRAVENOUS | Status: DC
  Administered 2023-10-16 – 2023-10-17 (×4): 3 g via INTRAVENOUS
  Filled 2023-10-16 (×6): qty 8

## 2023-10-16 MED ORDER — LACTATED RINGERS IV BOLUS
500.0000 mL | Freq: Once | INTRAVENOUS | Status: AC
  Administered 2023-10-16: 500 mL via INTRAVENOUS

## 2023-10-16 MED ORDER — IPRATROPIUM-ALBUTEROL 0.5-2.5 (3) MG/3ML IN SOLN
3.0000 mL | RESPIRATORY_TRACT | Status: DC
  Administered 2023-10-16 – 2023-10-17 (×6): 3 mL via RESPIRATORY_TRACT
  Filled 2023-10-16 (×6): qty 3

## 2023-10-16 MED ORDER — ETOMIDATE 2 MG/ML IV SOLN
20.0000 mg | Freq: Once | INTRAVENOUS | Status: AC
  Administered 2023-10-16: 20 mg via INTRAVENOUS
  Filled 2023-10-16: qty 10

## 2023-10-16 MED ORDER — NOREPINEPHRINE 4 MG/250ML-% IV SOLN
INTRAVENOUS | Status: AC
  Administered 2023-10-16: 4 mg via INTRAVENOUS
  Filled 2023-10-16: qty 250

## 2023-10-16 MED ORDER — SODIUM CHLORIDE 0.9% FLUSH
10.0000 mL | Freq: Two times a day (BID) | INTRAVENOUS | Status: DC
  Administered 2023-10-16 – 2023-10-21 (×11): 10 mL
  Administered 2023-10-22: 20 mL

## 2023-10-16 MED ORDER — ROCURONIUM BROMIDE 10 MG/ML (PF) SYRINGE
50.0000 mg | PREFILLED_SYRINGE | Freq: Once | INTRAVENOUS | Status: AC
  Administered 2023-10-16: 50 mg via INTRAVENOUS
  Filled 2023-10-16: qty 10

## 2023-10-16 MED ORDER — ORAL CARE MOUTH RINSE
15.0000 mL | OROMUCOSAL | Status: DC
  Administered 2023-10-16 – 2023-10-19 (×32): 15 mL via OROMUCOSAL

## 2023-10-16 MED ORDER — ATROPINE SULFATE 1 MG/10ML IJ SOSY: 1.0000 mg | PREFILLED_SYRINGE | INTRAMUSCULAR | Status: DC | PRN

## 2023-10-16 MED ORDER — FENTANYL 2500MCG IN NS 250ML (10MCG/ML) PREMIX INFUSION
25.0000 ug/h | INTRAVENOUS | Status: DC
Start: 2023-10-16 — End: 2023-10-16
  Filled 2023-10-16: qty 250

## 2023-10-16 MED ORDER — BUDESONIDE 0.5 MG/2ML IN SUSP
0.5000 mg | Freq: Two times a day (BID) | RESPIRATORY_TRACT | Status: DC
  Administered 2023-10-16 – 2023-10-18 (×5): 0.5 mg via RESPIRATORY_TRACT
  Filled 2023-10-16 (×5): qty 2

## 2023-10-16 MED ORDER — METHYLPREDNISOLONE SODIUM SUCC 40 MG IJ SOLR
20.0000 mg | Freq: Two times a day (BID) | INTRAMUSCULAR | Status: DC
  Administered 2023-10-16 (×2): 20 mg via INTRAVENOUS
  Filled 2023-10-16 (×2): qty 1

## 2023-10-16 MED ORDER — VASOPRESSIN 20 UNITS/100 ML INFUSION FOR SHOCK
0.0000 [IU]/min | INTRAVENOUS | Status: DC
  Administered 2023-10-16 – 2023-10-17 (×2): 0.04 [IU]/min via INTRAVENOUS
  Filled 2023-10-16 (×2): qty 100

## 2023-10-16 MED ORDER — INSULIN ASPART 100 UNIT/ML IJ SOLN
0.0000 [IU] | INTRAMUSCULAR | Status: DC
  Administered 2023-10-16 – 2023-10-17 (×5): 3 [IU] via SUBCUTANEOUS
  Administered 2023-10-17 (×2): 2 [IU] via SUBCUTANEOUS
  Administered 2023-10-18: 8 [IU] via SUBCUTANEOUS
  Administered 2023-10-18: 3 [IU] via SUBCUTANEOUS
  Administered 2023-10-18: 8 [IU] via SUBCUTANEOUS
  Administered 2023-10-18 (×2): 2 [IU] via SUBCUTANEOUS
  Filled 2023-10-16 (×11): qty 1

## 2023-10-16 MED ORDER — FENTANYL BOLUS VIA INFUSION
25.0000 ug | INTRAVENOUS | Status: DC | PRN
  Administered 2023-10-17: 50 ug via INTRAVENOUS
  Filled 2023-10-16: qty 100

## 2023-10-16 MED ORDER — QUETIAPINE FUMARATE 25 MG PO TABS
100.0000 mg | ORAL_TABLET | Freq: Every day | ORAL | Status: DC
  Administered 2023-10-17: 100 mg
  Filled 2023-10-16: qty 4

## 2023-10-16 MED ORDER — HEPARIN BOLUS VIA INFUSION
4000.0000 [IU] | Freq: Once | INTRAVENOUS | Status: AC
  Administered 2023-10-16: 4000 [IU] via INTRAVENOUS
  Filled 2023-10-16: qty 4000

## 2023-10-16 MED ORDER — SODIUM CHLORIDE 0.9% FLUSH: 10.0000 mL | INTRAVENOUS | Status: DC | PRN

## 2023-10-16 MED ORDER — NOREPINEPHRINE 16 MG/250ML-% IV SOLN
0.0000 ug/min | INTRAVENOUS | Status: DC
  Administered 2023-10-16: 5 ug/min via INTRAVENOUS
  Filled 2023-10-16: qty 250

## 2023-10-16 MED ORDER — LACTATED RINGERS IV BOLUS
1000.0000 mL | Freq: Once | INTRAVENOUS | Status: AC
  Administered 2023-10-16: 1000 mL via INTRAVENOUS

## 2023-10-16 NOTE — Consult Note (Signed)
PHARMACY - ANTICOAGULATION CONSULT NOTE  Pharmacy Consult for heparin infusion Indication: pulmonary embolus  Allergies  Allergen Reactions   Empagliflozin    Actos [Pioglitazone] Other (See Comments)    CHF   Amlodipine Swelling    Leg swelling    Benazepril Other (See Comments)    angioedema   Hydrochlorothiazide Other (See Comments)    angioedema   Metformin And Related Diarrhea    Patient Measurements: Height: 5\' 1"  (154.9 cm) Weight: 78 kg (171 lb 15.3 oz) IBW/kg (Calculated) : 47.8 Heparin Dosing Weight: 65.2 kg  Vital Signs: Temp: 98.8 F (37.1 C) (10/29 0901) Temp Source: Oral (10/29 0901) BP: 101/39 (10/29 1740) Pulse Rate: 59 (10/29 1740)  Labs: Recent Labs    10/14/23 0515 10/15/23 0457 10/16/23 0426 10/16/23 1147 10/16/23 1637  HGB 10.2* 12.1  --  12.9  --   HCT 34.1* 40.9  --  44.7  --   PLT 177 149*  --  152  --   CREATININE 0.33* 0.36* 0.56 0.60  --   TROPONINIHS  --   --   --  46* 49*    Estimated Creatinine Clearance: 56.6 mL/min (by C-G formula based on SCr of 0.6 mg/dL).   Medical History: Past Medical History:  Diagnosis Date   Arthritis    knee   COPD (chronic obstructive pulmonary disease) (HCC)    Diabetes mellitus without complication (HCC)    type 2   Dyspnea    Hyperlipidemia    Hypertension    controlled   Neuromuscular disorder (HCC)    numbness in hands and feet   Shoulder pain, right    Wears dentures    upper and lower    Medications:  Patient was on enoxaparin 40 mg SubQ q24h, last dose 10/28 @ 2128  Assessment: 77 yo female presented to [facility] due to acute respiratory failure. PMG includes T2DM, HFpEF, and dementia.  Pharmacy consulted to start heparin infusion for possible PE.   Goal of Therapy:  Heparin level 0.3-0.7 units/ml Monitor platelets by anticoagulation protocol: Yes   Plan:  Give 4000 units bolus x 1 Start heparin infusion at 1050 units/hr Check anti-Xa level in 8 hours and daily while on  heparin Continue to monitor H&H and platelets  Barrie Folk, PharmD 10/16/2023,6:35 PM

## 2023-10-16 NOTE — Progress Notes (Signed)
  Chaplain On-Call responded to Rapid Response notification at 1025 hours to Room 124.  The patient was receiving care from the RR Team, and soon was transported to the ICU, bed 1.  Chaplain assured Staff of availability if requested.  Chaplain Evelena Peat M.Div., [facility]

## 2023-10-16 NOTE — Procedures (Signed)
Arterial Catheter Insertion Procedure Note  Tami Cummings  [id number]  05-31-1946  Date:10/16/23  Time:12:33 PM    Provider Performing: Tami Cummings    Procedure: Insertion of Arterial Line (13244) with US guidance (01027)   Indication(s) Blood pressure monitoring and/or need for frequent ABGs  Consent Unable to obtain consent due to emergent nature of procedure.  Anesthesia None   Time Out Verified patient identification, verified procedure, site/side was marked, verified correct patient position, special equipment/implants available, medications/allergies/relevant history reviewed, required imaging and test results available.   Sterile Technique Maximal sterile technique including full sterile barrier drape, hand hygiene, sterile gown, sterile gloves, mask, hair covering, sterile ultrasound probe cover (if used).   Procedure Description Area of catheter insertion was cleaned with chlorhexidine and draped in sterile fashion. With real-time ultrasound guidance an arterial catheter was placed into the left radial artery.  Appropriate arterial tracings confirmed on monitor.     Complications/Tolerance None; patient tolerated the procedure well.   EBL Minimal   Specimen(s) None   BIOPATCH applied to the insertion site.    Tami Cummings, AGACNP-BC [facility] Prefer epic messenger for cross cover needs If after hours, please call E-link

## 2023-10-16 NOTE — Procedures (Signed)
Intubation Procedure Note  Tami Cummings  [id number]  May 17, 1946  Date:10/16/23  Time:11:36 AM   Provider Performing:Masayo Fera D Elvina Sidle    Procedure: Intubation (31500)  Indication(s) Respiratory Failure  Consent Unable to obtain consent due to emergent nature of procedure.   Anesthesia Etomidate, Fentanyl, and Rocuronium   Time Out Verified patient identification, verified procedure, site/side was marked, verified correct patient position, special equipment/implants available, medications/allergies/relevant history reviewed, required imaging and test results available.   Sterile Technique Usual hand hygeine, masks, and gloves were used   Procedure Description Patient positioned in bed supine.  Sedation given as noted above.  Patient was intubated with endotracheal tube using Glidescope.  View was Grade 1 full glottis .  Number of attempts was 1.  Colorimetric CO2 detector was consistent with tracheal placement.   Complications/Tolerance None; patient tolerated the procedure well. Chest X-ray is ordered to verify placement.   EBL Minimal   Specimen(s) None   Size 8.0 ETT Tube secured at the 22 cm at the lip    [name], AGACNP-BC [facility] Prefer epic messenger for cross cover needs If after hours, please call E-link

## 2023-10-16 NOTE — Consult Note (Signed)
NAME:  Tami Cummings, MRN:  [id number], DOB:  Feb 27, 1946, LOS: 5 ADMISSION DATE:  10/11/2023 CONSULTATION DATE:  10/16/23 REFERRING MD: Dr. Fran Lowes  BRIEF SYNOPSIS 77 y.o female admitted with Acute Hypoxic Respiratory Failure in the setting of Acute Decompensated HFpEF and suspected pneumonia.  Hospital course complicated by Acute Metabolic Encephalopathy with suspected aspiration and development of Acute Respiratory Distress and undifferentiated shock requiring intubation and mechanical ventilation.  History of Present Illness:  Tami Cummings is a 77 y.o. female with medical history significant of DM2, dementia who presented from [facility] for concern for possible PNA.   Patient was admitted for dyspnea and hypoxia. The patient was recently hospitalized (08/31/23 - 09/10/23) for wound dehiscence after back surgery of T9-T12 fusion (done 07/30/23) following an injury that occurred in July of this year. After a washout she was discharged to a SNF with a PICC line for Vancomycin and Rocephin scheduled to d/c abx 10/11/23.  The patient has dementia as baseline and was given versed PRN d/t to agitation. A rapid response was called on the floor d/t suspected aspiration. The patient was saturating in the 60-70% with high flow McLeod and was subsequently intubated.  Please see "Significant Hospital Events" section below for full detailed hospital course.  Pertinent  Medical History  COPD, hypertension, diabetes mellitus hyperlipidemia, arthritis  Significant Hospital Events: Including procedures, antibiotic start and stop dates in addition to other pertinent events   10/11/23: admitted to hospital service and treated for acute hypoxic respiratory failure 10/25: Agitated, trying to get OOB 10/26: Confused, agitated, trying to get OOB.  Required Haldol and Zyprexa. 10/27: Confused, refusing medications 10/28: excessive secretions in upper airway requiring suctioning 10/16/23:  Transferred to the ICU due to increased work of breathing and O2 desaturation requiring mechanical ventilation and vasopressor support.  Micro Data:  10/11/23: COVID-19 PCR>>negative 10/11/23: Blood cultures x2>> no growth 10/12/23: MRSA PCR >> negative 10/16/23: Blood cultures >> 10/16/23: Tracheal aspirate>> 10/16/23: Urine>>  Antimicrobials:   Anti-infectives (From admission, onward)    Start     Dose/Rate Route Frequency Ordered Stop   10/16/23 1230  Ampicillin-Sulbactam (UNASYN) 3 g in sodium chloride 0.9 % 100 mL IVPB        3 g 200 mL/hr over 30 Minutes Intravenous Every 6 hours 10/16/23 1136     10/11/23 1715  vancomycin (VANCOCIN) IVPB 1000 mg/200 mL premix  Status:  Discontinued        1,000 mg 200 mL/hr over 60 Minutes Intravenous  Once 10/11/23 1714 10/11/23 1729   10/11/23 1715  ceFEPIme (MAXIPIME) 2 g in sodium chloride 0.9 % 100 mL IVPB  Status:  Discontinued        2 g 200 mL/hr over 30 Minutes Intravenous  Once 10/11/23 1714 10/11/23 1729   10/11/23 1715  azithromycin (ZITHROMAX) 500 mg in sodium chloride 0.9 % 250 mL IVPB  Status:  Discontinued        500 mg 250 mL/hr over 60 Minutes Intravenous  Once 10/11/23 1714 10/11/23 1729        Interim History / Subjective:  The patient remains critically ill requiring mechanical ventilation + respiratory distress + rhonchi + hypotension + hypoxia + vasopressor support  Objective   Blood pressure (!) 142/75, pulse 79, temperature 98.8 F (37.1 C), temperature source Oral, resp. rate 20, height 5\' 1"  (1.549 m), weight 78 kg, SpO2 98%.    Filed Weights   10/11/23 1144  Weight: 78 kg  REVIEW OF SYSTEMS PATIENT IS UNABLE TO PROVIDE COMPLETE REVIEW OF SYSTEMS DUE TO SEVERE CRITICAL ILLNESS   PHYSICAL EXAMINATION:  GENERAL:critically ill appearing, +resp distress EYES: Pupils equal, round, reactive to light.  No scleral icterus.  MOUTH: Moist mucosal membrane. INTUBATED NECK: Supple.  PULMONARY: ,  +rhonchi, +wheezing throughout, vent assisted, synchronous with the vent, even CARDIOVASCULAR: S1 and S2.  Regular rate and rhythm GASTROINTESTINAL: Soft, nontender, -distended.  MUSCULOSKELETAL: No swelling, clubbing, or edema.  NEUROLOGIC: obtunded,sedated SKIN:normal, warm to touch, Capillary refill delayed  Pulses present bilaterally     ASSESSMENT AND PLAN  #Undifferentiated Shock: ? Hypovolemic in setting of aggressive diuresis vs ? Septic vs ? Obstructive in setting of possible PE #Mild Tropinemia, suspect demand ischemia #Acute on Chronic HFpEF #Moderate Pulmonary Hypertension PMHx: HTN, HLD, HFpEF Echocardiogram 10/12/23: LVEF 55-60%, grade II DD, RV systolic function and size normal.  Moderately elevated pulmonary artery systolic pressure, mild MR -Continuous cardiac monitoring -Maintain MAP >65 -Cautious IV fluids -Vasopressors as needed to maintain MAP goal -Trend lactic acid until normalized -Trend HS Troponin until peaked -Repeat Echocardiogram pending -Diuresis as BP and renal function permits ~ holding due to hypotension -Hold antihypertensives -CTA ordered to r/o PE -US doppler of LE to r/o DVT  #Acute Hypoxic & Hypercapnic Respiratory Failure in the setting of presumed Aspiration, AECOPD, and questionable PE PMHx: COPD -Full vent support, implement lung protective strategies -Plateau pressures less than 30 cm H20 -Wean FiO2 & PEEP as tolerated to maintain O2 sats 88 to 92% -Follow intermittent Chest X-ray & ABG as needed -Spontaneous Breathing Trials when respiratory parameters met and mental status permits -Implement VAP Bundle -Bronchodilators and Pulmicort nebs -Start Steroids -ABX as above -Diuresis as BP and renal permits ~ holding due to hypotension -Consider Heparin gtt  #Query Aspiration  -Monitor fever curve -Trend WBC's & Procalcitonin -Follow cultures as above -Continue empiric Unasyn pending cultures & sensitivities   #Mild  Hypokalemia -Monitor I&O's / urinary output -Follow BMP -Ensure adequate renal perfusion -Avoid nephrotoxic agents as able -Replace electrolytes as indicated ~ Pharmacy following for assistance with electrolyte replacement  #Diabetes Mellitus with Hyperglycemia -CBG's q4h; Target range of 140 to 180 -SSI -Follow ICU Hypo/Hyperglycemia protocol   #Acute Metabolic Encephalopathy  #Sedation needs in setting of mechanical ventilation -Maintain a RASS goal of 0 to -1 -Fentanyl and Versed as needed to maintain RASS goal -Avoid sedating medications as able -Daily wake up assessment -UA is pending    Pt is critically ill with profound shock, hypoxia, and multiorgan failure.  High risk for further decompensation, cardiac arrest, and death.  Given current critical illness superimposed multiple chronic co morbidities and advanced age, overall long term prognosis is extremely poor.  Recommend DNR status.  Palliative Care consulted to assist with ongoing GOC conversations.   Best practice (right click and "Reselect all SmartList Selections" daily)  Diet: NPO Pain/Anxiety/Delirium protocol (if indicated): Yes (RASS goal -2) VAP protocol (if indicated): Yes DVT prophylaxis: LMWH GI prophylaxis: H2B Glucose control:  SSI Central venous access:  Yes, and it is still needed (outpatient Left Arm PICC) Arterial line:  Yes, and it is still needed Foley:  yes and is still needed Mobility:  bed rest  Code Status:  FULL Disposition:ICU  10/29: Pt's daughter updated via telephone and then at bedside by Dr. Belia Heman.  Labs   CBC: Recent Labs  Lab 10/11/23 1149 10/12/23 0649 10/13/23 0530 10/14/23 0515 10/15/23 0457  WBC 6.9 6.0 4.6 5.2 6.2  HGB 10.9*  11.4* 11.3* 10.2* 12.1  HCT 36.3 38.3 37.0 34.1* 40.9  MCV 88.3 86.8 86.4 88.1 87.6  PLT 203 206 197 177 149*    Basic Metabolic Panel: Recent Labs  Lab 10/12/23 0649 10/13/23 0530 10/14/23 0515 10/15/23 0457 10/16/23 0426  NA 144 143  143 145 143  K 3.4* 3.3* 2.9* 3.2* 3.2*  CL 99 93* 95* 95* 90*  CO2 36* 34* 40* 38* 40*  GLUCOSE 160* 169* 120* 168* 136*  BUN 9 10 12 12 15   CREATININE 0.31* 0.40* 0.33* 0.36* 0.56  CALCIUM 8.7* 8.9 8.6* 8.8* 8.6*  MG 2.1 2.0 2.1 2.2  --    GFR: Estimated Creatinine Clearance: 56.6 mL/min (by C-G formula based on SCr of 0.56 mg/dL). Recent Labs  Lab 10/11/23 1800 10/12/23 0649 10/13/23 0530 10/14/23 0515 10/15/23 0457  PROCALCITON <0.10  --   --   --   --   WBC  --  6.0 4.6 5.2 6.2    Liver Function Tests: No results for input(s): "AST", "ALT", "ALKPHOS", "BILITOT", "PROT", "ALBUMIN" in the last 168 hours. No results for input(s): "LIPASE", "AMYLASE" in the last 168 hours. No results for input(s): "AMMONIA" in the last 168 hours.  ABG No results found for: "PHART", "PCO2ART", "PO2ART", "HCO3", "TCO2", "ACIDBASEDEF", "O2SAT"   Coagulation Profile: No results for input(s): "INR", "PROTIME" in the last 168 hours.  Cardiac Enzymes: No results for input(s): "CKTOTAL", "CKMB", "CKMBINDEX", "TROPONINI" in the last 168 hours.  HbA1C: Hemoglobin A1C  Date/Time Value Ref Range Status  07/13/2016 12:00 AM 7.9  Final   HB A1C (BAYER DCA - WAIVED)  Date/Time Value Ref Range Status  08/15/2022 04:25 PM 9.0 (H) 4.8 - 5.6 % Final    Comment:             Prediabetes: 5.7 - 6.4          Diabetes: >6.4          Glycemic control for adults with diabetes: <7.0   01/05/2021 11:12 AM 9.4 (H) <7.0 % Final    Comment:                                          Diabetic Adult            <7.0                                       Healthy Adult        4.3 - 5.7                                                           (DCCT/NGSP) American Diabetes Association's Summary of Glycemic Recommendations for Adults with Diabetes: Hemoglobin A1c <7.0%. More stringent glycemic goals (A1c <6.0%) may further reduce complications at the cost of increased risk of hypoglycemia.    Hgb A1c MFr Bld   Date/Time Value Ref Range Status  10/11/2023 11:49 AM 6.2 (H) 4.8 - 5.6 % Final    Comment:    (NOTE) Pre diabetes:          5.7%-6.4%  Diabetes:              >  6.4%  Glycemic control for   <7.0% adults with diabetes   05/07/2023 01:50 PM 11.1 (H) 4.8 - 5.6 % Final    Comment:             Prediabetes: 5.7 - 6.4          Diabetes: >6.4          Glycemic control for adults with diabetes: <7.0     CBG: Recent Labs  Lab 10/15/23 0809 10/15/23 1213 10/15/23 1600 10/15/23 2112 10/16/23 0833  GLUCAP 161* 182* 142* 128* 168*     Past Medical History:  She,  has a past medical history of Arthritis, COPD (chronic obstructive pulmonary disease) (HCC), Diabetes mellitus without complication (HCC), Dyspnea, Hyperlipidemia, Hypertension, Neuromuscular disorder (HCC), Shoulder pain, right, and Wears dentures.   Surgical History:   Past Surgical History:  Procedure Laterality Date   BREAST BIOPSY Right 04/12/2016   stereo bx/clip- neg   CARPAL TUNNEL RELEASE  06/17/2000   CATARACT EXTRACTION W/PHACO Left 09/20/2015   Procedure: CATARACT EXTRACTION PHACO AND INTRAOCULAR LENS PLACEMENT (IOC);  Surgeon: Sherald Hess, MD;  [facility];  Service: Ophthalmology;  Laterality: Left;  DIABETIC - insulin   CATARACT EXTRACTION W/PHACO Right 06/03/2018   Procedure: CATARACT EXTRACTION PHACO AND INTRAOCULAR LENS PLACEMENT (IOC) RIGHT DIABETES;  Surgeon: Nevada Crane, MD;  Location: [facility];  Service: Ophthalmology;  Laterality: Right;  Diabetes-insulin dependent   CESAREAN SECTION     x2   CHOLECYSTECTOMY     EYE SURGERY     TOTAL ABDOMINAL HYSTERECTOMY W/ BILATERAL SALPINGOOPHORECTOMY       Social History:   reports that she quit smoking about 10 years ago. Her smoking use included cigarettes. She started smoking about 30 years ago. She has a 20 pack-year smoking history. She has never used smokeless tobacco. She reports that she does not  drink alcohol and does not use drugs.   Family History:  Her family history includes Cancer in her brother and sister; Heart disease in her father. There is no history of Breast cancer.   Allergies Allergies  Allergen Reactions   Empagliflozin    Actos [Pioglitazone] Other (See Comments)    CHF   Amlodipine Swelling    Leg swelling    Benazepril Other (See Comments)    angioedema   Hydrochlorothiazide Other (See Comments)    angioedema   Metformin And Related Diarrhea     Home Medications  Prior to Admission medications   Medication Sig Start Date End Date Taking? Authorizing Provider  acetaminophen (TYLENOL) 500 MG tablet Take 1,000 mg by mouth 3 (three) times daily.   Yes [provider]  albuterol (PROVENTIL) (2.5 MG/3ML) 0.083% nebulizer solution Take 2.5 mg by nebulization 4 (four) times daily.   Yes [provider]  amLODipine (NORVASC) 10 MG tablet Take 1 tablet (10 mg total) by mouth daily. 05/07/23  Yes Larae Grooms, NP  atenolol (TENORMIN) 100 MG tablet Take 1 tablet (100 mg total) by mouth daily. 05/07/23  Yes Larae Grooms, NP  atorvastatin (LIPITOR) 10 MG tablet Take 10 mg by mouth daily.   Yes [provider]  donepezil (ARICEPT) 5 MG tablet Take 5 mg by mouth at bedtime. 03/05/23 10/11/23 Yes [provider]  folic acid (FOLVITE) 400 MCG tablet Take 400 mcg by mouth daily.   Yes [provider]  gabapentin (NEURONTIN) 300 MG capsule Take 300 mg by mouth 3 (three) times daily.   Yes [provider]  insulin lispro (HUMALOG KWIKPEN) 100 UNIT/ML KwikPen Inject 0-6 Units into the skin 4 (four) times daily -  before meals and at bedtime.   Yes [provider]  ipratropium (ATROVENT) 0.02 % nebulizer solution Take 0.5 mg by nebulization 4 (four) times daily.   Yes [provider]  lidocaine 4 % Place 1 patch onto the skin daily.   Yes [provider]  methocarbamol (ROBAXIN) 750 MG  tablet Take 750 mg by mouth 4 (four) times daily.   Yes [provider]  polyethylene glycol (MIRALAX / GLYCOLAX) 17 g packet Take 17 g by mouth 2 (two) times daily.   Yes [provider]  senna (SENOKOT) 8.6 MG TABS tablet Take 2 tablets by mouth 2 (two) times daily.   Yes [provider]  sertraline (ZOLOFT) 25 MG tablet Take 1 tablet (25 mg total) by mouth daily. You can increase to 50 mg in 2 weeks if you are still having mood symptoms. 06/29/23  Yes Mecum, Erin E, PA-C  tamsulosin (FLOMAX) 0.4 MG CAPS capsule Take 0.4 mg by mouth at bedtime.   Yes [provider]  BD PEN NEEDLE MICRO U/F 32G X 6 MM MISC USE 4 TO 5 TIMES DAILY WITH INSULIN USE 10/24/21   Laural Benes, Megan P, DO  Blood Glucose Monitoring Suppl (ONE TOUCH ULTRA 2) w/Device KIT 1 each by Does not apply route 2 (two) times daily. 05/15/23   Larae Grooms, NP  Blood Glucose Monitoring Suppl (ONETOUCH VERIO) w/Device KIT Use to check blood sugar 2-3 times daily and document, bring results to provider visits. 06/12/22   Larae Grooms, NP  Leda Min 5-2.5-18.5 LF-MCG/0.5 injection  05/13/23   [provider]  glucose blood (ONETOUCH ULTRA) test strip 1 each by Other route 2 (two) times daily. Use as instructed 05/15/23   Larae Grooms, NP  glucose blood test strip Use to check blood sugar 2-3 times daily and document, bring results to provider visits. 05/23/22   Larae Grooms, NP  hydrALAZINE (APRESOLINE) 10 MG tablet Take 1 tablet (10 mg total) by mouth 2 (two) times daily. Patient not taking: Reported on 10/11/2023 05/07/23   Larae Grooms, NP  insulin degludec (TRESIBA FLEXTOUCH) 100 UNIT/ML FlexTouch Pen INJECT 40 UNITS SUBCUTANEOUSLY ONCE DAILY Patient not taking: Reported on 10/11/2023 05/02/23   Larae Grooms, NP  Lancets [facility] ULTRASOFT) lancets Use to check blood sugar 2-3 times daily and document, bring results to provider visits. 06/12/22   Larae Grooms, NP   Lancets [facility] ULTRASOFT) lancets 1 each by Other route 2 (two) times daily. Use as instructed 05/15/23   Larae Grooms, NP  lovastatin (MEVACOR) 40 MG tablet Take 1 tablet (40 mg total) by mouth daily with breakfast. Patient not taking: Reported on 10/11/2023 05/07/23   Larae Grooms, NP  OneTouch Delica Lancets 33G MISC 1 applicator by Does not apply route in the morning and at bedtime. 05/23/22   Larae Grooms, NP  Semaglutide,0.25 or 0.5MG /DOS, (OZEMPIC, 0.25 OR 0.5 MG/DOSE,) 2 MG/3ML SOPN Inject 0.25 mg into the skin once a week. Patient not taking: Reported on 10/11/2023 05/28/23   Larae Grooms, NP  spironolactone (ALDACTONE) 25 MG tablet Take 1 tablet (25 mg total) by mouth daily. 05/07/23   Larae Grooms, NP  VITAMIN D, CHOLECALCIFEROL, PO Take 5,000 Units by mouth daily. Patient not taking: Reported on 10/11/2023    [provider]       DVT/GI PRX  assessed I Assessed the need for Labs  I Assessed the need for Foley I Assessed the need for Central Venous Line Family Discussion when available I Assessed the need for Mobilization I made an Assessment of medications to be adjusted accordingly Safety Risk assessment completed  CASE DISCUSSED IN MULTIDISCIPLINARY ROUNDS WITH ICU TEAM     Critical Care Time devoted to patient care services described in this note is >55 minutes.   Critical care was necessary to treat /prevent imminent and life-threatening deterioration.   PATIENT WITH VERY POOR PROGNOSIS I ANTICIPATE PROLONGED ICU LOS  Patient is critically ill. Patient with Multiorgan failure and at high risk for cardiac arrest and death.   [name], AGACNP-BC [facility] Prefer epic messenger for cross cover needs If after hours, please call E-link

## 2023-10-16 NOTE — Progress Notes (Addendum)
Tami Cummings is a 77 y.o. female with medical history significant of DM2, dementia who presented from [facility] for dyspnea and hypoxia due to acute dCHF and mod pulm HTN and was started on diuresis.  Hospitalization complicated by severe hospital delirium with pt being agitated, trying to get out of bed and hitting and kicking.  Pt received Haldol and then Zyprexa PRN for her severe agitation.    It was noted yesterday that pt had excess secretion in her upper airway that could be heard rattling.  RT performed NT suction, and "suctioned moderate amount of frothy tan secreatons.  Breath sounds mostly clear post suctioning."  This morning, pt developed acute respiratory failure suddenly likely due to aspiration of her own secretion.  Rapid response was called.  Pt was sating 70's on non-rebreather.  Pt was transferred to stepdown with plan for heated HF to maintain O2 sats while avoiding BiPAP to push more secretions down further.  I talked to [name] on the phone who confirmed Full code and consented to intubate if necessary.  Discussed with ICU attending Dr. Belia Heman who decided pt needed intubation.  Daughter was made aware of the decision to intubate.

## 2023-10-16 NOTE — Significant Event (Signed)
Rapid Response Event Note   Reason for Call :  hypoxia  Initial Focused Assessment:  Rapid response RN arrived in patient's room with patient sitting up in bed with non rebreather mask at 15L on surrounded by 1C staff. Patient alert with accessory muscle use, increased work of breathing, oxygen saturations in mid 60s with good waveform on monitor. Weak cough. Lungs very diminished and congested to auscultation.  Interventions:  Dr. Fran Lowes arrived at bedside shortly after rapid response called, she personally assessed patient and ordered transfer to ICU.  Plan of Care:  Helped transfer patient to ICU 1, where Eboni RN gave report to [employer] at bedside. Bambi RT met [place] at bedside. Dr. Fran Lowes going to reach out to family and ICU provider team.  Event Summary:   MD Notified: Dr. Fran Lowes Call Time: 10:24 Arrival Time: 10:26 End Time: 11:00  Bennie Dallas, RN

## 2023-10-16 NOTE — Progress Notes (Addendum)
Peripherally Inserted Central Catheter Placement  The IV Nurse has discussed with the patient and/or persons authorized to consent for the patient, the purpose of this procedure and the potential benefits and risks involved with this procedure.  The benefits include less needle sticks, lab draws from the catheter, and the patient may be discharged home with the catheter. Risks include, but not limited to, infection, bleeding, blood clot (thrombus formation), and puncture of an artery; nerve damage and irregular heartbeat and possibility to perform a PICC exchange if needed/ordered by physician.  Alternatives to this procedure were also discussed.  Bard Power PICC patient education guide, fact sheet on infection prevention and patient information card has been provided to patient /or left at bedside.   Consent obtained with daughter at bedside.   PICC Placement Documentation  PICC Single Lumen 10/11/23 Left Basilic (Active)  Indication for Insertion or Continuance of Line Poor Vasculature-patient has had multiple peripheral attempts or PIVs lasting less than 24 hours 10/16/23 1200  Site Assessment Clean, Dry, Intact 10/16/23 1200  Line Status Infusing 10/16/23 1200  Dressing Type Transparent 10/16/23 1200  Dressing Status Antimicrobial disc in place;Clean, Dry, Intact 10/16/23 1200  Line Care Connections checked and tightened;Tubing changed 10/16/23 1200  Dressing Intervention Dressing reinforced 10/13/23 2300  Dressing Change Due 10/19/23 10/16/23 1200     PICC Triple Lumen 10/16/23 Basilic 41 cm 0 cm (Active)  Indication for Insertion or Continuance of Line Vasoactive infusions 10/16/23 1545  Exposed Catheter (cm) 0 cm 10/16/23 1545  Site Assessment Clean, Dry, Intact 10/16/23 1545  Lumen #1 Status Flushed;Saline locked;Blood return noted 10/16/23 1545  Lumen #2 Status Flushed;Saline locked;Blood return noted 10/16/23 1545  Lumen #3 Status Flushed;Saline locked;Blood return noted 10/16/23 1545   Dressing Type Transparent;Securing device 10/16/23 1545  Dressing Status Antimicrobial disc in place;Clean, Dry, Intact 10/16/23 1545  Line Care Connections checked and tightened 10/16/23 1545  Dressing Intervention New dressing 10/16/23 1545  Dressing Change Due 10/23/23 10/16/23 1545   Primary RN notified to remove right side peripheral IV and left single lumen PICC.     Andrey Cota 10/16/2023, 3:47 PM

## 2023-10-16 NOTE — Plan of Care (Signed)
  Problem: Education: Goal: Ability to describe self-care measures that may prevent or decrease complications (Diabetes Survival Skills Education) will improve Outcome: Not Progressing Goal: Individualized Educational Video(s) Outcome: Not Progressing   Problem: Coping: Goal: Ability to adjust to condition or change in health will improve Outcome: Not Progressing   Problem: Fluid Volume: Goal: Ability to maintain a balanced intake and output will improve Outcome: Not Progressing   Problem: Health Behavior/Discharge Planning: Goal: Ability to identify and utilize available resources and services will improve Outcome: Not Progressing Goal: Ability to manage health-related needs will improve Outcome: Not Progressing   Problem: Metabolic: Goal: Ability to maintain appropriate glucose levels will improve Outcome: Not Progressing   Problem: Nutritional: Goal: Maintenance of adequate nutrition will improve Outcome: Not Progressing Goal: Progress toward achieving an optimal weight will improve Outcome: Not Progressing   Problem: Skin Integrity: Goal: Risk for impaired skin integrity will decrease Outcome: Not Progressing   Problem: Tissue Perfusion: Goal: Adequacy of tissue perfusion will improve Outcome: Not Progressing   Problem: Education: Goal: Knowledge of General Education information will improve Description: Including pain rating scale, medication(s)/side effects and non-pharmacologic comfort measures Outcome: Not Progressing   Problem: Health Behavior/Discharge Planning: Goal: Ability to manage health-related needs will improve Outcome: Not Progressing   Problem: Clinical Measurements: Goal: Ability to maintain clinical measurements within normal limits will improve Outcome: Not Progressing Goal: Will remain free from infection Outcome: Not Progressing Goal: Diagnostic test results will improve Outcome: Not Progressing Goal: Respiratory complications will  improve Outcome: Not Progressing Goal: Cardiovascular complication will be avoided Outcome: Not Progressing   Problem: Activity: Goal: Risk for activity intolerance will decrease Outcome: Not Progressing   Problem: Nutrition: Goal: Adequate nutrition will be maintained Outcome: Not Progressing   Problem: Coping: Goal: Level of anxiety will decrease Outcome: Not Progressing   Problem: Elimination: Goal: Will not experience complications related to bowel motility Outcome: Not Progressing Goal: Will not experience complications related to urinary retention Outcome: Not Progressing   Problem: Pain Management: Goal: General experience of comfort will improve Outcome: Not Progressing   Problem: Safety: Goal: Ability to remain free from injury will improve Outcome: Not Progressing   Problem: Skin Integrity: Goal: Risk for impaired skin integrity will decrease Outcome: Not Progressing   Problem: Activity: Goal: Ability to tolerate increased activity will improve Outcome: Not Progressing   Problem: Respiratory: Goal: Ability to maintain adequate ventilation will improve Outcome: Not Progressing Goal: Ability to maintain a clear airway will improve Outcome: Not Progressing   Problem: Activity: Goal: Ability to tolerate increased activity will improve Outcome: Not Progressing   Problem: Respiratory: Goal: Ability to maintain a clear airway and adequate ventilation will improve Outcome: Not Progressing   Problem: Role Relationship: Goal: Method of communication will improve Outcome: Not Progressing   Problem: Activity: Goal: Ability to tolerate increased activity will improve Outcome: Not Progressing   Problem: Respiratory: Goal: Ability to maintain a clear airway and adequate ventilation will improve Outcome: Not Progressing   Problem: Role Relationship: Goal: Method of communication will improve Outcome: Not Progressing

## 2023-10-17 ENCOUNTER — Inpatient Hospital Stay
Admit: 2023-10-17 | Discharge: 2023-10-17 | Disposition: A | Discharge status: Home / Self Care | Payer: 59 | Attending: Pulmonary Disease | Admitting: Pulmonary Disease

## 2023-10-17 ENCOUNTER — Inpatient Hospital Stay: Payer: 59

## 2023-10-17 ENCOUNTER — Inpatient Hospital Stay (HOSPITAL_COMMUNITY)
Admit: 2023-10-17 | Discharge: 2023-10-17 | Disposition: A | Discharge status: Home / Self Care | Payer: 59 | Attending: Pulmonary Disease

## 2023-10-17 DIAGNOSIS — I2699 Other pulmonary embolism without acute cor pulmonale: Secondary | ICD-10-CM | POA: Diagnosis not present

## 2023-10-17 DIAGNOSIS — J9621 Acute and chronic respiratory failure with hypoxia: Secondary | ICD-10-CM

## 2023-10-17 DIAGNOSIS — J9622 Acute and chronic respiratory failure with hypercapnia: Secondary | ICD-10-CM

## 2023-10-17 DIAGNOSIS — J189 Pneumonia, unspecified organism: Secondary | ICD-10-CM

## 2023-10-17 DIAGNOSIS — Z7189 Other specified counseling: Secondary | ICD-10-CM

## 2023-10-17 DIAGNOSIS — R0902 Hypoxemia: Secondary | ICD-10-CM | POA: Diagnosis not present

## 2023-10-17 DIAGNOSIS — I82469 Acute embolism and thrombosis of unspecified calf muscular vein: Secondary | ICD-10-CM | POA: Diagnosis not present

## 2023-10-17 DIAGNOSIS — Y95 Nosocomial condition: Secondary | ICD-10-CM

## 2023-10-17 DIAGNOSIS — J90 Pleural effusion, not elsewhere classified: Secondary | ICD-10-CM | POA: Diagnosis not present

## 2023-10-17 DIAGNOSIS — I272 Pulmonary hypertension, unspecified: Secondary | ICD-10-CM | POA: Diagnosis not present

## 2023-10-17 LAB — TROPONIN I (HIGH SENSITIVITY)
Troponin I (High Sensitivity): 50 ng/L — ABNORMAL HIGH (ref <18)
Troponin I (High Sensitivity): 60 ng/L — ABNORMAL HIGH (ref <18)
Troponin I (High Sensitivity): 60 ng/L — ABNORMAL HIGH (ref <18)

## 2023-10-17 LAB — AMYLASE, PLEURAL OR PERITONEAL FLUID: Amylase, Fluid: 66 U/L

## 2023-10-17 LAB — STREP PNEUMONIAE URINARY ANTIGEN: Strep Pneumo Urinary Antigen: NEGATIVE

## 2023-10-17 LAB — CBC
HCT: 41.7 % (ref 36.0–46.0)
Hemoglobin: 12.2 g/dL (ref 12.0–15.0)
MCH: 25.5 pg — ABNORMAL LOW (ref 26.0–34.0)
MCHC: 29.3 g/dL — ABNORMAL LOW (ref 30.0–36.0)
MCV: 87.1 fL (ref 80.0–100.0)
Platelets: 141 10*3/uL — ABNORMAL LOW (ref 150–400)
RBC: 4.79 MIL/uL (ref 3.87–5.11)
RDW: 14.3 % (ref 11.5–15.5)
WBC: 9 10*3/uL (ref 4.0–10.5)
nRBC: 0 % (ref 0.0–0.2)

## 2023-10-17 LAB — BODY FLUID CELL COUNT WITH DIFFERENTIAL
Eos, Fluid: 0 %
Lymphs, Fluid: 45 %
Monocyte-Macrophage-Serous Fluid: 25 %
Neutrophil Count, Fluid: 30 %
Total Nucleated Cell Count, Fluid: 180 uL

## 2023-10-17 LAB — BLOOD GAS, ARTERIAL
Acid-Base Excess: 17.1 mmol/L — ABNORMAL HIGH (ref 0.0–2.0)
Acid-Base Excess: 19.2 mmol/L — ABNORMAL HIGH (ref 0.0–2.0)
Bicarbonate: 45.6 mmol/L — ABNORMAL HIGH (ref 20.0–28.0)
Bicarbonate: 48 mmol/L — ABNORMAL HIGH (ref 20.0–28.0)
FIO2: 90 %
FIO2: 40 %
MECHVT: 400 mL
MECHVT: 400 mL
O2 Saturation: 100 %
O2 Saturation: 98.1 %
PEEP: 12 cmH2O
PEEP: 8 cmH2O
Patient temperature: 37
Patient temperature: 37
RATE: 12 {breaths}/min
RATE: 12 {breaths}/min
pCO2 arterial: 72 mm[Hg] (ref 32–48)
pCO2 arterial: 74 mm[Hg] (ref 32–48)
pH, Arterial: 7.41 (ref 7.35–7.45)
pH, Arterial: 7.42 (ref 7.35–7.45)
pO2, Arterial: 185 mm[Hg] — ABNORMAL HIGH (ref 83–108)
pO2, Arterial: 78 mm[Hg] — ABNORMAL LOW (ref 83–108)

## 2023-10-17 LAB — GLUCOSE, CAPILLARY
Glucose-Capillary: 148 mg/dL — ABNORMAL HIGH (ref 70–99)
Glucose-Capillary: 165 mg/dL — ABNORMAL HIGH (ref 70–99)
Glucose-Capillary: 165 mg/dL — ABNORMAL HIGH (ref 70–99)
Glucose-Capillary: 119 mg/dL — ABNORMAL HIGH (ref 70–99)
Glucose-Capillary: 125 mg/dL — ABNORMAL HIGH (ref 70–99)

## 2023-10-17 LAB — CULTURE, BLOOD (ROUTINE X 2)
Culture: NO GROWTH
Special Requests: ADEQUATE

## 2023-10-17 LAB — RENAL FUNCTION PANEL
Albumin: 2.7 g/dL — ABNORMAL LOW (ref 3.5–5.0)
Anion gap: 13 (ref 5–15)
BUN: 31 mg/dL — ABNORMAL HIGH (ref 8–23)
CO2: 35 mmol/L — ABNORMAL HIGH (ref 22–32)
Calcium: 8.4 mg/dL — ABNORMAL LOW (ref 8.9–10.3)
Chloride: 95 mmol/L — ABNORMAL LOW (ref 98–111)
Creatinine, Ser: 0.79 mg/dL (ref 0.44–1.00)
GFR, Estimated: >60 mL/min (ref >60)
Glucose, Bld: 157 mg/dL — ABNORMAL HIGH (ref 70–99)
Phosphorus: 4.9 mg/dL — ABNORMAL HIGH (ref 2.5–4.6)
Potassium: 3.7 mmol/L (ref 3.5–5.1)
Sodium: 143 mmol/L (ref 135–145)

## 2023-10-17 LAB — ECHOCARDIOGRAM COMPLETE
AR max vel: 1.92 cm2
AV Area VTI: 1.94 cm2
AV Area mean vel: 1.81 cm2
AV Mean grad: 5 mm[Hg]
AV Peak grad: 10.8 mm[Hg]
Ao pk vel: 1.64 m/s
Area-P 1/2: 2.58 cm2
Height: 61 in
MV VTI: 1.95 cm2
S' Lateral: 2.7 cm
Weight: 2751.34 [oz_av]

## 2023-10-17 LAB — RESPIRATORY PANEL BY PCR

## 2023-10-17 LAB — MRSA NEXT GEN BY PCR, NASAL: MRSA by PCR Next Gen: NOT DETECTED

## 2023-10-17 LAB — GLUCOSE, PLEURAL OR PERITONEAL FLUID: Glucose, Fluid: 133 mg/dL

## 2023-10-17 LAB — CREATININE, SERUM
Creatinine, Ser: 0.77 mg/dL (ref 0.44–1.00)
GFR, Estimated: >60 mL/min (ref >60)

## 2023-10-17 LAB — PROTEIN, PLEURAL OR PERITONEAL FLUID: Total protein, fluid: <3 g/dL

## 2023-10-17 LAB — HEMATOCRIT: HCT: 32.2 % — ABNORMAL LOW (ref 36.0–46.0)

## 2023-10-17 LAB — PROCALCITONIN: Procalcitonin: 0.9 ng/mL

## 2023-10-17 LAB — BRAIN NATRIURETIC PEPTIDE: B Natriuretic Peptide: 256.3 pg/mL — ABNORMAL HIGH (ref 0.0–100.0)

## 2023-10-17 LAB — LACTATE DEHYDROGENASE, PLEURAL OR PERITONEAL FLUID: LD, Fluid: 165 U/L — ABNORMAL HIGH (ref 3–23)

## 2023-10-17 LAB — MAGNESIUM: Magnesium: 2.1 mg/dL (ref 1.7–2.4)

## 2023-10-17 LAB — HEPARIN LEVEL (UNFRACTIONATED)
Heparin Unfractionated: 0.51 [IU]/mL (ref 0.30–0.70)
Heparin Unfractionated: 0.61 [IU]/mL (ref 0.30–0.70)

## 2023-10-17 LAB — ALBUMIN: Albumin: 2.5 g/dL — ABNORMAL LOW (ref 3.5–5.0)

## 2023-10-17 MED ORDER — PROPOFOL 1000 MG/100ML IV EMUL
5.0000 ug/kg/min | INTRAVENOUS | Status: DC
  Administered 2023-10-17: 60 ug/kg/min via INTRAVENOUS
  Administered 2023-10-17: 35 ug/kg/min via INTRAVENOUS
  Administered 2023-10-17: 5 ug/kg/min via INTRAVENOUS
  Administered 2023-10-18 (×2): 50 ug/kg/min via INTRAVENOUS
  Filled 2023-10-17 (×5): qty 100

## 2023-10-17 MED ORDER — PROSOURCE TF20 ENFIT COMPATIBL EN LIQD
60.0000 mL | Freq: Four times a day (QID) | ENTERAL | Status: DC
  Administered 2023-10-17 – 2023-10-18 (×3): 60 mL
  Filled 2023-10-17: qty 60

## 2023-10-17 MED ORDER — VANCOMYCIN HCL 1750 MG/350ML IV SOLN
1750.0000 mg | Freq: Once | INTRAVENOUS | Status: AC
  Administered 2023-10-17: 1750 mg via INTRAVENOUS
  Filled 2023-10-17: qty 350

## 2023-10-17 MED ORDER — PIVOT 1.5 CAL PO LIQD: 1000.0000 mL | ORAL | Status: DC

## 2023-10-17 MED ORDER — PANTOPRAZOLE SODIUM 40 MG IV SOLR
40.0000 mg | Freq: Every day | INTRAVENOUS | Status: DC
  Administered 2023-10-17 – 2023-10-18 (×2): 40 mg via INTRAVENOUS
  Filled 2023-10-17 (×2): qty 10

## 2023-10-17 MED ORDER — IOHEXOL 350 MG/ML SOLN
75.0000 mL | Freq: Once | INTRAVENOUS | Status: AC | PRN
  Administered 2023-10-17: 75 mL via INTRAVENOUS

## 2023-10-17 MED ORDER — HEPARIN (PORCINE) 25000 UT/250ML-% IV SOLN
1050.0000 [IU]/h | INTRAVENOUS | Status: DC
  Administered 2023-10-17: 1050 [IU]/h via INTRAVENOUS
  Filled 2023-10-17: qty 250

## 2023-10-17 MED ORDER — HYDROCORTISONE SOD SUC (PF) 100 MG IJ SOLR
50.0000 mg | Freq: Four times a day (QID) | INTRAMUSCULAR | Status: DC
  Administered 2023-10-17 – 2023-10-19 (×8): 50 mg via INTRAVENOUS
  Filled 2023-10-17 (×9): qty 2

## 2023-10-17 MED ORDER — VITAL 1.5 CAL PO LIQD
1000.0000 mL | ORAL | Status: DC
  Administered 2023-10-17: 1000 mL

## 2023-10-17 MED ORDER — PIPERACILLIN-TAZOBACTAM 3.375 G IVPB
3.3750 g | Freq: Three times a day (TID) | INTRAVENOUS | Status: DC
  Administered 2023-10-17 – 2023-10-19 (×6): 3.375 g via INTRAVENOUS
  Filled 2023-10-17 (×6): qty 50

## 2023-10-17 MED ORDER — ENOXAPARIN SODIUM 40 MG/0.4ML IJ SOSY
40.0000 mg | PREFILLED_SYRINGE | INTRAMUSCULAR | Status: DC
  Administered 2023-10-17: 40 mg via SUBCUTANEOUS
  Filled 2023-10-17: qty 0.4

## 2023-10-17 MED ORDER — LACTATED RINGERS IV SOLN: INTRAVENOUS | Status: DC

## 2023-10-17 MED ORDER — FREE WATER
160.0000 mL | Freq: Four times a day (QID) | Status: DC
  Administered 2023-10-17 – 2023-10-18 (×4): 160 mL

## 2023-10-17 MED ORDER — IPRATROPIUM-ALBUTEROL 0.5-2.5 (3) MG/3ML IN SOLN
3.0000 mL | Freq: Four times a day (QID) | RESPIRATORY_TRACT | Status: DC
  Administered 2023-10-17 – 2023-10-22 (×18): 3 mL via RESPIRATORY_TRACT
  Filled 2023-10-17 (×18): qty 3

## 2023-10-17 NOTE — TOC Initial Note (Signed)
Transition of Care [facility]) - Initial/Assessment Note    Patient Details  Name: Tami Cummings MRN: [id number] Date of Birth: May 19, 1946  [facility] CM/SW Contact:    Chapman Fitch, RN Phone Number: 10/17/2023, 12:05 PM  Clinical Narrative:                  Patient transferred to ICU 10/29 Currently intubated Admitted from [facility], Confirmed with Tanya   High risk assessment initiated Confirmed with [name] plan is to return to [facility] at discharge Patient will require FL2 closer to discharge  Expected Discharge Plan: Skilled Nursing Facility     Patient Goals and CMS Choice            Expected Discharge Plan and Services                                              Prior Living Arrangements/Services                       Activities of Daily Living   ADL Screening (condition at time of admission) Independently performs ADLs?: No Does the patient have a NEW difficulty with bathing/dressing/toileting/self-feeding that is expected to last >3 days?: No Does the patient have a NEW difficulty with getting in/out of bed, walking, or climbing stairs that is expected to last >3 days?: No Does the patient have a NEW difficulty with communication that is expected to last >3 days?: No Is the patient deaf or have difficulty hearing?: No Does the patient have difficulty seeing, even when wearing glasses/contacts?: No Does the patient have difficulty concentrating, remembering, or making decisions?: Yes  Permission Sought/Granted                  Emotional Assessment              Admission diagnosis:  Hypoxia [R09.02] Acute respiratory failure with hypoxia (HCC) [J96.01] Patient Active Problem List   Diagnosis Date Noted   Hypoxia 10/11/2023   Open T11 vertebral fracture (HCC) 07/23/2023   Acute midline thoracic back pain 07/20/2023   Adjustment disorder with anxious  mood 06/29/2023   Mild dementia without behavioral disturbance, psychotic disturbance, mood disturbance, or anxiety (HCC) 01/23/2023   Osteopenia 01/01/2021   Vitamin D deficiency 10/18/2020   Vitamin B12 deficiency 10/18/2020   CKD (chronic kidney disease) stage 3, GFR 30-59 ml/min (HCC) 10/08/2020   Senile purpura (HCC) 04/10/2017   Morbid obesity (HCC) 04/10/2017   Advance care planning 04/10/2017   Noncompliance 10/30/2016   DM type 2 with diabetic peripheral neuropathy (HCC)    Hyperlipidemia associated with type 2 diabetes mellitus (HCC)    Hypertension associated with diabetes (HCC)    PCP:  Larae Grooms, NP Pharmacy:   [facility] [address] [place] - [address] [place] [address] Phone: [phone] Fax: [phone]  [facility] Delivery - [place], [place] - [address] [place] [address] Phone: [phone] Fax: [phone]  [facility] [address] [place] - [address] [address] [place] [address] Phone: [phone] Fax: [phone]  [facility] - [place], [place] - [address] [place] [address] Phone: [phone] Fax: [phone]  Social Determinants of Health (SDOH) Social History: SDOH Screenings   Food Insecurity: No Food Insecurity (10/12/2023)  Housing: Low Risk  (10/12/2023)  Transportation Needs: No Transportation Needs (10/12/2023)  Utilities: Not At Risk (10/12/2023)  Alcohol Screen: Low Risk  (08/08/2022)  Depression (PHQ2-9): Low Risk  (05/07/2023)  Financial Resource Strain: Low Risk  (09/03/2023)   Received from [facility]  Physical Activity: Inactive (08/08/2022)  Social Connections: Unknown (08/08/2022)  Recent Concern: Social Connections - Socially Isolated (08/08/2022)  Stress: No Stress Concern Present (08/08/2022)  Tobacco Use: Medium Risk (10/12/2023)   SDOH Interventions:     Readmission Risk  Interventions    10/17/2023   12:04 PM  Readmission Risk Prevention Plan  Transportation Screening Complete  Home Care Screening --  Medication Review (RN CM) Complete

## 2023-10-17 NOTE — Progress Notes (Signed)
NAME:  Tami Cummings, MRN:  [id number], DOB:  29-Aug-1946, LOS: 6 ADMISSION DATE:  10/11/2023, CONSULTATION DATE: 10/16/2023 REFERRING MD: Dr. Fran Lowes, CHIEF COMPLAINT: Shortness of Breath    History of Present Illness:  Tami Cummings is a 77 y.o. female with medical history significant of DM2, dementia who presented from [facility] for concern for possible PNA.    Patient was admitted for dyspnea and hypoxia. The patient was recently hospitalized (08/31/23 - 09/10/23) for wound dehiscence after back surgery of T9-T12 fusion (done 07/30/23) following an injury that occurred in July of this year. After a washout she was discharged to a SNF with a PICC line for Vancomycin and Rocephin scheduled to d/c abx 10/11/23.   The patient has dementia as baseline and was given versed PRN d/t to agitation. A rapid response was called on the floor d/t suspected aspiration. The patient was saturating in the 60-70% with high flow [place] and was subsequently intubated.   Please see "Significant Hospital Events" section below for full detailed hospital course.  Pertinent  Medical History  COPD HTN  Type II Diabetes Mellitus  Arthritis   Significant Hospital Events: Including procedures, antibiotic start and stop dates in addition to other pertinent events   10/11/23: admitted to hospital service and treated for acute hypoxic respiratory failure 10/25: Agitated, trying to get OOB 10/26: Confused, agitated, trying to get OOB.  Required Haldol and Zyprexa. 10/27: Confused, refusing medications 10/28: excessive secretions in upper airway requiring suctioning 10/29: Transferred to the ICU due to increased work of breathing and O2 desaturation requiring mechanical ventilation and vasopressor support 10/30: Pt remains mechanically intubated vent settings improving: FiO2 45%/PEEP 10.  Requiring levophed gtt @5  mcg/min and vasopressin @0 .04 units/min   Micro Data:  10/11/23: COVID-19  PCR>>negative 10/11/23: Blood cultures x2>> no growth 10/12/23: MRSA PCR >> negative 10/16/23: Blood cultures >>negative  10/16/23: Tracheal aspirate>>few wbc present/no organisms seen  10/16/23: Urine>>moderate leukocytes/wbc 21-50/few bacteria  10/17/23: RVP>> 10/17/23: Strep pneumo antigen>> 10/17/23: Legionella pneumo>>  Antimicrobials:    Anti-infectives (From admission, onward)    Start     Dose/Rate Route Frequency Ordered Stop   10/16/23 1230  Ampicillin-Sulbactam (UNASYN) 3 g in sodium chloride 0.9 % 100 mL IVPB        3 g 200 mL/hr over 30 Minutes Intravenous Every 6 hours 10/16/23 1136     10/11/23 1715  vancomycin (VANCOCIN) IVPB 1000 mg/200 mL premix  Status:  Discontinued        1,000 mg 200 mL/hr over 60 Minutes Intravenous  Once 10/11/23 1714 10/11/23 1729   10/11/23 1715  ceFEPIme (MAXIPIME) 2 g in sodium chloride 0.9 % 100 mL IVPB  Status:  Discontinued        2 g 200 mL/hr over 30 Minutes Intravenous  Once 10/11/23 1714 10/11/23 1729   10/11/23 1715  azithromycin (ZITHROMAX) 500 mg in sodium chloride 0.9 % 250 mL IVPB  Status:  Discontinued        500 mg 250 mL/hr over 60 Minutes Intravenous  Once 10/11/23 1714 10/11/23 1729      Interim History / Subjective:  No acute events overnight.  Pt following commands on low dose fentanyl gtt  Objective   Blood pressure (!) 133/56, pulse 65, temperature 98.6 F (37 C), temperature source Axillary, resp. rate 13, height 5\' 1"  (1.549 m), weight 78 kg, SpO2 100%.    Vent Mode: PRVC FiO2 (%):  [45 %-100 %] 45 % Set Rate:  [12  bmp] 12 bmp Vt Set:  [400 mL] 400 mL PEEP:  [10 cmH20-12 cmH20] 10 cmH20 Plateau Pressure:  [30 cmH20] 30 cmH20   Intake/Output Summary (Last 24 hours) at 10/17/2023 0802 Last data filed at 10/17/2023 0600 Gross per 24 hour  Intake 2606.1 ml  Output 555 ml  Net 2051.1 ml   Filed Weights   10/11/23 1144  Weight: 78 kg    Examination: General: Acute on chronically-ill appearing  female, NAD mechanically intubated  HENT: Supple, no JVD  Lungs: Faint rhonchi, even, non labored  Cardiovascular: NSR, s1s2, no m/r/g, 2+ radial/1+ distal pulses, trace generalized edema  Abdomen: +BS x4, obese, soft, non distended  Extremities: Normal bulk and tone, moves all extremities  Neuro:  Sedated, following commands, pupils pinpoint/sluggish  GU: Indwelling foley catheter draining yellow urine   Resolved Hospital Problem list     Assessment & Plan:   #Acute metabolic encephalopathy complicated by  #Sedation needs in setting of mechanical ventilation - Correct metabolic derangements  - Maintain a RASS goal of 0 to -1 - PAD protocol to maintain RASS goal: will start propofol gtt and change fentanyl to prn   - Will attempt to avoid benzo's if able   - Avoid sedating medications as able - Daily wake up assessment - CT Head results pending    #Undifferentiated Shock: hypovolemic in setting of aggressive diuresis vs septic vs possible obstructive in setting of possible PE #Mildly elevated troponin likely secondary demand ischemia  #Acute on chronic HFpEF #Moderate pulmonary hypertension PMHx: HTN, HLD, HFpEF Echocardiogram 10/12/23: LVEF 55-60%, grade II DD, RV systolic function and size normal.  Moderately elevated pulmonary artery systolic pressure, mild MR - Continuous telemetry monitoring - Gentle iv fluid resuscitation and/or vasopressin/levophed gtts to maintain map 65 or higher  - Trend lactic acid until normalized - Trend HS Troponin until peaked - Repeat Echocardiogram pending - Diuresis as BP and renal function permits ~ holding due to hypotension - Hold outpatient antihypertensives for now due to hypotension  - CTA Chest PE and US Venous Doppler Bilateral Lower Extremities results pending    #Acute hypoxic hypercapnic respiratory failure  #AECOPD #Questionable PE #Pneumonia  #Bilateral pleural effusions  - Full vent support, implement lung protective  strategies - Plateau pressures less than 30 cm H20 - Wean FiO2 & PEEP as tolerated to maintain O2 sats 88 to 92% - Follow intermittent Chest X-ray & ABG as needed - SBT as respiratory status and mentation permits  - VAP bundle implemented  - Scheduled and nebulized steroids  - IV steroids: wean as tolerated  - Diuresis as BP and renal function permits~holding due to hypotension - Will likely require left-sided chest tube and right-sided thoracentesis   #Mild hypokalemia - Trend BMP  - Replace electrolytes as indicated ~ Pharmacy following for assistance with electrolyte replacement - Strict I's&O's - Gentle iv fluids prior and after CTA Chest PE   #Pneumonia  - Trend WBC and monitor fever curve  - Trend PCT  - Follow cultures as outlined above  - Will broaden abx to zosyn pending culture results and sensitivities    #Diabetes Mellitus - CBG's q4h; Target range of 140 to 180 - SSI - Follow ICU hypo/hyperglycemia protocol   Best Practice (right click and "Reselect all SmartList Selections" daily)   Diet/type: Will consult dietitian to initiate TF's  DVT prophylaxis: systemic heparin GI prophylaxis: H2B Lines: PICC line and still needed Foley:  Yes, and it is still needed Code Status:  full code Last date of multidisciplinary goals of care discussion [10/17/2023]  10/30: Pts son updated at bedside by ICU Intensivist regarding plan of care and pts condition  Labs   CBC: Recent Labs  Lab 10/13/23 0530 10/14/23 0515 10/15/23 0457 10/16/23 1147 10/17/23 0255  WBC 4.6 5.2 6.2 7.5 9.0  HGB 11.3* 10.2* 12.1 12.9 12.2  HCT 37.0 34.1* 40.9 44.7 41.7  MCV 86.4 88.1 87.6 88.2 87.1  PLT 197 177 149* 152 141*    Basic Metabolic Panel: Recent Labs  Lab 10/13/23 0530 10/14/23 0515 10/15/23 0457 10/16/23 0426 10/16/23 1147 10/16/23 1939 10/17/23 0453  NA 143 143 145 143 145  --  143  K 3.3* 2.9* 3.2* 3.2* 3.3* 3.8 3.7  CL 93* 95* 95* 90* 92*  --  95*  CO2 34* 40* 38*  40* 41*  --  35*  GLUCOSE 169* 120* 168* 136* 159*  --  157*  BUN 10 12 12 15 18   --  31*  CREATININE 0.40* 0.33* 0.36* 0.56 0.60  --  0.79  CALCIUM 8.9 8.6* 8.8* 8.6* 8.9  --  8.4*  MG 2.0 2.1 2.2  --   --  2.0 2.1  PHOS  --   --   --   --   --  5.1* 4.9*   GFR: Estimated Creatinine Clearance: 56.6 mL/min (by C-G formula based on SCr of 0.79 mg/dL). Recent Labs  Lab 10/11/23 1800 10/12/23 0649 10/14/23 0515 10/15/23 0457 10/16/23 1147 10/16/23 1637 10/17/23 0255  PROCALCITON <0.10  --   --   --  0.45  --  0.90  WBC  --    < > 5.2 6.2 7.5  --  9.0  LATICACIDVEN  --   --   --   --  2.8* 1.2  --    < > = values in this interval not displayed.    Liver Function Tests: Recent Labs  Lab 10/16/23 1147 10/17/23 0453  AST 20  --   ALT 15  --   ALKPHOS 83  --   BILITOT 0.9  --   PROT 6.8  --   ALBUMIN 3.4* 2.7*   No results for input(s): "LIPASE", "AMYLASE" in the last 168 hours. No results for input(s): "AMMONIA" in the last 168 hours.  ABG    Component Value Date/Time   PHART 7.41 10/17/2023 0453   PCO2ART 72 (HH) 10/17/2023 0453   PO2ART 185 (H) 10/17/2023 0453   HCO3 45.6 (H) 10/17/2023 0453   O2SAT 100 10/17/2023 0453     Coagulation Profile: Recent Labs  Lab 10/16/23 1939  INR 1.1    Cardiac Enzymes: No results for input(s): "CKTOTAL", "CKMB", "CKMBINDEX", "TROPONINI" in the last 168 hours.  HbA1C: Hemoglobin A1C  Date/Time Value Ref Range Status  07/13/2016 12:00 AM 7.9  Final   HB A1C (BAYER DCA - WAIVED)  Date/Time Value Ref Range Status  08/15/2022 04:25 PM 9.0 (H) 4.8 - 5.6 % Final    Comment:             Prediabetes: 5.7 - 6.4          Diabetes: >6.4          Glycemic control for adults with diabetes: <7.0   01/05/2021 11:12 AM 9.4 (H) <7.0 % Final    Comment:  Diabetic Adult            <7.0                                       Healthy Adult        4.3 - 5.7                                                            (DCCT/NGSP) American Diabetes Association's Summary of Glycemic Recommendations for Adults with Diabetes: Hemoglobin A1c <7.0%. More stringent glycemic goals (A1c <6.0%) may further reduce complications at the cost of increased risk of hypoglycemia.    Hgb A1c MFr Bld  Date/Time Value Ref Range Status  10/11/2023 11:49 AM 6.2 (H) 4.8 - 5.6 % Final    Comment:    (NOTE) Pre diabetes:          5.7%-6.4%  Diabetes:              >6.4%  Glycemic control for   <7.0% adults with diabetes   05/07/2023 01:50 PM 11.1 (H) 4.8 - 5.6 % Final    Comment:             Prediabetes: 5.7 - 6.4          Diabetes: >6.4          Glycemic control for adults with diabetes: <7.0     CBG: Recent Labs  Lab 10/16/23 1634 10/16/23 1947 10/16/23 2318 10/17/23 0318 10/17/23 0736  GLUCAP 160* 193* 154* 148* 165*    Review of Systems:   Unable to assess pt mechanically intubated   Past Medical History:  She,  has a past medical history of Arthritis, COPD (chronic obstructive pulmonary disease) (HCC), Diabetes mellitus without complication (HCC), Dyspnea, Hyperlipidemia, Hypertension, Neuromuscular disorder (HCC), Shoulder pain, right, and Wears dentures.   Surgical History:   Past Surgical History:  Procedure Laterality Date   BREAST BIOPSY Right 04/12/2016   stereo bx/clip- neg   CARPAL TUNNEL RELEASE  06/17/2000   CATARACT EXTRACTION W/PHACO Left 09/20/2015   Procedure: CATARACT EXTRACTION PHACO AND INTRAOCULAR LENS PLACEMENT (IOC);  Surgeon: Sherald Hess, MD;  Location: [facility];  Service: Ophthalmology;  Laterality: Left;  DIABETIC - insulin   CATARACT EXTRACTION W/PHACO Right 06/03/2018   Procedure: CATARACT EXTRACTION PHACO AND INTRAOCULAR LENS PLACEMENT (IOC) RIGHT DIABETES;  Surgeon: Nevada Crane, MD;  Location: [facility];  Service: Ophthalmology;  Laterality: Right;  Diabetes-insulin dependent   CESAREAN SECTION     x2    CHOLECYSTECTOMY     EYE SURGERY     TOTAL ABDOMINAL HYSTERECTOMY W/ BILATERAL SALPINGOOPHORECTOMY       Social History:   reports that she quit smoking about 10 years ago. Her smoking use included cigarettes. She started smoking about 30 years ago. She has a 20 pack-year smoking history. She has never used smokeless tobacco. She reports that she does not drink alcohol and does not use drugs.   Family History:  Her family history includes Cancer in her brother and sister; Heart disease in her father. There is no history of Breast cancer.   Allergies Allergies  Allergen Reactions   Empagliflozin    Actos [  Pioglitazone] Other (See Comments)    CHF   Amlodipine Swelling    Leg swelling    Benazepril Other (See Comments)    angioedema   Hydrochlorothiazide Other (See Comments)    angioedema   Metformin And Related Diarrhea     Home Medications  Prior to Admission medications   Medication Sig Start Date End Date Taking? Authorizing Provider  acetaminophen (TYLENOL) 500 MG tablet Take 1,000 mg by mouth 3 (three) times daily.   Yes [provider]  albuterol (PROVENTIL) (2.5 MG/3ML) 0.083% nebulizer solution Take 2.5 mg by nebulization 4 (four) times daily.   Yes [provider]  amLODipine (NORVASC) 10 MG tablet Take 1 tablet (10 mg total) by mouth daily. 05/07/23  Yes Larae Grooms, NP  atenolol (TENORMIN) 100 MG tablet Take 1 tablet (100 mg total) by mouth daily. 05/07/23  Yes Larae Grooms, NP  atorvastatin (LIPITOR) 10 MG tablet Take 10 mg by mouth daily.   Yes [provider]  donepezil (ARICEPT) 5 MG tablet Take 5 mg by mouth at bedtime. 03/05/23 10/11/23 Yes [provider]  folic acid (FOLVITE) 400 MCG tablet Take 400 mcg by mouth daily.   Yes [provider]  gabapentin (NEURONTIN) 300 MG capsule Take 300 mg by mouth 3 (three) times daily.   Yes [provider]  insulin lispro (HUMALOG KWIKPEN) 100 UNIT/ML KwikPen  Inject 0-6 Units into the skin 4 (four) times daily -  before meals and at bedtime.   Yes [provider]  ipratropium (ATROVENT) 0.02 % nebulizer solution Take 0.5 mg by nebulization 4 (four) times daily.   Yes [provider]  lidocaine 4 % Place 1 patch onto the skin daily.   Yes [provider]  methocarbamol (ROBAXIN) 750 MG tablet Take 750 mg by mouth 4 (four) times daily.   Yes [provider]  polyethylene glycol (MIRALAX / GLYCOLAX) 17 g packet Take 17 g by mouth 2 (two) times daily.   Yes [provider]  senna (SENOKOT) 8.6 MG TABS tablet Take 2 tablets by mouth 2 (two) times daily.   Yes [provider]  sertraline (ZOLOFT) 25 MG tablet Take 1 tablet (25 mg total) by mouth daily. You can increase to 50 mg in 2 weeks if you are still having mood symptoms. 06/29/23  Yes Mecum, Erin E, PA-C  tamsulosin (FLOMAX) 0.4 MG CAPS capsule Take 0.4 mg by mouth at bedtime.   Yes [provider]  BD PEN NEEDLE MICRO U/F 32G X 6 MM MISC USE 4 TO 5 TIMES DAILY WITH INSULIN USE 10/24/21   Laural Benes, Megan P, DO  Blood Glucose Monitoring Suppl (ONE TOUCH ULTRA 2) w/Device KIT 1 each by Does not apply route 2 (two) times daily. 05/15/23   Larae Grooms, NP  Blood Glucose Monitoring Suppl (ONETOUCH VERIO) w/Device KIT Use to check blood sugar 2-3 times daily and document, bring results to provider visits. 06/12/22   Larae Grooms, NP  Leda Min 5-2.5-18.5 LF-MCG/0.5 injection  05/13/23   [provider]  glucose blood (ONETOUCH ULTRA) test strip 1 each by Other route 2 (two) times daily. Use as instructed 05/15/23   Larae Grooms, NP  glucose blood test strip Use to check blood sugar 2-3 times daily and document, bring results to provider visits. 05/23/22   Larae Grooms, NP  hydrALAZINE (APRESOLINE) 10 MG tablet Take 1 tablet (10 mg total) by mouth 2 (two) times daily. Patient not taking: Reported on 10/11/2023 05/07/23  Larae Grooms, NP  insulin degludec (TRESIBA FLEXTOUCH) 100 UNIT/ML FlexTouch Pen INJECT 40 UNITS SUBCUTANEOUSLY ONCE DAILY Patient not taking: Reported on 10/11/2023 05/02/23   Larae Grooms, NP  Lancets [facility] ULTRASOFT) lancets Use to check blood sugar 2-3 times daily and document, bring results to provider visits. 06/12/22   Larae Grooms, NP  Lancets [facility] ULTRASOFT) lancets 1 each by Other route 2 (two) times daily. Use as instructed 05/15/23   Larae Grooms, NP  lovastatin (MEVACOR) 40 MG tablet Take 1 tablet (40 mg total) by mouth daily with breakfast. Patient not taking: Reported on 10/11/2023 05/07/23   Larae Grooms, NP  OneTouch Delica Lancets 33G MISC 1 applicator by Does not apply route in the morning and at bedtime. 05/23/22   Larae Grooms, NP  Semaglutide,0.25 or 0.5MG /DOS, (OZEMPIC, 0.25 OR 0.5 MG/DOSE,) 2 MG/3ML SOPN Inject 0.25 mg into the skin once a week. Patient not taking: Reported on 10/11/2023 05/28/23   Larae Grooms, NP  spironolactone (ALDACTONE) 25 MG tablet Take 1 tablet (25 mg total) by mouth daily. 05/07/23   Larae Grooms, NP  VITAMIN D, CHOLECALCIFEROL, PO Take 5,000 Units by mouth daily. Patient not taking: Reported on 10/11/2023    [provider]     Critical care time: 50 minutes     Zada Girt, AGNP  Pulmonary/Critical Care Pager [phone] (please enter 7 digits) PCCM Consult Pager [phone] (please enter 7 digits)

## 2023-10-17 NOTE — Consult Note (Signed)
PHARMACY - ANTICOAGULATION CONSULT NOTE  Pharmacy Consult for heparin infusion Indication: pulmonary embolus  Allergies  Allergen Reactions   Empagliflozin    Actos [Pioglitazone] Other (See Comments)    CHF   Amlodipine Swelling    Leg swelling    Benazepril Other (See Comments)    angioedema   Hydrochlorothiazide Other (See Comments)    angioedema   Metformin And Related Diarrhea    Patient Measurements: Height: 5\' 1"  (154.9 cm) Weight: 78 kg (171 lb 15.3 oz) IBW/kg (Calculated) : 47.8 Heparin Dosing Weight: 65.2 kg  Vital Signs: Temp: 98.6 F (37 C) (10/30 0000) Temp Source: Axillary (10/30 0000) BP: 147/63 (10/30 0300) Pulse Rate: 70 (10/30 0330)  Labs: Recent Labs    10/15/23 0457 10/16/23 0426 10/16/23 1147 10/16/23 1637 10/16/23 1939 10/17/23 0255  HGB 12.1  --  12.9  --   --  12.2  HCT 40.9  --  44.7  --   --  41.7  PLT 149*  --  152  --   --  141*  APTT  --   --   --   --  27  --   LABPROT  --   --   --   --  14.1  --   INR  --   --   --   --  1.1  --   HEPARINUNFRC  --   --   --   --   --  0.51  CREATININE 0.36* 0.56 0.60  --   --   --   TROPONINIHS  --   --  46* 49*  --   --     Estimated Creatinine Clearance: 56.6 mL/min (by C-G formula based on SCr of 0.6 mg/dL).   Medical History: Past Medical History:  Diagnosis Date   Arthritis    knee   COPD (chronic obstructive pulmonary disease) (HCC)    Diabetes mellitus without complication (HCC)    type 2   Dyspnea    Hyperlipidemia    Hypertension    controlled   Neuromuscular disorder (HCC)    numbness in hands and feet   Shoulder pain, right    Wears dentures    upper and lower    Medications:  Patient was on enoxaparin 40 mg SubQ q24h, last dose 10/28 @ 2128  Assessment: 77 yo female presented to [facility] due to acute respiratory failure. PMG includes T2DM, HFpEF, and dementia.  Pharmacy consulted to start heparin infusion for possible PE.   Goal of Therapy:  Heparin level 0.3-0.7  units/ml Monitor platelets by anticoagulation protocol: Yes   10/30 0255 HL 0.51, therapeutic x 1  Plan: Continue heparin infusion at 1050 units/hr Recheck HL in 8 hrs to confirm CBC daily while on heparin  Otelia Sergeant, PharmD, [facility] 10/17/2023 3:50 AM

## 2023-10-17 NOTE — Consult Note (Addendum)
Consultation Note Date: 10/17/2023   Patient Name: Tami Cummings  DOB: [birth year]  MRN: [id number]  Age / Sex: 77 y.o., female  PCP: Tami Grooms, NP Referring Physician: Raechel Chute, MD  Reason for Consultation: Establishing goals of care  HPI/Patient Profile: Tami Cummings is a 77 y.o. female with medical history significant of DM2, dementia who presented from [facility] for concern for possible PNA.    Patient was admitted for dyspnea and hypoxia. The patient was recently hospitalized (08/31/23 - 09/10/23) for wound dehiscence after back surgery of T9-T12 fusion (done 07/30/23) following an injury that occurred in July of this year. After a washout she was discharged to a SNF with a PICC line for Vancomycin and Rocephin scheduled to d/c abx 10/11/23.  Clinical Assessment and Goals of Care: Notes and labs reviewed.  Into see patient.  She is currently resting in bed on ventilator support.  Patient's daughter is sitting at bedside.  Patient's daughter advises that patient was married to a man that was Acupuncturist, and was later divorced.  She advises that her mother never remarried.  She states later in life, she had her and her half brother.   She states prior to patient's fall necessitating her for spine surgery, the patient lived alone at home.  She states once in a while she would forget how to use the telephone, or confuse it with the remote.  She advises that after the surgery she became more confused and began asking for her parents.  Tami Cummings states after the patient's second surgery patient was able to remember names and recognize faces, but sometimes could not put the faces and names together.   She states physically at baseline prior to this admission patient was able to get up and walk a few feet.  She states she was able to feed herself, and to participate with arts and  crafts at her facility.  She states they were transitioning to long-term care from skilled nursing.  Son entered into conversation.  Recapped the previous information discussed.  Introduced goals of care conversations.  Daughter states her mother was very private and they do not know how she felt about quality of life or care that would be out of bounds for her.  Daughter states that she would like a few days to see how patient does before making any decisions including CODE STATUS.  Son states he is in agreement with his daughter at this time.  SUMMARY OF RECOMMENDATIONS   PMT will follow-up  Prognosis:  Unable to determine       Primary Diagnoses: Present on Admission:  Hypoxia   I have reviewed the medical record, interviewed the patient and family, and examined the patient. The following aspects are pertinent.  Past Medical History:  Diagnosis Date   Arthritis    knee   COPD (chronic obstructive pulmonary disease) (HCC)    Diabetes mellitus without complication (HCC)    type 2   Dyspnea    Hyperlipidemia    Hypertension  controlled   Neuromuscular disorder (HCC)    numbness in hands and feet   Shoulder pain, right    Wears dentures    upper and lower   Social History   Socioeconomic History   Marital status: Single    Spouse name: Not on file   Number of children: Not on file   Years of education: Not on file   Highest education level: High school graduate  Occupational History   Occupation: retired   Tobacco Use   Smoking status: Former    Current packs/day: 0.00    Average packs/day: 1 pack/day for 20.0 years (20.0 ttl pk-yrs)    Types: Cigarettes    Start date: 08/26/1993    Quit date: 08/26/2013    Years since quitting: 10.1   Smokeless tobacco: Never  Vaping Use   Vaping status: Never Used  Substance and Sexual Activity   Alcohol use: No    Alcohol/week: 0.0 standard drinks of alcohol   Drug use: No   Sexual activity: Not Currently  Other Topics  Concern   Not on file  Social History Narrative   Daughter lives with her currently    Takes care of granddaughter    Social Determinants of Health   Financial Resource Strain: Low Risk  (09/03/2023)   Received from [facility]   Overall Financial Resource Strain (CARDIA)    Difficulty of Paying Living Expenses: Not hard at all  Food Insecurity: No Food Insecurity (10/12/2023)   Hunger Vital Sign    Worried About Running Out of Food in the Last Year: Never true    Ran Out of Food in the Last Year: Never true  Transportation Needs: No Transportation Needs (10/12/2023)   PRAPARE - Administrator, Civil Service (Medical): No    Lack of Transportation (Non-Medical): No  Physical Activity: Inactive (08/08/2022)   Exercise Vital Sign    Days of Exercise per Week: 0 days    Minutes of Exercise per Session: 0 min  Stress: No Stress Concern Present (08/08/2022)   [employer] of Occupational Health - Occupational Stress Questionnaire    Feeling of Stress : Not at all  Social Connections: Unknown (08/08/2022)   Social Connection and Isolation Panel [NHANES]    Frequency of Communication with Friends and Family: Twice a week    Frequency of Social Gatherings with Friends and Family: Once a week    Attends Religious Services: Never    Database administrator or Organizations: No    Attends Banker Meetings: Never    Marital Status: Not on file  Recent Concern: Social Connections - Socially Isolated (08/08/2022)   Social Connection and Isolation Panel [NHANES]    Frequency of Communication with Friends and Family: Twice a week    Frequency of Social Gatherings with Friends and Family: Once a week    Attends Religious Services: Never    Database administrator or Organizations: No    Attends Engineer, structural: Never    Marital Status: Divorced   Family History  Problem Relation Age of Onset   Heart disease Father    Cancer Sister    Cancer  Brother    Breast cancer Neg Hx    Scheduled Meds:  budesonide (PULMICORT) nebulizer solution  0.5 mg Nebulization BID   Chlorhexidine Gluconate Cloth  6 each Topical Daily   docusate  100 mg Per Tube BID   enoxaparin (LOVENOX) injection  40 mg Subcutaneous  Q24H   feeding supplement (PROSource TF20)  60 mL Per Tube QID   feeding supplement (VITAL 1.5 CAL)  1,000 mL Per Tube Q24H   free water  160 mL Per Tube Q6H   hydrocortisone sod succinate (SOLU-CORTEF) inj  50 mg Intravenous Q6H   insulin aspart  0-15 Units Subcutaneous Q4H   ipratropium-albuterol  3 mL Nebulization Q6H   mouth rinse  15 mL Mouth Rinse Q2H   pantoprazole (PROTONIX) IV  40 mg Intravenous QHS   polyethylene glycol  17 g Per Tube Daily   QUEtiapine  100 mg Per Tube QHS   sodium chloride flush  10-40 mL Intracatheter Q12H   Continuous Infusions:  norepinephrine (LEVOPHED) Adult infusion 1 mcg/min (10/17/23 1400)   piperacillin-tazobactam (ZOSYN)  IV 12.5 mL/hr at 10/17/23 1400   propofol (DIPRIVAN) infusion 35 mcg/kg/min (10/17/23 1400)   PRN Meds:.atropine, fentaNYL, ondansetron (ZOFRAN) IV, ondansetron, mouth rinse, sodium chloride flush Medications Prior to Admission:  Prior to Admission medications   Medication Sig Start Date End Date Taking? Authorizing Provider  acetaminophen (TYLENOL) 500 MG tablet Take 1,000 mg by mouth 3 (three) times daily.   Yes [provider]  albuterol (PROVENTIL) (2.5 MG/3ML) 0.083% nebulizer solution Take 2.5 mg by nebulization 4 (four) times daily.   Yes [provider]  amLODipine (NORVASC) 10 MG tablet Take 1 tablet (10 mg total) by mouth daily. 05/07/23  Yes Tami Grooms, NP  atenolol (TENORMIN) 100 MG tablet Take 1 tablet (100 mg total) by mouth daily. 05/07/23  Yes Tami Grooms, NP  atorvastatin (LIPITOR) 10 MG tablet Take 10 mg by mouth daily.   Yes [provider]  donepezil (ARICEPT) 5 MG tablet Take 5 mg by mouth at bedtime. 03/05/23  10/11/23 Yes [provider]  folic acid (FOLVITE) 400 MCG tablet Take 400 mcg by mouth daily.   Yes [provider]  gabapentin (NEURONTIN) 300 MG capsule Take 300 mg by mouth 3 (three) times daily.   Yes [provider]  insulin lispro (HUMALOG KWIKPEN) 100 UNIT/ML KwikPen Inject 0-6 Units into the skin 4 (four) times daily -  before meals and at bedtime.   Yes [provider]  ipratropium (ATROVENT) 0.02 % nebulizer solution Take 0.5 mg by nebulization 4 (four) times daily.   Yes [provider]  lidocaine 4 % Place 1 patch onto the skin daily.   Yes [provider]  methocarbamol (ROBAXIN) 750 MG tablet Take 750 mg by mouth 4 (four) times daily.   Yes [provider]  polyethylene glycol (MIRALAX / GLYCOLAX) 17 g packet Take 17 g by mouth 2 (two) times daily.   Yes [provider]  senna (SENOKOT) 8.6 MG TABS tablet Take 2 tablets by mouth 2 (two) times daily.   Yes [provider]  sertraline (ZOLOFT) 25 MG tablet Take 1 tablet (25 mg total) by mouth daily. You can increase to 50 mg in 2 weeks if you are still having mood symptoms. 06/29/23  Yes Mecum, Erin E, PA-C  tamsulosin (FLOMAX) 0.4 MG CAPS capsule Take 0.4 mg by mouth at bedtime.   Yes [provider]  BD PEN NEEDLE MICRO U/F 32G X 6 MM MISC USE 4 TO 5 TIMES DAILY WITH INSULIN USE 10/24/21   Laural Benes, Megan P, DO  Blood Glucose Monitoring Suppl (ONE TOUCH ULTRA 2) w/Device KIT 1 each by Does not apply route 2 (two) times daily. 05/15/23   Tami Grooms, NP  Blood Glucose Monitoring Suppl (  ONETOUCH VERIO) w/Device KIT Use to check blood sugar 2-3 times daily and document, bring results to provider visits. 06/12/22   Tami Grooms, NP  Leda Min 5-2.5-18.5 LF-MCG/0.5 injection  05/13/23   [provider]  glucose blood (ONETOUCH ULTRA) test strip 1 each by Other route 2 (two) times daily. Use as instructed 05/15/23   Tami Grooms, NP   glucose blood test strip Use to check blood sugar 2-3 times daily and document, bring results to provider visits. 05/23/22   Tami Grooms, NP  hydrALAZINE (APRESOLINE) 10 MG tablet Take 1 tablet (10 mg total) by mouth 2 (two) times daily. Patient not taking: Reported on 10/11/2023 05/07/23   Tami Grooms, NP  insulin degludec (TRESIBA FLEXTOUCH) 100 UNIT/ML FlexTouch Pen INJECT 40 UNITS SUBCUTANEOUSLY ONCE DAILY Patient not taking: Reported on 10/11/2023 05/02/23   Tami Grooms, NP  Lancets [facility] ULTRASOFT) lancets Use to check blood sugar 2-3 times daily and document, bring results to provider visits. 06/12/22   Tami Grooms, NP  Lancets [facility] ULTRASOFT) lancets 1 each by Other route 2 (two) times daily. Use as instructed 05/15/23   Tami Grooms, NP  lovastatin (MEVACOR) 40 MG tablet Take 1 tablet (40 mg total) by mouth daily with breakfast. Patient not taking: Reported on 10/11/2023 05/07/23   Tami Grooms, NP  OneTouch Delica Lancets 33G MISC 1 applicator by Does not apply route in the morning and at bedtime. 05/23/22   Tami Grooms, NP  Semaglutide,0.25 or 0.5MG /DOS, (OZEMPIC, 0.25 OR 0.5 MG/DOSE,) 2 MG/3ML SOPN Inject 0.25 mg into the skin once a week. Patient not taking: Reported on 10/11/2023 05/28/23   Tami Grooms, NP  spironolactone (ALDACTONE) 25 MG tablet Take 1 tablet (25 mg total) by mouth daily. 05/07/23   Tami Grooms, NP  VITAMIN D, CHOLECALCIFEROL, PO Take 5,000 Units by mouth daily. Patient not taking: Reported on 10/11/2023    [provider]   Allergies  Allergen Reactions   Empagliflozin    Actos [Pioglitazone] Other (See Comments)    CHF   Amlodipine Swelling    Leg swelling    Benazepril Other (See Comments)    angioedema   Hydrochlorothiazide Other (See Comments)    angioedema   Metformin And Related Diarrhea   Review of Systems  Unable to perform ROS   Physical Exam Constitutional:      Comments: Eyes  closed.  On ventilator.  Sedation in place.     Vital Signs: BP (!) 114/53   Pulse (!) 59   Temp 98.2 F (36.8 C) (Axillary)   Resp 14   Ht 5\' 1"  (1.549 m)   Wt 78 kg   SpO2 94%   BMI 32.49 kg/m  Pain Scale: CPOT   Pain Score: 0-No pain   SpO2: SpO2: 94 % O2 Device:SpO2: 94 % O2 Flow Rate: .O2 Flow Rate (L/min): 50 L/min  IO: Intake/output summary:  Intake/Output Summary (Last 24 hours) at 10/17/2023 1627 Last data filed at 10/17/2023 1400 Gross per 24 hour  Intake 2083.64 ml  Output 555 ml  Net 1528.64 ml    LBM: Last BM Date : 10/14/23 Baseline Weight: Weight: 78 kg Most recent weight: Weight: 78 kg      Morton Stall, NP   Please contact Palliative Medicine Team phone at [phone] for questions and concerns.  For individual provider: See Loretha Stapler

## 2023-10-17 NOTE — Progress Notes (Signed)
*  PRELIMINARY RESULTS* Echocardiogram 2D Echocardiogram has been performed.  Carolyne Fiscal 10/17/2023, 2:00 PM

## 2023-10-17 NOTE — Progress Notes (Signed)
Initial Nutrition Assessment  DOCUMENTATION CODES:   Obesity unspecified  INTERVENTION:   -TF via OGT:   Initiate Vital 1.5 @ 20 ml/hr   60 ml Prosource TF 4 times daily    160 ml free water flush every 6 hours  Tube feeding regimen provides 1040 kcal (100% of needs), 112 grams of protein, and 367 ml of H2O.  Total free water: 1040 ml daily  NUTRITION DIAGNOSIS:   Inadequate oral intake related to inability to eat as evidenced by NPO status.  GOAL:   Provide needs based on ASPEN/SCCM guidelines  MONITOR:   Vent status, TF tolerance  REASON FOR ASSESSMENT:   Ventilator, Consult Enteral/tube feeding initiation and management  ASSESSMENT:   Pt with history of dementia, diabetes, and recent T9-T-12 fusion complicated by wound dehiscence and infection s/p revision in September who presents to the hospital with increased shortness of breath concerning for pneumonia  Pt admitted with possible pneumonia.   10/29- rapid response called, intubated  Patient is currently intubated on ventilator support. OGT currently clamped. KUB on 10/16/23 reveals tips of tube and side port in stomach.  MV: 4.7 L/min Temp (24hrs), Avg:98.6 F (37 C), Min:98.2 F (36.8 C), Max:98.9 F (37.2 C)  Propofol: 7 ml/hr (provided 185 kcals daily)  Pt is a resident of [facility].   Case discussed with RN, MD, during ICU rounds. Pt able to follow commands. She was being followed by outpatient neurology [facility] secondary to dementia. Per MD notes, plan for CT of chest and head today as well as diuresis (questionable CHF). Family meeting planned for this afternoon for possible chest tube placement and goals of care.   RD received permission to start TF today.   Reviewed wt hx; pt has experienced a 4.1% wt loss over the past 7 months, which is not significant for time frame.   Medications reviewed and include colace, lovenox, solu-cortef, protonix, miralax, levophed, zosyn, and propofol.    Lab Results  Component Value Date   HGBA1C 6.2 (H) 10/11/2023   PTA DM medications are 0-6 units insulin lispro 4 times daily.   Labs reviewed: CBGS: 165 (inpatient orders for glycemic control are 0-15 units insulin aspart every 4 hours).    NUTRITION - FOCUSED PHYSICAL EXAM:  Flowsheet Row Most Recent Value  Orbital Region Mild depletion  Upper Arm Region No depletion  Thoracic and Lumbar Region No depletion  Buccal Region No depletion  Temple Region Mild depletion  Clavicle Bone Region No depletion  Clavicle and Acromion Bone Region No depletion  Scapular Bone Region No depletion  Dorsal Hand No depletion  Patellar Region Mild depletion  Anterior Thigh Region Mild depletion  Posterior Calf Region Mild depletion  Edema (RD Assessment) Mild  Hair Reviewed  Eyes Reviewed  Mouth Reviewed  Skin Reviewed  Nails Reviewed       Diet Order:   Diet Order             Diet NPO time specified  Diet effective now                   EDUCATION NEEDS:   Not appropriate for education at this time  Skin:  Skin Assessment: Reviewed RN Assessment  Last BM:  10/14/23  Height:   Ht Readings from Last 1 Encounters:  10/16/23 5\' 1"  (1.549 m)    Weight:   Wt Readings from Last 1 Encounters:  10/11/23 78 kg    Ideal Body Weight:  47.7 kg  BMI:  Body mass index is 32.49 kg/m.  Estimated Nutritional Needs:   Kcal:  841-3244  Protein:  > 95 grams  Fluid:  1-1.2 L    Levada Schilling, RD, LDN, CDCES Registered Dietitian III Certified Diabetes Care and Education Specialist Please refer to [facility] for RD and/or RD on-call/weekend/after hours pager

## 2023-10-17 NOTE — Consult Note (Addendum)
Pharmacy Antibiotic Note  Tami Cummings is a 77 y.o. female admitted on 10/11/2023 with acute hypoxic respiratory failure in the setting of acute decompensated HFpEF and suspected pneumonia. Pharmacy has been consulted for vancomycin and Zosyn dosing.  Addendum  MRSA nares negative so will not start vancomycin maintenance dosing   Today, 10/17/2023 Day 2 of antibiotics; Day 1 of Vanc/Zosyn  WBC WNL Afebrile  Scr stable at baseline with estimated CrCl of 56.6 mL/min   Plan Discontinue Unasyn to broaden coverage  Give vancomycin 1750 mg x 1 as a loading dose Will f/u MRSA nares to determine if maintenance regimen is required  Start Zosyn 3.375 gm Q8H based on renal function  Pharmacy will continue to follow and dose adjust appropriately   Height: 5\' 1"  (154.9 cm) Weight: 78 kg (171 lb 15.3 oz) IBW/kg (Calculated) : 47.8  Temp (24hrs), Avg:98.7 F (37.1 C), Min:98.5 F (36.9 C), Max:98.9 F (37.2 C)  Recent Labs  Lab 10/13/23 0530 10/14/23 0515 10/15/23 0457 10/16/23 0426 10/16/23 1147 10/16/23 1637 10/17/23 0255 10/17/23 0453  WBC 4.6 5.2 6.2  --  7.5  --  9.0  --   CREATININE 0.40* 0.33* 0.36* 0.56 0.60  --   --  0.79  LATICACIDVEN  --   --   --   --  2.8* 1.2  --   --     Estimated Creatinine Clearance: 56.6 mL/min (by C-G formula based on SCr of 0.79 mg/dL).    Allergies  Allergen Reactions   Empagliflozin    Actos [Pioglitazone] Other (See Comments)    CHF   Amlodipine Swelling    Leg swelling    Benazepril Other (See Comments)    angioedema   Hydrochlorothiazide Other (See Comments)    angioedema   Metformin And Related Diarrhea   Antimicrobials this admission: 10/29 Cefepime, vanc, azithro x 1 10/29 Unasyn >>  10/30 Vanc >> 10/30 Zosyn >>   Dose adjustments this admission: N/A  Microbiology results: 10/24 BCx: NGTD 10/25 MRSA nares: neg 10/29 Resp cx: pending  10/30 Repeat BCx: sent 10/30 Repeat MRSA nares: pending   Thank you for  allowing pharmacy to be a part of this patient's care.  Littie Deeds, PharmD Pharmacy Resident  10/17/2023 10:41 AM

## 2023-10-17 NOTE — Procedures (Signed)
Insertion of Chest Tube Procedure Note  Gemini Salmeron  [id number]  Nov 20, 1946  Date:10/17/23  Time:6:09 PM    Provider Performing: Lianne Bushy Ardian Haberland   Procedure: Pleural Catheter Insertion w/ Imaging Guidance (40981)  Indication(s) Effusion  Consent Risks of the procedure as well as the alternatives and risks of each were explained to the patient and/or caregiver.  Consent for the procedure was obtained and is signed in the bedside chart  Anesthesia Topical only with 1% lidocaine    Time Out Verified patient identification, verified procedure, site/side was marked, verified correct patient position, special equipment/implants available, medications/allergies/relevant history reviewed, required imaging and test results available.   Sterile Technique Maximal sterile technique including full sterile barrier drape, hand hygiene, sterile gown, sterile gloves, mask, hair covering, sterile ultrasound probe cover (if used).   Procedure Description Ultrasound used to identify appropriate pleural anatomy for placement and overlying skin marked. Area of placement cleaned and draped in sterile fashion.  A 14 French pigtail pleural catheter was placed into the right pleural space using Seldinger technique. Appropriate return of fluid was obtained. 750 mL of serous fluid immediately drained.  The tube was connected to atrium and placed on -20 cm H2O wall suction.   Complications/Tolerance None; patient tolerated the procedure well. Chest X-ray is ordered to verify placement.   EBL Minimal  Specimen(s) none  Raechel Chute, MD [facility] 10/17/2023 6:09 PM

## 2023-10-17 NOTE — Plan of Care (Signed)
  Problem: Nutritional: Goal: Maintenance of adequate nutrition will improve Outcome: Progressing   Problem: Nutrition: Goal: Adequate nutrition will be maintained Outcome: Progressing   Problem: Coping: Goal: Level of anxiety will decrease Outcome: Progressing   Problem: Respiratory: Goal: Ability to maintain adequate ventilation will improve Outcome: Progressing

## 2023-10-17 NOTE — Consult Note (Signed)
PHARMACY - ANTICOAGULATION CONSULT NOTE  Pharmacy Consult for heparin infusion Indication: DVT  Allergies  Allergen Reactions   Empagliflozin    Actos [Pioglitazone] Other (See Comments)    CHF   Amlodipine Swelling    Leg swelling    Benazepril Other (See Comments)    angioedema   Hydrochlorothiazide Other (See Comments)    angioedema   Metformin And Related Diarrhea    Patient Measurements: Height: 5\' 1"  (154.9 cm) Weight: 78 kg (171 lb 15.3 oz) IBW/kg (Calculated) : 47.8 Heparin Dosing Weight: 65.2 kg  Vital Signs: Temp: 98.5 F (36.9 C) (10/30 2000) Temp Source: Axillary (10/30 2000) BP: 143/56 (10/30 2000) Pulse Rate: 65 (10/30 2000)  Labs: Recent Labs    10/15/23 0457 10/16/23 0426 10/16/23 1147 10/16/23 1637 10/16/23 1939 10/17/23 0255 10/17/23 0453 10/17/23 1207 10/17/23 1502 10/17/23 1710 10/17/23 1822  HGB 12.1  --  12.9  --   --  12.2  --   --   --   --   --   HCT 40.9  --  44.7  --   --  41.7  --   --   --   --  32.2*  PLT 149*  --  152  --   --  141*  --   --   --   --   --   APTT  --   --   --   --  27  --   --   --   --   --   --   LABPROT  --   --   --   --  14.1  --   --   --   --   --   --   INR  --   --   --   --  1.1  --   --   --   --   --   --   HEPARINUNFRC  --   --   --   --   --  0.51  --  0.61  --   --   --   CREATININE 0.36*   < > 0.60  --   --   --  0.79 0.77  --   --   --   TROPONINIHS  --   --  46*   < >  --   --   --  50* 60* 60*  --    < > = values in this interval not displayed.    Estimated Creatinine Clearance: 56.6 mL/min (by C-G formula based on SCr of 0.77 mg/dL).   Medical History: Past Medical History:  Diagnosis Date   Arthritis    knee   COPD (chronic obstructive pulmonary disease) (HCC)    Diabetes mellitus without complication (HCC)    type 2   Dyspnea    Hyperlipidemia    Hypertension    controlled   Neuromuscular disorder (HCC)    numbness in hands and feet   Shoulder pain, right    Wears  dentures    upper and lower    Medications:  Patient was on enoxaparin 40 mg SubQ q24h, last dose 10/28 @ 2128  Assessment: 77 yo female presented to [facility] due to acute respiratory failure. PMG includes T2DM, HFpEF, and dementia.  Pharmacy consulted to start heparin infusion for possible PE.   Goal of Therapy:  Heparin level 0.3-0.7 units/ml Monitor platelets by anticoagulation protocol: Yes   10/30 0255 HL 0.51, therapeutic  x 1  Plan: Patient got enoxaparin 40 mg SubQ 10/30 @ 1708 Resume heparin infusion at 1050 units/hr with no bolus per provider Check HL in 8 hours CBC daily while on heparin  Barrie Folk, PharmD 10/17/2023 9:09 PM

## 2023-10-18 DIAGNOSIS — I5033 Acute on chronic diastolic (congestive) heart failure: Secondary | ICD-10-CM

## 2023-10-18 DIAGNOSIS — R0902 Hypoxemia: Secondary | ICD-10-CM | POA: Diagnosis not present

## 2023-10-18 DIAGNOSIS — I82469 Acute embolism and thrombosis of unspecified calf muscular vein: Secondary | ICD-10-CM | POA: Diagnosis not present

## 2023-10-18 DIAGNOSIS — J189 Pneumonia, unspecified organism: Secondary | ICD-10-CM | POA: Diagnosis not present

## 2023-10-18 DIAGNOSIS — Z7189 Other specified counseling: Secondary | ICD-10-CM | POA: Diagnosis not present

## 2023-10-18 DIAGNOSIS — J9621 Acute and chronic respiratory failure with hypoxia: Secondary | ICD-10-CM | POA: Diagnosis not present

## 2023-10-18 DIAGNOSIS — J90 Pleural effusion, not elsewhere classified: Secondary | ICD-10-CM

## 2023-10-18 LAB — GLUCOSE, CAPILLARY
Glucose-Capillary: 138 mg/dL — ABNORMAL HIGH (ref 70–99)
Glucose-Capillary: 152 mg/dL — ABNORMAL HIGH (ref 70–99)
Glucose-Capillary: 183 mg/dL — ABNORMAL HIGH (ref 70–99)
Glucose-Capillary: 204 mg/dL — ABNORMAL HIGH (ref 70–99)
Glucose-Capillary: 250 mg/dL — ABNORMAL HIGH (ref 70–99)
Glucose-Capillary: 252 mg/dL — ABNORMAL HIGH (ref 70–99)
Glucose-Capillary: 266 mg/dL — ABNORMAL HIGH (ref 70–99)

## 2023-10-18 LAB — BLOOD GAS, ARTERIAL
Acid-Base Excess: 15.5 mmol/L — ABNORMAL HIGH (ref 0.0–2.0)
Bicarbonate: 42.8 mmol/L — ABNORMAL HIGH (ref 20.0–28.0)
FIO2: 40 %
MECHVT: 400 mL
O2 Saturation: 99 %
PEEP: 8 cmH2O
Patient temperature: 37
RATE: 12 {breaths}/min
pCO2 arterial: 63 mm[Hg] — ABNORMAL HIGH (ref 32–48)
pH, Arterial: 7.44 (ref 7.35–7.45)
pO2, Arterial: 105 mm[Hg] (ref 83–108)

## 2023-10-18 LAB — RENAL FUNCTION PANEL
Albumin: 2.4 g/dL — ABNORMAL LOW (ref 3.5–5.0)
Anion gap: 10 (ref 5–15)
BUN: 40 mg/dL — ABNORMAL HIGH (ref 8–23)
CO2: 36 mmol/L — ABNORMAL HIGH (ref 22–32)
Calcium: 8 mg/dL — ABNORMAL LOW (ref 8.9–10.3)
Chloride: 95 mmol/L — ABNORMAL LOW (ref 98–111)
Creatinine, Ser: 0.71 mg/dL (ref 0.44–1.00)
GFR, Estimated: >60 mL/min (ref >60)
Glucose, Bld: 171 mg/dL — ABNORMAL HIGH (ref 70–99)
Phosphorus: 3.9 mg/dL (ref 2.5–4.6)
Potassium: 2.8 mmol/L — ABNORMAL LOW (ref 3.5–5.1)
Sodium: 141 mmol/L (ref 135–145)

## 2023-10-18 LAB — CBC
HCT: 33.3 % — ABNORMAL LOW (ref 36.0–46.0)
Hemoglobin: 9.9 g/dL — ABNORMAL LOW (ref 12.0–15.0)
MCH: 25.5 pg — ABNORMAL LOW (ref 26.0–34.0)
MCHC: 29.7 g/dL — ABNORMAL LOW (ref 30.0–36.0)
MCV: 85.8 fL (ref 80.0–100.0)
Platelets: 106 10*3/uL — ABNORMAL LOW (ref 150–400)
RBC: 3.88 MIL/uL (ref 3.87–5.11)
RDW: 14.6 % (ref 11.5–15.5)
WBC: 4.6 10*3/uL (ref 4.0–10.5)
nRBC: 0 % (ref 0.0–0.2)

## 2023-10-18 LAB — POTASSIUM
Potassium: 6.9 mmol/L (ref 3.5–5.1)
Potassium: 3.4 mmol/L — ABNORMAL LOW (ref 3.5–5.1)
Potassium: 3.4 mmol/L — ABNORMAL LOW (ref 3.5–5.1)

## 2023-10-18 LAB — HEPARIN LEVEL (UNFRACTIONATED)
Heparin Unfractionated: >1.1 [IU]/mL — ABNORMAL HIGH (ref 0.30–0.70)
Heparin Unfractionated: 0.24 [IU]/mL — ABNORMAL LOW (ref 0.30–0.70)

## 2023-10-18 LAB — MAGNESIUM: Magnesium: 2.1 mg/dL (ref 1.7–2.4)

## 2023-10-18 LAB — TRIGLYCERIDES: Triglycerides: 406 mg/dL — ABNORMAL HIGH (ref <150)

## 2023-10-18 LAB — PROCALCITONIN: Procalcitonin: 0.57 ng/mL

## 2023-10-18 LAB — PATHOLOGIST SMEAR REVIEW

## 2023-10-18 MED ORDER — POTASSIUM CHLORIDE CRYS ER 20 MEQ PO TBCR: 40.0000 meq | EXTENDED_RELEASE_TABLET | Freq: Once | ORAL | Status: DC

## 2023-10-18 MED ORDER — ADULT MULTIVITAMIN LIQUID CH
15.0000 mL | Freq: Every day | ORAL | Status: DC
  Administered 2023-10-19: 15 mL
  Filled 2023-10-18 (×2): qty 15

## 2023-10-18 MED ORDER — INSULIN ASPART 100 UNIT/ML IJ SOLN
0.0000 [IU] | INTRAMUSCULAR | Status: DC
Start: 2023-10-18 — End: 2023-10-22
  Administered 2023-10-18 (×2): 7 [IU] via SUBCUTANEOUS
  Administered 2023-10-19: 15 [IU] via SUBCUTANEOUS
  Administered 2023-10-19 (×2): 7 [IU] via SUBCUTANEOUS
  Administered 2023-10-20 – 2023-10-21 (×2): 3 [IU] via SUBCUTANEOUS
  Administered 2023-10-21: 4 [IU] via SUBCUTANEOUS
  Administered 2023-10-21 – 2023-10-22 (×2): 3 [IU] via SUBCUTANEOUS
  Filled 2023-10-18 (×14): qty 1

## 2023-10-18 MED ORDER — FENTANYL CITRATE PF 50 MCG/ML IJ SOSY
25.0000 ug | PREFILLED_SYRINGE | INTRAMUSCULAR | Status: DC | PRN
  Administered 2023-10-19: 100 ug via INTRAVENOUS
  Administered 2023-10-19: 50 ug via INTRAVENOUS
  Administered 2023-10-20: 100 ug via INTRAVENOUS
  Administered 2023-10-20: 50 ug via INTRAVENOUS
  Filled 2023-10-18: qty 1
  Filled 2023-10-18 (×2): qty 2
  Filled 2023-10-18: qty 1

## 2023-10-18 MED ORDER — DEXMEDETOMIDINE HCL IN NACL 400 MCG/100ML IV SOLN
0.0000 ug/kg/h | INTRAVENOUS | Status: DC
  Administered 2023-10-18 (×2): 1.2 ug/kg/h via INTRAVENOUS
  Administered 2023-10-18: 0.4 ug/kg/h via INTRAVENOUS
  Administered 2023-10-18 – 2023-10-19 (×3): 1.2 ug/kg/h via INTRAVENOUS
  Filled 2023-10-18 (×7): qty 100

## 2023-10-18 MED ORDER — FREE WATER
100.0000 mL | Status: DC
  Administered 2023-10-18 – 2023-10-19 (×5): 100 mL

## 2023-10-18 MED ORDER — POTASSIUM CHLORIDE 20 MEQ PO PACK
40.0000 meq | PACK | Freq: Once | ORAL | Status: AC
  Administered 2023-10-18: 40 meq
  Filled 2023-10-18: qty 2

## 2023-10-18 MED ORDER — POTASSIUM CHLORIDE 10 MEQ/50ML IV SOLN
10.0000 meq | INTRAVENOUS | Status: AC
Start: 2023-10-18 — End: 2023-10-18
  Administered 2023-10-18 (×4): 10 meq via INTRAVENOUS
  Filled 2023-10-18 (×4): qty 50

## 2023-10-18 MED ORDER — FENTANYL CITRATE PF 50 MCG/ML IJ SOSY
25.0000 ug | PREFILLED_SYRINGE | INTRAMUSCULAR | Status: DC | PRN
  Administered 2023-10-18: 25 ug via INTRAVENOUS
  Filled 2023-10-18: qty 1

## 2023-10-18 MED ORDER — HEPARIN (PORCINE) 25000 UT/250ML-% IV SOLN
1050.0000 [IU]/h | INTRAVENOUS | Status: DC
  Administered 2023-10-19: 1050 [IU]/h via INTRAVENOUS
  Filled 2023-10-18: qty 250

## 2023-10-18 MED ORDER — DOCUSATE SODIUM 50 MG/5ML PO LIQD: 100.0000 mg | Freq: Two times a day (BID) | ORAL | Status: DC

## 2023-10-18 MED ORDER — FENTANYL CITRATE PF 50 MCG/ML IJ SOSY
25.0000 ug | PREFILLED_SYRINGE | INTRAMUSCULAR | Status: DC | PRN
Start: 2023-10-18 — End: 2023-10-20
  Administered 2023-10-18 – 2023-10-19 (×3): 50 ug via INTRAVENOUS
  Administered 2023-10-19: 100 ug via INTRAVENOUS
  Administered 2023-10-19: 50 ug via INTRAVENOUS
  Filled 2023-10-18 (×4): qty 1
  Filled 2023-10-18: qty 2

## 2023-10-18 MED ORDER — VITAL AF 1.2 CAL PO LIQD
1000.0000 mL | ORAL | Status: DC
  Administered 2023-10-18: 1000 mL

## 2023-10-18 MED ORDER — FUROSEMIDE 10 MG/ML IJ SOLN
60.0000 mg | INTRAMUSCULAR | Status: AC
  Administered 2023-10-18: 60 mg via INTRAVENOUS
  Filled 2023-10-18: qty 6

## 2023-10-18 MED ORDER — POTASSIUM CHLORIDE 20 MEQ PO PACK
20.0000 meq | PACK | Freq: Once | ORAL | Status: AC
  Administered 2023-10-18: 20 meq
  Filled 2023-10-18: qty 1

## 2023-10-18 MED ORDER — PROSOURCE TF20 ENFIT COMPATIBL EN LIQD
60.0000 mL | Freq: Two times a day (BID) | ENTERAL | Status: DC
  Administered 2023-10-18 – 2023-10-19 (×2): 60 mL
  Filled 2023-10-18 (×2): qty 60

## 2023-10-18 MED ORDER — FENTANYL CITRATE PF 50 MCG/ML IJ SOSY: 50.0000 ug | PREFILLED_SYRINGE | Freq: Once | INTRAMUSCULAR | Status: DC

## 2023-10-18 MED ORDER — FUROSEMIDE 10 MG/ML IJ SOLN: 40.0000 mg | INTRAMUSCULAR | Status: DC

## 2023-10-18 MED ORDER — POLYETHYLENE GLYCOL 3350 17 G PO PACK: 17.0000 g | PACK | Freq: Every day | ORAL | Status: DC

## 2023-10-18 NOTE — TOC Progression Note (Signed)
Transition of Care [facility]) - Progression Note    Patient Details  Name: Tami Cummings MRN: [id number] Date of Birth: February 22, 1946  [facility] CM/SW Contact  Chapman Fitch, RN Phone Number: 10/18/2023, 12:13 PM  Clinical Narrative:     Per Kenney Houseman at [facility] Patient do not do a bed hold, however if she wants to return she can pending bed availability   Expected Discharge Plan: Skilled Nursing Facility    Expected Discharge Plan and Services                                               Social Determinants of Health (SDOH) Interventions SDOH Screenings   Food Insecurity: No Food Insecurity (10/12/2023)  Housing: Low Risk  (10/12/2023)  Transportation Needs: No Transportation Needs (10/12/2023)  Utilities: Not At Risk (10/12/2023)  Alcohol Screen: Low Risk  (08/08/2022)  Depression (PHQ2-9): Low Risk  (05/07/2023)  Financial Resource Strain: Low Risk  (09/03/2023)   Received from [facility]  Physical Activity: Inactive (08/08/2022)  Social Connections: Unknown (08/08/2022)  Recent Concern: Social Connections - Socially Isolated (08/08/2022)  Stress: No Stress Concern Present (08/08/2022)  Tobacco Use: Medium Risk (10/12/2023)    Readmission Risk Interventions    10/17/2023   12:04 PM  Readmission Risk Prevention Plan  Transportation Screening Complete  Home Care Screening --  Medication Review (RN CM) Complete

## 2023-10-18 NOTE — Progress Notes (Addendum)
NAME:  Tami Cummings, MRN:  [id number], DOB:  09/24/1946, LOS: 7 ADMISSION DATE:  10/11/2023, CONSULTATION DATE: 10/16/2023 REFERRING MD: Dr. Fran Lowes, CHIEF COMPLAINT: Shortness of Breath    History of Present Illness:  Tami Cummings is a 77 y.o. female with medical history significant of DM2, dementia who presented from [facility] for concern for possible PNA.    Patient was admitted for dyspnea and hypoxia. The patient was recently hospitalized (08/31/23 - 09/10/23) for wound dehiscence after back surgery of T9-T12 fusion (done 07/30/23) following an injury that occurred in July of this year. After a washout she was discharged to a SNF with a PICC line for Vancomycin and Rocephin scheduled to d/c abx 10/11/23.   The patient has dementia as baseline and was given versed PRN d/t to agitation. A rapid response was called on the floor d/t suspected aspiration. The patient was saturating in the 60-70% with high flow [place] and was subsequently intubated.   Please see "Significant Hospital Events" section below for full detailed hospital course.  Pertinent  Medical History  COPD HTN  Type II Diabetes Mellitus  Arthritis   Significant Hospital Events: Including procedures, antibiotic start and stop dates in addition to other pertinent events   10/11/23: admitted to hospital service and treated for acute hypoxic respiratory failure 10/25: Agitated, trying to get OOB 10/26: Confused, agitated, trying to get OOB.  Required Haldol and Zyprexa. 10/27: Confused, refusing medications 10/28: excessive secretions in upper airway requiring suctioning 10/29: Transferred to the ICU due to increased work of breathing and O2 desaturation requiring mechanical ventilation and vasopressor support 10/30: Pt remains mechanically intubated vent settings improving: FiO2 45%/PEEP 10.  Requiring levophed gtt @5  mcg/min and vasopressin @0 .04 units/min.  Right-sided chest tube placed due to large pleural  effusion  10/31: Pt no longer requiring vasopressors and maintain map 65 or higher.  During WUA pts following commands.  Performed SBT, however became tachypneic with accessory muscle use and hypoxic placed back on full support.  Will diurese with 60 mg iv lasix x1 dose.  Will reattempt SBT following diuresis   Micro Data:  10/11/23: COVID-19 PCR>>negative 10/11/23: Blood cultures x2>> no growth 10/12/23: MRSA PCR >> negative 10/16/23: Blood cultures >>negative  10/16/23: Tracheal aspirate>>few wbc present/no organisms seen  10/16/23: Urine>>moderate leukocytes/wbc 21-50/few bacteria  10/17/23: MRSA PCR>>negative 10/17/23: RVP>>negative  10/17/23: Strep pneumo antigen>>negative  10/17/23: Legionella pneumo>> 10/17/23: Pleural fluid>>wbc present, predominantly mononuclear; no organisms seen   Antimicrobials:    Anti-infectives (From admission, onward)    Start     Dose/Rate Route Frequency Ordered Stop   10/17/23 1200  piperacillin-tazobactam (ZOSYN) IVPB 3.375 g        3.375 g 12.5 mL/hr over 240 Minutes Intravenous Every 8 hours 10/17/23 1053     10/17/23 1200  vancomycin (VANCOREADY) IVPB 1750 mg/350 mL        1,750 mg 175 mL/hr over 120 Minutes Intravenous  Once 10/17/23 1054 10/17/23 2342   10/16/23 1230  Ampicillin-Sulbactam (UNASYN) 3 g in sodium chloride 0.9 % 100 mL IVPB  Status:  Discontinued        3 g 200 mL/hr over 30 Minutes Intravenous Every 6 hours 10/16/23 1136 10/17/23 1111   10/11/23 1715  vancomycin (VANCOCIN) IVPB 1000 mg/200 mL premix  Status:  Discontinued        1,000 mg 200 mL/hr over 60 Minutes Intravenous  Once 10/11/23 1714 10/11/23 1729   10/11/23 1715  ceFEPIme (MAXIPIME) 2 g in sodium  chloride 0.9 % 100 mL IVPB  Status:  Discontinued        2 g 200 mL/hr over 30 Minutes Intravenous  Once 10/11/23 1714 10/11/23 1729   10/11/23 1715  azithromycin (ZITHROMAX) 500 mg in sodium chloride 0.9 % 250 mL IVPB  Status:  Discontinued        500 mg 250 mL/hr  over 60 Minutes Intravenous  Once 10/11/23 1714 10/11/23 1729      Objective   Blood pressure (!) 124/57, pulse (!) 57, temperature 97.9 F (36.6 C), temperature source Axillary, resp. rate 14, height 5\' 1"  (1.549 m), weight 78 kg, SpO2 92%.    Vent Mode: PSV FiO2 (%):  [30 %-40 %] 40 % Set Rate:  [12 bmp] 12 bmp Vt Set:  [400 mL] 400 mL PEEP:  [5 cmH20-8 cmH20] 5 cmH20 Pressure Support:  [8 cmH20] 8 cmH20 Plateau Pressure:  [19 cmH20-24 cmH20] 19 cmH20   Intake/Output Summary (Last 24 hours) at 10/18/2023 0902 Last data filed at 10/18/2023 1610 Gross per 24 hour  Intake 1643.48 ml  Output 1175 ml  Net 468.48 ml   Filed Weights   10/11/23 1144 10/18/23 0500  Weight: 78 kg 78 kg    Examination: General: Acute on chronically-ill appearing female, NAD mechanically intubated  HENT: Supple, no JVD  Lungs: Clear throughout, even, non labored, right-sided chest tube no crepitus around incision site/dressing dry/intact   Cardiovascular: NSR, s1s2, no m/r/g, 2+ radial/1+ distal pulses, trace generalized edema  Abdomen: +BS x4, obese, soft, non distended  Extremities: Normal bulk and tone, moves all extremities  Neuro:  Sedated, following commands, PERRL GU: Indwelling foley catheter draining yellow urine   Resolved Hospital Problem list     Assessment & Plan:   #Acute metabolic encephalopathy complicated by dementia  #Sedation needs in setting of mechanical ventilation CT Head 10/17/23: No acute intracranial process  - Correct metabolic derangements  - Maintain a RASS goal of 0 to -1 - PAD protocol to maintain RASS goal: Will discontinue propofol gtt; start precedex gtt/prn fentanyl  - Continue scheduled seroquel  - Will attempt to avoid benzo's if able   - Avoid sedating medications as able - Daily wake up assessment   #Undifferentiated Shock: hypovolemic in setting of aggressive diuresis vs septic  #Mildly elevated troponin likely secondary demand ischemia  #Acute on  chronic HFpEF #Moderate pulmonary hypertension #RLE DVT  PMHx: HTN, HLD, HFpEF Echocardiogram 10/17/23: LVEF 55-60%, grade I DD, RV systolic function and size normal.  Moderate tricuspid valve regurgitation Moderately elevated pulmonary artery systolic pressure, mild MR CTA Chest PE 10/16/23:  No pulmonary embolus identified. Satisfactory endotracheal and enteric tubes. Widespread Bilateral Pneumonia with extensive lower lobe consolidation right > left. Superimposed Large layering pleural effusions, favor transudate. Nonspecific Ascites in the upper abdomen. Diffuse idiopathic skeletal hyperostosis (DISH), widespread spinal ankylosis. Posterior spinal fusion hardware T9 through T12. Aortic Atherosclerosis (ICD10-I70.0). - Continuous telemetry monitoring - Prn levophed gtt to maintain map 65 or higher  - Trend lactic acid until normalized - HS Troponin until peaked 256.3  - Will diurese today 60 mg iv x1 dose  - Hold outpatient antihypertensives for now due to hypotension  - Heparin gtt restarted due to DVT: dosing per pharmacy    #Acute hypoxic hypercapnic respiratory failure  #AECOPD #Pneumonia  #Bilateral pleural effusions s/p right-sided chest tube placement  - Full vent support, implement lung protective strategies - Plateau pressures less than 30 cm H20 - Wean FiO2 & PEEP as tolerated  to maintain O2 sats 88 to 92% - Follow intermittent Chest X-ray & ABG as needed - SBT as respiratory status and mentation permits  - VAP bundle implemented  - Scheduled and nebulized steroids  - IV steroids: wean as tolerated  - Right-sided chest tube to -20 cm H2O wall suction for now - If respiratory status does not improve with diuresis pt may require left-sided thoracentesis   #Mild hypokalemia - Trend BMP  - Replace electrolytes as indicated ~ Pharmacy following for assistance with electrolyte replacement - Strict I's&O's   #Pneumonia  - Trend WBC and monitor fever curve  - Trend PCT  -  Follow cultures as outlined above  - Will broaden abx to zosyn pending culture results and sensitivities    #Diabetes Mellitus - CBG's q4h; Target range of 140 to 180 - SSI - Follow ICU hypo/hyperglycemia protocol   Best Practice (right click and "Reselect all SmartList Selections" daily)   Diet/type: Restart TF's if unable to extubate today  DVT prophylaxis: systemic heparin GI prophylaxis: PPI Lines: PICC line and still needed Foley:  Yes, and it is still needed Code Status:  full code Last date of multidisciplinary goals of care discussion [10/18/2023]  10/31: Pts son updated at bedside by ICU Intensivist regarding plan of care and pts condition  Labs   CBC: Recent Labs  Lab 10/14/23 0515 10/15/23 0457 10/16/23 1147 10/17/23 0255 10/17/23 1822 10/18/23 0501  WBC 5.2 6.2 7.5 9.0  --  4.6  HGB 10.2* 12.1 12.9 12.2  --  9.9*  HCT 34.1* 40.9 44.7 41.7 32.2* 33.3*  MCV 88.1 87.6 88.2 87.1  --  85.8  PLT 177 149* 152 141*  --  106*    Basic Metabolic Panel: Recent Labs  Lab 10/14/23 0515 10/15/23 0457 10/16/23 0426 10/16/23 1147 10/16/23 1939 10/17/23 0453 10/17/23 1207 10/18/23 0501  NA 143 145 143 145  --  143  --  141  K 2.9* 3.2* 3.2* 3.3* 3.8 3.7  --  2.8*  CL 95* 95* 90* 92*  --  95*  --  95*  CO2 40* 38* 40* 41*  --  35*  --  36*  GLUCOSE 120* 168* 136* 159*  --  157*  --  171*  BUN 12 12 15 18   --  31*  --  40*  CREATININE 0.33* 0.36* 0.56 0.60  --  0.79 0.77 0.71  CALCIUM 8.6* 8.8* 8.6* 8.9  --  8.4*  --  8.0*  MG 2.1 2.2  --   --  2.0 2.1  --  2.1  PHOS  --   --   --   --  5.1* 4.9*  --  3.9   GFR: Estimated Creatinine Clearance: 56.6 mL/min (by C-G formula based on SCr of 0.71 mg/dL). Recent Labs  Lab 10/11/23 1800 10/12/23 0649 10/15/23 0457 10/16/23 1147 10/16/23 1637 10/17/23 0255 10/18/23 0501  PROCALCITON <0.10  --   --  0.45  --  0.90 0.57  WBC  --    < > 6.2 7.5  --  9.0 4.6  LATICACIDVEN  --   --   --  2.8* 1.2  --   --    < > =  values in this interval not displayed.    Liver Function Tests: Recent Labs  Lab 10/16/23 1147 10/17/23 0453 10/17/23 1822 10/18/23 0501  AST 20  --   --   --   ALT 15  --   --   --  ALKPHOS 83  --   --   --   BILITOT 0.9  --   --   --   PROT 6.8  --   --   --   ALBUMIN 3.4* 2.7* 2.5* 2.4*   No results for input(s): "LIPASE", "AMYLASE" in the last 168 hours. No results for input(s): "AMMONIA" in the last 168 hours.  ABG    Component Value Date/Time   PHART 7.44 10/18/2023 0255   PCO2ART 63 (H) 10/18/2023 0255   PO2ART 105 10/18/2023 0255   HCO3 42.8 (H) 10/18/2023 0255   O2SAT 99 10/18/2023 0255     Coagulation Profile: Recent Labs  Lab 10/16/23 1939  INR 1.1    Cardiac Enzymes: No results for input(s): "CKTOTAL", "CKMB", "CKMBINDEX", "TROPONINI" in the last 168 hours.  HbA1C: Hemoglobin A1C  Date/Time Value Ref Range Status  07/13/2016 12:00 AM 7.9  Final   HB A1C (BAYER DCA - WAIVED)  Date/Time Value Ref Range Status  08/15/2022 04:25 PM 9.0 (H) 4.8 - 5.6 % Final    Comment:             Prediabetes: 5.7 - 6.4          Diabetes: >6.4          Glycemic control for adults with diabetes: <7.0   01/05/2021 11:12 AM 9.4 (H) <7.0 % Final    Comment:                                          Diabetic Adult            <7.0                                       Healthy Adult        4.3 - 5.7                                                           (DCCT/NGSP) American Diabetes Association's Summary of Glycemic Recommendations for Adults with Diabetes: Hemoglobin A1c <7.0%. More stringent glycemic goals (A1c <6.0%) may further reduce complications at the cost of increased risk of hypoglycemia.    Hgb A1c MFr Bld  Date/Time Value Ref Range Status  10/11/2023 11:49 AM 6.2 (H) 4.8 - 5.6 % Final    Comment:    (NOTE) Pre diabetes:          5.7%-6.4%  Diabetes:              >6.4%  Glycemic control for   <7.0% adults with diabetes   05/07/2023 01:50 PM  11.1 (H) 4.8 - 5.6 % Final    Comment:             Prediabetes: 5.7 - 6.4          Diabetes: >6.4          Glycemic control for adults with diabetes: <7.0     CBG: Recent Labs  Lab 10/17/23 1641 10/17/23 1952 10/17/23 2358 10/18/23 0324 10/18/23 0758  GLUCAP 119* 125* 138* 152* 183*    Review of Systems:  Unable to assess pt mechanically intubated   Past Medical History:  She,  has a past medical history of Arthritis, COPD (chronic obstructive pulmonary disease) (HCC), Diabetes mellitus without complication (HCC), Dyspnea, Hyperlipidemia, Hypertension, Neuromuscular disorder (HCC), Shoulder pain, right, and Wears dentures.   Surgical History:   Past Surgical History:  Procedure Laterality Date   BREAST BIOPSY Right 04/12/2016   stereo bx/clip- neg   CARPAL TUNNEL RELEASE  06/17/2000   CATARACT EXTRACTION W/PHACO Left 09/20/2015   Procedure: CATARACT EXTRACTION PHACO AND INTRAOCULAR LENS PLACEMENT (IOC);  Surgeon: Sherald Hess, MD;  Location: [facility];  Service: Ophthalmology;  Laterality: Left;  DIABETIC - insulin   CATARACT EXTRACTION W/PHACO Right 06/03/2018   Procedure: CATARACT EXTRACTION PHACO AND INTRAOCULAR LENS PLACEMENT (IOC) RIGHT DIABETES;  Surgeon: Nevada Crane, MD;  Location: [facility];  Service: Ophthalmology;  Laterality: Right;  Diabetes-insulin dependent   CESAREAN SECTION     x2   CHOLECYSTECTOMY     EYE SURGERY     TOTAL ABDOMINAL HYSTERECTOMY W/ BILATERAL SALPINGOOPHORECTOMY       Social History:   reports that she quit smoking about 10 years ago. Her smoking use included cigarettes. She started smoking about 30 years ago. She has a 20 pack-year smoking history. She has never used smokeless tobacco. She reports that she does not drink alcohol and does not use drugs.   Family History:  Her family history includes Cancer in her brother and sister; Heart disease in her father. There is no history of Breast cancer.    Allergies Allergies  Allergen Reactions   Empagliflozin    Actos [Pioglitazone] Other (See Comments)    CHF   Amlodipine Swelling    Leg swelling    Benazepril Other (See Comments)    angioedema   Hydrochlorothiazide Other (See Comments)    angioedema   Metformin And Related Diarrhea     Home Medications  Prior to Admission medications   Medication Sig Start Date End Date Taking? Authorizing Provider  acetaminophen (TYLENOL) 500 MG tablet Take 1,000 mg by mouth 3 (three) times daily.   Yes [provider]  albuterol (PROVENTIL) (2.5 MG/3ML) 0.083% nebulizer solution Take 2.5 mg by nebulization 4 (four) times daily.   Yes [provider]  amLODipine (NORVASC) 10 MG tablet Take 1 tablet (10 mg total) by mouth daily. 05/07/23  Yes Larae Grooms, NP  atenolol (TENORMIN) 100 MG tablet Take 1 tablet (100 mg total) by mouth daily. 05/07/23  Yes Larae Grooms, NP  atorvastatin (LIPITOR) 10 MG tablet Take 10 mg by mouth daily.   Yes [provider]  donepezil (ARICEPT) 5 MG tablet Take 5 mg by mouth at bedtime. 03/05/23 10/11/23 Yes [provider]  folic acid (FOLVITE) 400 MCG tablet Take 400 mcg by mouth daily.   Yes [provider]  gabapentin (NEURONTIN) 300 MG capsule Take 300 mg by mouth 3 (three) times daily.   Yes [provider]  insulin lispro (HUMALOG KWIKPEN) 100 UNIT/ML KwikPen Inject 0-6 Units into the skin 4 (four) times daily -  before meals and at bedtime.   Yes [provider]  ipratropium (ATROVENT) 0.02 % nebulizer solution Take 0.5 mg by nebulization 4 (four) times daily.   Yes [provider]  lidocaine 4 % Place 1 patch onto the skin daily.   Yes [provider]  methocarbamol (ROBAXIN) 750 MG tablet Take 750 mg by mouth 4 (four) times daily.   Yes [provider]  polyethylene glycol (MIRALAX / GLYCOLAX) 17 g packet Take 17 g by mouth 2 (two) times daily.   Yes [provider]  senna (SENOKOT) 8.6 MG TABS tablet Take 2 tablets by mouth 2 (two) times daily.   Yes [provider]  sertraline (ZOLOFT) 25 MG tablet Take 1 tablet (25 mg total) by mouth daily. You can increase to 50 mg in 2 weeks if you are still having mood symptoms. 06/29/23  Yes Mecum, Erin E, PA-C  tamsulosin (FLOMAX) 0.4 MG CAPS capsule Take 0.4 mg by mouth at bedtime.   Yes [provider]  BD PEN NEEDLE MICRO U/F 32G X 6 MM MISC USE 4 TO 5 TIMES DAILY WITH INSULIN USE 10/24/21   Laural Benes, Megan P, DO  Blood Glucose Monitoring Suppl (ONE TOUCH ULTRA 2) w/Device KIT 1 each by Does not apply route 2 (two) times daily. 05/15/23   Larae Grooms, NP  Blood Glucose Monitoring Suppl (ONETOUCH VERIO) w/Device KIT Use to check blood sugar 2-3 times daily and document, bring results to provider visits. 06/12/22   Larae Grooms, NP  Leda Min 5-2.5-18.5 LF-MCG/0.5 injection  05/13/23   [provider]  glucose blood (ONETOUCH ULTRA) test strip 1 each by Other route 2 (two) times daily. Use as instructed 05/15/23   Larae Grooms, NP  glucose blood test strip Use to check blood sugar 2-3 times daily and document, bring results to provider visits. 05/23/22   Larae Grooms, NP  hydrALAZINE (APRESOLINE) 10 MG tablet Take 1 tablet (10 mg total) by mouth 2 (two) times daily. Patient not taking: Reported on 10/11/2023 05/07/23   Larae Grooms, NP  insulin degludec (TRESIBA FLEXTOUCH) 100 UNIT/ML FlexTouch Pen INJECT 40 UNITS SUBCUTANEOUSLY ONCE DAILY Patient not taking: Reported on 10/11/2023 05/02/23   Larae Grooms, NP  Lancets [facility] ULTRASOFT) lancets Use to check blood sugar 2-3 times daily and document, bring results to provider visits. 06/12/22   Larae Grooms, NP  Lancets [facility] ULTRASOFT) lancets 1 each by Other route 2 (two) times daily. Use as instructed 05/15/23   Larae Grooms, NP  lovastatin (MEVACOR) 40 MG tablet Take 1 tablet (40 mg total) by  mouth daily with breakfast. Patient not taking: Reported on 10/11/2023 05/07/23   Larae Grooms, NP  OneTouch Delica Lancets 33G MISC 1 applicator by Does not apply route in the morning and at bedtime. 05/23/22   Larae Grooms, NP  Semaglutide,0.25 or 0.5MG /DOS, (OZEMPIC, 0.25 OR 0.5 MG/DOSE,) 2 MG/3ML SOPN Inject 0.25 mg into the skin once a week. Patient not taking: Reported on 10/11/2023 05/28/23   Larae Grooms, NP  spironolactone (ALDACTONE) 25 MG tablet Take 1 tablet (25 mg total) by mouth daily. 05/07/23   Larae Grooms, NP  VITAMIN D, CHOLECALCIFEROL, PO Take 5,000 Units by mouth daily. Patient not taking: Reported on 10/11/2023    [provider]     Critical care time: 40 minutes     Zada Girt, AGNP  Pulmonary/Critical Care Pager [phone] (please enter 7 digits) PCCM Consult Pager [phone] (please enter 7 digits)

## 2023-10-18 NOTE — Consult Note (Signed)
PHARMACY - ANTICOAGULATION CONSULT NOTE  Pharmacy Consult for heparin infusion Indication: DVT  Allergies  Allergen Reactions   Empagliflozin    Actos [Pioglitazone] Other (See Comments)    CHF   Amlodipine Swelling    Leg swelling    Benazepril Other (See Comments)    angioedema   Hydrochlorothiazide Other (See Comments)    angioedema   Metformin And Related Diarrhea    Patient Measurements: Height: 5\' 1"  (154.9 cm) Weight: 78 kg (171 lb 15.3 oz) IBW/kg (Calculated) : 47.8 Heparin Dosing Weight: 65.2 kg  Vital Signs: Temp: 98 F (36.7 C) (10/31 0400) Temp Source: Axillary (10/31 0400) BP: 124/57 (10/31 0400) Pulse Rate: 57 (10/31 0400)  Labs: Recent Labs    10/16/23 1147 10/16/23 1637 10/16/23 1939 10/17/23 0255 10/17/23 0453 10/17/23 1207 10/17/23 1502 10/17/23 1710 10/17/23 1822 10/18/23 0501  HGB 12.9  --   --  12.2  --   --   --   --   --  9.9*  HCT 44.7  --   --  41.7  --   --   --   --  32.2* 33.3*  PLT 152  --   --  141*  --   --   --   --   --  106*  APTT  --   --  27  --   --   --   --   --   --   --   LABPROT  --   --  14.1  --   --   --   --   --   --   --   INR  --   --  1.1  --   --   --   --   --   --   --   HEPARINUNFRC  --   --   --  0.51  --  0.61  --   --   --  >1.10*  CREATININE 0.60  --   --   --  0.79 0.77  --   --   --   --   TROPONINIHS 46*   < >  --   --   --  50* 60* 60*  --   --    < > = values in this interval not displayed.    Estimated Creatinine Clearance: 56.6 mL/min (by C-G formula based on SCr of 0.77 mg/dL).   Medical History: Past Medical History:  Diagnosis Date   Arthritis    knee   COPD (chronic obstructive pulmonary disease) (HCC)    Diabetes mellitus without complication (HCC)    type 2   Dyspnea    Hyperlipidemia    Hypertension    controlled   Neuromuscular disorder (HCC)    numbness in hands and feet   Shoulder pain, right    Wears dentures    upper and lower    Medications:  Patient was on  enoxaparin 40 mg SubQ q24h, last dose 10/28 @ 2128  Assessment: 77 yo female presented to [facility] due to acute respiratory failure. PMG includes T2DM, HFpEF, and dementia.  Pharmacy consulted to start heparin infusion for possible PE.   Goal of Therapy:  Heparin level 0.3-0.7 units/ml Monitor platelets by anticoagulation protocol: Yes   10/30 0255 HL 0.51, therapeutic x 1 10/31 0501 HL >1.1, supratherapeutic  Plan: Insurance claims handler.  Hold infusion for 1 hour. Restart heparin infusion at 850 units/hr Check HL in 8 hours after restart  CBC daily while on heparin  Otelia Sergeant, PharmD, [facility] 10/18/2023 6:08 AM

## 2023-10-18 NOTE — Progress Notes (Addendum)
1047 Report received.  Patient transitioned from Propofol to Precedex for 2nd SBT. Patient agitated when Propofol stopped. Precedex quickly titrated to 1.2 mcg/kg/hr Family present to soothe patient. Afternoon SBT failed as well because patients' tidal volumes were low -approximately 200s. Placed back on respiratory rate of 12. 1800 QTc was .54.

## 2023-10-18 NOTE — Progress Notes (Signed)
Nutrition Follow-up  DOCUMENTATION CODES:   Obesity unspecified  INTERVENTION:   Vital 1.2@30ml /hr continuous + ProSource TF 20- Give 60ml BID via tube  Free water flushes q4 hours to maintain tube patency   Propofol: 18.72 ml/hr- provides 494kcal/day   Regimen provides 1518kcal/day, 94g/day protein and 1126ml/day of free water.   Liquid MVI daily via tube   Daily weights   NUTRITION DIAGNOSIS:   Inadequate oral intake related to inability to eat as evidenced by NPO status.  GOAL:   Provide needs based on ASPEN/SCCM guidelines -met   MONITOR:   Vent status, Labs, Weight trends, TF tolerance, I & O's, Skin  ASSESSMENT:   77 y/o female with h/o dementia, diabetes, COPD, CKD III, HTN, HTN and recent T9-T-12 fusion complicated by wound dehiscence and infection s/p revision in September who is admitted with PNA, shock, and bilateral pleural effusions s/p R chest tube placement 10/30.  Pt remains sedated and ventilated. OGT in place. Pt under going SBTs. Tube feeds on hold for possible extubation. Will plan to resume tube feeds if pt does not extubate. Pt is being diuresed. Right chest tube in place. Pt may require left thoracentesis. No BM since admission; pt is receiving bowel regimen. Per chart, pt appears to be at her UBW if her bed weights correct.  Medications reviewed and include: colace, solu-cortef, insulin, protonix, miralax, KCl, heparin, zosyn, propofol   Labs reviewed: K 2.8(L), BUN 40(H), P 3.9 wnl, Mg 2.1 wnl  Hgb 9.9(L), Hct 33.3(L), MCH 25.5(L), MCHC 29.7(L) Cbgs- 183, 152 x 24 hrs  AIC 6.2(H)- 10/24  Patient is currently intubated on ventilator support MV: 5.4 L/min Temp (24hrs), Avg:98.2 F (36.8 C), Min:97.9 F (36.6 C), Max:98.5 F (36.9 C)  Propofol: 18.72 ml/hr- provides 494kcal/day   MAP- >34mmHg   UOP-   Drains-   Diet Order:   Diet Order             Diet NPO time specified  Diet effective now                   EDUCATION NEEDS:   Not appropriate for education at this time  Skin:  Skin Assessment: Reviewed RN Assessment  Last BM:  10/14/23  Height:   Ht Readings from Last 1 Encounters:  10/16/23 5\' 1"  (1.549 m)    Weight:   Wt Readings from Last 1 Encounters:  10/18/23 78 kg    Ideal Body Weight:  47.7 kg  BMI:  Body mass index is 32.49 kg/m.  Estimated Nutritional Needs:   Kcal:  1282kcal/day  Protein:  1.3-1.5L/day  Fluid:  1-1.2 L  Betsey Holiday MS, RD, LDN Please refer to [facility] for RD and/or RD on-call/weekend/after hours pager

## 2023-10-18 NOTE — Consult Note (Signed)
PHARMACY - ANTICOAGULATION CONSULT NOTE  Pharmacy Consult for heparin infusion Indication: DVT  Allergies  Allergen Reactions   Empagliflozin    Actos [Pioglitazone] Other (See Comments)    CHF   Amlodipine Swelling    Leg swelling    Benazepril Other (See Comments)    angioedema   Hydrochlorothiazide Other (See Comments)    angioedema   Metformin And Related Diarrhea    Patient Measurements: Height: 5\' 1"  (154.9 cm) Weight: 78 kg (171 lb 15.3 oz) IBW/kg (Calculated) : 47.8 Heparin Dosing Weight: 65.2 kg  Vital Signs: Temp: 98 F (36.7 C) (10/31 1200) Temp Source: Axillary (10/31 0800) BP: 170/67 (10/31 1416) Pulse Rate: 71 (10/31 1416)  Labs: Recent Labs    10/16/23 1147 10/16/23 1637 10/16/23 1939 10/17/23 0255 10/17/23 0453 10/17/23 1207 10/17/23 1502 10/17/23 1710 10/17/23 1822 10/18/23 0501  HGB 12.9  --   --  12.2  --   --   --   --   --  9.9*  HCT 44.7  --   --  41.7  --   --   --   --  32.2* 33.3*  PLT 152  --   --  141*  --   --   --   --   --  106*  APTT  --   --  27  --   --   --   --   --   --   --   LABPROT  --   --  14.1  --   --   --   --   --   --   --   INR  --   --  1.1  --   --   --   --   --   --   --   HEPARINUNFRC  --   --   --  0.51  --  0.61  --   --   --  >1.10*  CREATININE 0.60  --   --   --  0.79 0.77  --   --   --  0.71  TROPONINIHS 46*   < >  --   --   --  50* 60* 60*  --   --    < > = values in this interval not displayed.    Estimated Creatinine Clearance: 56.6 mL/min (by C-G formula based on SCr of 0.71 mg/dL).   Medical History: Past Medical History:  Diagnosis Date   Arthritis    knee   COPD (chronic obstructive pulmonary disease) (HCC)    Diabetes mellitus without complication (HCC)    type 2   Dyspnea    Hyperlipidemia    Hypertension    controlled   Neuromuscular disorder (HCC)    numbness in hands and feet   Shoulder pain, right    Wears dentures    upper and lower    Medications:  Patient was on  enoxaparin 40 mg SubQ q24h, last dose 10/28 @ 2128  Assessment: 77 yo female presented to [facility] due to acute respiratory failure. PMG includes T2DM, HFpEF, and dementia.  Pharmacy consulted to start heparin infusion for possible PE.   Goal of Therapy:  Heparin level 0.3-0.7 units/ml Monitor platelets by anticoagulation protocol: Yes   Plan: heparin level subtherapeutic following recent rate decrease (based on rate history and levels I suspect that the previous supratherapeutic value was a possible error) ---increase heparin infusion rate to 1050 units/hr  ---Check heparin level in  8 hours after rate change ---CBC daily while on heparin  Burnis Medin, PharmD, BCPS 10/18/2023 3:52 PM

## 2023-10-18 NOTE — Progress Notes (Signed)
Daily Progress Note   Patient Name: Tami Cummings       Date: 10/18/2023 DOB: 1946-07-28  Age: 77 y.o. MRN#: [id number] Attending Physician: Raechel Chute, MD Primary Care Physician: Larae Grooms, NP Admit Date: 10/11/2023  Reason for Consultation/Follow-up: Establishing goals of care  Subjective: Notes and labs reviewed.  In to see patient.  Patient's children are at bedside. She is lying bed, ventilator support remains in place.  Family is holding her hands to help keep her from removing breathing tube. Family advises that she becomes agitated with wake up assessments. Nursing increasing Precedex.  Family advises that they have received updates and are hopeful to continue working towards extubation.     Length of Stay: 7  Current Medications: Scheduled Meds:   budesonide (PULMICORT) nebulizer solution  0.5 mg Nebulization BID   Chlorhexidine Gluconate Cloth  6 each Topical Daily   docusate  100 mg Per Tube BID   feeding supplement (PROSource TF20)  60 mL Per Tube QID   feeding supplement (VITAL 1.5 CAL)  1,000 mL Per Tube Q24H   fentaNYL (SUBLIMAZE) injection  50 mcg Intravenous Once   free water  160 mL Per Tube Q6H   hydrocortisone sod succinate (SOLU-CORTEF) inj  50 mg Intravenous Q6H   insulin aspart  0-15 Units Subcutaneous Q4H   ipratropium-albuterol  3 mL Nebulization Q6H   mouth rinse  15 mL Mouth Rinse Q2H   pantoprazole (PROTONIX) IV  40 mg Intravenous QHS   polyethylene glycol  17 g Per Tube Daily   QUEtiapine  100 mg Per Tube QHS   sodium chloride flush  10-40 mL Intracatheter Q12H    Continuous Infusions:  dexmedetomidine (PRECEDEX) IV infusion 0.4 mcg/kg/hr (10/18/23 1112)   heparin 850 Units/hr (10/18/23 1000)   norepinephrine (LEVOPHED) Adult  infusion Stopped (10/17/23 2126)   piperacillin-tazobactam (ZOSYN)  IV 12.5 mL/hr at 10/18/23 1000   propofol (DIPRIVAN) infusion 40 mcg/kg/min (10/18/23 1016)    PRN Meds: atropine, fentaNYL (SUBLIMAZE) injection, ondansetron (ZOFRAN) IV, ondansetron, mouth rinse, sodium chloride flush  Physical Exam Constitutional:      Comments: Eyes open intermittently.  Patient with increasing sedation in place on ventilator.             Vital Signs: BP (!) 154/63   Pulse 75   Temp 98  F (36.7 C)   Resp 17   Ht 5\' 1"  (1.549 m)   Wt 78 kg   SpO2 98%   BMI 32.49 kg/m  SpO2: SpO2: 98 % O2 Device: O2 Device: Ventilator O2 Flow Rate: O2 Flow Rate (L/min): 50 L/min  Intake/output summary:  Intake/Output Summary (Last 24 hours) at 10/18/2023 1235 Last data filed at 10/18/2023 1016 Gross per 24 hour  Intake 1438.43 ml  Output 1300 ml  Net 138.43 ml   LBM: Last BM Date : 10/14/23 Baseline Weight: Weight: 78 kg Most recent weight: Weight: 78 kg      Patient Active Problem List   Diagnosis Date Noted   Acute on chronic respiratory failure with hypoxia and hypercapnia (HCC) 10/17/2023   HAP (hospital-acquired pneumonia) 10/17/2023   Acute deep vein thrombosis (DVT) of calf muscle vein (HCC) 10/17/2023   Hypoxia 10/11/2023   Open T11 vertebral fracture (HCC) 07/23/2023   Acute midline thoracic back pain 07/20/2023   Adjustment disorder with anxious mood 06/29/2023   Mild dementia without behavioral disturbance, psychotic disturbance, mood disturbance, or anxiety (HCC) 01/23/2023   Osteopenia 01/01/2021   Vitamin D deficiency 10/18/2020   Vitamin B12 deficiency 10/18/2020   CKD (chronic kidney disease) stage 3, GFR 30-59 ml/min (HCC) 10/08/2020   Senile purpura (HCC) 04/10/2017   Morbid obesity (HCC) 04/10/2017   Advance care planning 04/10/2017   Noncompliance 10/30/2016   DM type 2 with diabetic peripheral neuropathy (HCC)    Hyperlipidemia associated with type 2 diabetes  mellitus (HCC)    Hypertension associated with diabetes [facility])     Palliative Care Assessment & Plan    Recommendations/Plan: Family wants time for outcomes, and is hopeful patient will tolerate extubation.  PMT will shadow for needs over the weekend.  Code Status:    Code Status Orders  (From admission, onward)           Start     Ordered   10/11/23 1810  Full code  Continuous       Question:  By:  Answer:  Default: patient does not have capacity for decision making, no surrogate or prior directive available   10/11/23 1809           Code Status History     This patient has a current code status but no historical code status.        Thank you for allowing the Palliative Medicine Team to assist in the care of this patient.    Morton Stall, NP  Please contact Palliative Medicine Team phone at [phone] for questions and concerns.

## 2023-10-19 ENCOUNTER — Inpatient Hospital Stay: Payer: 59

## 2023-10-19 DIAGNOSIS — I82469 Acute embolism and thrombosis of unspecified calf muscular vein: Secondary | ICD-10-CM | POA: Diagnosis not present

## 2023-10-19 DIAGNOSIS — I5033 Acute on chronic diastolic (congestive) heart failure: Secondary | ICD-10-CM | POA: Diagnosis not present

## 2023-10-19 DIAGNOSIS — J9601 Acute respiratory failure with hypoxia: Secondary | ICD-10-CM | POA: Diagnosis not present

## 2023-10-19 DIAGNOSIS — J9621 Acute and chronic respiratory failure with hypoxia: Secondary | ICD-10-CM | POA: Diagnosis not present

## 2023-10-19 LAB — CBC WITH DIFFERENTIAL/PLATELET
Abs Immature Granulocytes: 0.05 10*3/uL (ref 0.00–0.07)
Basophils Absolute: 0 10*3/uL (ref 0.0–0.1)
Basophils Relative: 0 %
Eosinophils Absolute: 0 10*3/uL (ref 0.0–0.5)
Eosinophils Relative: 0 %
HCT: 37 % (ref 36.0–46.0)
Hemoglobin: 11.4 g/dL — ABNORMAL LOW (ref 12.0–15.0)
Immature Granulocytes: 1 %
Lymphocytes Relative: 3 %
Lymphs Abs: 0.2 10*3/uL — ABNORMAL LOW (ref 0.7–4.0)
MCH: 25.7 pg — ABNORMAL LOW (ref 26.0–34.0)
MCHC: 30.8 g/dL (ref 30.0–36.0)
MCV: 83.3 fL (ref 80.0–100.0)
Monocytes Absolute: 0.5 10*3/uL (ref 0.1–1.0)
Monocytes Relative: 7 %
Neutro Abs: 5.8 10*3/uL (ref 1.7–7.7)
Neutrophils Relative %: 89 %
Platelets: 70 10*3/uL — ABNORMAL LOW (ref 150–400)
RBC: 4.44 MIL/uL (ref 3.87–5.11)
RDW: 14.4 % (ref 11.5–15.5)
WBC: 6.5 10*3/uL (ref 4.0–10.5)
nRBC: 0 % (ref 0.0–0.2)

## 2023-10-19 LAB — PROTIME-INR
INR: 1.3 — ABNORMAL HIGH (ref 0.8–1.2)
Prothrombin Time: 16.4 s — ABNORMAL HIGH (ref 11.4–15.2)

## 2023-10-19 LAB — RENAL FUNCTION PANEL
Albumin: 2.7 g/dL — ABNORMAL LOW (ref 3.5–5.0)
Anion gap: 10 (ref 5–15)
BUN: 24 mg/dL — ABNORMAL HIGH (ref 8–23)
CO2: 38 mmol/L — ABNORMAL HIGH (ref 22–32)
Calcium: 8.3 mg/dL — ABNORMAL LOW (ref 8.9–10.3)
Chloride: 97 mmol/L — ABNORMAL LOW (ref 98–111)
Creatinine, Ser: 0.45 mg/dL (ref 0.44–1.00)
GFR, Estimated: >60 mL/min (ref >60)
Glucose, Bld: 206 mg/dL — ABNORMAL HIGH (ref 70–99)
Phosphorus: 1.6 mg/dL — ABNORMAL LOW (ref 2.5–4.6)
Potassium: 3.5 mmol/L (ref 3.5–5.1)
Sodium: 145 mmol/L (ref 135–145)

## 2023-10-19 LAB — CULTURE, RESPIRATORY W GRAM STAIN: Culture: NORMAL

## 2023-10-19 LAB — CBC
HCT: 39.1 % (ref 36.0–46.0)
Hemoglobin: 11.6 g/dL — ABNORMAL LOW (ref 12.0–15.0)
MCH: 25.4 pg — ABNORMAL LOW (ref 26.0–34.0)
MCHC: 29.7 g/dL — ABNORMAL LOW (ref 30.0–36.0)
MCV: 85.6 fL (ref 80.0–100.0)
Platelets: 73 10*3/uL — ABNORMAL LOW (ref 150–400)
RBC: 4.57 MIL/uL (ref 3.87–5.11)
RDW: 14.5 % (ref 11.5–15.5)
WBC: 5.3 10*3/uL (ref 4.0–10.5)
nRBC: 0 % (ref 0.0–0.2)

## 2023-10-19 LAB — HEPARIN LEVEL (UNFRACTIONATED)
Heparin Unfractionated: 0.3 [IU]/mL (ref 0.30–0.70)
Heparin Unfractionated: <0.1 [IU]/mL — ABNORMAL LOW (ref 0.30–0.70)

## 2023-10-19 LAB — APTT
aPTT: 38 s — ABNORMAL HIGH (ref 24–36)
aPTT: 39 s — ABNORMAL HIGH (ref 24–36)
aPTT: 48 s — ABNORMAL HIGH (ref 24–36)

## 2023-10-19 LAB — POTASSIUM: Potassium: 2.9 mmol/L — ABNORMAL LOW (ref 3.5–5.1)

## 2023-10-19 LAB — GLUCOSE, CAPILLARY
Glucose-Capillary: 100 mg/dL — ABNORMAL HIGH (ref 70–99)
Glucose-Capillary: 204 mg/dL — ABNORMAL HIGH (ref 70–99)
Glucose-Capillary: 242 mg/dL — ABNORMAL HIGH (ref 70–99)
Glucose-Capillary: 269 mg/dL — ABNORMAL HIGH (ref 70–99)
Glucose-Capillary: 328 mg/dL — ABNORMAL HIGH (ref 70–99)
Glucose-Capillary: 88 mg/dL (ref 70–99)

## 2023-10-19 LAB — MAGNESIUM
Magnesium: 1.9 mg/dL (ref 1.7–2.4)
Magnesium: 1.7 mg/dL (ref 1.7–2.4)

## 2023-10-19 LAB — PHOSPHORUS: Phosphorus: 3 mg/dL (ref 2.5–4.6)

## 2023-10-19 LAB — FIBRINOGEN: Fibrinogen: 411 mg/dL (ref 210–475)

## 2023-10-19 LAB — TECHNOLOGIST SMEAR REVIEW: Plt Morphology: DECREASED

## 2023-10-19 LAB — D-DIMER, QUANTITATIVE: D-Dimer, Quant: 1.04 ug{FEU}/mL — ABNORMAL HIGH (ref 0.00–0.50)

## 2023-10-19 MED ORDER — MORPHINE SULFATE (PF) 2 MG/ML IV SOLN
1.0000 mg | Freq: Once | INTRAVENOUS | Status: AC
  Administered 2023-10-19: 1 mg via INTRAVENOUS
  Filled 2023-10-19: qty 1

## 2023-10-19 MED ORDER — HYDROCORTISONE SOD SUC (PF) 100 MG IJ SOLR
50.0000 mg | Freq: Two times a day (BID) | INTRAMUSCULAR | Status: DC
  Administered 2023-10-19 – 2023-10-20 (×2): 50 mg via INTRAVENOUS
  Filled 2023-10-19 (×2): qty 2

## 2023-10-19 MED ORDER — FUROSEMIDE 10 MG/ML IJ SOLN
20.0000 mg | Freq: Once | INTRAMUSCULAR | Status: AC
  Administered 2023-10-19: 20 mg via INTRAVENOUS
  Filled 2023-10-19: qty 2

## 2023-10-19 MED ORDER — SODIUM CHLORIDE 0.9 % IV SOLN
2.0000 g | INTRAVENOUS | Status: DC
  Administered 2023-10-19 – 2023-10-22 (×4): 2 g via INTRAVENOUS
  Filled 2023-10-19 (×5): qty 20

## 2023-10-19 MED ORDER — MORPHINE SULFATE (PF) 2 MG/ML IV SOLN
1.0000 mg | INTRAVENOUS | Status: DC | PRN
  Administered 2023-10-19: 1 mg via INTRAVENOUS
  Administered 2023-10-19 – 2023-10-21 (×3): 2 mg via INTRAVENOUS
  Filled 2023-10-19 (×4): qty 1

## 2023-10-19 MED ORDER — SODIUM CHLORIDE 0.9% FLUSH: 3.0000 mL | INTRAVENOUS | Status: DC | PRN

## 2023-10-19 MED ORDER — SODIUM CHLORIDE 0.9 % IV SOLN
250.0000 mL | INTRAVENOUS | Status: AC | PRN
Start: 2023-10-19 — End: 2023-10-20

## 2023-10-19 MED ORDER — FUROSEMIDE 10 MG/ML IJ SOLN
60.0000 mg | Freq: Once | INTRAMUSCULAR | Status: AC
  Administered 2023-10-19: 60 mg via INTRAVENOUS
  Filled 2023-10-19: qty 6

## 2023-10-19 MED ORDER — SODIUM CHLORIDE 0.9% FLUSH
3.0000 mL | Freq: Two times a day (BID) | INTRAVENOUS | Status: DC
  Administered 2023-10-19 – 2023-10-21 (×6): 3 mL via INTRAVENOUS

## 2023-10-19 MED ORDER — LIDOCAINE 5 % EX PTCH
1.0000 | MEDICATED_PATCH | CUTANEOUS | Status: DC
Start: 2023-10-19 — End: 2023-10-26
  Administered 2023-10-19 – 2023-10-25 (×7): 1 via TRANSDERMAL
  Filled 2023-10-19 (×8): qty 1

## 2023-10-19 MED ORDER — POTASSIUM PHOSPHATES 15 MMOLE/5ML IV SOLN
45.0000 mmol | Freq: Once | INTRAVENOUS | Status: AC
  Administered 2023-10-19: 45 mmol via INTRAVENOUS
  Filled 2023-10-19: qty 15

## 2023-10-19 MED ORDER — MAGNESIUM SULFATE 2 GM/50ML IV SOLN
2.0000 g | Freq: Once | INTRAVENOUS | Status: AC
  Administered 2023-10-19: 2 g via INTRAVENOUS
  Filled 2023-10-19: qty 50

## 2023-10-19 MED ORDER — POTASSIUM CHLORIDE 10 MEQ/50ML IV SOLN
10.0000 meq | INTRAVENOUS | Status: AC
  Administered 2023-10-19 – 2023-10-20 (×5): 10 meq via INTRAVENOUS
  Filled 2023-10-19 (×5): qty 50

## 2023-10-19 MED ORDER — ARGATROBAN 50 MG/50ML IV SOLN
1.0100 ug/kg/min | INTRAVENOUS | Status: DC
  Administered 2023-10-19: 0.5 ug/kg/min via INTRAVENOUS
  Administered 2023-10-19: 0.65 ug/kg/min via INTRAVENOUS
  Administered 2023-10-20 – 2023-10-22 (×5): 1.01 ug/kg/min via INTRAVENOUS
  Filled 2023-10-19 (×7): qty 50

## 2023-10-19 NOTE — IPAL (Signed)
  Interdisciplinary Goals of Care Family Meeting   Date carried out: 10/19/2023  Location of the meeting: Bedside  Member's involved: Physician and Family Member or next of kin  Durable Power of Attorney or acting medical decision maker: [name] and [name]  Discussion: We discussed goals of care for [name] Explained that with less sedation she is awake and following commands. She has not tolerated an SBT this AM. Son and Daughter report that they would not her to remain intubated, and would like to proceed with a one way extubation. They had asked [place] to discontinue all sedation this morning and after conferring with their mother, they want to respect her wishes of not wanting a breathing tube in place. Should she be hypoxic and very short of breath on extubation, or exhibit signs of suffering, we will proceed with comfort measures only.  Code status:   Code Status: Full Code   Disposition: DNR/DNI, one way extubation, consider comfort measures if deteriorates  Time spent for the meeting: 10 minutes    Tami Chute, MD  10/19/2023, 12:44 PM

## 2023-10-19 NOTE — Inpatient Diabetes Management (Signed)
Inpatient Diabetes Program Recommendations  AACE/ADA: New Consensus Statement on Inpatient Glycemic Control (2015)  Target Ranges:  Prepandial:   less than 140 mg/dL      Peak postprandial:   less than 180 mg/dL (1-2 hours)      Critically ill patients:  140 - 180 mg/dL   Lab Results  Component Value Date   GLUCAP 242 (H) 10/19/2023   HGBA1C 6.2 (H) 10/11/2023    Review of Glycemic Control  Latest Reference Range & Units 10/18/23 20:03 10/18/23 23:03 10/19/23 04:26 10/19/23 08:28  Glucose-Capillary 70 - 99 mg/dL 295 (H) 621 (H) 308 (H) 242 (H)  (H): Data is abnormally high Diabetes history: Type 2 DM Outpatient Diabetes medications: Ozempic 0.25 mg qwk (NT), Tresiba 40 units every day (NT), Humalog 0-6 units QID Current orders for Inpatient glycemic control: Novolog 0-20 units Q4H Solucortef 50 mg Q6H  Inpatient Diabetes Program Recommendations:    Consider adding Novolog 3 units Q4H for tube feed coverage (to be stopped or held in the event tube feeds stopped).  Thanks, Lujean Rave, MSN, RNC-OB Diabetes Coordinator [phone] (8a-5p)

## 2023-10-19 NOTE — Progress Notes (Signed)
Family requested to speak with Dr. Aundria Rud and also requested for precidex to be turned off.  Dr. Aundria Rud updated and met with the family, family requesting one-way extubation at this time.

## 2023-10-19 NOTE — Progress Notes (Signed)
NAME:  Tami Cummings, MRN:  [id number], DOB:  [birth year], LOS: 8 ADMISSION DATE:  10/11/2023, CONSULTATION DATE: 10/16/2023 REFERRING MD: Dr. Fran Lowes, CHIEF COMPLAINT: Shortness of Breath    History of Present Illness:  Tami Cummings is a 77 y.o. female with medical history significant of DM2, dementia who presented from [facility] for concern for possible PNA.    Patient was admitted for dyspnea and hypoxia. The patient was recently hospitalized (08/31/23 - 09/10/23) for wound dehiscence after back surgery of T9-T12 fusion (done 07/30/23) following an injury that occurred in July of this year. After a washout she was discharged to a SNF with a PICC line for Vancomycin and Rocephin scheduled to d/c abx 10/11/23.   The patient has dementia as baseline and was given versed PRN d/t to agitation. A rapid response was called on the floor d/t suspected aspiration. The patient was saturating in the 60-70% with high flow [place] and was subsequently intubated.   Please see "Significant Hospital Events" section below for full detailed hospital course.  Pertinent  Medical History  COPD HTN  Type II Diabetes Mellitus  Arthritis   Significant Hospital Events: Including procedures, antibiotic start and stop dates in addition to other pertinent events   10/11/23: admitted to hospital service and treated for acute hypoxic respiratory failure 10/25: Agitated, trying to get OOB 10/26: Confused, agitated, trying to get OOB.  Required Haldol and Zyprexa. 10/27: Confused, refusing medications 10/28: excessive secretions in upper airway requiring suctioning 10/29: Transferred to the ICU due to increased work of breathing and O2 desaturation requiring mechanical ventilation and vasopressor support 10/30: Pt remains mechanically intubated vent settings improving: FiO2 45%/PEEP 10.  Requiring levophed gtt @5  mcg/min and vasopressin @0 .04 units/min.  Right-sided chest tube placed due to large pleural  effusion  10/31: Pt no longer requiring vasopressors and maintain map 65 or higher.  During WUA pts following commands.  Performed SBT, however became tachypneic with accessory muscle use and hypoxic placed back on full support.  Will diurese with 60 mg iv lasix x1 dose.  Will reattempt SBT following diuresis  11/1: On minimal vent support, Diurese again with 60 mg IV Lasix x1 dose then perform SBT as tolerated.  With worsening thrombocytopenia, start Argatroban while ruling out HIT and DIC.  In the afternoon family requests 1 way extubation with transition to Comfort Measures should she not tolerate extubation  Micro Data:  10/11/23: COVID-19 PCR>>negative 10/11/23: Blood cultures x2>> no growth 10/12/23: MRSA PCR >> negative 10/16/23: Blood cultures >>negative  10/16/23: Tracheal aspirate>>few wbc present/no organisms seen  10/16/23: Urine>>moderate leukocytes/wbc 21-50/few bacteria  10/17/23: MRSA PCR>>negative 10/17/23: RVP>>negative  10/17/23: Strep pneumo antigen>>negative  10/17/23: Legionella pneumo>> 10/17/23: Pleural fluid>>wbc present, predominantly mononuclear; no organisms seen   Antimicrobials:    Anti-infectives (From admission, onward)    Start     Dose/Rate Route Frequency Ordered Stop   10/17/23 1200  piperacillin-tazobactam (ZOSYN) IVPB 3.375 g        3.375 g 12.5 mL/hr over 240 Minutes Intravenous Every 8 hours 10/17/23 1053     10/17/23 1200  vancomycin (VANCOREADY) IVPB 1750 mg/350 mL        1,750 mg 175 mL/hr over 120 Minutes Intravenous  Once 10/17/23 1054 10/17/23 2342   10/16/23 1230  Ampicillin-Sulbactam (UNASYN) 3 g in sodium chloride 0.9 % 100 mL IVPB  Status:  Discontinued        3 g 200 mL/hr over 30 Minutes Intravenous Every 6 hours 10/16/23 1136  10/17/23 1111   10/11/23 1715  vancomycin (VANCOCIN) IVPB 1000 mg/200 mL premix  Status:  Discontinued        1,000 mg 200 mL/hr over 60 Minutes Intravenous  Once 10/11/23 1714 10/11/23 1729   10/11/23 1715   ceFEPIme (MAXIPIME) 2 g in sodium chloride 0.9 % 100 mL IVPB  Status:  Discontinued        2 g 200 mL/hr over 30 Minutes Intravenous  Once 10/11/23 1714 10/11/23 1729   10/11/23 1715  azithromycin (ZITHROMAX) 500 mg in sodium chloride 0.9 % 250 mL IVPB  Status:  Discontinued        500 mg 250 mL/hr over 60 Minutes Intravenous  Once 10/11/23 1714 10/11/23 1729      Objective   Blood pressure (!) 175/80, pulse 72, temperature 98.3 F (36.8 C), temperature source Axillary, resp. rate 18, height 5\' 1"  (1.549 m), weight 78 kg, SpO2 97%.    Vent Mode: PRVC FiO2 (%):  [30 %-40 %] 30 % Set Rate:  [12 bmp] 12 bmp Vt Set:  [400 mL] 400 mL PEEP:  [5 cmH20] 5 cmH20 Pressure Support:  [5 cmH20-8 cmH20] 5 cmH20 Plateau Pressure:  [21 cmH20-22 cmH20] 22 cmH20   Intake/Output Summary (Last 24 hours) at 10/19/2023 0809 Last data filed at 10/19/2023 0400 Gross per 24 hour  Intake 1017.61 ml  Output 3210 ml  Net -2192.39 ml   Filed Weights   10/11/23 1144 10/18/23 0500 10/19/23 0435  Weight: 78 kg 78 kg 78 kg    Examination: General: Acute on chronically-ill appearing female, NAD mechanically intubated  HENT: Supple, no JVD  Lungs: Clear throughout, even, non labored, overbreathing the vent, right-sided chest tube no crepitus around incision site/dressing dry/intact   Cardiovascular: NSR, s1s2, no m/r/g, 2+ radial/1+ distal pulses, trace generalized edema  Abdomen: +BS x4, obese, soft, non distended  Extremities: Normal bulk and tone, moves all extremities  Neuro:  Lightly Sedated, following commands, PERRL GU: Indwelling foley catheter draining yellow urine   Resolved Hospital Problem list     Assessment & Plan:   #Acute metabolic encephalopathy complicated by dementia  #Sedation needs in setting of mechanical ventilation CT Head 10/17/23: No acute intracranial process  - Correct metabolic derangements  - Maintain a RASS goal of 0 to -1 - PAD protocol to maintain RASS goal: Will  discontinue propofol gtt; start precedex gtt/prn fentanyl  - Continue scheduled seroquel  - Will attempt to avoid benzo's if able   - Avoid sedating medications as able - Daily wake up assessment   #Undifferentiated Shock: hypovolemic in setting of aggressive diuresis vs septic ~ RESOLVED #Mildly elevated troponin likely secondary demand ischemia  #Acute on chronic HFpEF #Moderate pulmonary hypertension #RLE DVT  PMHx: HTN, HLD, HFpEF Echocardiogram 10/17/23: LVEF 55-60%, grade I DD, RV systolic function and size normal.  Moderate tricuspid valve regurgitation Moderately elevated pulmonary artery systolic pressure, mild MR CTA Chest PE 10/16/23:  No pulmonary embolus identified. Satisfactory endotracheal and enteric tubes. Widespread Bilateral Pneumonia with extensive lower lobe consolidation right > left. Superimposed Large layering pleural effusions, favor transudate. Nonspecific Ascites in the upper abdomen. Diffuse idiopathic skeletal hyperostosis (DISH), widespread spinal ankylosis. Posterior spinal fusion hardware T9 through T12. Aortic Atherosclerosis (ICD10-I70.0). - Continuous telemetry monitoring - Prn levophed gtt to maintain map 65 or higher  - HS Troponin until peaked 256.3  - Will diurese today 60 mg iv x1 dose  - Hold outpatient antihypertensives for now due to hypotension  - Argatroban  for anticoagulation   #Acute hypoxic hypercapnic respiratory failure  #AECOPD #Pneumonia  #Bilateral pleural effusions s/p right-sided chest tube placement  - Full vent support, implement lung protective strategies - Plateau pressures less than 30 cm H20 - Wean FiO2 & PEEP as tolerated to maintain O2 sats 88 to 92% - Follow intermittent Chest X-ray & ABG as needed - SBT as respiratory status and mentation permits  - VAP bundle implemented  - Scheduled and nebulized steroids  - IV steroids: wean as tolerated  - Right-sided chest tube to -20 cm H2O wall suction for now - If respiratory  status does not improve with diuresis pt may require left-sided thoracentesis   #Mild hypokalemia - Trend BMP  - Replace electrolytes as indicated ~ Pharmacy following for assistance with electrolyte replacement - Strict I's&O's   #Pneumonia  - Trend WBC and monitor fever curve  - Trend PCT  - Follow cultures as outlined above  - Continue zosyn pending culture results and sensitivities   Thrombocytopenia, ? HIT -Monitor for S/Sx of bleeding -Trend CBC -Start Argatroban for Anticoagulation/VTE Prophylaxis  -Transfuse for Hgb <7 -Send off HIT antibody and Serotonin release assay -Check DIC panel  #Diabetes Mellitus - CBG's q4h; Target range of 140 to 180 - SSI - Follow ICU hypo/hyperglycemia protocol     Best Practice (right click and "Reselect all SmartList Selections" daily)   Diet/type: TF's   DVT prophylaxis: Argatroban GI prophylaxis: PPI Lines: PICC line and still needed Foley:  Yes, and it is still needed Code Status:  full code Last date of multidisciplinary goals of care discussion [10/19/2023]  11/1: Pts son updated at bedside by ICU Intensivist regarding plan of care and pts condition   Labs   CBC: Recent Labs  Lab 10/15/23 0457 10/16/23 1147 10/17/23 0255 10/17/23 1822 10/18/23 0501 10/19/23 0336  WBC 6.2 7.5 9.0  --  4.6 5.3  HGB 12.1 12.9 12.2  --  9.9* 11.6*  HCT 40.9 44.7 41.7 32.2* 33.3* 39.1  MCV 87.6 88.2 87.1  --  85.8 85.6  PLT 149* 152 141*  --  106* 73*    Basic Metabolic Panel: Recent Labs  Lab 10/15/23 0457 10/16/23 0426 10/16/23 1147 10/16/23 1939 10/17/23 0453 10/17/23 1207 10/18/23 0501 10/18/23 1400 10/18/23 1614 10/18/23 1956 10/19/23 0336  NA 145 143 145  --  143  --  141  --   --   --  145  K 3.2* 3.2* 3.3* 3.8 3.7  --  2.8* 6.9* 3.4* 3.4* 3.5  CL 95* 90* 92*  --  95*  --  95*  --   --   --  97*  CO2 38* 40* 41*  --  35*  --  36*  --   --   --  38*  GLUCOSE 168* 136* 159*  --  157*  --  171*  --   --   --  206*   BUN 12 15 18   --  31*  --  40*  --   --   --  24*  CREATININE 0.36* 0.56 0.60  --  0.79 0.77 0.71  --   --   --  0.45  CALCIUM 8.8* 8.6* 8.9  --  8.4*  --  8.0*  --   --   --  8.3*  MG 2.2  --   --  2.0 2.1  --  2.1  --   --   --  1.9  PHOS  --   --   --  5.1* 4.9*  --  3.9  --   --   --  1.6*   GFR: Estimated Creatinine Clearance: 56.6 mL/min (by C-G formula based on SCr of 0.45 mg/dL). Recent Labs  Lab 10/16/23 1147 10/16/23 1637 10/17/23 0255 10/18/23 0501 10/19/23 0336  PROCALCITON 0.45  --  0.90 0.57  --   WBC 7.5  --  9.0 4.6 5.3  LATICACIDVEN 2.8* 1.2  --   --   --     Liver Function Tests: Recent Labs  Lab 10/16/23 1147 10/17/23 0453 10/17/23 1822 10/18/23 0501 10/19/23 0336  AST 20  --   --   --   --   ALT 15  --   --   --   --   ALKPHOS 83  --   --   --   --   BILITOT 0.9  --   --   --   --   PROT 6.8  --   --   --   --   ALBUMIN 3.4* 2.7* 2.5* 2.4* 2.7*   No results for input(s): "LIPASE", "AMYLASE" in the last 168 hours. No results for input(s): "AMMONIA" in the last 168 hours.  ABG    Component Value Date/Time   PHART 7.44 10/18/2023 0255   PCO2ART 63 (H) 10/18/2023 0255   PO2ART 105 10/18/2023 0255   HCO3 42.8 (H) 10/18/2023 0255   O2SAT 99 10/18/2023 0255     Coagulation Profile: Recent Labs  Lab 10/16/23 1939  INR 1.1    Cardiac Enzymes: No results for input(s): "CKTOTAL", "CKMB", "CKMBINDEX", "TROPONINI" in the last 168 hours.  HbA1C: Hemoglobin A1C  Date/Time Value Ref Range Status  07/13/2016 12:00 AM 7.9  Final   HB A1C (BAYER DCA - WAIVED)  Date/Time Value Ref Range Status  08/15/2022 04:25 PM 9.0 (H) 4.8 - 5.6 % Final    Comment:             Prediabetes: 5.7 - 6.4          Diabetes: >6.4          Glycemic control for adults with diabetes: <7.0   01/05/2021 11:12 AM 9.4 (H) <7.0 % Final    Comment:                                          Diabetic Adult            <7.0                                       Healthy Adult         4.3 - 5.7                                                           (DCCT/NGSP) American Diabetes Association's Summary of Glycemic Recommendations for Adults with Diabetes: Hemoglobin A1c <7.0%. More stringent glycemic goals (A1c <6.0%) may further reduce complications at the cost of increased risk of hypoglycemia.    Hgb A1c MFr Bld  Date/Time Value Ref Range Status  10/11/2023 11:49 AM 6.2 (H) 4.8 - 5.6 %  Final    Comment:    (NOTE) Pre diabetes:          5.7%-6.4%  Diabetes:              >6.4%  Glycemic control for   <7.0% adults with diabetes   05/07/2023 01:50 PM 11.1 (H) 4.8 - 5.6 % Final    Comment:             Prediabetes: 5.7 - 6.4          Diabetes: >6.4          Glycemic control for adults with diabetes: <7.0     CBG: Recent Labs  Lab 10/18/23 1153 10/18/23 1539 10/18/23 2003 10/18/23 2303 10/19/23 0426  GLUCAP 266* 252* 204* 250* 204*    Review of Systems:   Unable to assess pt mechanically intubated   Past Medical History:  She,  has a past medical history of Arthritis, COPD (chronic obstructive pulmonary disease) (HCC), Diabetes mellitus without complication (HCC), Dyspnea, Hyperlipidemia, Hypertension, Neuromuscular disorder (HCC), Shoulder pain, right, and Wears dentures.   Surgical History:   Past Surgical History:  Procedure Laterality Date   BREAST BIOPSY Right 04/12/2016   stereo bx/clip- neg   CARPAL TUNNEL RELEASE  06/17/2000   CATARACT EXTRACTION W/PHACO Left 09/20/2015   Procedure: CATARACT EXTRACTION PHACO AND INTRAOCULAR LENS PLACEMENT (IOC);  Surgeon: Sherald Hess, MD;  Location[facility];  Service: Ophthalmology;  Laterality: Left;  DIABETIC - insulin   CATARACT EXTRACTION W/PHACO Right 06/03/2018   Procedure: CATARACT EXTRACTION PHACO AND INTRAOCULAR LENS PLACEMENT (IOC) RIGHT DIABETES;  Surgeon: Nevada Crane, MD;  Location: [facility];  Service: Ophthalmology;  Laterality: Right;   Diabetes-insulin dependent   CESAREAN SECTION     x2   CHOLECYSTECTOMY     EYE SURGERY     TOTAL ABDOMINAL HYSTERECTOMY W/ BILATERAL SALPINGOOPHORECTOMY       Social History:   reports that she quit smoking about 10 years ago. Her smoking use included cigarettes. She started smoking about 30 years ago. She has a 20 pack-year smoking history. She has never used smokeless tobacco. She reports that she does not drink alcohol and does not use drugs.   Family History:  Her family history includes Cancer in her brother and sister; Heart disease in her father. There is no history of Breast cancer.   Allergies Allergies  Allergen Reactions   Empagliflozin    Actos [Pioglitazone] Other (See Comments)    CHF   Amlodipine Swelling    Leg swelling    Benazepril Other (See Comments)    angioedema   Hydrochlorothiazide Other (See Comments)    angioedema   Metformin And Related Diarrhea     Home Medications  Prior to Admission medications   Medication Sig Start Date End Date Taking? Authorizing Provider  acetaminophen (TYLENOL) 500 MG tablet Take 1,000 mg by mouth 3 (three) times daily.   Yes [provider]  albuterol (PROVENTIL) (2.5 MG/3ML) 0.083% nebulizer solution Take 2.5 mg by nebulization 4 (four) times daily.   Yes [provider]  amLODipine (NORVASC) 10 MG tablet Take 1 tablet (10 mg total) by mouth daily. 05/07/23  Yes Larae Grooms, NP  atenolol (TENORMIN) 100 MG tablet Take 1 tablet (100 mg total) by mouth daily. 05/07/23  Yes Larae Grooms, NP  atorvastatin (LIPITOR) 10 MG tablet Take 10 mg by mouth daily.   Yes [provider]  donepezil (ARICEPT) 5 MG tablet Take 5 mg  by mouth at bedtime. 03/05/23 10/11/23 Yes [provider]  folic acid (FOLVITE) 400 MCG tablet Take 400 mcg by mouth daily.   Yes [provider]  gabapentin (NEURONTIN) 300 MG capsule Take 300 mg by mouth 3 (three) times daily.   Yes [provider]   insulin lispro (HUMALOG KWIKPEN) 100 UNIT/ML KwikPen Inject 0-6 Units into the skin 4 (four) times daily -  before meals and at bedtime.   Yes [provider]  ipratropium (ATROVENT) 0.02 % nebulizer solution Take 0.5 mg by nebulization 4 (four) times daily.   Yes [provider]  lidocaine 4 % Place 1 patch onto the skin daily.   Yes [provider]  methocarbamol (ROBAXIN) 750 MG tablet Take 750 mg by mouth 4 (four) times daily.   Yes [provider]  polyethylene glycol (MIRALAX / GLYCOLAX) 17 g packet Take 17 g by mouth 2 (two) times daily.   Yes [provider]  senna (SENOKOT) 8.6 MG TABS tablet Take 2 tablets by mouth 2 (two) times daily.   Yes [provider]  sertraline (ZOLOFT) 25 MG tablet Take 1 tablet (25 mg total) by mouth daily. You can increase to 50 mg in 2 weeks if you are still having mood symptoms. 06/29/23  Yes Mecum, Erin E, PA-C  tamsulosin (FLOMAX) 0.4 MG CAPS capsule Take 0.4 mg by mouth at bedtime.   Yes [provider]  BD PEN NEEDLE MICRO U/F 32G X 6 MM MISC USE 4 TO 5 TIMES DAILY WITH INSULIN USE 10/24/21   Laural Benes, Megan P, DO  Blood Glucose Monitoring Suppl (ONE TOUCH ULTRA 2) w/Device KIT 1 each by Does not apply route 2 (two) times daily. 05/15/23   Larae Grooms, NP  Blood Glucose Monitoring Suppl (ONETOUCH VERIO) w/Device KIT Use to check blood sugar 2-3 times daily and document, bring results to provider visits. 06/12/22   Larae Grooms, NP  Leda Min 5-2.5-18.5 LF-MCG/0.5 injection  05/13/23   [provider]  glucose blood (ONETOUCH ULTRA) test strip 1 each by Other route 2 (two) times daily. Use as instructed 05/15/23   Larae Grooms, NP  glucose blood test strip Use to check blood sugar 2-3 times daily and document, bring results to provider visits. 05/23/22   Larae Grooms, NP  hydrALAZINE (APRESOLINE) 10 MG tablet Take 1 tablet (10 mg total) by mouth 2 (two) times daily. Patient  not taking: Reported on 10/11/2023 05/07/23   Larae Grooms, NP  insulin degludec (TRESIBA FLEXTOUCH) 100 UNIT/ML FlexTouch Pen INJECT 40 UNITS SUBCUTANEOUSLY ONCE DAILY Patient not taking: Reported on 10/11/2023 05/02/23   Larae Grooms, NP  Lancets [facility] ULTRASOFT) lancets Use to check blood sugar 2-3 times daily and document, bring results to provider visits. 06/12/22   Larae Grooms, NP  Lancets [facility] ULTRASOFT) lancets 1 each by Other route 2 (two) times daily. Use as instructed 05/15/23   Larae Grooms, NP  lovastatin (MEVACOR) 40 MG tablet Take 1 tablet (40 mg total) by mouth daily with breakfast. Patient not taking: Reported on 10/11/2023 05/07/23   Larae Grooms, NP  OneTouch Delica Lancets 33G MISC 1 applicator by Does not apply route in the morning and at bedtime. 05/23/22   Larae Grooms, NP  Semaglutide,0.25 or 0.5MG /DOS, (OZEMPIC, 0.25 OR 0.5 MG/DOSE,) 2 MG/3ML SOPN Inject 0.25 mg into the skin once a week. Patient not taking: Reported on 10/11/2023 05/28/23   Larae Grooms, NP  spironolactone (ALDACTONE) 25 MG tablet Take 1 tablet (25 mg total)  by mouth daily. 05/07/23   Larae Grooms, NP  VITAMIN D, CHOLECALCIFEROL, PO Take 5,000 Units by mouth daily. Patient not taking: Reported on 10/11/2023    [provider]     Critical care time: 40 minutes     [name], AGACNP-BC [facility] Prefer epic messenger for cross cover needs If after hours, please call E-link

## 2023-10-19 NOTE — Plan of Care (Signed)
Continuing with plan of care. 

## 2023-10-19 NOTE — Consult Note (Signed)
PHARMACY - ANTICOAGULATION CONSULT NOTE  Pharmacy Consult for Argatroban  Indication: DVT  Allergies  Allergen Reactions   Empagliflozin    Actos [Pioglitazone] Other (See Comments)    CHF   Amlodipine Swelling    Leg swelling    Benazepril Other (See Comments)    angioedema   Hydrochlorothiazide Other (See Comments)    angioedema   Metformin And Related Diarrhea   Patient Measurements: Height: 5\' 1"  (154.9 cm) Weight: 78 kg (171 lb 15.3 oz) IBW/kg (Calculated) : 47.8  Vital Signs: Temp: 98.3 F (36.8 C) (11/01 1700) Temp Source: Oral (11/01 1700) BP: 134/70 (11/01 1900) Pulse Rate: 81 (11/01 1900)  Labs: Recent Labs    10/17/23 1207 10/17/23 1502 10/17/23 1710 10/17/23 1822 10/18/23 0501 10/18/23 1614 10/19/23 0336 10/19/23 1105 10/19/23 1106 10/19/23 1401 10/19/23 2014  HGB  --   --   --   --  9.9*  --  11.6* 11.4*  --   --   --   HCT  --   --   --    < > 33.3*  --  39.1 37.0  --   --   --   PLT  --   --   --   --  106*  --  73* 70*  --   --   --   APTT  --   --   --   --   --   --   --   --  38* 39* 48*  LABPROT  --   --   --   --   --   --   --   --  16.4*  --   --   INR  --   --   --   --   --   --   --   --  1.3*  --   --   HEPARINUNFRC 0.61  --   --   --  >1.10* 0.24* 0.30  --  <0.10*  --   --   CREATININE 0.77  --   --   --  0.71  --  0.45  --   --   --   --   TROPONINIHS 50* 60* 60*  --   --   --   --   --   --   --   --    < > = values in this interval not displayed.   Estimated Creatinine Clearance: 56.6 mL/min (by C-G formula based on SCr of 0.45 mg/dL).  Medical History: Past Medical History:  Diagnosis Date   Arthritis    knee   COPD (chronic obstructive pulmonary disease) (HCC)    Diabetes mellitus without complication (HCC)    type 2   Dyspnea    Hyperlipidemia    Hypertension    controlled   Neuromuscular disorder (HCC)    numbness in hands and feet   Shoulder pain, right    Wears dentures    upper and lower   Medications:   Patient was on enoxaparin 40 mg SubQ q24h, last dose 10/28 @ 2128   Assessment: 77 yo female presented to [facility] due to acute respiratory failure. PMG includes T2DM, HFpEF, and dementia. Patient was started on heparin infusion on 10/29. Platelets have down trended since infusion was started and at 73 today. Pharmacy has been consulted for management of argatroban infusion.   Goal of Therapy:  aPTT 50-90 seconds  Monitor platelets by anticoagulation protocol: Yes  Labs: 11/01 1401 aPTT = 39; subtherapeutic  11/01 2014 aPTT = 48, subtherapeutic  Plan:  aPTT subtherapeutic  Increase argatroban infusion to 60.8 mcg/min  Check aPTT 4 hours after rate adjustment  CBC daily while on argatroban  F/u HITT testing   Clovia Cuff, PharmD, BCPS 10/19/2023 8:58 PM

## 2023-10-19 NOTE — Progress Notes (Signed)
Patient extubated and placed on HHFNC per MD order @ 1305.

## 2023-10-19 NOTE — Progress Notes (Signed)
Orders placed for extubation by Dr. Aundria Rud, patient extubated by RT to heated high flow nasal cannula.

## 2023-10-19 NOTE — Consult Note (Signed)
PHARMACY - ANTICOAGULATION CONSULT NOTE  Pharmacy Consult for heparin infusion Indication: DVT  Allergies  Allergen Reactions   Empagliflozin    Actos [Pioglitazone] Other (See Comments)    CHF   Amlodipine Swelling    Leg swelling    Benazepril Other (See Comments)    angioedema   Hydrochlorothiazide Other (See Comments)    angioedema   Metformin And Related Diarrhea    Patient Measurements: Height: 5\' 1"  (154.9 cm) Weight: 78 kg (171 lb 15.3 oz) IBW/kg (Calculated) : 47.8 Heparin Dosing Weight: 65.2 kg  Vital Signs: Temp: 98.3 F (36.8 C) (11/01 0400) Temp Source: Axillary (11/01 0400) BP: 172/61 (11/01 0400) Pulse Rate: 72 (11/01 0400)  Labs: Recent Labs    10/16/23 1147 10/16/23 1939 10/17/23 0255 10/17/23 0453 10/17/23 1207 10/17/23 1502 10/17/23 1710 10/17/23 1822 10/18/23 0501 10/18/23 1614 10/19/23 0336  HGB  --   --  12.2  --   --   --   --   --  9.9*  --  11.6*  HCT  --   --  41.7  --   --   --   --  32.2* 33.3*  --  39.1  PLT  --   --  141*  --   --   --   --   --  106*  --  73*  APTT  --  27  --   --   --   --   --   --   --   --   --   LABPROT  --  14.1  --   --   --   --   --   --   --   --   --   INR  --  1.1  --   --   --   --   --   --   --   --   --   HEPARINUNFRC   < >  --  0.51  --  0.61  --   --   --  >1.10* 0.24* 0.30  CREATININE  --   --   --    < > 0.77  --   --   --  0.71  --  0.45  TROPONINIHS  --   --   --   --  50* 60* 60*  --   --   --   --    < > = values in this interval not displayed.    Estimated Creatinine Clearance: 56.6 mL/min (by C-G formula based on SCr of 0.45 mg/dL).   Medical History: Past Medical History:  Diagnosis Date   Arthritis    knee   COPD (chronic obstructive pulmonary disease) (HCC)    Diabetes mellitus without complication (HCC)    type 2   Dyspnea    Hyperlipidemia    Hypertension    controlled   Neuromuscular disorder (HCC)    numbness in hands and feet   Shoulder pain, right    Wears  dentures    upper and lower    Medications:  Patient was on enoxaparin 40 mg SubQ q24h, last dose 10/28 @ 2128  Assessment: 77 yo female presented to [facility] due to acute respiratory failure. PMG includes T2DM, HFpEF, and dementia.  Pharmacy consulted to start heparin infusion for possible PE.   Goal of Therapy:  Heparin level 0.3-0.7 units/ml Monitor platelets by anticoagulation protocol: Yes   Plan: heparin level therapeutic x 1 ---Continue  heparin infusion rate to 1050 units/hr  ---Recheck heparin level in 8 hours to confirm ---CBC daily while on heparin  Otelia Sergeant, PharmD, [facility] 10/19/2023 5:11 AM

## 2023-10-19 NOTE — Consult Note (Signed)
Pharmacy Heparin Induced Thrombocytopenia (HIT) Note:  Tami Cummings is an 77 y.o. female being evaluated for HIT. Heparin was started 10/29 for DVT and baseline platelets were 200s. HIT labs were ordered on 11/1 when platelets dropped to 73.   CALCULATE SCORE:  4Ts (see the HIT Algorithm) Score  Thrombocytopenia 2  Timing 1  Thrombosis 2  Other causes of thrombocytopenia 1  Total 6   Recommendations (A or B) are based on available lab results (HIT antibody and/or SRA) and the HIT algorithm   A. HIT antibody result pending  B. SRA result pending   Name of MD Contacted: Dr. Aundria Rud   Plan (Discussed with provider) Labs ordered: SRA, HIT antibody Anticoagulation plans:  Discontinue heparin infusion and start argatroban infusion   Tami Cummings, PharmD Pharmacy Resident  10/19/2023 10:52 AM

## 2023-10-19 NOTE — Consult Note (Signed)
PHARMACY - ANTICOAGULATION CONSULT NOTE  Pharmacy Consult for Argatroban  Indication: DVT  Allergies  Allergen Reactions   Empagliflozin    Actos [Pioglitazone] Other (See Comments)    CHF   Amlodipine Swelling    Leg swelling    Benazepril Other (See Comments)    angioedema   Hydrochlorothiazide Other (See Comments)    angioedema   Metformin And Related Diarrhea   Patient Measurements: Height: 5\' 1"  (154.9 cm) Weight: 78 kg (171 lb 15.3 oz) IBW/kg (Calculated) : 47.8  Vital Signs: Temp: 98.3 F (36.8 C) (11/01 0800) Temp Source: Axillary (11/01 0800) BP: 160/78 (11/01 1313) Pulse Rate: 83 (11/01 1415)  Labs: Recent Labs    10/16/23 1939 10/17/23 0255 10/17/23 1207 10/17/23 1502 10/17/23 1710 10/17/23 1822 10/18/23 0501 10/18/23 1614 10/19/23 0336 10/19/23 1105 10/19/23 1106 10/19/23 1401  HGB  --    < >  --   --   --   --  9.9*  --  11.6* 11.4*  --   --   HCT  --    < >  --   --   --    < > 33.3*  --  39.1 37.0  --   --   PLT  --    < >  --   --   --   --  106*  --  73* 70*  --   --   APTT 27  --   --   --   --   --   --   --   --   --  38* 39*  LABPROT 14.1  --   --   --   --   --   --   --   --   --  16.4*  --   INR 1.1  --   --   --   --   --   --   --   --   --  1.3*  --   HEPARINUNFRC  --    < > 0.61  --   --   --  >1.10* 0.24* 0.30  --  <0.10*  --   CREATININE  --    < > 0.77  --   --   --  0.71  --  0.45  --   --   --   TROPONINIHS  --   --  50* 60* 60*  --   --   --   --   --   --   --    < > = values in this interval not displayed.   Estimated Creatinine Clearance: 56.6 mL/min (by C-G formula based on SCr of 0.45 mg/dL).  Medical History: Past Medical History:  Diagnosis Date   Arthritis    knee   COPD (chronic obstructive pulmonary disease) (HCC)    Diabetes mellitus without complication (HCC)    type 2   Dyspnea    Hyperlipidemia    Hypertension    controlled   Neuromuscular disorder (HCC)    numbness in hands and feet   Shoulder  pain, right    Wears dentures    upper and lower   Medications:  Patient was on enoxaparin 40 mg SubQ q24h, last dose 10/28 @ 2128   Assessment: 77 yo female presented to [facility] due to acute respiratory failure. PMG includes T2DM, HFpEF, and dementia. Patient was started on heparin infusion on 10/29. Platelets have down trended since infusion was started  and at 65 today. Pharmacy has been consulted for management of argatroban infusion.   Goal of Therapy:  aPTT 50-90 seconds  Monitor platelets by anticoagulation protocol: Yes  Labs: 11/01 1401 aPTT = 39; subtherapeutic   Plan:  aPTT subtherapeutic  Increase argatroban infusion to 50 mcg/min  Check aPTT 4 hours after rate adjustment  CBC daily while on argatroban  F/u HITT testing   Littie Deeds, PharmD Pharmacy Resident  10/19/2023 3:33 PM

## 2023-10-19 NOTE — IPAL (Signed)
  Interdisciplinary Goals of Care Family Meeting   Date carried out: 10/19/2023  Location of the meeting: Bedside  Member's involved: Physician and Family Member or next of kin  Durable Power of Attorney or Environmental health practitioner: Patient, patient's son, and patient's daughter.    Discussion: We discussed goals of care for [facility] .  Patient was interviewed at the bedside following extubation in the presence of her daughter and son. She reported that she would want to be re-intubated should her respiratory status were to deteriorate. States she would want to live to spend more time with her children. She would not want to be intubated for a prolonged period of time, but would want reasonable measures to save her life. Would not want to be kept alive in a vegetative state.  Code status:   Code Status: Full Code   Disposition: Continue current acute care  Time spent for the meeting: 15 mintues    Raechel Chute, MD  10/19/2023, 6:53 PM

## 2023-10-19 NOTE — Consult Note (Signed)
PHARMACY - ANTICOAGULATION CONSULT NOTE  Pharmacy Consult for Argatroban  Indication: DVT  Allergies  Allergen Reactions   Empagliflozin    Actos [Pioglitazone] Other (See Comments)    CHF   Amlodipine Swelling    Leg swelling    Benazepril Other (See Comments)    angioedema   Hydrochlorothiazide Other (See Comments)    angioedema   Metformin And Related Diarrhea   Patient Measurements: Height: 5\' 1"  (154.9 cm) Weight: 78 kg (171 lb 15.3 oz) IBW/kg (Calculated) : 47.8  Vital Signs: Temp: 98.3 F (36.8 C) (11/01 0400) Temp Source: Axillary (11/01 0400) BP: 175/80 (11/01 0600) Pulse Rate: 72 (11/01 0600)  Labs: Recent Labs    10/16/23 1147 10/16/23 1939 10/17/23 0255 10/17/23 0453 10/17/23 1207 10/17/23 1502 10/17/23 1710 10/17/23 1822 10/18/23 0501 10/18/23 1614 10/19/23 0336  HGB  --   --  12.2  --   --   --   --   --  9.9*  --  11.6*  HCT  --   --  41.7  --   --   --   --  32.2* 33.3*  --  39.1  PLT  --   --  141*  --   --   --   --   --  106*  --  73*  APTT  --  27  --   --   --   --   --   --   --   --   --   LABPROT  --  14.1  --   --   --   --   --   --   --   --   --   INR  --  1.1  --   --   --   --   --   --   --   --   --   HEPARINUNFRC   < >  --  0.51  --  0.61  --   --   --  >1.10* 0.24* 0.30  CREATININE  --   --   --    < > 0.77  --   --   --  0.71  --  0.45  TROPONINIHS  --   --   --   --  50* 60* 60*  --   --   --   --    < > = values in this interval not displayed.   Estimated Creatinine Clearance: 56.6 mL/min (by C-G formula based on SCr of 0.45 mg/dL).  Medical History: Past Medical History:  Diagnosis Date   Arthritis    knee   COPD (chronic obstructive pulmonary disease) (HCC)    Diabetes mellitus without complication (HCC)    type 2   Dyspnea    Hyperlipidemia    Hypertension    controlled   Neuromuscular disorder (HCC)    numbness in hands and feet   Shoulder pain, right    Wears dentures    upper and lower    Medications:  Patient was on enoxaparin 40 mg SubQ q24h, last dose 10/28 @ 2128   Assessment: 76 yo female presented to [facility] due to acute respiratory failure. PMG includes T2DM, HFpEF, and dementia. Patient was started on heparin infusion on 10/29. Platelets have down trended since infusion was started and at 73 today. Pharmacy has been consulted for management of argatroban infusion.   Goal of Therapy:  aPTT 50-90 seconds  Monitor platelets by anticoagulation  protocol: Yes   Plan:  Start argatroban infusion at 39 mcg/min (0.5 mcg/kg/min) once heparin gtt is turned off  Check aPTT 4 hours after initiation of argatroban  CBC daily while on argatroban  F/u HITT testing   Littie Deeds, PharmD Pharmacy Resident  10/19/2023 8:00 AM

## 2023-10-20 ENCOUNTER — Inpatient Hospital Stay: Payer: 59

## 2023-10-20 DIAGNOSIS — J189 Pneumonia, unspecified organism: Secondary | ICD-10-CM | POA: Diagnosis not present

## 2023-10-20 DIAGNOSIS — I5033 Acute on chronic diastolic (congestive) heart failure: Secondary | ICD-10-CM | POA: Diagnosis not present

## 2023-10-20 DIAGNOSIS — J9621 Acute and chronic respiratory failure with hypoxia: Secondary | ICD-10-CM | POA: Diagnosis not present

## 2023-10-20 DIAGNOSIS — J90 Pleural effusion, not elsewhere classified: Secondary | ICD-10-CM | POA: Diagnosis not present

## 2023-10-20 LAB — BASIC METABOLIC PANEL
Anion gap: 12 (ref 5–15)
BUN: 14 mg/dL (ref 8–23)
CO2: 37 mmol/L — ABNORMAL HIGH (ref 22–32)
Calcium: 8.1 mg/dL — ABNORMAL LOW (ref 8.9–10.3)
Chloride: 94 mmol/L — ABNORMAL LOW (ref 98–111)
Creatinine, Ser: 0.42 mg/dL — ABNORMAL LOW (ref 0.44–1.00)
GFR, Estimated: >60 mL/min (ref >60)
Glucose, Bld: 165 mg/dL — ABNORMAL HIGH (ref 70–99)
Potassium: 3.1 mmol/L — ABNORMAL LOW (ref 3.5–5.1)
Sodium: 143 mmol/L (ref 135–145)

## 2023-10-20 LAB — RENAL FUNCTION PANEL
Albumin: 2.6 g/dL — ABNORMAL LOW (ref 3.5–5.0)
Anion gap: 12 (ref 5–15)
BUN: 16 mg/dL (ref 8–23)
CO2: 38 mmol/L — ABNORMAL HIGH (ref 22–32)
Calcium: 8.2 mg/dL — ABNORMAL LOW (ref 8.9–10.3)
Chloride: 94 mmol/L — ABNORMAL LOW (ref 98–111)
Creatinine, Ser: 0.33 mg/dL — ABNORMAL LOW (ref 0.44–1.00)
GFR, Estimated: >60 mL/min (ref >60)
Glucose, Bld: 110 mg/dL — ABNORMAL HIGH (ref 70–99)
Phosphorus: 2.6 mg/dL (ref 2.5–4.6)
Potassium: 2.6 mmol/L — CL (ref 3.5–5.1)
Sodium: 144 mmol/L (ref 135–145)

## 2023-10-20 LAB — MAGNESIUM: Magnesium: 2.1 mg/dL (ref 1.7–2.4)

## 2023-10-20 LAB — APTT
aPTT: 37 s — ABNORMAL HIGH (ref 24–36)
aPTT: 50 s — ABNORMAL HIGH (ref 24–36)
aPTT: 51 s — ABNORMAL HIGH (ref 24–36)

## 2023-10-20 LAB — GLUCOSE, CAPILLARY
Glucose-Capillary: 102 mg/dL — ABNORMAL HIGH (ref 70–99)
Glucose-Capillary: 102 mg/dL — ABNORMAL HIGH (ref 70–99)
Glucose-Capillary: 116 mg/dL — ABNORMAL HIGH (ref 70–99)
Glucose-Capillary: 118 mg/dL — ABNORMAL HIGH (ref 70–99)
Glucose-Capillary: 139 mg/dL — ABNORMAL HIGH (ref 70–99)
Glucose-Capillary: 141 mg/dL — ABNORMAL HIGH (ref 70–99)

## 2023-10-20 LAB — CBC
HCT: 34.8 % — ABNORMAL LOW (ref 36.0–46.0)
Hemoglobin: 10.7 g/dL — ABNORMAL LOW (ref 12.0–15.0)
MCH: 26.3 pg (ref 26.0–34.0)
MCHC: 30.7 g/dL (ref 30.0–36.0)
MCV: 85.5 fL (ref 80.0–100.0)
Platelets: 64 10*3/uL — ABNORMAL LOW (ref 150–400)
RBC: 4.07 MIL/uL (ref 3.87–5.11)
RDW: 14.5 % (ref 11.5–15.5)
WBC: 6.6 10*3/uL (ref 4.0–10.5)
nRBC: 0 % (ref 0.0–0.2)

## 2023-10-20 MED ORDER — POTASSIUM CHLORIDE 10 MEQ/50ML IV SOLN
10.0000 meq | INTRAVENOUS | Status: AC
  Administered 2023-10-20 (×6): 10 meq via INTRAVENOUS
  Filled 2023-10-20 (×6): qty 50

## 2023-10-20 MED ORDER — POLYETHYLENE GLYCOL 3350 17 G PO PACK
17.0000 g | PACK | Freq: Every day | ORAL | Status: DC
Start: 2023-10-21 — End: 2023-10-22
  Filled 2023-10-20: qty 1

## 2023-10-20 MED ORDER — FUROSEMIDE 10 MG/ML IJ SOLN
80.0000 mg | Freq: Once | INTRAMUSCULAR | Status: AC
  Administered 2023-10-20: 80 mg via INTRAVENOUS
  Filled 2023-10-20: qty 8

## 2023-10-20 MED ORDER — ADULT MULTIVITAMIN W/MINERALS CH
1.0000 | ORAL_TABLET | Freq: Every day | ORAL | Status: DC
  Administered 2023-10-23 – 2023-10-26 (×4): 1 via ORAL
  Filled 2023-10-20 (×5): qty 1

## 2023-10-20 MED ORDER — POTASSIUM CHLORIDE 10 MEQ/50ML IV SOLN
10.0000 meq | INTRAVENOUS | Status: AC
  Administered 2023-10-20 (×5): 10 meq via INTRAVENOUS
  Filled 2023-10-20 (×6): qty 50

## 2023-10-20 MED ORDER — POTASSIUM CHLORIDE 10 MEQ/50ML IV SOLN
10.0000 meq | INTRAVENOUS | Status: AC
  Administered 2023-10-20 (×2): 10 meq via INTRAVENOUS
  Filled 2023-10-20 (×3): qty 50

## 2023-10-20 MED ORDER — DEXTROSE 50 % IV SOLN: 1.0000 | INTRAVENOUS | Status: DC | PRN

## 2023-10-20 MED ORDER — DOCUSATE SODIUM 100 MG PO CAPS
100.0000 mg | ORAL_CAPSULE | Freq: Two times a day (BID) | ORAL | Status: DC
  Administered 2023-10-22 – 2023-10-26 (×7): 100 mg via ORAL
  Filled 2023-10-20 (×9): qty 1

## 2023-10-20 NOTE — Progress Notes (Signed)
Albumin level 2.6, Potassium 3.1, and Calcium 8.1; Dr. Aundria Rud notified and placing orders.

## 2023-10-20 NOTE — Plan of Care (Signed)
Continuing with plan of care. 

## 2023-10-20 NOTE — Consult Note (Addendum)
PHARMACY - ANTICOAGULATION CONSULT NOTE  Pharmacy Consult for Argatroban  Indication: DVT  Allergies  Allergen Reactions   Empagliflozin    Actos [Pioglitazone] Other (See Comments)    CHF   Amlodipine Swelling    Leg swelling    Benazepril Other (See Comments)    angioedema   Hydrochlorothiazide Other (See Comments)    angioedema   Metformin And Related Diarrhea   Patient Measurements: Height: 5\' 1"  (154.9 cm) Weight: 78 kg (171 lb 15.3 oz) IBW/kg (Calculated) : 47.8  Vital Signs: Temp: 97.7 F (36.5 C) (11/02 1200) Temp Source: Oral (11/02 1200) BP: 130/84 (11/02 1400) Pulse Rate: 87 (11/02 1400)  Labs: Recent Labs    10/17/23 1502 10/17/23 1710 10/17/23 1822 10/18/23 1614 10/19/23 0336 10/19/23 1105 10/19/23 1106 10/19/23 1401 10/20/23 0204 10/20/23 0920 10/20/23 1344  HGB  --   --    < >  --  11.6* 11.4*  --   --  10.7*  --   --   HCT  --   --    < >  --  39.1 37.0  --   --  34.8*  --   --   PLT  --   --    < >  --  73* 70*  --   --  64*  --   --   APTT  --   --   --   --   --   --  38*   < > 37* 50* 51*  LABPROT  --   --   --   --   --   --  16.4*  --   --   --   --   INR  --   --   --   --   --   --  1.3*  --   --   --   --   HEPARINUNFRC  --   --    < > 0.24* 0.30  --  <0.10*  --   --   --   --   CREATININE  --   --    < >  --  0.45  --   --   --  0.33*  --  0.42*  TROPONINIHS 60* 60*  --   --   --   --   --   --   --   --   --    < > = values in this interval not displayed.   Estimated Creatinine Clearance: 56.6 mL/min (A) (by C-G formula based on SCr of 0.42 mg/dL (L)).  Medical History: Past Medical History:  Diagnosis Date   Arthritis    knee   COPD (chronic obstructive pulmonary disease) (HCC)    Diabetes mellitus without complication (HCC)    type 2   Dyspnea    Hyperlipidemia    Hypertension    controlled   Neuromuscular disorder (HCC)    numbness in hands and feet   Shoulder pain, right    Wears dentures    upper and lower    Medications:  Patient was on enoxaparin 40 mg SubQ q24h, last dose 10/28 @ 2128   Assessment: 77 yo female presented to [facility] due to acute respiratory failure. PMG includes T2DM, HFpEF, and dementia. Patient was started on heparin infusion on 10/29. Platelets have down trended since infusion was started and at 73 today. Pharmacy has been consulted for management of argatroban infusion.   Goal of Therapy:  aPTT 50-90 seconds  Monitor platelets by anticoagulation protocol: Yes  Labs: 11/01 1401 aPTT = 39; subtherapeutic 11/01 2014 aPTT = 48, subtherapeutic 11/02 0204 aPTT = 37, subtherapeutic 11/02 0920 aPTT = 50, therapeutic x1 11/02 1344 aPTT = 51, therapeutic x2  Plan:  Continue argatroban infusion at 78.78 mcg/min  Check aPTT BID(0500 and 1700) CBC daily while on argatroban  F/u HITT testing   Bettey Costa, PharmD Clinical Pharmacist 10/20/2023 2:33 PM

## 2023-10-20 NOTE — Consult Note (Signed)
PHARMACY - ANTICOAGULATION CONSULT NOTE  Pharmacy Consult for Argatroban  Indication: DVT  Allergies  Allergen Reactions   Empagliflozin    Actos [Pioglitazone] Other (See Comments)    CHF   Amlodipine Swelling    Leg swelling    Benazepril Other (See Comments)    angioedema   Hydrochlorothiazide Other (See Comments)    angioedema   Metformin And Related Diarrhea   Patient Measurements: Height: 5\' 1"  (154.9 cm) Weight: 78 kg (171 lb 15.3 oz) IBW/kg (Calculated) : 47.8  Vital Signs: Temp: 97.5 F (36.4 C) (11/02 0800) Temp Source: Oral (11/02 0800) BP: 145/62 (11/02 1000) Pulse Rate: 96 (11/02 1000)  Labs: Recent Labs    10/17/23 1207 10/17/23 1502 10/17/23 1710 10/17/23 1822 10/18/23 0501 10/18/23 1614 10/19/23 0336 10/19/23 1105 10/19/23 1106 10/19/23 1401 10/19/23 2014 10/20/23 0204 10/20/23 0920  HGB  --   --   --    < > 9.9*  --  11.6* 11.4*  --   --   --  10.7*  --   HCT  --   --   --    < > 33.3*  --  39.1 37.0  --   --   --  34.8*  --   PLT  --   --   --    < > 106*  --  73* 70*  --   --   --  64*  --   APTT  --   --   --   --   --   --   --   --  38*   < > 48* 37* 50*  LABPROT  --   --   --   --   --   --   --   --  16.4*  --   --   --   --   INR  --   --   --   --   --   --   --   --  1.3*  --   --   --   --   HEPARINUNFRC 0.61  --   --   --  >1.10* 0.24* 0.30  --  <0.10*  --   --   --   --   CREATININE 0.77  --   --   --  0.71  --  0.45  --   --   --   --  0.33*  --   TROPONINIHS 50* 60* 60*  --   --   --   --   --   --   --   --   --   --    < > = values in this interval not displayed.   Estimated Creatinine Clearance: 56.6 mL/min (A) (by C-G formula based on SCr of 0.33 mg/dL (L)).  Medical History: Past Medical History:  Diagnosis Date   Arthritis    knee   COPD (chronic obstructive pulmonary disease) (HCC)    Diabetes mellitus without complication (HCC)    type 2   Dyspnea    Hyperlipidemia    Hypertension    controlled    Neuromuscular disorder (HCC)    numbness in hands and feet   Shoulder pain, right    Wears dentures    upper and lower   Medications:  Patient was on enoxaparin 40 mg SubQ q24h, last dose 10/28 @ 2128   Assessment: 77 yo female presented to [facility] due to acute respiratory  failure. PMG includes T2DM, HFpEF, and dementia. Patient was started on heparin infusion on 10/29. Platelets have down trended since infusion was started and at 73 today. Pharmacy has been consulted for management of argatroban infusion.   Goal of Therapy:  aPTT 50-90 seconds  Monitor platelets by anticoagulation protocol: Yes  Labs: 11/01 1401 aPTT = 39; subtherapeutic 11/01 2014 aPTT = 48, subtherapeutic 11/02 0204 aPTT = 37, subtherapeutic 11/02 0920 aPTT = 50, therapeutic x1  Plan:  Continue argatroban infusion at 78.78 mcg/min  Check confirmatory aPTT in 4 hours  CBC daily while on argatroban  F/u HITT testing   Bettey Costa, PharmD Clinical Pharmacist 10/20/2023 10:51 AM

## 2023-10-20 NOTE — Evaluation (Signed)
Clinical/Bedside Swallow Evaluation Patient Details  Name: Tami Cummings MRN: [id number] Date of Birth: [birth year]  Today's Date: 10/20/2023 Time: SLP Start Time (ACUTE ONLY): 1200 SLP Stop Time (ACUTE ONLY): 1217 SLP Time Calculation (min) (ACUTE ONLY): 17 min  Past Medical History:  Past Medical History:  Diagnosis Date   Arthritis    knee   COPD (chronic obstructive pulmonary disease) (HCC)    Diabetes mellitus without complication (HCC)    type 2   Dyspnea    Hyperlipidemia    Hypertension    controlled   Neuromuscular disorder (HCC)    numbness in hands and feet   Shoulder pain, right    Wears dentures    upper and lower   Past Surgical History:  Past Surgical History:  Procedure Laterality Date   BREAST BIOPSY Right 04/12/2016   stereo bx/clip- neg   CARPAL TUNNEL RELEASE  06/17/2000   CATARACT EXTRACTION W/PHACO Left 09/20/2015   Procedure: CATARACT EXTRACTION PHACO AND INTRAOCULAR LENS PLACEMENT (IOC);  Surgeon: Sherald Hess, MD;  Location: [facility];  Service: Ophthalmology;  Laterality: Left;  DIABETIC - insulin   CATARACT EXTRACTION W/PHACO Right 06/03/2018   Procedure: CATARACT EXTRACTION PHACO AND INTRAOCULAR LENS PLACEMENT (IOC) RIGHT DIABETES;  Surgeon: Nevada Crane, MD;  Location: [facility];  Service: Ophthalmology;  Laterality: Right;  Diabetes-insulin dependent   CESAREAN SECTION     x2   CHOLECYSTECTOMY     EYE SURGERY     TOTAL ABDOMINAL HYSTERECTOMY W/ BILATERAL SALPINGOOPHORECTOMY     HPI:  Tami Cummings is a 77 y.o. female with medical history significant of DM2, dementia who presented from [facility] for concern for possible PNA on 10/11/2023. Patient was admitted for dyspnea and hypoxia. The patient was recently hospitalized (08/31/23 - 09/10/23) for wound dehiscence after back surgery of T9-T12 fusion (done 07/30/23) following an injury that occurred in July of this year. After a washout she  was discharged to a SNF with a PICC line for Vancomycin and Rocephin scheduled to d/c abx 10/11/23. The patient has dementia as baseline and was given versed PRN d/t to agitation. A rapid response was called on the floor (10/16/2023) d/t suspected aspiration. The patient was saturating in the 60-70% with high flow [place] and was subsequently intubated. Pt admitted to critical care with extubation on 10/19/2023.    Assessment / Plan / Recommendation  Clinical Impression  Pt presents as alert, pleasant with mild confusion. When consuming ice chips, thin liquids via straw,w puree and graham crackers, pt demonstrated adequate oropharyngeal abilities. Pt's oral phase was mildly prolonged when consuming graham crackers, suspect this might be related to edentulous status as well as mild confusion/distraction during consumption. When consuming thin liquids via straw, pt had the appearance of a swift swallow response with RR stating between 15-17. Her voice remained clear throughout consumption. At this time, an oral diet appears appropriate with recommendation to begin a dysphagia 2 diet with thin liquids and medication whole in puree. ST will follow for the possibility of further diet upgrade. SLP Visit Diagnosis: Dysphagia, oral phase (R13.11)    Aspiration Risk  No limitations;Mild aspiration risk    Diet Recommendation Dysphagia 2 (Fine chop);Thin liquid    Liquid Administration via: Cup;Straw Medication Administration: Whole meds with puree Supervision: Staff to assist with self feeding;Full supervision/cueing for compensatory strategies Compensations: Minimize environmental distractions;Slow rate;Small sips/bites Postural Changes: Seated upright at 90 degrees;Remain upright for at least 30 minutes after po intake  Other  Recommendations Oral Care Recommendations: Oral care BID    Recommendations for follow up therapy are one component of a multi-disciplinary discharge planning process, led by the  attending physician.  Recommendations may be updated based on patient status, additional functional criteria and insurance authorization.  Follow up Recommendations Follow physician's recommendations for discharge plan and follow up therapies      Assistance Recommended at Discharge  TBD  Functional Status Assessment Patient has had a recent decline in their functional status and/or demonstrates limited ability to make significant improvements in function in a reasonable and predictable amount of time  Frequency and Duration min 2x/week  2 weeks       Prognosis Prognosis for improved oropharyngeal function: Good Barriers to Reach Goals: Cognitive deficits      Swallow Study   General Date of Onset: 10/20/23 HPI: Tami Cummings is a 77 y.o. female with medical history significant of DM2, dementia who presented from [facility] for concern for possible PNA on 10/11/2023. Patient was admitted for dyspnea and hypoxia. The patient was recently hospitalized (08/31/23 - 09/10/23) for wound dehiscence after back surgery of T9-T12 fusion (done 07/30/23) following an injury that occurred in July of this year. After a washout she was discharged to a SNF with a PICC line for Vancomycin and Rocephin scheduled to d/c abx 10/11/23. The patient has dementia as baseline and was given versed PRN d/t to agitation. A rapid response was called on the floor (10/16/2023) d/t suspected aspiration. The patient was saturating in the 60-70% with high flow [place] and was subsequently intubated. Pt admitted to critical care with extubation on 10/19/2023. Type of Study: Bedside Swallow Evaluation Previous Swallow Assessment: none in chart Diet Prior to this Study: NPO Temperature Spikes Noted: No Respiratory Status:  (HFNC) History of Recent Intubation: Yes Total duration of intubation (days): 4 days Date extubated: 10/19/23 Behavior/Cognition: Alert;Cooperative;Pleasant mood;Confused Oral Cavity Assessment:  Within Functional Limits Oral Care Completed by SLP: No Oral Cavity - Dentition: Edentulous Vision: Functional for self-feeding Self-Feeding Abilities: Needs assist;Needs set up Patient Positioning: Upright in bed Baseline Vocal Quality: Normal Volitional Cough: Strong Volitional Swallow: Able to elicit    Oral/Motor/Sensory Function Overall Oral Motor/Sensory Function: Within functional limits   Ice Chips Ice chips: Within functional limits Presentation: Spoon   Thin Liquid Thin Liquid: Within functional limits Presentation: Self Fed;Straw    Nectar Thick Nectar Thick Liquid: Not tested   Honey Thick Honey Thick Liquid: Not tested   Puree Puree: Within functional limits Presentation: Spoon   Solid     Solid: Impaired Presentation: Self Fed Oral Phase Functional Implications: Impaired mastication;Prolonged oral transit      Jamison Yuhasz 10/20/2023,3:04 PM

## 2023-10-20 NOTE — Consult Note (Addendum)
PHARMACY - ANTICOAGULATION CONSULT NOTE  Pharmacy Consult for Argatroban  Indication: DVT  Allergies  Allergen Reactions   Empagliflozin    Actos [Pioglitazone] Other (See Comments)    CHF   Amlodipine Swelling    Leg swelling    Benazepril Other (See Comments)    angioedema   Hydrochlorothiazide Other (See Comments)    angioedema   Metformin And Related Diarrhea   Patient Measurements: Height: 5\' 1"  (154.9 cm) Weight: 78 kg (171 lb 15.3 oz) IBW/kg (Calculated) : 47.8  Vital Signs: Temp: 97.1 F (36.2 C) (11/01 2300) Temp Source: Oral (11/01 2300) BP: 145/63 (11/02 0200) Pulse Rate: 87 (11/02 0200)  Labs: Recent Labs    10/17/23 1207 10/17/23 1502 10/17/23 1710 10/17/23 1822 10/18/23 0501 10/18/23 1614 10/19/23 0336 10/19/23 1105 10/19/23 1106 10/19/23 1106 10/19/23 1401 10/19/23 2014 10/20/23 0204  HGB  --   --   --    < > 9.9*  --  11.6* 11.4*  --   --   --   --  10.7*  HCT  --   --   --    < > 33.3*  --  39.1 37.0  --   --   --   --  34.8*  PLT  --   --   --    < > 106*  --  73* 70*  --   --   --   --  64*  APTT  --   --   --   --   --   --   --   --  38*   < > 39* 48* 37*  LABPROT  --   --   --   --   --   --   --   --  16.4*  --   --   --   --   INR  --   --   --   --   --   --   --   --  1.3*  --   --   --   --   HEPARINUNFRC 0.61  --   --   --  >1.10* 0.24* 0.30  --  <0.10*  --   --   --   --   CREATININE 0.77  --   --   --  0.71  --  0.45  --   --   --   --   --   --   TROPONINIHS 50* 60* 60*  --   --   --   --   --   --   --   --   --   --    < > = values in this interval not displayed.   Estimated Creatinine Clearance: 56.6 mL/min (by C-G formula based on SCr of 0.45 mg/dL).  Medical History: Past Medical History:  Diagnosis Date   Arthritis    knee   COPD (chronic obstructive pulmonary disease) (HCC)    Diabetes mellitus without complication (HCC)    type 2   Dyspnea    Hyperlipidemia    Hypertension    controlled   Neuromuscular  disorder (HCC)    numbness in hands and feet   Shoulder pain, right    Wears dentures    upper and lower   Medications:  Patient was on enoxaparin 40 mg SubQ q24h, last dose 10/28 @ 2128   Assessment: 77 yo female presented to [facility] due to acute respiratory  failure. PMG includes T2DM, HFpEF, and dementia. Patient was started on heparin infusion on 10/29. Platelets have down trended since infusion was started and at 73 today. Pharmacy has been consulted for management of argatroban infusion.   Goal of Therapy:  aPTT 50-90 seconds  Monitor platelets by anticoagulation protocol: Yes  Labs: 11/01 1401 aPTT = 39; subtherapeutic 11/01 2014 aPTT = 48, subtherapeutic 11/02 0204 aPTT = 37, subtherapeutic  Plan:  aPTT subtherapeutic  Increase argatroban infusion to 78.78 mcg/min  Recheck aPTT 4 hours after rate adjustment  CBC daily while on argatroban  F/u HITT testing   Otelia Sergeant, PharmD, [facility] 10/20/2023 3:23 AM

## 2023-10-20 NOTE — Progress Notes (Signed)
NAME:  Tami Cummings, MRN:  [id number], DOB:  03/16/1946, LOS: 9 ADMISSION DATE:  10/11/2023, CHIEF COMPLAINT:  Respiratory Failure   History of Present Illness:  Tami Cummings is a 77 y.o. female with medical history significant of DM2, dementia who presented from [facility] for concern for possible PNA.    Patient was admitted for dyspnea and hypoxia. The patient was recently hospitalized (08/31/23 - 09/10/23) for wound dehiscence after back surgery of T9-T12 fusion (done 07/30/23) following an injury that occurred in July of this year. After a washout she was discharged to a SNF with a PICC line for Vancomycin and Rocephin scheduled to d/c abx 10/11/23.   The patient has dementia as baseline and was given versed PRN d/t to agitation. A rapid response was called on the floor d/t suspected aspiration. The patient was saturating in the 60-70% with high flow [place] and was subsequently intubated.   Please see "Significant Hospital Events" section below for full detailed hospital course.  Pertinent  Medical History  COPD HTN  Type II Diabetes Mellitus  Arthritis   Significant Hospital Events: Including procedures, antibiotic start and stop dates in addition to other pertinent events   10/11/23: admitted to hospital service and treated for acute hypoxic respiratory failure 10/25: Agitated, trying to get OOB 10/26: Confused, agitated, trying to get OOB.  Required Haldol and Zyprexa. 10/27: Confused, refusing medications 10/28: excessive secretions in upper airway requiring suctioning 10/29: Transferred to the ICU due to increased work of breathing and O2 desaturation requiring mechanical ventilation and vasopressor support 10/30: Pt remains mechanically intubated vent settings improving: FiO2 45%/PEEP 10.  Requiring levophed gtt @5  mcg/min and vasopressin @0 .04 units/min.  Right-sided chest tube placed due to large pleural effusion  10/31: Pt no longer requiring vasopressors  and maintain map 65 or higher.  During WUA pts following commands.  Performed SBT, however became tachypneic with accessory muscle use and hypoxic placed back on full support.  Will diurese with 60 mg iv lasix x1 dose.  Will reattempt SBT following diuresis  11/1: On minimal vent support, Diurese again with 60 mg IV Lasix x1 dose then perform SBT as tolerated.  With worsening thrombocytopenia, start Argatroban while ruling out HIT and DIC.  In the afternoon family requests 1 way extubation with transition to Comfort Measures should she not tolerate extubation 11/2: comfortable on hi-flo nasal cannula  Interim History / Subjective:  Patient is confused. Denies pain.  Objective   Blood pressure (!) 162/58, pulse 96, temperature (!) 97.1 F (36.2 C), temperature source Oral, resp. rate (!) 21, height 5\' 1"  (1.549 m), weight 78 kg, SpO2 100%.    Vent Mode: PCV FiO2 (%):  [30 %-80 %] 75 % Set Rate:  [15 bmp] 15 bmp PEEP:  [5 cmH20] 5 cmH20   Intake/Output Summary (Last 24 hours) at 10/20/2023 0906 Last data filed at 10/20/2023 0800 Gross per 24 hour  Intake 1336.55 ml  Output 3730 ml  Net -2393.45 ml   Filed Weights   10/11/23 1144 10/18/23 0500 10/19/23 0435  Weight: 78 kg 78 kg 78 kg    Examination: Physical Exam Constitutional:      General: She is not in acute distress.    Appearance: She is ill-appearing.  Cardiovascular:     Rate and Rhythm: Normal rate and regular rhythm.     Heart sounds: Normal heart sounds.  Pulmonary:     Breath sounds: Rales present. No wheezing or rhonchi.  Abdominal:  Palpations: Abdomen is soft.  Neurological:     Mental Status: She is disoriented.      Assessment & Plan:   77 year old female with history of dementia, diabetes, and recent T9-T-12 fusion complicated by wound dehiscence and infection s/p revision in September who presents to the hospital with increased shortness of breath concerning for pneumonia. Her condition worsened and  she required intubation and mechanical ventilation.   Neurology #Toxic Metabolic Encephalopathy #Dementia  Patient with baseline dementia with waxing and waning mental status. She was intubated for respiratory failure, and subsequently sedated. Extubated yesterday with improvement in mental status, family report she is back to baseline. Continue with behavioral interventions and avoid psychotropic medications.   Cardiovascular #Acute Decompensated Heart Failure with Preserved Ejection Fraction  She has been off vasopressors and LA normalized. TTE with elevated PA pressures consistent with decompensated heart failure. She is continuing to diurese.  -goal MAP > 65 mmHg -will need continued diuresis  Pulmonary #Acute on Chronic Hypoxic and Hypercapnic Respiratory Failure  #bilateral effusions, pneumonia #Pulmonary edema #HAP  Presented with respiratory failure requiring intubation and mechanical ventilation. She has improved following antibiotics and diuresis, with chest tube placed to drain right sided effusion. Extubated yesterday to Hi-Flo, and has done well. Will continue to trend her oxygen supplementation down, and work on mobilizing her and providing pulmonary toilet.   Gastrointestinal - SLP evaluated patient, will follow. SUP d/c   Renal #HypoKalemia  This is in the setting of aggressive diuresis. Repletion as necessary, continue to avoid nephrotoxins and monitor kidney function and I/O's  -continue with IV diuresis   Endocrine - ICU glycemic protocol. Initiated hydrocortisone for adjunctive therapy of severe pneumonia per CAPE-COD trial.   Hem/Onc - Heparin gtt for DVT noted on duplex of the lower extremities with subsequent drop in platelet count. Switch to argatroban, check for HITT.   ID - broad spectrum antibiotics for HAP coverage. Re-checked MRSA. Switched to Ceftriaxone.  Best Practice (right click and "Reselect all SmartList Selections" daily)   Diet/type:  dysphagia diet (see orders) DVT prophylaxis: other argatroban GI prophylaxis: N/A Lines: Central line Foley:  Yes, and it is still needed Code Status:  full code Last date of multidisciplinary goals of care discussion [10/20/2023]  Labs   CBC: Recent Labs  Lab 10/17/23 0255 10/17/23 1822 10/18/23 0501 10/19/23 0336 10/19/23 1105 10/20/23 0204  WBC 9.0  --  4.6 5.3 6.5 6.6  NEUTROABS  --   --   --   --  5.8  --   HGB 12.2  --  9.9* 11.6* 11.4* 10.7*  HCT 41.7 32.2* 33.3* 39.1 37.0 34.8*  MCV 87.1  --  85.8 85.6 83.3 85.5  PLT 141*  --  106* 73* 70* 64*    Basic Metabolic Panel: Recent Labs  Lab 10/16/23 1147 10/16/23 1939 10/17/23 0453 10/17/23 1207 10/18/23 0501 10/18/23 1400 10/18/23 1614 10/18/23 1956 10/19/23 0336 10/19/23 1401 10/19/23 2014 10/20/23 0204  NA 145  --  143  --  141  --   --   --  145  --   --  144  K 3.3*   < > 3.7  --  2.8*   < > 3.4* 3.4* 3.5 2.9*  --  2.6*  CL 92*  --  95*  --  95*  --   --   --  97*  --   --  94*  CO2 41*  --  35*  --  36*  --   --   --  38*  --   --  38*  GLUCOSE 159*  --  157*  --  171*  --   --   --  206*  --   --  110*  BUN 18  --  31*  --  40*  --   --   --  24*  --   --  16  CREATININE 0.60  --  0.79 0.77 0.71  --   --   --  0.45  --   --  0.33*  CALCIUM 8.9  --  8.4*  --  8.0*  --   --   --  8.3*  --   --  8.2*  MG  --    < > 2.1  --  2.1  --   --   --  1.9 1.7  --  2.1  PHOS  --    < > 4.9*  --  3.9  --   --   --  1.6*  --  3.0 2.6   < > = values in this interval not displayed.   GFR: Estimated Creatinine Clearance: 56.6 mL/min (A) (by C-G formula based on SCr of 0.33 mg/dL (L)). Recent Labs  Lab 10/16/23 1147 10/16/23 1637 10/17/23 0255 10/18/23 0501 10/19/23 0336 10/19/23 1105 10/20/23 0204  PROCALCITON 0.45  --  0.90 0.57  --   --   --   WBC 7.5  --  9.0 4.6 5.3 6.5 6.6  LATICACIDVEN 2.8* 1.2  --   --   --   --   --     Liver Function Tests: Recent Labs  Lab 10/16/23 1147 10/17/23 0453  10/17/23 1822 10/18/23 0501 10/19/23 0336 10/20/23 0204  AST 20  --   --   --   --   --   ALT 15  --   --   --   --   --   ALKPHOS 83  --   --   --   --   --   BILITOT 0.9  --   --   --   --   --   PROT 6.8  --   --   --   --   --   ALBUMIN 3.4* 2.7* 2.5* 2.4* 2.7* 2.6*   No results for input(s): "LIPASE", "AMYLASE" in the last 168 hours. No results for input(s): "AMMONIA" in the last 168 hours.  ABG    Component Value Date/Time   PHART 7.44 10/18/2023 0255   PCO2ART 63 (H) 10/18/2023 0255   PO2ART 105 10/18/2023 0255   HCO3 42.8 (H) 10/18/2023 0255   O2SAT 99 10/18/2023 0255     Coagulation Profile: Recent Labs  Lab 10/16/23 1939 10/19/23 1106  INR 1.1 1.3*    Cardiac Enzymes: No results for input(s): "CKTOTAL", "CKMB", "CKMBINDEX", "TROPONINI" in the last 168 hours.  HbA1C: Hemoglobin A1C  Date/Time Value Ref Range Status  07/13/2016 12:00 AM 7.9  Final   HB A1C (BAYER DCA - WAIVED)  Date/Time Value Ref Range Status  08/15/2022 04:25 PM 9.0 (H) 4.8 - 5.6 % Final    Comment:             Prediabetes: 5.7 - 6.4          Diabetes: >6.4          Glycemic control for adults with diabetes: <7.0   01/05/2021 11:12 AM 9.4 (H) <7.0 % Final    Comment:  Diabetic Adult            <7.0                                       Healthy Adult        4.3 - 5.7                                                           (DCCT/NGSP) American Diabetes Association's Summary of Glycemic Recommendations for Adults with Diabetes: Hemoglobin A1c <7.0%. More stringent glycemic goals (A1c <6.0%) may further reduce complications at the cost of increased risk of hypoglycemia.    Hgb A1c MFr Bld  Date/Time Value Ref Range Status  10/11/2023 11:49 AM 6.2 (H) 4.8 - 5.6 % Final    Comment:    (NOTE) Pre diabetes:          5.7%-6.4%  Diabetes:              >6.4%  Glycemic control for   <7.0% adults with diabetes   05/07/2023 01:50 PM 11.1 (H)  4.8 - 5.6 % Final    Comment:             Prediabetes: 5.7 - 6.4          Diabetes: >6.4          Glycemic control for adults with diabetes: <7.0     CBG: Recent Labs  Lab 10/19/23 1542 10/19/23 1936 10/19/23 2340 10/20/23 0346 10/20/23 0741  GLUCAP 328* 88 100* 102* 102*    Review of Systems:   Unable to obtain  Past Medical History:  She,  has a past medical history of Arthritis, COPD (chronic obstructive pulmonary disease) (HCC), Diabetes mellitus without complication (HCC), Dyspnea, Hyperlipidemia, Hypertension, Neuromuscular disorder (HCC), Shoulder pain, right, and Wears dentures.   Surgical History:   Past Surgical History:  Procedure Laterality Date   BREAST BIOPSY Right 04/12/2016   stereo bx/clip- neg   CARPAL TUNNEL RELEASE  06/17/2000   CATARACT EXTRACTION W/PHACO Left 09/20/2015   Procedure: CATARACT EXTRACTION PHACO AND INTRAOCULAR LENS PLACEMENT (IOC);  Surgeon: Sherald Hess, MD;  Location: [facility];  Service: Ophthalmology;  Laterality: Left;  DIABETIC - insulin   CATARACT EXTRACTION W/PHACO Right 06/03/2018   Procedure: CATARACT EXTRACTION PHACO AND INTRAOCULAR LENS PLACEMENT (IOC) RIGHT DIABETES;  Surgeon: Nevada Crane, MD;  Location: [facility];  Service: Ophthalmology;  Laterality: Right;  Diabetes-insulin dependent   CESAREAN SECTION     x2   CHOLECYSTECTOMY     EYE SURGERY     TOTAL ABDOMINAL HYSTERECTOMY W/ BILATERAL SALPINGOOPHORECTOMY       Social History:   reports that she quit smoking about 10 years ago. Her smoking use included cigarettes. She started smoking about 30 years ago. She has a 20 pack-year smoking history. She has never used smokeless tobacco. She reports that she does not drink alcohol and does not use drugs.   Family History:  Her family history includes Cancer in her brother and sister; Heart disease in her father. There is no history of Breast cancer.   Allergies Allergies  Allergen  Reactions   Empagliflozin    Actos [Pioglitazone] Other (See Comments)  CHF   Amlodipine Swelling    Leg swelling    Benazepril Other (See Comments)    angioedema   Hydrochlorothiazide Other (See Comments)    angioedema   Metformin And Related Diarrhea     Home Medications  Prior to Admission medications   Medication Sig Start Date End Date Taking? Authorizing Provider  acetaminophen (TYLENOL) 500 MG tablet Take 1,000 mg by mouth 3 (three) times daily.   Yes [provider]  albuterol (PROVENTIL) (2.5 MG/3ML) 0.083% nebulizer solution Take 2.5 mg by nebulization 4 (four) times daily.   Yes [provider]  amLODipine (NORVASC) 10 MG tablet Take 1 tablet (10 mg total) by mouth daily. 05/07/23  Yes Larae Grooms, NP  atenolol (TENORMIN) 100 MG tablet Take 1 tablet (100 mg total) by mouth daily. 05/07/23  Yes Larae Grooms, NP  atorvastatin (LIPITOR) 10 MG tablet Take 10 mg by mouth daily.   Yes [provider]  donepezil (ARICEPT) 5 MG tablet Take 5 mg by mouth at bedtime. 03/05/23 10/11/23 Yes [provider]  folic acid (FOLVITE) 400 MCG tablet Take 400 mcg by mouth daily.   Yes [provider]  gabapentin (NEURONTIN) 300 MG capsule Take 300 mg by mouth 3 (three) times daily.   Yes [provider]  insulin lispro (HUMALOG KWIKPEN) 100 UNIT/ML KwikPen Inject 0-6 Units into the skin 4 (four) times daily -  before meals and at bedtime.   Yes [provider]  ipratropium (ATROVENT) 0.02 % nebulizer solution Take 0.5 mg by nebulization 4 (four) times daily.   Yes [provider]  lidocaine 4 % Place 1 patch onto the skin daily.   Yes [provider]  methocarbamol (ROBAXIN) 750 MG tablet Take 750 mg by mouth 4 (four) times daily.   Yes [provider]  polyethylene glycol (MIRALAX / GLYCOLAX) 17 g packet Take 17 g by mouth 2 (two) times daily.   Yes [provider]  senna (SENOKOT) 8.6  MG TABS tablet Take 2 tablets by mouth 2 (two) times daily.   Yes [provider]  sertraline (ZOLOFT) 25 MG tablet Take 1 tablet (25 mg total) by mouth daily. You can increase to 50 mg in 2 weeks if you are still having mood symptoms. 06/29/23  Yes Mecum, Erin E, PA-C  tamsulosin (FLOMAX) 0.4 MG CAPS capsule Take 0.4 mg by mouth at bedtime.   Yes [provider]  BD PEN NEEDLE MICRO U/F 32G X 6 MM MISC USE 4 TO 5 TIMES DAILY WITH INSULIN USE 10/24/21   Laural Benes, Megan P, DO  Blood Glucose Monitoring Suppl (ONE TOUCH ULTRA 2) w/Device KIT 1 each by Does not apply route 2 (two) times daily. 05/15/23   Larae Grooms, NP  Blood Glucose Monitoring Suppl (ONETOUCH VERIO) w/Device KIT Use to check blood sugar 2-3 times daily and document, bring results to provider visits. 06/12/22   Larae Grooms, NP  Leda Min 5-2.5-18.5 LF-MCG/0.5 injection  05/13/23   [provider]  glucose blood (ONETOUCH ULTRA) test strip 1 each by Other route 2 (two) times daily. Use as instructed 05/15/23   Larae Grooms, NP  glucose blood test strip Use to check blood sugar 2-3 times daily and document, bring results to provider visits. 05/23/22   Larae Grooms, NP  hydrALAZINE (APRESOLINE) 10 MG tablet Take 1 tablet (10 mg total) by mouth 2 (two) times daily. Patient not taking: Reported on 10/11/2023 05/07/23   Larae Grooms, NP  insulin degludec (TRESIBA  FLEXTOUCH) 100 UNIT/ML FlexTouch Pen INJECT 40 UNITS SUBCUTANEOUSLY ONCE DAILY Patient not taking: Reported on 10/11/2023 05/02/23   Larae Grooms, NP  Lancets [facility] ULTRASOFT) lancets Use to check blood sugar 2-3 times daily and document, bring results to provider visits. 06/12/22   Larae Grooms, NP  Lancets [facility] ULTRASOFT) lancets 1 each by Other route 2 (two) times daily. Use as instructed 05/15/23   Larae Grooms, NP  lovastatin (MEVACOR) 40 MG tablet Take 1 tablet (40 mg total) by mouth daily with breakfast. Patient  not taking: Reported on 10/11/2023 05/07/23   Larae Grooms, NP  OneTouch Delica Lancets 33G MISC 1 applicator by Does not apply route in the morning and at bedtime. 05/23/22   Larae Grooms, NP  Semaglutide,0.25 or 0.5MG /DOS, (OZEMPIC, 0.25 OR 0.5 MG/DOSE,) 2 MG/3ML SOPN Inject 0.25 mg into the skin once a week. Patient not taking: Reported on 10/11/2023 05/28/23   Larae Grooms, NP  spironolactone (ALDACTONE) 25 MG tablet Take 1 tablet (25 mg total) by mouth daily. 05/07/23   Larae Grooms, NP  VITAMIN D, CHOLECALCIFEROL, PO Take 5,000 Units by mouth daily. Patient not taking: Reported on 10/11/2023    [provider]     Critical care time: 46 minutes    Raechel Chute, MD [facility] 10/20/2023 7:10 PM

## 2023-10-20 NOTE — Plan of Care (Signed)
  Problem: Education: Goal: Ability to describe self-care measures that may prevent or decrease complications (Diabetes Survival Skills Education) will improve Outcome: Progressing Goal: Individualized Educational Video(s) Outcome: Progressing   Problem: Coping: Goal: Ability to adjust to condition or change in health will improve Outcome: Progressing   Problem: Fluid Volume: Goal: Ability to maintain a balanced intake and output will improve Outcome: Progressing   Problem: Health Behavior/Discharge Planning: Goal: Ability to identify and utilize available resources and services will improve Outcome: Progressing Goal: Ability to manage health-related needs will improve Outcome: Progressing   Problem: Metabolic: Goal: Ability to maintain appropriate glucose levels will improve Outcome: Progressing   Problem: Nutritional: Goal: Maintenance of adequate nutrition will improve Outcome: Progressing Goal: Progress toward achieving an optimal weight will improve Outcome: Progressing   Problem: Skin Integrity: Goal: Risk for impaired skin integrity will decrease Outcome: Progressing   Problem: Tissue Perfusion: Goal: Adequacy of tissue perfusion will improve Outcome: Progressing   Problem: Education: Goal: Knowledge of General Education information will improve Description: Including pain rating scale, medication(s)/side effects and non-pharmacologic comfort measures Outcome: Progressing   Problem: Health Behavior/Discharge Planning: Goal: Ability to manage health-related needs will improve Outcome: Progressing   Problem: Clinical Measurements: Goal: Ability to maintain clinical measurements within normal limits will improve Outcome: Progressing Goal: Will remain free from infection Outcome: Progressing Goal: Diagnostic test results will improve Outcome: Progressing Goal: Respiratory complications will improve Outcome: Progressing Goal: Cardiovascular complication will  be avoided Outcome: Progressing   Problem: Activity: Goal: Risk for activity intolerance will decrease Outcome: Progressing   Problem: Nutrition: Goal: Adequate nutrition will be maintained Outcome: Progressing   Problem: Coping: Goal: Level of anxiety will decrease Outcome: Progressing   Problem: Elimination: Goal: Will not experience complications related to bowel motility Outcome: Progressing Goal: Will not experience complications related to urinary retention Outcome: Progressing   Problem: Pain Management: Goal: General experience of comfort will improve Outcome: Progressing   Problem: Safety: Goal: Ability to remain free from injury will improve Outcome: Progressing   Problem: Skin Integrity: Goal: Risk for impaired skin integrity will decrease Outcome: Progressing   Problem: Activity: Goal: Ability to tolerate increased activity will improve Outcome: Progressing   Problem: Respiratory: Goal: Ability to maintain adequate ventilation will improve Outcome: Progressing Goal: Ability to maintain a clear airway will improve Outcome: Progressing   Problem: Activity: Goal: Ability to tolerate increased activity will improve Outcome: Progressing   Problem: Respiratory: Goal: Ability to maintain a clear airway and adequate ventilation will improve Outcome: Progressing   Problem: Role Relationship: Goal: Method of communication will improve Outcome: Progressing   Problem: Activity: Goal: Ability to tolerate increased activity will improve Outcome: Progressing   Problem: Respiratory: Goal: Ability to maintain a clear airway and adequate ventilation will improve Outcome: Progressing   Problem: Role Relationship: Goal: Method of communication will improve Outcome: Progressing

## 2023-10-21 DIAGNOSIS — J9621 Acute and chronic respiratory failure with hypoxia: Secondary | ICD-10-CM | POA: Diagnosis not present

## 2023-10-21 DIAGNOSIS — J189 Pneumonia, unspecified organism: Secondary | ICD-10-CM | POA: Diagnosis not present

## 2023-10-21 DIAGNOSIS — I5033 Acute on chronic diastolic (congestive) heart failure: Secondary | ICD-10-CM | POA: Diagnosis not present

## 2023-10-21 DIAGNOSIS — J9601 Acute respiratory failure with hypoxia: Secondary | ICD-10-CM | POA: Diagnosis not present

## 2023-10-21 LAB — APTT
aPTT: 52 s — ABNORMAL HIGH (ref 24–36)
aPTT: 62 s — ABNORMAL HIGH (ref 24–36)

## 2023-10-21 LAB — CBC
HCT: 34.7 % — ABNORMAL LOW (ref 36.0–46.0)
Hemoglobin: 10.3 g/dL — ABNORMAL LOW (ref 12.0–15.0)
MCH: 25.8 pg — ABNORMAL LOW (ref 26.0–34.0)
MCHC: 29.7 g/dL — ABNORMAL LOW (ref 30.0–36.0)
MCV: 86.8 fL (ref 80.0–100.0)
Platelets: 61 10*3/uL — ABNORMAL LOW (ref 150–400)
RBC: 4 MIL/uL (ref 3.87–5.11)
RDW: 14.4 % (ref 11.5–15.5)
WBC: 7.1 10*3/uL (ref 4.0–10.5)
nRBC: 0 % (ref 0.0–0.2)

## 2023-10-21 LAB — GLUCOSE, CAPILLARY
Glucose-Capillary: 122 mg/dL — ABNORMAL HIGH (ref 70–99)
Glucose-Capillary: 124 mg/dL — ABNORMAL HIGH (ref 70–99)
Glucose-Capillary: 137 mg/dL — ABNORMAL HIGH (ref 70–99)
Glucose-Capillary: 171 mg/dL — ABNORMAL HIGH (ref 70–99)
Glucose-Capillary: 89 mg/dL (ref 70–99)
Glucose-Capillary: 94 mg/dL (ref 70–99)

## 2023-10-21 LAB — BASIC METABOLIC PANEL
Anion gap: 13 (ref 5–15)
BUN: 10 mg/dL (ref 8–23)
CO2: 33 mmol/L — ABNORMAL HIGH (ref 22–32)
Calcium: 8 mg/dL — ABNORMAL LOW (ref 8.9–10.3)
Chloride: 92 mmol/L — ABNORMAL LOW (ref 98–111)
Creatinine, Ser: 0.47 mg/dL (ref 0.44–1.00)
GFR, Estimated: >60 mL/min (ref >60)
Glucose, Bld: 123 mg/dL — ABNORMAL HIGH (ref 70–99)
Potassium: 3.5 mmol/L (ref 3.5–5.1)
Sodium: 138 mmol/L (ref 135–145)

## 2023-10-21 LAB — RENAL FUNCTION PANEL
Albumin: 2.6 g/dL — ABNORMAL LOW (ref 3.5–5.0)
Anion gap: 11 (ref 5–15)
BUN: 11 mg/dL (ref 8–23)
CO2: 34 mmol/L — ABNORMAL HIGH (ref 22–32)
Calcium: 8.3 mg/dL — ABNORMAL LOW (ref 8.9–10.3)
Chloride: 97 mmol/L — ABNORMAL LOW (ref 98–111)
Creatinine, Ser: 0.35 mg/dL — ABNORMAL LOW (ref 0.44–1.00)
GFR, Estimated: >60 mL/min (ref >60)
Glucose, Bld: 101 mg/dL — ABNORMAL HIGH (ref 70–99)
Phosphorus: 2.1 mg/dL — ABNORMAL LOW (ref 2.5–4.6)
Potassium: 3.3 mmol/L — ABNORMAL LOW (ref 3.5–5.1)
Sodium: 142 mmol/L (ref 135–145)

## 2023-10-21 LAB — BODY FLUID CULTURE W GRAM STAIN: Culture: NO GROWTH

## 2023-10-21 LAB — CULTURE, BLOOD (ROUTINE X 2)
Culture: NO GROWTH
Special Requests: ADEQUATE

## 2023-10-21 LAB — HEPARIN INDUCED PLATELET AB (HIT ANTIBODY): Heparin Induced Plt Ab: 0.086 {OD_unit} (ref 0.000–0.400)

## 2023-10-21 MED ORDER — HYDRALAZINE HCL 20 MG/ML IJ SOLN
10.0000 mg | INTRAMUSCULAR | Status: DC | PRN
  Administered 2023-10-21 – 2023-10-22 (×2): 10 mg via INTRAVENOUS
  Filled 2023-10-21 (×2): qty 1

## 2023-10-21 MED ORDER — MELATONIN 5 MG PO TABS
2.5000 mg | ORAL_TABLET | Freq: Every day | ORAL | Status: DC
  Administered 2023-10-21 – 2023-10-25 (×5): 2.5 mg via ORAL
  Filled 2023-10-21 (×4): qty 1

## 2023-10-21 MED ORDER — APIXABAN 5 MG PO TABS
5.0000 mg | ORAL_TABLET | Freq: Two times a day (BID) | ORAL | Status: DC
  Filled 2023-10-21: qty 1

## 2023-10-21 MED ORDER — POTASSIUM PHOSPHATES 15 MMOLE/5ML IV SOLN
30.0000 mmol | Freq: Once | INTRAVENOUS | Status: AC
  Administered 2023-10-21: 30 mmol via INTRAVENOUS
  Filled 2023-10-21: qty 10

## 2023-10-21 MED ORDER — POTASSIUM CHLORIDE 10 MEQ/50ML IV SOLN
10.0000 meq | Freq: Once | INTRAVENOUS | Status: AC
  Administered 2023-10-21: 10 meq via INTRAVENOUS
  Filled 2023-10-21: qty 50

## 2023-10-21 MED ORDER — POTASSIUM CHLORIDE 10 MEQ/50ML IV SOLN
10.0000 meq | INTRAVENOUS | Status: AC
  Administered 2023-10-21 (×4): 10 meq via INTRAVENOUS
  Filled 2023-10-21 (×4): qty 50

## 2023-10-21 MED ORDER — FUROSEMIDE 10 MG/ML IJ SOLN
80.0000 mg | Freq: Once | INTRAMUSCULAR | Status: AC
  Administered 2023-10-21: 80 mg via INTRAVENOUS
  Filled 2023-10-21: qty 8

## 2023-10-21 NOTE — Progress Notes (Signed)
Chest tube removed by Dr. Aundria Rud, all parts intact and patient tolerated well.

## 2023-10-21 NOTE — Progress Notes (Signed)
Patient's B/P was 174/69 and re-verified to be elevated with recheck at 170/72; Dr. Aundria Rud notified and placed orders for prn hydralazine and a dose of lasix.

## 2023-10-21 NOTE — Progress Notes (Signed)
NAME:  Tami Cummings, MRN:  [id number], DOB:  28-Mar-1946, LOS: 10 ADMISSION DATE:  10/11/2023, CHIEF COMPLAINT:  Respiratory Failure   History of Present Illness:  Tami Cummings is a 77 y.o. female with medical history significant of DM2, dementia who presented from [facility] for concern for possible PNA.    Patient was admitted for dyspnea and hypoxia. The patient was recently hospitalized (08/31/23 - 09/10/23) for wound dehiscence after back surgery of T9-T12 fusion (done 07/30/23) following an injury that occurred in July of this year. After a washout she was discharged to a SNF with a PICC line for Vancomycin and Rocephin scheduled to d/c abx 10/11/23.   The patient has dementia as baseline and was given versed PRN d/t to agitation. A rapid response was called on the floor d/t suspected aspiration. The patient was saturating in the 60-70% with high flow [place] and was subsequently intubated.   Please see "Significant Hospital Events" section below for full detailed hospital course.  Pertinent  Medical History  COPD HTN  Type II Diabetes Mellitus  Arthritis   Significant Hospital Events: Including procedures, antibiotic start and stop dates in addition to other pertinent events   10/11/23: admitted to hospital service and treated for acute hypoxic respiratory failure 10/25: Agitated, trying to get OOB 10/26: Confused, agitated, trying to get OOB.  Required Haldol and Zyprexa. 10/27: Confused, refusing medications 10/28: excessive secretions in upper airway requiring suctioning 10/29: Transferred to the ICU due to increased work of breathing and O2 desaturation requiring mechanical ventilation and vasopressor support 10/30: Pt remains mechanically intubated vent settings improving: FiO2 45%/PEEP 10.  Requiring levophed gtt @5  mcg/min and vasopressin @0 .04 units/min.  Right-sided chest tube placed due to large pleural effusion  10/31: Pt no longer requiring vasopressors  and maintain map 65 or higher.  During WUA pts following commands.  Performed SBT, however became tachypneic with accessory muscle use and hypoxic placed back on full support.  Will diurese with 60 mg iv lasix x1 dose.  Will reattempt SBT following diuresis  11/1: On minimal vent support, Diurese again with 60 mg IV Lasix x1 dose then perform SBT as tolerated.  With worsening thrombocytopenia, start Argatroban while ruling out HIT and DIC.  In the afternoon family requests 1 way extubation with transition to Comfort Measures should she not tolerate extubation 11/2: comfortable on hi-flo nasal cannula 11/3: oxygen weaned to nasal cannula  Interim History / Subjective:  Patient is confused. Denies pain.  Objective   Blood pressure (!) 170/72, pulse 90, temperature 99.4 F (37.4 C), temperature source Oral, resp. rate (!) 29, height 5\' 1"  (1.549 m), weight 67.8 kg, SpO2 94%.    FiO2 (%):  [28 %-75 %] 28 %   Intake/Output Summary (Last 24 hours) at 10/21/2023 0900 Last data filed at 10/21/2023 0800 Gross per 24 hour  Intake 1101.53 ml  Output 2981 ml  Net -1879.47 ml   Filed Weights   10/18/23 0500 10/19/23 0435 10/21/23 0400  Weight: 78 kg 78 kg 67.8 kg    Examination: Physical Exam Constitutional:      General: She is not in acute distress.    Appearance: She is ill-appearing.  Cardiovascular:     Rate and Rhythm: Normal rate and regular rhythm.     Heart sounds: Normal heart sounds.  Pulmonary:     Breath sounds: Rales present. No wheezing or rhonchi.  Abdominal:     Palpations: Abdomen is soft.  Neurological:  Mental Status: She is disoriented.      Assessment & Plan:   77 year old female with history of dementia, diabetes, and recent T9-T-12 fusion complicated by wound dehiscence and infection s/p revision in September who presents to the hospital with increased shortness of breath concerning for pneumonia. Her condition worsened and she required intubation and  mechanical ventilation.   Neurology #Toxic Metabolic Encephalopathy #Dementia  Patient with baseline dementia with waxing and waning mental status. She was intubated for respiratory failure, and subsequently sedated. Extubated with improvement in mental status, family report she is back to baseline. Continue with behavioral interventions and avoid psychotropic medications.  Cardiovascular #Acute Decompensated Heart Failure with Preserved Ejection Fraction  She has been off vasopressors and LA normalized. TTE with elevated PA pressures consistent with decompensated heart failure. She is continuing to diurese.  -goal MAP > 65 mmHg -will need continued diuresis  -80 mg IV furosemide today  Pulmonary #Acute on Chronic Hypoxic and Hypercapnic Respiratory Failure  #bilateral effusions, pneumonia #Pulmonary edema #HAP  Presented with respiratory failure requiring intubation and mechanical ventilation. She has improved following antibiotics and diuresis, with chest tube placed to drain right sided effusion. Extubated to Hi-Flo, and has done well, now on nasal cannula. Will continue to trend her oxygen supplementation down, and work on mobilizing her and providing pulmonary toilet.  -d/c chest tube today   Gastrointestinal - SLP evaluated patient, will follow. SUP d/c   Renal #HypoKalemia  This is in the setting of aggressive diuresis. Repletion as necessary, continue to avoid nephrotoxins and monitor kidney function and I/O's  -continue with IV diuresis   Endocrine - ICU glycemic protocol. Initiated hydrocortisone for adjunctive therapy of severe pneumonia per CAPE-COD trial. Hydrocortisone now discontinued.   Hem/Onc - Heparin gtt for DVT noted on duplex of the lower extremities with subsequent drop in platelet count. Switched to argatroban, check for HITT (awaiting screen and SRA). Patient refused apixaban today so argatroban continued.   ID - broad spectrum antibiotics for HAP  coverage. Re-checked MRSA. Switched to Ceftriaxone.  Best Practice (right click and "Reselect all SmartList Selections" daily)   Diet/type: dysphagia diet (see orders) DVT prophylaxis: other argatroban GI prophylaxis: N/A Lines: Central line Foley:  Yes, and it is still needed Code Status:  full code Last date of multidisciplinary goals of care discussion [10/21/2023]  Labs   CBC: Recent Labs  Lab 10/18/23 0501 10/19/23 0336 10/19/23 1105 10/20/23 0204 10/21/23 0536  WBC 4.6 5.3 6.5 6.6 7.1  NEUTROABS  --   --  5.8  --   --   HGB 9.9* 11.6* 11.4* 10.7* 10.3*  HCT 33.3* 39.1 37.0 34.8* 34.7*  MCV 85.8 85.6 83.3 85.5 86.8  PLT 106* 73* 70* 64* 61*    Basic Metabolic Panel: Recent Labs  Lab 10/17/23 0453 10/17/23 1207 10/18/23 0501 10/18/23 1400 10/19/23 0336 10/19/23 1401 10/19/23 2014 10/20/23 0204 10/20/23 1344 10/21/23 0536  NA 143  --  141  --  145  --   --  144 143 142  K 3.7  --  2.8*   < > 3.5 2.9*  --  2.6* 3.1* 3.3*  CL 95*  --  95*  --  97*  --   --  94* 94* 97*  CO2 35*  --  36*  --  38*  --   --  38* 37* 34*  GLUCOSE 157*  --  171*  --  206*  --   --  110* 165*  101*  BUN 31*  --  40*  --  24*  --   --  16 14 11   CREATININE 0.79   < > 0.71  --  0.45  --   --  0.33* 0.42* 0.35*  CALCIUM 8.4*  --  8.0*  --  8.3*  --   --  8.2* 8.1* 8.3*  MG 2.1  --  2.1  --  1.9 1.7  --  2.1  --   --   PHOS 4.9*  --  3.9  --  1.6*  --  3.0 2.6  --  2.1*   < > = values in this interval not displayed.   GFR: Estimated Creatinine Clearance: 52.7 mL/min (A) (by C-G formula based on SCr of 0.35 mg/dL (L)). Recent Labs  Lab 10/16/23 1147 10/16/23 1637 10/17/23 0255 10/18/23 0501 10/19/23 0336 10/19/23 1105 10/20/23 0204 10/21/23 0536  PROCALCITON 0.45  --  0.90 0.57  --   --   --   --   WBC 7.5  --  9.0 4.6 5.3 6.5 6.6 7.1  LATICACIDVEN 2.8* 1.2  --   --   --   --   --   --     Liver Function Tests: Recent Labs  Lab 10/16/23 1147 10/17/23 0453 10/17/23 1822  10/18/23 0501 10/19/23 0336 10/20/23 0204 10/21/23 0536  AST 20  --   --   --   --   --   --   ALT 15  --   --   --   --   --   --   ALKPHOS 83  --   --   --   --   --   --   BILITOT 0.9  --   --   --   --   --   --   PROT 6.8  --   --   --   --   --   --   ALBUMIN 3.4*   < > 2.5* 2.4* 2.7* 2.6* 2.6*   < > = values in this interval not displayed.   No results for input(s): "LIPASE", "AMYLASE" in the last 168 hours. No results for input(s): "AMMONIA" in the last 168 hours.  ABG    Component Value Date/Time   PHART 7.44 10/18/2023 0255   PCO2ART 63 (H) 10/18/2023 0255   PO2ART 105 10/18/2023 0255   HCO3 42.8 (H) 10/18/2023 0255   O2SAT 99 10/18/2023 0255     Coagulation Profile: Recent Labs  Lab 10/16/23 1939 10/19/23 1106  INR 1.1 1.3*    Cardiac Enzymes: No results for input(s): "CKTOTAL", "CKMB", "CKMBINDEX", "TROPONINI" in the last 168 hours.  HbA1C: Hemoglobin A1C  Date/Time Value Ref Range Status  07/13/2016 12:00 AM 7.9  Final   HB A1C (BAYER DCA - WAIVED)  Date/Time Value Ref Range Status  08/15/2022 04:25 PM 9.0 (H) 4.8 - 5.6 % Final    Comment:             Prediabetes: 5.7 - 6.4          Diabetes: >6.4          Glycemic control for adults with diabetes: <7.0   01/05/2021 11:12 AM 9.4 (H) <7.0 % Final    Comment:  Diabetic Adult            <7.0                                       Healthy Adult        4.3 - 5.7                                                           (DCCT/NGSP) American Diabetes Association's Summary of Glycemic Recommendations for Adults with Diabetes: Hemoglobin A1c <7.0%. More stringent glycemic goals (A1c <6.0%) may further reduce complications at the cost of increased risk of hypoglycemia.    Hgb A1c MFr Bld  Date/Time Value Ref Range Status  10/11/2023 11:49 AM 6.2 (H) 4.8 - 5.6 % Final    Comment:    (NOTE) Pre diabetes:          5.7%-6.4%  Diabetes:               >6.4%  Glycemic control for   <7.0% adults with diabetes   05/07/2023 01:50 PM 11.1 (H) 4.8 - 5.6 % Final    Comment:             Prediabetes: 5.7 - 6.4          Diabetes: >6.4          Glycemic control for adults with diabetes: <7.0     CBG: Recent Labs  Lab 10/20/23 1548 10/20/23 1931 10/20/23 2357 10/21/23 0400 10/21/23 0729  GLUCAP 118* 116* 141* 89 94    Review of Systems:   Unable to obtain  Past Medical History:  She,  has a past medical history of Arthritis, COPD (chronic obstructive pulmonary disease) (HCC), Diabetes mellitus without complication (HCC), Dyspnea, Hyperlipidemia, Hypertension, Neuromuscular disorder (HCC), Shoulder pain, right, and Wears dentures.   Surgical History:   Past Surgical History:  Procedure Laterality Date   BREAST BIOPSY Right 04/12/2016   stereo bx/clip- neg   CARPAL TUNNEL RELEASE  06/17/2000   CATARACT EXTRACTION W/PHACO Left 09/20/2015   Procedure: CATARACT EXTRACTION PHACO AND INTRAOCULAR LENS PLACEMENT (IOC);  Surgeon: Sherald Hess, MD;  Location: [facility];  Service: Ophthalmology;  Laterality: Left;  DIABETIC - insulin   CATARACT EXTRACTION W/PHACO Right 06/03/2018   Procedure: CATARACT EXTRACTION PHACO AND INTRAOCULAR LENS PLACEMENT (IOC) RIGHT DIABETES;  Surgeon: Nevada Crane, MD;  Location: [facility];  Service: Ophthalmology;  Laterality: Right;  Diabetes-insulin dependent   CESAREAN SECTION     x2   CHOLECYSTECTOMY     EYE SURGERY     TOTAL ABDOMINAL HYSTERECTOMY W/ BILATERAL SALPINGOOPHORECTOMY       Social History:   reports that she quit smoking about 10 years ago. Her smoking use included cigarettes. She started smoking about 30 years ago. She has a 20 pack-year smoking history. She has never used smokeless tobacco. She reports that she does not drink alcohol and does not use drugs.   Family History:  Her family history includes Cancer in her brother and sister; Heart disease  in her father. There is no history of Breast cancer.   Allergies Allergies  Allergen Reactions   Empagliflozin    Actos [Pioglitazone] Other (See Comments)  CHF   Amlodipine Swelling    Leg swelling    Benazepril Other (See Comments)    angioedema   Hydrochlorothiazide Other (See Comments)    angioedema   Metformin And Related Diarrhea     Home Medications  Prior to Admission medications   Medication Sig Start Date End Date Taking? Authorizing Provider  acetaminophen (TYLENOL) 500 MG tablet Take 1,000 mg by mouth 3 (three) times daily.   Yes [provider]  albuterol (PROVENTIL) (2.5 MG/3ML) 0.083% nebulizer solution Take 2.5 mg by nebulization 4 (four) times daily.   Yes [provider]  amLODipine (NORVASC) 10 MG tablet Take 1 tablet (10 mg total) by mouth daily. 05/07/23  Yes Larae Grooms, NP  atenolol (TENORMIN) 100 MG tablet Take 1 tablet (100 mg total) by mouth daily. 05/07/23  Yes Larae Grooms, NP  atorvastatin (LIPITOR) 10 MG tablet Take 10 mg by mouth daily.   Yes [provider]  donepezil (ARICEPT) 5 MG tablet Take 5 mg by mouth at bedtime. 03/05/23 10/11/23 Yes [provider]  folic acid (FOLVITE) 400 MCG tablet Take 400 mcg by mouth daily.   Yes [provider]  gabapentin (NEURONTIN) 300 MG capsule Take 300 mg by mouth 3 (three) times daily.   Yes [provider]  insulin lispro (HUMALOG KWIKPEN) 100 UNIT/ML KwikPen Inject 0-6 Units into the skin 4 (four) times daily -  before meals and at bedtime.   Yes [provider]  ipratropium (ATROVENT) 0.02 % nebulizer solution Take 0.5 mg by nebulization 4 (four) times daily.   Yes [provider]  lidocaine 4 % Place 1 patch onto the skin daily.   Yes [provider]  methocarbamol (ROBAXIN) 750 MG tablet Take 750 mg by mouth 4 (four) times daily.   Yes [provider]  polyethylene glycol (MIRALAX / GLYCOLAX) 17 g packet Take  17 g by mouth 2 (two) times daily.   Yes [provider]  senna (SENOKOT) 8.6 MG TABS tablet Take 2 tablets by mouth 2 (two) times daily.   Yes [provider]  sertraline (ZOLOFT) 25 MG tablet Take 1 tablet (25 mg total) by mouth daily. You can increase to 50 mg in 2 weeks if you are still having mood symptoms. 06/29/23  Yes Mecum, Erin E, PA-C  tamsulosin (FLOMAX) 0.4 MG CAPS capsule Take 0.4 mg by mouth at bedtime.   Yes [provider]  BD PEN NEEDLE MICRO U/F 32G X 6 MM MISC USE 4 TO 5 TIMES DAILY WITH INSULIN USE 10/24/21   Laural Benes, Megan P, DO  Blood Glucose Monitoring Suppl (ONE TOUCH ULTRA 2) w/Device KIT 1 each by Does not apply route 2 (two) times daily. 05/15/23   Larae Grooms, NP  Blood Glucose Monitoring Suppl (ONETOUCH VERIO) w/Device KIT Use to check blood sugar 2-3 times daily and document, bring results to provider visits. 06/12/22   Larae Grooms, NP  Leda Min 5-2.5-18.5 LF-MCG/0.5 injection  05/13/23   [provider]  glucose blood (ONETOUCH ULTRA) test strip 1 each by Other route 2 (two) times daily. Use as instructed 05/15/23   Larae Grooms, NP  glucose blood test strip Use to check blood sugar 2-3 times daily and document, bring results to provider visits. 05/23/22   Larae Grooms, NP  hydrALAZINE (APRESOLINE) 10 MG tablet Take 1 tablet (10 mg total) by mouth 2 (two) times daily. Patient not taking: Reported on 10/11/2023 05/07/23   Larae Grooms, NP  insulin degludec (TRESIBA  FLEXTOUCH) 100 UNIT/ML FlexTouch Pen INJECT 40 UNITS SUBCUTANEOUSLY ONCE DAILY Patient not taking: Reported on 10/11/2023 05/02/23   Larae Grooms, NP  Lancets [facility] ULTRASOFT) lancets Use to check blood sugar 2-3 times daily and document, bring results to provider visits. 06/12/22   Larae Grooms, NP  Lancets [facility] ULTRASOFT) lancets 1 each by Other route 2 (two) times daily. Use as instructed 05/15/23   Larae Grooms, NP  lovastatin  (MEVACOR) 40 MG tablet Take 1 tablet (40 mg total) by mouth daily with breakfast. Patient not taking: Reported on 10/11/2023 05/07/23   Larae Grooms, NP  OneTouch Delica Lancets 33G MISC 1 applicator by Does not apply route in the morning and at bedtime. 05/23/22   Larae Grooms, NP  Semaglutide,0.25 or 0.5MG /DOS, (OZEMPIC, 0.25 OR 0.5 MG/DOSE,) 2 MG/3ML SOPN Inject 0.25 mg into the skin once a week. Patient not taking: Reported on 10/11/2023 05/28/23   Larae Grooms, NP  spironolactone (ALDACTONE) 25 MG tablet Take 1 tablet (25 mg total) by mouth daily. 05/07/23   Larae Grooms, NP  VITAMIN D, CHOLECALCIFEROL, PO Take 5,000 Units by mouth daily. Patient not taking: Reported on 10/11/2023    [provider]     Critical care time: 39 minutes    Raechel Chute, MD [place] [facility] 10/21/2023 6:29 PM

## 2023-10-21 NOTE — Plan of Care (Signed)
Continuing with plan of care. 

## 2023-10-21 NOTE — Consult Note (Signed)
PHARMACY - ANTICOAGULATION CONSULT NOTE  Pharmacy Consult for Argatroban  Indication: DVT  Allergies  Allergen Reactions   Empagliflozin    Actos [Pioglitazone] Other (See Comments)    CHF   Amlodipine Swelling    Leg swelling    Benazepril Other (See Comments)    angioedema   Hydrochlorothiazide Other (See Comments)    angioedema   Metformin And Related Diarrhea   Patient Measurements: Height: 5\' 1"  (154.9 cm) Weight: 67.8 kg (149 lb 7.6 oz) IBW/kg (Calculated) : 47.8  Vital Signs: Temp: 98 F (36.7 C) (11/03 1612) Temp Source: Oral (11/03 1612) BP: 152/64 (11/03 1612) Pulse Rate: 97 (11/03 1612)  Labs: Recent Labs    10/19/23 0336 10/19/23 1105 10/19/23 1106 10/19/23 1401 10/20/23 0204 10/20/23 0920 10/20/23 1344 10/21/23 0536 10/21/23 1636  HGB 11.6* 11.4*  --   --  10.7*  --   --  10.3*  --   HCT 39.1 37.0  --   --  34.8*  --   --  34.7*  --   PLT 73* 70*  --   --  64*  --   --  61*  --   APTT  --   --  38*   < > 37*   < > 51* 52* 62*  LABPROT  --   --  16.4*  --   --   --   --   --   --   INR  --   --  1.3*  --   --   --   --   --   --   HEPARINUNFRC 0.30  --  <0.10*  --   --   --   --   --   --   CREATININE 0.45  --   --   --  0.33*  --  0.42* 0.35*  --    < > = values in this interval not displayed.   Estimated Creatinine Clearance: 52.7 mL/min (A) (by C-G formula based on SCr of 0.35 mg/dL (L)).  Medical History: Past Medical History:  Diagnosis Date   Arthritis    knee   COPD (chronic obstructive pulmonary disease) (HCC)    Diabetes mellitus without complication (HCC)    type 2   Dyspnea    Hyperlipidemia    Hypertension    controlled   Neuromuscular disorder (HCC)    numbness in hands and feet   Shoulder pain, right    Wears dentures    upper and lower   Medications:  Patient was on enoxaparin 40 mg SubQ q24h, last dose 10/28 @ 2128   Assessment: 77 yo female presented to [facility] due to acute respiratory failure. PMG includes T2DM,  HFpEF, and dementia. Patient was started on heparin infusion on 10/29. Platelets have down trended since infusion was started and at 73 today. Pharmacy has been consulted for management of argatroban infusion.   Goal of Therapy:  aPTT 50-90 seconds  Monitor platelets by anticoagulation protocol: Yes  Labs: 11/01 1401 aPTT = 39; subtherapeutic 11/01 2014 aPTT = 48, subtherapeutic 11/02 0204 aPTT = 37, subtherapeutic 11/02 0920 aPTT = 50, therapeutic x1 11/02 1344 aPTT = 51, therapeutic x 2 11/03 0536 aPTT = 52, therapeutic x 3 11/03 1636 aPTT = 62, therapeutic x 4  Plan:  Continue argatroban infusion at 78.78 mcg/min  Check aPTT BID(0500 and 1700) CBC daily while on argatroban  F/u HITT testing   Barrie Folk, PharmD 10/21/2023 5:09 PM

## 2023-10-21 NOTE — Progress Notes (Signed)
Chest tube has drained 70mL since placed on low suction, Dr. Aundria Rud notified and patient is continuing with chest tube at this time to low suction.

## 2023-10-21 NOTE — Progress Notes (Signed)
Late entry: Pt bath completed about 2100. When rolling patient, this nurse noted that chest tube dressing was loose and a kink was noted in tubing. Reinforced dressing and ensured that tubing was straight. Within about 40 minutes, serous colored drainage noted in collection device. Provider notified. This nurse also applied small mepilex to skin under stopcock to add additional padding and reduce risk of pressure sore.

## 2023-10-21 NOTE — Progress Notes (Signed)
Speech Language Pathology Treatment: Dysphagia  Patient Details Name: Tami Cummings MRN: [id number] DOB: 03-30-1946 Today's Date: 10/21/2023 Time: 3329-5188 SLP Time Calculation (min) (ACUTE ONLY): 8 min  Assessment / Plan / Recommendation Clinical Impression  Limited diet tolerance completed via consultation with RN and pt's daughter at bedside. Per daughter and RN, pt with limited PO intake - consuming a "few" bites/sips since initiating a Dysphagia 2 Diet with Thin Liquids. No reported s/sx pharyngeal dysphagia. POs deferred as pt recently given something to "help her sleep." Recommend continuation of current diet with thin liquids. Safe swallowing strategies and aspiration precautions as outlined below. Pt made daughter aware of results, recommendations, and SLP POC. Daughter verbalized understanding/agreement. Pt may also benefit from RD consult given concern for reduced PO intake. SLP to continue to f/u.    HPI HPI: Tami Cummings is a 77 y.o. female with medical history significant of DM2, dementia who presented from [facility] for concern for possible PNA on 10/11/2023. Patient was admitted for dyspnea and hypoxia. The patient was recently hospitalized (08/31/23 - 09/10/23) for wound dehiscence after back surgery of T9-T12 fusion (done 07/30/23) following an injury that occurred in July of this year. After a washout she was discharged to a SNF with a PICC line for Vancomycin and Rocephin scheduled to d/c abx 10/11/23. The patient has dementia as baseline and was given versed PRN d/t to agitation. A rapid response was called on the floor (10/16/2023) d/t suspected aspiration. The patient was saturating in the 60-70% with high flow [place] and was subsequently intubated. Pt admitted to critical care with extubation on 10/19/2023.      SLP Plan  Continue with current plan of care      Recommendations for follow up therapy are one component of a multi-disciplinary discharge  planning process, led by the attending physician.  Recommendations may be updated based on patient status, additional functional criteria and insurance authorization.    Recommendations  Diet recommendations: Dysphagia 2 (fine chop);Thin liquid Medication Administration: Whole meds with puree Supervision: Staff to assist with self feeding;Full supervision/cueing for compensatory strategies Compensations: Minimize environmental distractions;Slow rate;Small sips/bites Postural Changes and/or Swallow Maneuvers: Seated upright 90 degrees;Upright 30-60 min after meal                  Oral care BID   Frequent or constant Supervision/Assistance Dysphagia, oral phase (R13.11)     Continue with current plan of care    Clyde Canterbury, M.S., CCC-SLP Speech-Language Pathologist Secure Chat Preferred  O: [phone]  Woodroe Chen  10/21/2023, 9:29 AM

## 2023-10-21 NOTE — Progress Notes (Signed)
Family voiced concerns regarding patient not sleeping well at night, Dr. Aundria Rud notified and verbal order received for 3mg  melatonin po at HS.  Family also voiced concerns regarding patient's appetite and poor intake, speech therapy referred to dietitian earlier in the shift.

## 2023-10-21 NOTE — Procedures (Signed)
Chest Tube Removal:  Pigtail catheter sutures were cut and chest tube site cleaned. Chest tube was removed while the patient was instructed to hum. Tube removed in one piece. Wound site was immediately occluded with Xeroform and sterile gauze, pressure held and then taped down with tegaderm. Patient tolerated removal well with no immediate complications.  Raechel Chute, MD [place] [facility] 10/21/2023 7:13 PM

## 2023-10-21 NOTE — Consult Note (Signed)
PHARMACY CONSULT NOTE - ELECTROLYTES  Pharmacy Consult for Electrolyte Monitoring and Replacement   Recent Labs: Height: 5\' 1"  (154.9 cm) Weight: 67.8 kg (149 lb 7.6 oz) IBW/kg (Calculated) : 47.8 Estimated Creatinine Clearance: 52.7 mL/min (A) (by C-G formula based on SCr of 0.35 mg/dL (L)). Potassium (mmol/L)  Date Value  10/21/2023 3.3 (L)   Magnesium (mg/dL)  Date Value  16/09/9603 2.1   Calcium (mg/dL)  Date Value  54/08/8118 8.3 (L)   Albumin (g/dL)  Date Value  14/78/2956 2.6 (L)  05/07/2023 3.9   Phosphorus (mg/dL)  Date Value  21/30/8657 2.1 (L)   Sodium (mmol/L)  Date Value  10/21/2023 142  05/07/2023 138   Corrected Ca: 9.42 mg/dL  Assessment  Tami Cummings is a 77 y.o. female presenting with dyspnea. PMH significant for type 2 diabetes and dementia. Pharmacy has been consulted to monitor and replace electrolytes.  Diet: DYS 2  Medications: furosemide 80mg  IV x 1  Goal of Therapy: Electrolytes WNL  Plan:  K 3.3, Phos 2.1: Kphos IV x 1, Kcl IV x 4 Check BMP, Mg, Phos with AM labs  Thank you for allowing pharmacy to be a part of this patient's care.  Bettey Costa, PharmD Clinical Pharmacist 10/21/2023 8:14 AM

## 2023-10-21 NOTE — Progress Notes (Signed)
Per Dr. Aundria Rud switch patient from water seal to low suction for two hours prior to chest tube removal.

## 2023-10-21 NOTE — Consult Note (Signed)
PHARMACY - ANTICOAGULATION CONSULT NOTE  Pharmacy Consult for Argatroban  Indication: DVT  Allergies  Allergen Reactions   Empagliflozin    Actos [Pioglitazone] Other (See Comments)    CHF   Amlodipine Swelling    Leg swelling    Benazepril Other (See Comments)    angioedema   Hydrochlorothiazide Other (See Comments)    angioedema   Metformin And Related Diarrhea   Patient Measurements: Height: 5\' 1"  (154.9 cm) Weight: 67.8 kg (149 lb 7.6 oz) IBW/kg (Calculated) : 47.8  Vital Signs: Temp: 99.1 F (37.3 C) (11/03 0400) Temp Source: Oral (11/03 0400) BP: 158/67 (11/03 0520) Pulse Rate: 128 (11/03 0520)  Labs: Recent Labs    10/18/23 1614 10/19/23 0336 10/19/23 0336 10/19/23 1105 10/19/23 1106 10/19/23 1401 10/20/23 0204 10/20/23 0920 10/20/23 1344 10/21/23 0536  HGB  --  11.6*   < > 11.4*  --   --  10.7*  --   --  10.3*  HCT  --  39.1   < > 37.0  --   --  34.8*  --   --  34.7*  PLT  --  73*   < > 70*  --   --  64*  --   --  61*  APTT  --   --   --   --  38*   < > 37* 50* 51* 52*  LABPROT  --   --   --   --  16.4*  --   --   --   --   --   INR  --   --   --   --  1.3*  --   --   --   --   --   HEPARINUNFRC 0.24* 0.30  --   --  <0.10*  --   --   --   --   --   CREATININE  --  0.45  --   --   --   --  0.33*  --  0.42*  --    < > = values in this interval not displayed.   Estimated Creatinine Clearance: 52.7 mL/min (A) (by C-G formula based on SCr of 0.42 mg/dL (L)).  Medical History: Past Medical History:  Diagnosis Date   Arthritis    knee   COPD (chronic obstructive pulmonary disease) (HCC)    Diabetes mellitus without complication (HCC)    type 2   Dyspnea    Hyperlipidemia    Hypertension    controlled   Neuromuscular disorder (HCC)    numbness in hands and feet   Shoulder pain, right    Wears dentures    upper and lower   Medications:  Patient was on enoxaparin 40 mg SubQ q24h, last dose 10/28 @ 2128   Assessment: 77 yo female presented to  [facility] due to acute respiratory failure. PMG includes T2DM, HFpEF, and dementia. Patient was started on heparin infusion on 10/29. Platelets have down trended since infusion was started and at 73 today. Pharmacy has been consulted for management of argatroban infusion.   Goal of Therapy:  aPTT 50-90 seconds  Monitor platelets by anticoagulation protocol: Yes  Labs: 11/01 1401 aPTT = 39; subtherapeutic 11/01 2014 aPTT = 48, subtherapeutic 11/02 0204 aPTT = 37, subtherapeutic 11/02 0920 aPTT = 50, therapeutic x1 11/02 1344 aPTT = 51, therapeutic x 2 11/03 0536 aPTT = 52, therapeutic x 3  Plan:  Continue argatroban infusion at 78.78 mcg/min  Check aPTT BID(0500 and  1700) CBC daily while on argatroban  F/u HITT testing   Otelia Sergeant, PharmD, [facility] 10/21/2023 6:02 AM

## 2023-10-22 ENCOUNTER — Inpatient Hospital Stay: Payer: 59

## 2023-10-22 DIAGNOSIS — I82469 Acute embolism and thrombosis of unspecified calf muscular vein: Secondary | ICD-10-CM | POA: Diagnosis not present

## 2023-10-22 DIAGNOSIS — J90 Pleural effusion, not elsewhere classified: Secondary | ICD-10-CM | POA: Diagnosis not present

## 2023-10-22 DIAGNOSIS — Y95 Nosocomial condition: Secondary | ICD-10-CM

## 2023-10-22 DIAGNOSIS — J9621 Acute and chronic respiratory failure with hypoxia: Secondary | ICD-10-CM | POA: Diagnosis not present

## 2023-10-22 DIAGNOSIS — R0902 Hypoxemia: Secondary | ICD-10-CM | POA: Diagnosis not present

## 2023-10-22 DIAGNOSIS — Z7189 Other specified counseling: Secondary | ICD-10-CM | POA: Diagnosis not present

## 2023-10-22 DIAGNOSIS — J189 Pneumonia, unspecified organism: Secondary | ICD-10-CM | POA: Diagnosis not present

## 2023-10-22 LAB — CULTURE, BLOOD (ROUTINE X 2)
Culture: NO GROWTH
Special Requests: ADEQUATE

## 2023-10-22 LAB — CBC
HCT: 35.6 % — ABNORMAL LOW (ref 36.0–46.0)
Hemoglobin: 10.8 g/dL — ABNORMAL LOW (ref 12.0–15.0)
MCH: 25 pg — ABNORMAL LOW (ref 26.0–34.0)
MCHC: 30.3 g/dL (ref 30.0–36.0)
MCV: 82.4 fL (ref 80.0–100.0)
Platelets: 85 10*3/uL — ABNORMAL LOW (ref 150–400)
RBC: 4.32 MIL/uL (ref 3.87–5.11)
RDW: 14.5 % (ref 11.5–15.5)
WBC: 6.6 10*3/uL (ref 4.0–10.5)
nRBC: 0 % (ref 0.0–0.2)

## 2023-10-22 LAB — GLUCOSE, CAPILLARY
Glucose-Capillary: 115 mg/dL — ABNORMAL HIGH (ref 70–99)
Glucose-Capillary: 138 mg/dL — ABNORMAL HIGH (ref 70–99)
Glucose-Capillary: 167 mg/dL — ABNORMAL HIGH (ref 70–99)
Glucose-Capillary: 192 mg/dL — ABNORMAL HIGH (ref 70–99)
Glucose-Capillary: 153 mg/dL — ABNORMAL HIGH (ref 70–99)

## 2023-10-22 LAB — BASIC METABOLIC PANEL
Anion gap: 13 (ref 5–15)
BUN: 12 mg/dL (ref 8–23)
CO2: 32 mmol/L (ref 22–32)
Calcium: 8.3 mg/dL — ABNORMAL LOW (ref 8.9–10.3)
Chloride: 94 mmol/L — ABNORMAL LOW (ref 98–111)
Creatinine, Ser: 0.41 mg/dL — ABNORMAL LOW (ref 0.44–1.00)
GFR, Estimated: >60 mL/min (ref >60)
Glucose, Bld: 178 mg/dL — ABNORMAL HIGH (ref 70–99)
Potassium: 3.2 mmol/L — ABNORMAL LOW (ref 3.5–5.1)
Sodium: 139 mmol/L (ref 135–145)

## 2023-10-22 LAB — APTT: aPTT: 70 s — ABNORMAL HIGH (ref 24–36)

## 2023-10-22 MED ORDER — AMLODIPINE BESYLATE 10 MG PO TABS
10.0000 mg | ORAL_TABLET | Freq: Every day | ORAL | Status: DC
Start: 2023-10-22 — End: 2023-10-25
  Administered 2023-10-22 – 2023-10-23 (×2): 10 mg via ORAL
  Filled 2023-10-22 (×2): qty 1

## 2023-10-22 MED ORDER — ATENOLOL 100 MG PO TABS
100.0000 mg | ORAL_TABLET | Freq: Every day | ORAL | Status: DC
  Administered 2023-10-22 – 2023-10-25 (×4): 100 mg via ORAL
  Filled 2023-10-22: qty 1
  Filled 2023-10-22: qty 4
  Filled 2023-10-22: qty 1
  Filled 2023-10-22: qty 4

## 2023-10-22 MED ORDER — METHOCARBAMOL 500 MG PO TABS
750.0000 mg | ORAL_TABLET | Freq: Four times a day (QID) | ORAL | Status: DC
  Administered 2023-10-22 – 2023-10-26 (×16): 750 mg via ORAL
  Filled 2023-10-22: qty 1
  Filled 2023-10-22 (×3): qty 2
  Filled 2023-10-22 (×2): qty 1
  Filled 2023-10-22 (×7): qty 2
  Filled 2023-10-22 (×2): qty 1
  Filled 2023-10-22: qty 2
  Filled 2023-10-22: qty 1
  Filled 2023-10-22: qty 2

## 2023-10-22 MED ORDER — SERTRALINE HCL 50 MG PO TABS
25.0000 mg | ORAL_TABLET | Freq: Every day | ORAL | Status: DC
  Administered 2023-10-22 – 2023-10-25 (×4): 25 mg via ORAL
  Filled 2023-10-22 (×4): qty 1

## 2023-10-22 MED ORDER — FUROSEMIDE 10 MG/ML IJ SOLN
40.0000 mg | Freq: Once | INTRAMUSCULAR | Status: AC
  Administered 2023-10-22: 40 mg via INTRAVENOUS
  Filled 2023-10-22: qty 4

## 2023-10-22 MED ORDER — ACETAMINOPHEN 325 MG PO TABS: 650.0000 mg | ORAL_TABLET | Freq: Four times a day (QID) | ORAL | Status: DC | PRN

## 2023-10-22 MED ORDER — GABAPENTIN 300 MG PO CAPS
300.0000 mg | ORAL_CAPSULE | Freq: Three times a day (TID) | ORAL | Status: DC
  Administered 2023-10-22 – 2023-10-26 (×12): 300 mg via ORAL
  Filled 2023-10-22 (×12): qty 1

## 2023-10-22 MED ORDER — ATORVASTATIN CALCIUM 20 MG PO TABS
10.0000 mg | ORAL_TABLET | Freq: Every day | ORAL | Status: DC
  Administered 2023-10-22 – 2023-10-26 (×5): 10 mg via ORAL
  Filled 2023-10-22 (×5): qty 1

## 2023-10-22 MED ORDER — ENSURE ENLIVE PO LIQD
237.0000 mL | Freq: Three times a day (TID) | ORAL | Status: DC
  Administered 2023-10-22 – 2023-10-26 (×7): 237 mL via ORAL

## 2023-10-22 MED ORDER — APIXABAN 5 MG PO TABS: 5.0000 mg | ORAL_TABLET | Freq: Two times a day (BID) | ORAL | Status: DC

## 2023-10-22 MED ORDER — POTASSIUM CHLORIDE 20 MEQ PO PACK
40.0000 meq | PACK | Freq: Once | ORAL | Status: AC
  Administered 2023-10-22: 40 meq via ORAL
  Filled 2023-10-22: qty 2

## 2023-10-22 MED ORDER — APIXABAN 5 MG PO TABS
10.0000 mg | ORAL_TABLET | Freq: Two times a day (BID) | ORAL | Status: DC
  Administered 2023-10-22 – 2023-10-26 (×9): 10 mg via ORAL
  Filled 2023-10-22 (×11): qty 2

## 2023-10-22 MED ORDER — DONEPEZIL HCL 5 MG PO TABS
5.0000 mg | ORAL_TABLET | Freq: Every day | ORAL | Status: DC
  Administered 2023-10-22 – 2023-10-25 (×4): 5 mg via ORAL
  Filled 2023-10-22 (×4): qty 1

## 2023-10-22 MED ORDER — INSULIN ASPART 100 UNIT/ML IJ SOLN
0.0000 [IU] | Freq: Three times a day (TID) | INTRAMUSCULAR | Status: DC
  Administered 2023-10-22 – 2023-10-23 (×2): 2 [IU] via SUBCUTANEOUS
  Administered 2023-10-23: 1 [IU] via SUBCUTANEOUS
  Administered 2023-10-23 – 2023-10-24 (×2): 2 [IU] via SUBCUTANEOUS
  Administered 2023-10-24 (×2): 1 [IU] via SUBCUTANEOUS
  Administered 2023-10-25 (×2): 3 [IU] via SUBCUTANEOUS
  Administered 2023-10-25 – 2023-10-26 (×2): 1 [IU] via SUBCUTANEOUS
  Filled 2023-10-22 (×11): qty 1

## 2023-10-22 MED ORDER — IPRATROPIUM-ALBUTEROL 0.5-2.5 (3) MG/3ML IN SOLN
3.0000 mL | Freq: Two times a day (BID) | RESPIRATORY_TRACT | Status: DC
  Administered 2023-10-22 – 2023-10-25 (×6): 3 mL via RESPIRATORY_TRACT
  Filled 2023-10-22 (×6): qty 3

## 2023-10-22 MED ORDER — INSULIN ASPART 100 UNIT/ML IJ SOLN
0.0000 [IU] | Freq: Every day | INTRAMUSCULAR | Status: DC
  Administered 2023-10-24: 3 [IU] via SUBCUTANEOUS
  Filled 2023-10-22: qty 1

## 2023-10-22 MED ORDER — FOLIC ACID 0.5 MG HALF TAB
500.0000 ug | ORAL_TABLET | Freq: Every day | ORAL | Status: DC
  Administered 2023-10-22 – 2023-10-26 (×4): 0.5 mg via ORAL
  Filled 2023-10-22 (×4): qty 1

## 2023-10-22 MED ORDER — SPIRONOLACTONE 25 MG PO TABS
25.0000 mg | ORAL_TABLET | Freq: Every day | ORAL | Status: DC
  Administered 2023-10-22 – 2023-10-26 (×4): 25 mg via ORAL
  Filled 2023-10-22 (×4): qty 1

## 2023-10-22 MED ORDER — FUROSEMIDE 40 MG PO TABS
40.0000 mg | ORAL_TABLET | Freq: Every day | ORAL | Status: DC
  Administered 2023-10-23 – 2023-10-26 (×4): 40 mg via ORAL
  Filled 2023-10-22: qty 1
  Filled 2023-10-22: qty 2
  Filled 2023-10-22 (×2): qty 1

## 2023-10-22 NOTE — Evaluation (Signed)
Occupational Therapy Evaluation Patient Details Name: Tami Cummings MRN: [id number] DOB: 10-26-1946 Today's Date: 10/22/2023   History of Present Illness 77 y.o. female with medical history significant of DM2, dementia who presented from [facility] for concern for possible PNA. Pt transferred to ICU and after rapid called on 10/29 intubated and chest tube placed. On 11/2, pt was extubated and chest tube has been removed.   Clinical Impression   Patient presenting with decreased Ind in self care,balance, functional mobility/transfers, endurance, and safety awareness. Patient with dementia at baseline and LTC resident of [facility]. Pt transfers to wheelchair with staff assist at baseline and they also assist her with self care needs. Patient's orientation has improved and she is oriented to self, location, and DOB. She performs bed mobility with min - mod A this session and is able to perform grooming tasks while seated on EOB with set up A and supervision for balance. Pt attempts lateral scoots with much effort but not covering much ground. Pt reports fatigue and needing assistance to return to bed. Patient will benefit from acute OT to increase overall independence in the areas of ADLs, functional mobility, and safety awareness in order to safely discharge.       If plan is discharge home, recommend the following: A lot of help with walking and/or transfers;A lot of help with bathing/dressing/bathroom;Assistance with cooking/housework;Assist for transportation;Help with stairs or ramp for entrance    Functional Status Assessment  Patient has had a recent decline in their functional status and demonstrates the ability to make significant improvements in function in a reasonable and predictable amount of time.  Equipment Recommendations  None recommended by OT    Recommendations for Other Services       Precautions / Restrictions Precautions Precautions:  Fall Precaution Comments: fecal system and foley Restrictions Weight Bearing Restrictions: No      Mobility Bed Mobility Overal bed mobility: Needs Assistance Bed Mobility: Supine to Sit, Sit to Supine     Supine to sit: Min assist Sit to supine: Mod assist        Transfers                   General transfer comment: unable to attempt secondary to pt fatigue      Balance Overall balance assessment: Needs assistance Sitting-balance support: Bilateral upper extremity supported Sitting balance-Leahy Scale: Fair                                     ADL either performed or assessed with clinical judgement   ADL                                         General ADL Comments: Pt able to sit EOB with close supervision and wash face with set up A     Vision Patient Visual Report: No change from baseline       Perception         Praxis         Pertinent Vitals/Pain Pain Assessment Pain Assessment: Faces Faces Pain Scale: Hurts a little bit Pain Location: generalized Pain Descriptors / Indicators: Discomfort Pain Intervention(s): Limited activity within patient's tolerance, Repositioned, Monitored during session     Extremity/Trunk Assessment Upper Extremity Assessment Upper Extremity Assessment: Generalized weakness  Lower Extremity Assessment Lower Extremity Assessment: Generalized weakness       Communication Communication Communication: No apparent difficulties   Cognition Arousal: Alert Behavior During Therapy: WFL for tasks assessed/performed Overall Cognitive Status: History of cognitive impairments - at baseline                                 General Comments: Pt is oriented to self, location, and DOB.     General Comments       Exercises     Shoulder Instructions      Home Living Family/patient expects to be discharged to:: Skilled nursing facility                                         Prior Functioning/Environment Prior Level of Function : Needs assist             Mobility Comments: Pt does not ambulate at baseline per staff report ADLs Comments: Pt is LTC resident of [facility]. OT called and spoke with staff who report pt has baseline confusion and is staff assist for stand pivot to wheelchair. Pt needs assistance for self care needs from bed level per staff report.        OT Problem List: Decreased strength;Decreased activity tolerance;Decreased safety awareness;Impaired balance (sitting and/or standing);Decreased knowledge of use of DME or AE;Decreased knowledge of precautions      OT Treatment/Interventions: Self-care/ADL training;Therapeutic exercise;Therapeutic activities;Energy conservation;DME and/or AE instruction;Patient/family education;Balance training    OT Goals(Current goals can be found in the care plan section) Acute Rehab OT Goals Patient Stated Goal: to get stronger OT Goal Formulation: With patient Time For Goal Achievement: 11/05/23 Potential to Achieve Goals: Fair  OT Frequency: Min 1X/week    Co-evaluation              AM-PAC OT "6 Clicks" Daily Activity     Outcome Measure Help from another person eating meals?: A Little Help from another person taking care of personal grooming?: A Little Help from another person toileting, which includes using toliet, bedpan, or urinal?: A Lot Help from another person bathing (including washing, rinsing, drying)?: A Lot Help from another person to put on and taking off regular upper body clothing?: A Little Help from another person to put on and taking off regular lower body clothing?: A Lot 6 Click Score: 15   End of Session Equipment Utilized During Treatment: Oxygen Nurse Communication: Mobility status  Activity Tolerance: Patient tolerated treatment well Patient left: in bed;with call bell/phone within reach;with bed alarm set  OT Visit Diagnosis:  Unsteadiness on feet (R26.81);Repeated falls (R29.6);Muscle weakness (generalized) (M62.81)                Time: 4098-1191 OT Time Calculation (min): 16 min Charges:  OT General Charges $OT Visit: 1 Visit OT Evaluation $OT Eval Moderate Complexity: 1 Mod OT Treatments $Therapeutic Activity: 8-22 mins  Jackquline Denmark, MS, OTR/L , CBIS ascom [phone]  10/22/23, 10:48 AM

## 2023-10-22 NOTE — Progress Notes (Addendum)
[facility]  - [place] at [facility]   PATIENT NAME: Tami Cummings    MR#:  [id number]  DATE OF BIRTH:  1946/01/05  SUBJECTIVE:  patient seen earlier. No family at bedside. Has confusion/dementia at baseline. Came in with respiratory distress was intubated on the ventilator chest tube placement for pneumonia pleural effusion and pneumonia. Patient currently breathing comfortably sats more than 92% on 2 L unable to provide much history due to dementia.    VITALS:  Blood pressure (!) 174/69, pulse (!) 50, temperature 98.1 F (36.7 C), temperature source Oral, resp. rate (!) 29, height 5\' 1"  (1.549 m), weight 63.2 kg, SpO2 95%.  PHYSICAL EXAMINATION:   GENERAL:  77 y.o.-year-old patient with no acute distress. Appears chronically ill LUNGS: decreased breath sounds bilaterally, no wheezing CARDIOVASCULAR: S1, S2 normal. No murmur   ABDOMEN: Soft, nontender, nondistended.  EXTREMITIES: No  edema b/l.    NEUROLOGIC: nonfocal  patient is alert and awake SKIN:  post spinal fusion infection healed well LABORATORY PANEL:  CBC Recent Labs  Lab 10/22/23 0335  WBC 6.6  HGB 10.8*  HCT 35.6*  PLT 85*    Chemistries  Recent Labs  Lab 10/16/23 1147 10/16/23 1939 10/20/23 0204 10/20/23 1344 10/22/23 1150  NA 145   < > 144   < > 139  K 3.3*   < > 2.6*   < > 3.2*  CL 92*   < > 94*   < > 94*  CO2 41*   < > 38*   < > 32  GLUCOSE 159*   < > 110*   < > 178*  BUN 18   < > 16   < > 12  CREATININE 0.60   < > 0.33*   < > 0.41*  CALCIUM 8.9   < > 8.2*   < > 8.3*  MG  --    < > 2.1  --   --   AST 20  --   --   --   --   ALT 15  --   --   --   --   ALKPHOS 83  --   --   --   --   BILITOT 0.9  --   --   --   --    < > = values in this interval not displayed.   Assessment and Plan Tami Cummings is a 77 y.o. female with medical history significant of DM2, dementia who presented from [facility] for concern for possible PNA.   PER [facility] notes--The patient  was recently hospitalized (08/31/23 - 09/10/23) for wound dehiscence after back surgery of T9-T12 fusion (done 07/30/23) following an injury that occurred in July of this year. After a washout she was discharged to a SNF with a PICC line for Vancomycin and Rocephin scheduled to d/c abx 10/11/23.   10/11/23: admitted to hospital service and treated for acute hypoxic respiratory failure 10/25: Agitated, trying to get OOB 10/26: Confused, agitated, trying to get OOB.  Required Haldol and Zyprexa. 10/27: Confused, refusing medications 10/28: excessive secretions in upper airway requiring suctioning 10/29: Transferred to the ICU due to increased work of breathing and O2 desaturation requiring mechanical ventilation and vasopressor support 10/30: Pt remains mechanically intubated vent settings improving: FiO2 45%/PEEP 10.  Requiring levophed gtt @5  mcg/min and vasopressin @0 .04 units/min.  Right-sided chest tube placed due to large pleural effusion  10/31: Pt no longer requiring vasopressors and maintain map 65 or higher.  During WUA pts following commands.  Performed SBT, however became tachypneic with accessory muscle use and hypoxic placed back on full support.  Will diurese with 60 mg iv lasix x1 dose.  Will reattempt SBT following diuresis  11/1: On minimal vent support, Diurese again with 60 mg IV Lasix x1 dose then perform SBT as tolerated.  With worsening thrombocytopenia, start Argatroban while ruling out HIT and DIC.  In the afternoon family requests 1 way extubation with transition to Comfort Measures should she not tolerate extubation 11/2: comfortable on hi-flo nasal cannula 11/4-- assumed care of pt from today. On 2 L nasal count oxygen. Breathing comfortably. I will transfer out of ICU. No family at bedside. Will have PT OT see patient. Patient's pigtail catheter was removed yesterday by pulmonary.  Acute on chronic hypoxic hypercapnic respiratory failure bilateral pleural effusion status post  chest tube placement on the right side now removed bilateral pneumonia -- patient currently on 2 L nasal cannula oxygen -- continue IV antibiotics will switch to oral tomorrow. -- Incentive spirometer, flutter valve  Acute decompensated heart failure with preserved ejection fraction -- patient diaries in well. Creatinine stable -- will consider Lasix 40 mg from tomorrow  Acute metabolic encephalopathy in the setting of infection advanced dementia -- mentation appears to be nearing baseline  History of DVT -- resume eliquis -- HIDA antibiotic negative-- DC Argatroban  Type II diabetes with mild hyperglycemia, hyperlipidemia -- sliding scale insulin for now -- continue statins --A1c oct 2024--6.2% --will hold off insulin till po intake improves  Hypokalemia -- replace potassium  Hypertension  --resume atenolol and amlodipine  Transfer of ICU. PT OT to see patient.     Procedures: Family communication :dter Albin Felling Consults : none CODE STATUS: full DVT Prophylaxis : eliquis Level of care: Telemetry Medical Status is: Inpatient Remains inpatient appropriate because: respiratory failure    TOTAL TIME TAKING CARE OF THIS PATIENT: 35 minutes.  >50% time spent on counselling and coordination of care  Note: This dictation was prepared with Dragon dictation along with smaller phrase technology. Any transcriptional errors that result from this process are unintentional.  Enedina Finner M.D    [facility]   CC: Primary care physician; Larae Grooms, NP

## 2023-10-22 NOTE — Consult Note (Signed)
PHARMACY CONSULT NOTE - ELECTROLYTES  Pharmacy Consult for Electrolyte Monitoring and Replacement   Recent Labs: Height: 5\' 1"  (154.9 cm) Weight: 63.2 kg (139 lb 5.3 oz) IBW/kg (Calculated) : 47.8 Estimated Creatinine Clearance: 51 mL/min (A) (by C-G formula based on SCr of 0.41 mg/dL (L)). Potassium (mmol/L)  Date Value  10/22/2023 3.2 (L)   Magnesium (mg/dL)  Date Value  16/09/9603 2.1   Calcium (mg/dL)  Date Value  54/08/8118 8.3 (L)   Albumin (g/dL)  Date Value  14/78/2956 2.6 (L)  05/07/2023 3.9   Phosphorus (mg/dL)  Date Value  21/30/8657 2.1 (L)   Sodium (mmol/L)  Date Value  10/22/2023 139  05/07/2023 138   Corrected Ca: 9.42 mg/dL  Assessment  Tami Cummings is a 77 y.o. female presenting with dyspnea. PMH significant for type 2 diabetes and dementia. Pharmacy has been consulted to monitor and replace electrolytes.  Goal of Therapy: Electrolytes WNL  Plan:  40 mEq po KCl x 1 Check BMP, Mg, Phos with AM labs  Thank you for allowing pharmacy to be a part of this patient's care.  Lowella Bandy, PharmD Clinical Pharmacist 10/22/2023 12:37 PM

## 2023-10-22 NOTE — Consult Note (Signed)
PHARMACY - ANTICOAGULATION CONSULT NOTE  Pharmacy Consult for Argatroban  Indication: DVT  Allergies  Allergen Reactions   Empagliflozin    Actos [Pioglitazone] Other (See Comments)    CHF   Amlodipine Swelling    Leg swelling    Benazepril Other (See Comments)    angioedema   Hydrochlorothiazide Other (See Comments)    angioedema   Metformin And Related Diarrhea   Patient Measurements: Height: 5\' 1"  (154.9 cm) Weight: 63.2 kg (139 lb 5.3 oz) IBW/kg (Calculated) : 47.8  Vital Signs: Temp: 98.2 F (36.8 C) (11/04 0400) Temp Source: Oral (11/04 0400) BP: 150/83 (11/04 0400) Pulse Rate: 92 (11/04 0400)  Labs: Recent Labs    10/19/23 1106 10/19/23 1401 10/20/23 0204 10/20/23 0920 10/20/23 1344 10/21/23 0536 10/21/23 1636 10/22/23 0335  HGB  --   --  10.7*  --   --  10.3*  --  10.8*  HCT  --   --  34.8*  --   --  34.7*  --  35.6*  PLT  --   --  64*  --   --  61*  --  85*  APTT 38*   < > 37*   < > 51* 52* 62* 70*  LABPROT 16.4*  --   --   --   --   --   --   --   INR 1.3*  --   --   --   --   --   --   --   HEPARINUNFRC <0.10*  --   --   --   --   --   --   --   CREATININE  --    < > 0.33*  --  0.42* 0.35* 0.47  --    < > = values in this interval not displayed.   Estimated Creatinine Clearance: 51 mL/min (by C-G formula based on SCr of 0.47 mg/dL).  Medical History: Past Medical History:  Diagnosis Date   Arthritis    knee   COPD (chronic obstructive pulmonary disease) (HCC)    Diabetes mellitus without complication (HCC)    type 2   Dyspnea    Hyperlipidemia    Hypertension    controlled   Neuromuscular disorder (HCC)    numbness in hands and feet   Shoulder pain, right    Wears dentures    upper and lower   Medications:  Patient was on enoxaparin 40 mg SubQ q24h, last dose 10/28 @ 2128   Assessment: 77 yo female presented to [facility] due to acute respiratory failure. PMG includes T2DM, HFpEF, and dementia. Patient was started on heparin infusion on  10/29. Platelets have down trended since infusion was started and at 73 today. Pharmacy has been consulted for management of argatroban infusion.   Goal of Therapy:  aPTT 50-90 seconds  Monitor platelets by anticoagulation protocol: Yes  Labs: 11/01 1401 aPTT = 39; subtherapeutic 11/01 2014 aPTT = 48, subtherapeutic 11/02 0204 aPTT = 37, subtherapeutic 11/02 0920 aPTT = 50, therapeutic x1 11/02 1344 aPTT = 51, therapeutic x 2 11/03 0536 aPTT = 52, therapeutic x 3 11/03 1636 aPTT = 62, therapeutic x 4 11/04 0335 aPTT = 70, therapeutic x 5  Plan:  Continue argatroban infusion at 78.78 mcg/min  Check aPTT BID(0500 and 1700) CBC daily while on argatroban  F/u HITT testing   Otelia Sergeant, PharmD, [facility] 10/22/2023 4:58 AM

## 2023-10-22 NOTE — Progress Notes (Signed)
Daily Progress Note   Patient Name: Tami Cummings       Date: 10/22/2023 DOB: 1946-05-22  Age: 77 y.o. MRN#: [id number] Attending Physician: Enedina Finner, MD Primary Care Physician: Larae Grooms, NP Admit Date: 10/11/2023  Reason for Consultation/Follow-up: Establishing goals of care  Subjective: Notes and labs reviewed.  Into see patient.  Currently she is sitting in bed.  No family at bedside.  She knows where she is and can tell me why she is here.  She has a photo album and is able to tell me about her family.  She states she wants to leave the hospital and does not want to stay.  With broaching goals of care, she states she wants everything done to live as long as possible.  Will reattempt tomorrow.  Length of Stay: 11  Current Medications: Scheduled Meds:   amLODipine  10 mg Oral Daily   apixaban  10 mg Oral BID   Followed by   Melene Muller ON 10/29/2023] apixaban  5 mg Oral BID   atenolol  100 mg Oral Daily   atorvastatin  10 mg Oral Daily   Chlorhexidine Gluconate Cloth  6 each Topical Daily   docusate sodium  100 mg Oral BID   donepezil  5 mg Oral QHS   feeding supplement  237 mL Oral TID BM   folic acid  500 mcg Oral Daily   [START ON 10/23/2023] furosemide  40 mg Oral Daily   gabapentin  300 mg Oral TID   insulin aspart  0-5 Units Subcutaneous QHS   insulin aspart  0-9 Units Subcutaneous TID WC   ipratropium-albuterol  3 mL Nebulization BID   lidocaine  1 patch Transdermal Q24H   melatonin  2.5 mg Oral QHS   methocarbamol  750 mg Oral QID   multivitamin with minerals  1 tablet Oral Daily   potassium chloride  40 mEq Oral Once   sertraline  25 mg Oral QHS   spironolactone  25 mg Oral Daily    Continuous Infusions:  cefTRIAXone (ROCEPHIN)  IV Stopped (10/22/23  1258)    PRN Meds: acetaminophen, dextrose, hydrALAZINE, ondansetron (ZOFRAN) IV, ondansetron, mouth rinse, sodium chloride flush  Physical Exam Pulmonary:     Effort: Pulmonary effort is normal.  Neurological:     Mental Status: She is alert.  Vital Signs: BP (!) 174/69   Pulse (!) 50   Temp 98.1 F (36.7 C) (Oral)   Resp (!) 29   Ht 5\' 1"  (1.549 m)   Wt 63.2 kg   SpO2 95%   BMI 26.33 kg/m  SpO2: SpO2: 95 % O2 Device: O2 Device: Nasal Cannula O2 Flow Rate: O2 Flow Rate (L/min): 2 L/min  Intake/output summary:  Intake/Output Summary (Last 24 hours) at 10/22/2023 1554 Last data filed at 10/22/2023 1400 Gross per 24 hour  Intake 315.83 ml  Output 1315 ml  Net -999.17 ml   LBM: Last BM Date : 10/21/23 (rectal tube) Baseline Weight: Weight: 78 kg Most recent weight: Weight: 63.2 kg    Patient Active Problem List   Diagnosis Date Noted   Acute respiratory failure with hypoxia (HCC) 10/19/2023   Pleural effusion 10/18/2023   Acute on chronic diastolic congestive heart failure (HCC) 10/18/2023   Acute on chronic respiratory failure with hypoxia and hypercapnia (HCC) 10/17/2023   HAP (hospital-acquired pneumonia) 10/17/2023   Acute deep vein thrombosis (DVT) of calf muscle vein (HCC) 10/17/2023   Hypoxia 10/11/2023   Open T11 vertebral fracture (HCC) 07/23/2023   Acute midline thoracic back pain 07/20/2023   Adjustment disorder with anxious mood 06/29/2023   Mild dementia without behavioral disturbance, psychotic disturbance, mood disturbance, or anxiety (HCC) 01/23/2023   Osteopenia 01/01/2021   Vitamin D deficiency 10/18/2020   Vitamin B12 deficiency 10/18/2020   CKD (chronic kidney disease) stage 3, GFR 30-59 ml/min (HCC) 10/08/2020   Senile purpura (HCC) 04/10/2017   Morbid obesity (HCC) 04/10/2017   Advance care planning 04/10/2017   Noncompliance 10/30/2016   DM type 2 with diabetic peripheral neuropathy (HCC)    Hyperlipidemia associated with  type 2 diabetes mellitus (HCC)    Hypertension associated with diabetes [facility])     Palliative Care Assessment & Plan     Recommendations/Plan: PMT will follow.  Code Status:    Code Status Orders  (From admission, onward)           Start     Ordered   10/19/23 1533  Full code  (Code Status)  Continuous       Question:  By:  Answer:  Consent: discussion documented in EHR   10/19/23 1533           Code Status History     Date Active Date Inactive Code Status Order ID Comments User Context   10/19/2023 1453 10/19/2023 1533 Limited: Do not attempt resuscitation (DNR) -DNR-LIMITED -Do Not Intubate/DNI  [id number]  Judithe Modest, NP Inpatient   10/11/2023 1810 10/19/2023 1453 Full Code [id number]  Darlin Priestly, MD ED      Thank you for allowing the Palliative Medicine Team to assist in the care of this patient.   Morton Stall, NP  Please contact Palliative Medicine Team phone at [phone] for questions and concerns.

## 2023-10-22 NOTE — Progress Notes (Signed)
       CROSS COVER NOTE  NAME: Tami Cummings MRN: [id number] DOB : 24-Jul-1946    Date of Service   10/22/2023   HPI/Events of Note   SOB  CXR ordered: IMPRESSION: 1. Cardiomegaly with vascular congestion and edema. 2. Small bilateral pleural effusions with bibasilar atelectasis or infiltrate.  Interventions   Given lasix 40 mg IV Deep laryngeal suctioning ordered.     [name], MSN, APRN, AGACNP-BC [facility] [place] Pager: [phone]. Check Amion for Availability

## 2023-10-22 NOTE — Progress Notes (Signed)
Speech Language Pathology Treatment: Dysphagia  Patient Details Name: Tami Cummings MRN: [id number] DOB: 09-03-1946 Today's Date: 10/22/2023 Time: 1310-1350 SLP Time Calculation (min) (ACUTE ONLY): 40 min  Assessment / Plan / Recommendation Clinical Impression  Pt seen for ongoing assessment of swallowing; toleration of oral diet consistency. She was fully awake, talkative. This was a limited assessment; no full conclusion could be made re: pt's toleration of diet d/t pt Refusing ALL trials offered(ice cream, drinks, cookies) followed by pt expectorating the 1, half-attempted, tsp trial of sherbert ice cream. Pt flatly stated "NO" several times to offerings.  Pt was positioned upright in bed while stating "lay me back some", then offered different tsp trials of liquids and foods, mostly pudding, ice cream, and applesauce, cookie. She repeatedly stated "no". Encouraged her to try at least 1 tsp of sherbert which she expectorated. Nothing further attempted to not force pt to eat/drink, and to not increase agitation.  Pt was asked about her book she was holding; she showed me her picture book afterwards.   The above was shared w/ MD, Team. Shared that there is suspected impact from pt's Dementia on desire for oral intake as well as her refusal of oral intake. No changes in diet consistency recommended.  Recommend continue current dysphagia level 2 w/ thins w/ encouragement of oral intake during the day/meals; general aspiration precautions including full upright sitting and small bites/sips. Engage pt in self-feeding as much as possible. Oral care frequently during the day.  ST services will monitor pt's status while admitted. No diet upgrade is recommended at this time -- this diet consistency can offer support of easy to chew foods(oral phase support) while offering nutrition/hydration. ST can be available for further needs, education while admitted. NSG updated.  Recommend Palliative Care f/u  w/ Family, pt for further discussion of impact of Cognitive decline/Dementia on oral intake.        HPI HPI: Tami Cummings is a 77 y.o. female with medical history significant of Dementia, DM2, who presented from [facility] for concern for possible PNA on 10/11/2023. Patient was admitted for dyspnea and hypoxia. The patient was recently hospitalized (08/31/23 - 09/10/23) for wound dehiscence after back surgery of T9-T12 fusion (done 07/30/23) following an injury that occurred in July of this year. After a washout she was discharged to a SNF with a PICC line for Vancomycin and Rocephin scheduled to d/c abx 10/11/23. The patient has dementia as baseline and was given versed PRN d/t to agitation. A rapid response was called on the floor (10/16/2023) d/t suspected aspiration. The patient was saturating in the 60-70% with high flow [place] and was subsequently intubated. Pt admitted to critical care with extubation on 10/19/2023.   CXR/Imaging: prior to admit- "Bilateral pleural effusions with the opacities. Enlarged heart with  vascular congestion and trace edema.".  Post admit- "widespread Bilateral Pneumonia with extensive lower lobe  consolidation right > left. Superimposed Large layering pleural  effusions, favor transudate.  Nonspecific Ascites in the upper abdomen."; and "Appliances appear in satisfactory position.  2. Cardiac enlargement with mild perihilar edema.  3. Small left pleural effusion, increasing since prior study.  Bilateral basilar infiltration or atelectasis.".   Pt was intubated/extubated this admit w/ chest tube.  She has also had poor po intake during this admit and needed "something to help her sleep".      SLP Plan  Continue with current plan of care (x1-2)      Recommendations for follow up therapy  are one component of a multi-disciplinary discharge planning process, led by the attending physician.  Recommendations may be updated based on patient status, additional  functional criteria and insurance authorization.    Recommendations  Diet recommendations: Dysphagia 2 (fine chop);Thin liquid Liquids provided via: Cup;Straw (monitor) Medication Administration: Crushed with puree Supervision: Staff to assist with self feeding;Full supervision/cueing for compensatory strategies Compensations: Minimize environmental distractions;Small sips/bites;Slow rate;Lingual sweep for clearance of pocketing;Follow solids with liquid Postural Changes and/or Swallow Maneuvers: Out of bed for meals;Upright 30-60 min after meal;Seated upright 90 degrees                 (Palliative Care for GOC; Dietician) Oral care BID;Oral care before and after PO;Staff/trained caregiver to provide oral care   Frequent or constant Supervision/Assistance (d/t Dementia) Dysphagia, unspecified (R13.10) (suspect potential oral phase impact from the Dementia)     Continue with current plan of care (x1-2)       Jerilynn Som, MS, CCC-SLP Speech Language Pathologist Rehab Services; [facility] [place] [phone] (ascom) Airianna Kreischer  10/22/2023, 4:57 PM

## 2023-10-22 NOTE — Plan of Care (Signed)
  Problem: Education: Goal: Individualized Educational Video(s) Outcome: Progressing   Problem: Fluid Volume: Goal: Ability to maintain a balanced intake and output will improve Outcome: Progressing   Problem: Metabolic: Goal: Ability to maintain appropriate glucose levels will improve Outcome: Progressing   Problem: Nutritional: Goal: Maintenance of adequate nutrition will improve Outcome: Progressing Goal: Progress toward achieving an optimal weight will improve Outcome: Progressing   Problem: Skin Integrity: Goal: Risk for impaired skin integrity will decrease Outcome: Progressing   Problem: Tissue Perfusion: Goal: Adequacy of tissue perfusion will improve Outcome: Progressing   Problem: Health Behavior/Discharge Planning: Goal: Ability to manage health-related needs will improve Outcome: Progressing   Problem: Clinical Measurements: Goal: Ability to maintain clinical measurements within normal limits will improve Outcome: Progressing Goal: Will remain free from infection Outcome: Progressing Goal: Diagnostic test results will improve Outcome: Progressing Goal: Respiratory complications will improve Outcome: Progressing Goal: Cardiovascular complication will be avoided Outcome: Progressing   Problem: Coping: Goal: Level of anxiety will decrease Outcome: Progressing   Problem: Elimination: Goal: Will not experience complications related to bowel motility Outcome: Progressing Goal: Will not experience complications related to urinary retention Outcome: Progressing   Problem: Pain Management: Goal: General experience of comfort will improve Outcome: Progressing   Problem: Safety: Goal: Ability to remain free from injury will improve Outcome: Progressing   Problem: Skin Integrity: Goal: Risk for impaired skin integrity will decrease Outcome: Progressing   Problem: Activity: Goal: Ability to tolerate increased activity will improve Outcome:  Progressing   Problem: Respiratory: Goal: Ability to maintain adequate ventilation will improve Outcome: Progressing Goal: Ability to maintain a clear airway will improve Outcome: Progressing   Problem: Education: Goal: Ability to describe self-care measures that may prevent or decrease complications (Diabetes Survival Skills Education) will improve Outcome: Not Progressing   Problem: Coping: Goal: Ability to adjust to condition or change in health will improve Outcome: Not Progressing   Problem: Health Behavior/Discharge Planning: Goal: Ability to identify and utilize available resources and services will improve Outcome: Not Progressing Goal: Ability to manage health-related needs will improve Outcome: Not Progressing   Problem: Education: Goal: Knowledge of General Education information will improve Description: Including pain rating scale, medication(s)/side effects and non-pharmacologic comfort measures Outcome: Not Progressing   Problem: Activity: Goal: Risk for activity intolerance will decrease Outcome: Not Progressing   Problem: Nutrition: Goal: Adequate nutrition will be maintained Outcome: Not Progressing   Problem: Activity: Goal: Ability to tolerate increased activity will improve Outcome: Completed/Met   Problem: Respiratory: Goal: Ability to maintain a clear airway and adequate ventilation will improve Outcome: Completed/Met   Problem: Role Relationship: Goal: Method of communication will improve Outcome: Completed/Met   Problem: Activity: Goal: Ability to tolerate increased activity will improve Outcome: Completed/Met   Problem: Respiratory: Goal: Ability to maintain a clear airway and adequate ventilation will improve Outcome: Completed/Met   Problem: Role Relationship: Goal: Method of communication will improve Outcome: Completed/Met

## 2023-10-22 NOTE — Progress Notes (Addendum)
Nutrition Follow-up  DOCUMENTATION CODES:   Obesity unspecified  INTERVENTION:   Ensure Enlive po TID, each supplement provides 350 kcal and 20 grams of protein.  Magic cup TID with meals, each supplement provides 290 kcal and 9 grams of protein  MVI po daily   Pt at refeed risk  NUTRITION DIAGNOSIS:   Inadequate oral intake related to inability to eat as evidenced by NPO status.  GOAL:   Patient will meet greater than or equal to 90% of their needs -previously met with tube feeds   MONITOR:   PO intake, Supplement acceptance, Labs, Weight trends, I & O's, Skin  ASSESSMENT:   77 y/o female with h/o dementia, diabetes, COPD, CKD III, HTN, HTN and recent T9-T-12 fusion complicated by wound dehiscence and infection s/p revision in September who is admitted with PNA, shock, and bilateral pleural effusions s/p R chest tube placement 10/30.  Pt extubated 11/1. Visited pt's room today. Pt with some confusion today. Pt wanting to get out of bed so that she can do her laundry. Pt reports poor appetite and oral intake; pt reports that she did not eat any breakfast. RD discussed with pt the importance of adequate nutrition needed to preserve lean muscle. Pt reports that she is not going to eat anything or drink any Ensure until she is able to get out of bed. PT consult ordered for today. RD will add supplements and MVI to help pt meet her estimated needs. Pt remains at refeed risk. Per chart, pt appears to be down ~30lbs since admission; RD unsure if bed weights are correct. Pt looks similar on NFPE today.   Medications reviewed and include: colace, folic acid, insulin, melatonin, MVI, KCl, ceftriaxone   Labs reviewed: K 3.2(L), creat 0.41(L) P 2.1(L)- 11/3 Hgb 10.8(L), Hct 35.6(L) Cbgs- 167, 138, 115 x 24 hrs   Nutrition Focused Physical Exam:  Flowsheet Row Most Recent Value  Orbital Region No depletion  Upper Arm Region No depletion  Thoracic and Lumbar Region No depletion   Buccal Region No depletion  Temple Region Mild depletion  Clavicle Bone Region No depletion  Clavicle and Acromion Bone Region No depletion  Scapular Bone Region No depletion  Dorsal Hand No depletion  Patellar Region Mild depletion  Anterior Thigh Region Mild depletion  Posterior Calf Region Mild depletion  Edema (RD Assessment) Mild  Hair Reviewed  Eyes Reviewed  Mouth Reviewed  Skin Reviewed  Nails Reviewed   Diet Order:    Diet Order             DIET DYS 2 Fluid consistency: Thin  Diet effective now                  EDUCATION NEEDS:   Education needs have been addressed  Skin:  Skin Assessment: Reviewed RN Assessment  Last BM:  11/3- type 7  Height:   Ht Readings from Last 1 Encounters:  10/16/23 5\' 1"  (1.549 m)    Weight:   Wt Readings from Last 1 Encounters:  10/22/23 63.2 kg    Ideal Body Weight:  47.7 kg  BMI:  Body mass index is 26.33 kg/m.  Estimated Nutritional Needs:   Kcal:  1600-1800kcal/day  Protein:  80-90g/day  Fluid:  1.4-1.6L/day  Betsey Holiday MS, RD, LDN Please refer to [facility] for RD and/or RD on-call/weekend/after hours pager

## 2023-10-23 ENCOUNTER — Other Ambulatory Visit (HOSPITAL_COMMUNITY): Payer: Self-pay

## 2023-10-23 DIAGNOSIS — J90 Pleural effusion, not elsewhere classified: Secondary | ICD-10-CM | POA: Diagnosis not present

## 2023-10-23 DIAGNOSIS — I82469 Acute embolism and thrombosis of unspecified calf muscular vein: Secondary | ICD-10-CM | POA: Diagnosis not present

## 2023-10-23 DIAGNOSIS — Z7189 Other specified counseling: Secondary | ICD-10-CM | POA: Diagnosis not present

## 2023-10-23 DIAGNOSIS — J189 Pneumonia, unspecified organism: Secondary | ICD-10-CM | POA: Diagnosis not present

## 2023-10-23 DIAGNOSIS — J9621 Acute and chronic respiratory failure with hypoxia: Secondary | ICD-10-CM | POA: Diagnosis not present

## 2023-10-23 DIAGNOSIS — R0902 Hypoxemia: Secondary | ICD-10-CM | POA: Diagnosis not present

## 2023-10-23 LAB — SEROTONIN RELEASE ASSAY (SRA)
SRA .2 IU/mL UFH Ser-aCnc: <1 % (ref 0–20)
SRA 100IU/mL UFH Ser-aCnc: <1 % (ref 0–20)

## 2023-10-23 LAB — BASIC METABOLIC PANEL
Anion gap: 10 (ref 5–15)
BUN: 13 mg/dL (ref 8–23)
CO2: 34 mmol/L — ABNORMAL HIGH (ref 22–32)
Calcium: 8.3 mg/dL — ABNORMAL LOW (ref 8.9–10.3)
Chloride: 96 mmol/L — ABNORMAL LOW (ref 98–111)
Creatinine, Ser: 0.39 mg/dL — ABNORMAL LOW (ref 0.44–1.00)
GFR, Estimated: >60 mL/min (ref >60)
Glucose, Bld: 138 mg/dL — ABNORMAL HIGH (ref 70–99)
Potassium: 3.1 mmol/L — ABNORMAL LOW (ref 3.5–5.1)
Sodium: 140 mmol/L (ref 135–145)

## 2023-10-23 LAB — GLUCOSE, CAPILLARY
Glucose-Capillary: 132 mg/dL — ABNORMAL HIGH (ref 70–99)
Glucose-Capillary: 200 mg/dL — ABNORMAL HIGH (ref 70–99)
Glucose-Capillary: 198 mg/dL — ABNORMAL HIGH (ref 70–99)
Glucose-Capillary: 104 mg/dL — ABNORMAL HIGH (ref 70–99)

## 2023-10-23 LAB — TRIGLYCERIDES, BODY FLUIDS: Triglycerides, Fluid: 18 mg/dL

## 2023-10-23 LAB — MAGNESIUM: Magnesium: 2.1 mg/dL (ref 1.7–2.4)

## 2023-10-23 LAB — PHOSPHORUS: Phosphorus: 3.2 mg/dL (ref 2.5–4.6)

## 2023-10-23 MED ORDER — POTASSIUM CHLORIDE 20 MEQ PO PACK
40.0000 meq | PACK | ORAL | Status: DC
Start: 2023-10-23 — End: 2023-10-23

## 2023-10-23 MED ORDER — POTASSIUM CHLORIDE 10 MEQ/100ML IV SOLN
10.0000 meq | INTRAVENOUS | Status: AC
  Administered 2023-10-23 (×3): 10 meq via INTRAVENOUS
  Filled 2023-10-23 (×3): qty 100

## 2023-10-23 NOTE — Progress Notes (Signed)
Pt noted to have increased work of breathing. Sats mid 6s. O2 titrated to 4L [place], encouraged pt to cough. Tachypneic to 30s with weak, congested cough. Lung sounds coarse/rhoncorous. MD Geradine Girt notified. CXR obtained, lasix administered, and deep suction provided. MD to bedside to assess pt. Pt sats now maintaing low 90s, RR to baseline; appears more comfortable.

## 2023-10-23 NOTE — TOC Benefit Eligibility Note (Signed)
Patient Product/process development scientist completed.    The patient is insured through [facility]. Patient has Medicare and is not eligible for a copay card, but may be able to apply for patient assistance, if available.    Ran test claim for Eliquis 5 mg and the current 30 day co-pay is $0.00.   This test claim was processed through [facility]- copay amounts may vary at other pharmacies due to pharmacy/plan contracts, or as the patient moves through the different stages of their insurance plan.     Roland Earl, CPHT Pharmacy Technician III Certified Patient Advocate [facility] Pharmacy Patient Advocate Team Direct Number: [phone]  Fax: [phone]

## 2023-10-23 NOTE — Consult Note (Signed)
PHARMACY CONSULT NOTE - ELECTROLYTES  Pharmacy Consult for Electrolyte Monitoring and Replacement   Recent Labs: Height: 5\' 1"  (154.9 cm) Weight: 61.8 kg (136 lb 3.9 oz) IBW/kg (Calculated) : 47.8 Estimated Creatinine Clearance: 50.4 mL/min (A) (by C-G formula based on SCr of 0.39 mg/dL (L)).  Potassium (mmol/L)  Date Value  10/23/2023 3.1 (L)   Magnesium (mg/dL)  Date Value  16/09/9603 2.1   Calcium (mg/dL)  Date Value  54/08/8118 8.3 (L)   Albumin (g/dL)  Date Value  14/78/2956 2.6 (L)  05/07/2023 3.9   Phosphorus (mg/dL)  Date Value  21/30/8657 3.2   Sodium (mmol/L)  Date Value  10/23/2023 140  05/07/2023 138   Corrected Ca: 9.42 mg/dL  Assessment  Tami Cummings is a 77 y.o. female presenting with dyspnea. PMH significant for type 2 diabetes and dementia. Pharmacy has been consulted to monitor and replace electrolytes.  Goal of Therapy: Electrolytes WNL  Plan:  K+ 3.1; KCl 40 mEq PO Q4H x 2 doses ordered  Check BMP, Mg, Phos with AM labs  Thank you for allowing pharmacy to be a part of this patient's care.  Littie Deeds, PharmD Pharmacy Resident  10/23/2023 7:23 AM

## 2023-10-23 NOTE — Plan of Care (Signed)
  Problem: Education: Goal: Individualized Educational Video(s) Outcome: Progressing   Problem: Fluid Volume: Goal: Ability to maintain a balanced intake and output will improve Outcome: Progressing   Problem: Metabolic: Goal: Ability to maintain appropriate glucose levels will improve Outcome: Progressing   Problem: Nutritional: Goal: Progress toward achieving an optimal weight will improve Outcome: Progressing   Problem: Tissue Perfusion: Goal: Adequacy of tissue perfusion will improve Outcome: Progressing   Problem: Clinical Measurements: Goal: Ability to maintain clinical measurements within normal limits will improve Outcome: Progressing Goal: Will remain free from infection Outcome: Progressing Goal: Diagnostic test results will improve Outcome: Progressing Goal: Respiratory complications will improve Outcome: Progressing Goal: Cardiovascular complication will be avoided Outcome: Progressing   Problem: Coping: Goal: Level of anxiety will decrease Outcome: Progressing   Problem: Elimination: Goal: Will not experience complications related to bowel motility Outcome: Progressing Goal: Will not experience complications related to urinary retention Outcome: Progressing   Problem: Pain Management: Goal: General experience of comfort will improve Outcome: Progressing   Problem: Safety: Goal: Ability to remain free from injury will improve Outcome: Progressing   Problem: Activity: Goal: Ability to tolerate increased activity will improve Outcome: Progressing   Problem: Respiratory: Goal: Ability to maintain adequate ventilation will improve Outcome: Progressing Goal: Ability to maintain a clear airway will improve Outcome: Progressing   Problem: Education: Goal: Ability to describe self-care measures that may prevent or decrease complications (Diabetes Survival Skills Education) will improve Outcome: Not Progressing   Problem: Coping: Goal: Ability to  adjust to condition or change in health will improve Outcome: Not Progressing   Problem: Health Behavior/Discharge Planning: Goal: Ability to identify and utilize available resources and services will improve Outcome: Not Progressing Goal: Ability to manage health-related needs will improve Outcome: Not Progressing   Problem: Nutritional: Goal: Maintenance of adequate nutrition will improve Outcome: Not Progressing   Problem: Skin Integrity: Goal: Risk for impaired skin integrity will decrease Outcome: Not Progressing   Problem: Education: Goal: Knowledge of General Education information will improve Description: Including pain rating scale, medication(s)/side effects and non-pharmacologic comfort measures Outcome: Not Progressing   Problem: Health Behavior/Discharge Planning: Goal: Ability to manage health-related needs will improve Outcome: Not Progressing   Problem: Activity: Goal: Risk for activity intolerance will decrease Outcome: Not Progressing   Problem: Nutrition: Goal: Adequate nutrition will be maintained Outcome: Not Progressing   Problem: Skin Integrity: Goal: Risk for impaired skin integrity will decrease Outcome: Not Progressing

## 2023-10-23 NOTE — Progress Notes (Signed)
Pt has orders to transfer to [facility], Report called to [facility]. All belongings sent with patient. VSS at this time. Called daughter to notify about transfer but n/a.

## 2023-10-23 NOTE — Evaluation (Signed)
Physical Therapy Evaluation Patient Details Name: Tami Cummings MRN: [id number] DOB: 1946-08-08 Today's Date: 10/23/2023  History of Present Illness  presented to ER secondary to progressive SOB x3-4 days; admitted for management of acute hypoxic respiratory failure secondary to bilat pleural effusions.  Hospital course significant for intubation (10/29-11/1) with one-way extubation; chest tube placement (removed 11/3).  Recent PMH significant for T9-12 fusion, complicated by wound dehiscence with subsequent washout/closure 9/13 at outside facility.  Clinical Impression  Patient resting in bed upon arrival to room; alert and oriented to self, location as hospital.  Follows approx 75% simple commands, but somewhat restless/anxious, perseverative on "needing to go home".  No clinical indicators of pain noted throughout session.  Generally weak and deconditioned due to acute hospital stay, but no significant focal weakness appreciated.  Currently requiring min/mod assist for bed mobility; close sup for unsupported sitting balance and min assist for lateral scooting edge of bed.  does lift/clear buttocks this date (improved from OT eval), but requries physical assist from therapist for adequate lateral movement Sats 86-89% throughout session on 2L; improved to 88-89% on 3L (limited impact due to mouth breathing). RN/MD informed/aware    Would benefit from skilled PT to address above deficits and promote optimal return to PLOF.; recommend post-acute PT follow up as indicated by interdisciplinary care team.          If plan is discharge home, recommend the following: A lot of help with walking and/or transfers;A lot of help with bathing/dressing/bathroom   Can travel by private vehicle   No    Equipment Recommendations    Recommendations for Other Services       Functional Status Assessment Patient has had a recent decline in their functional status and demonstrates the ability to make  significant improvements in function in a reasonable and predictable amount of time.     Precautions / Restrictions Precautions Precautions: Fall Restrictions Weight Bearing Restrictions: No      Mobility  Bed Mobility Overal bed mobility: Needs Assistance Bed Mobility: Supine to Sit, Sit to Supine     Supine to sit: Min assist, Mod assist Sit to supine: Min assist, Mod assist        Transfers Overall transfer level: Needs assistance                 General transfer comment: lateral scooting edge of bed, min assist; does lift/clear buttocks this date (improved from OT eval), but requries physical assist from therapist for adequate lateral movement    Ambulation/Gait               General Gait Details: non-ambulatory at baseline  Stairs            Wheelchair Mobility     Tilt Bed    Modified Rankin (Stroke Patients Only)       Balance Overall balance assessment: Needs assistance Sitting-balance support: No upper extremity supported, Feet supported Sitting balance-Leahy Scale: Fair                                       Pertinent Vitals/Pain Pain Assessment Pain Assessment: No/denies pain    Home Living Family/patient expects to be discharged to:: Skilled nursing facility                        Prior Function Prior Level of Function : Needs assist  Mobility Comments: Pt does not ambulate at baseline per staff report.  Per notes, appears WC level as primary mobility with assist from staff for transfers ADLs Comments: Pt is LTC resident of [facility]. OT called and spoke with staff who report pt has baseline confusion and is staff assist for stand pivot to wheelchair. Pt needs assistance for self care needs from bed level per staff report.     Extremity/Trunk Assessment   Upper Extremity Assessment Upper Extremity Assessment: Generalized weakness    Lower Extremity Assessment Lower  Extremity Assessment: Generalized weakness (grossly at least 4-/5; no significant focal weakness appreciated)       Communication   Communication Communication: No apparent difficulties  Cognition   Behavior During Therapy: Restless Overall Cognitive Status: No family/caregiver present to determine baseline cognitive functioning                                 General Comments: Oriented to self and location as hospital; follows approx 75% simple commands, but requires frequent redirection to task.  Very perseverative on being 'ready to go home'        General Comments      Exercises Other Exercises Other Exercises: Repositioned in bed for more upright trunk position to aid in pulmonary hygiene; educated/encouraged for coughing (assisted with pillow squeeze as needed) to mobilize secretions.  Sats 86-89% throughout session on 2L; improved to 88-89% on 3L (limited impact due to mouth breathing).  RN/MD informed/aware   Assessment/Plan    PT Assessment Patient needs continued PT services  PT Problem List Decreased strength;Decreased activity tolerance;Decreased balance;Decreased mobility;Decreased cognition;Decreased knowledge of use of DME;Decreased safety awareness;Decreased knowledge of precautions;Cardiopulmonary status limiting activity       PT Treatment Interventions DME instruction;Functional mobility training;Therapeutic activities;Therapeutic exercise;Balance training;Patient/family education    PT Goals (Current goals can be found in the Care Plan section)  Acute Rehab PT Goals Patient Stated Goal: to go home PT Goal Formulation: With patient Time For Goal Achievement: 11/06/23 Potential to Achieve Goals: Fair    Frequency Min 1X/week     Co-evaluation               AM-PAC PT "6 Clicks" Mobility  Outcome Measure Help needed turning from your back to your side while in a flat bed without using bedrails?: A Little Help needed moving from lying  on your back to sitting on the side of a flat bed without using bedrails?: A Lot Help needed moving to and from a bed to a chair (including a wheelchair)?: A Lot Help needed standing up from a chair using your arms (e.g., wheelchair or bedside chair)?: A Lot Help needed to walk in hospital room?: Total Help needed climbing 3-5 steps with a railing? : Total 6 Click Score: 11    End of Session   Activity Tolerance: Patient tolerated treatment well Patient left: in bed;with call bell/phone within reach;with bed alarm set Nurse Communication: Mobility status PT Visit Diagnosis: Muscle weakness (generalized) (M62.81)    Time: 1541-1600 PT Time Calculation (min) (ACUTE ONLY): 19 min   Charges:   PT Evaluation $PT Eval Moderate Complexity: 1 Mod   PT General Charges $$ ACUTE PT VISIT: 1 Visit         Garlene Apperson H. Manson Passey, PT, DPT, NCS 10/23/23, 11:17 PM [phone]

## 2023-10-23 NOTE — Progress Notes (Signed)
[facility]  - [place] at [facility]   PATIENT NAME: Tami Cummings    MR#:  [id number]  DATE OF BIRTH:  [birth year]  SUBJECTIVE:  patient seen earlier. No family at bedside. Has confusion/dementia at baseline. No new issues per RN. Has some lingering cough. Sats remains stable.   VITALS:  Blood pressure (!) 125/50, pulse 75, temperature 98.5 F (36.9 C), temperature source Oral, resp. rate 17, height 5\' 1"  (1.549 m), weight 61.8 kg, SpO2 95%.  PHYSICAL EXAMINATION:   GENERAL:  77 y.o.-year-old patient with no acute distress. Appears chronically ill LUNGS: decreased breath sounds bilaterally, no wheezing CARDIOVASCULAR: S1, S2 normal. No murmur   ABDOMEN: Soft, nontender, nondistended.  EXTREMITIES: No  edema b/l.    NEUROLOGIC: nonfocal  patient is alert and awake SKIN:  post spinal fusion infection healed well LABORATORY PANEL:  CBC Recent Labs  Lab 10/22/23 0335  WBC 6.6  HGB 10.8*  HCT 35.6*  PLT 85*    Chemistries  Recent Labs  Lab 10/23/23 0451  NA 140  K 3.1*  CL 96*  CO2 34*  GLUCOSE 138*  BUN 13  CREATININE 0.39*  CALCIUM 8.3*  MG 2.1   Assessment and Plan Tami Cummings is a 77 y.o. female with medical history significant of DM2, dementia who presented from [facility] for concern for possible PNA.   PER [facility] notes--The patient was recently hospitalized (08/31/23 - 09/10/23) for wound dehiscence after back surgery of T9-T12 fusion (done 07/30/23) following an injury that occurred in July of this year. After a washout she was discharged to a SNF with a PICC line for Vancomycin and Rocephin scheduled to d/c abx 10/11/23.   10/11/23: admitted to hospital service and treated for acute hypoxic respiratory failure 10/25: Agitated, trying to get OOB 10/26: Confused, agitated, trying to get OOB.  Required Haldol and Zyprexa. 10/27: Confused, refusing medications 10/28: excessive secretions in upper airway requiring  suctioning 10/29: Transferred to the ICU due to increased work of breathing and O2 desaturation requiring mechanical ventilation and vasopressor support 10/30: Pt remains mechanically intubated vent settings improving: FiO2 45%/PEEP 10.  Requiring levophed gtt @5  mcg/min and vasopressin @0 .04 units/min.  Right-sided chest tube placed due to large pleural effusion  10/31: Pt no longer requiring vasopressors and maintain map 65 or higher.  During WUA pts following commands.  Performed SBT, however became tachypneic with accessory muscle use and hypoxic placed back on full support.  Will diurese with 60 mg iv lasix x1 dose.  Will reattempt SBT following diuresis  11/1: On minimal vent support, Diurese again with 60 mg IV Lasix x1 dose then perform SBT as tolerated.  With worsening thrombocytopenia, start Argatroban while ruling out HIT and DIC.  In the afternoon family requests 1 way extubation with transition to Comfort Measures should she not tolerate extubation 11/2: comfortable on hi-flo nasal cannula 11/4-- assumed care of pt from today. On 2 L nasal count oxygen. Breathing comfortably. I will transfer out of ICU. No family at bedside. Will have PT OT see patient. Patient's pigtail catheter was removed yesterday by pulmonary. 11/5-- overall doing well. Some cough. If remains stable patient will discharge to [facility] tomorrow. Discussed with [name] on the phone  Acute on chronic hypoxic hypercapnic respiratory failure bilateral pleural effusion status post chest tube placement on the right side now removed bilateral pneumonia -- patient currently on 2 L nasal cannula oxygen -- completed seven days of antibiotics -- Incentive spirometer, flutter  valve  Acute decompensated heart failure with preserved ejection fraction -- patient diuresed. Creatinine stable -- start Lasix 40 mg  qd  Acute metabolic encephalopathy in the setting of infection advanced dementia -- mentation  appears to be nearing baseline  History of DVT -- resume eliquis -- HIDA antibiotic negative-- DC Argatroban  Type II diabetes with mild hyperglycemia, hyperlipidemia -- sliding scale insulin for now -- continue statins --A1c oct 2024--6.2% --will hold off insulin till po intake improves  Hypokalemia -- replace potassium  Hypertension  --resume atenolol and amlodipine  Patient will discharge to [facility] healthcare tomorrow if remains stable. [name] aware.    Procedures: right chest tube Family communication :dter Tami Cummings Consults : none CODE STATUS: full DVT Prophylaxis : eliquis Level of care: Telemetry Medical Status is: Inpatient Remains inpatient appropriate because: respiratory failure    TOTAL TIME TAKING CARE OF THIS PATIENT: 35 minutes.  >50% time spent on counselling and coordination of care  Note: This dictation was prepared with Dragon dictation along with smaller phrase technology. Any transcriptional errors that result from this process are unintentional.  Enedina Finner M.D    [facility]   CC: Primary care physician; Larae Grooms, NP

## 2023-10-23 NOTE — Progress Notes (Addendum)
Daily Progress Note   Patient Name: Tami Cummings       Date: 10/23/2023 DOB: 1946/03/04  Age: 77 y.o. MRN#: [id number] Attending Physician: Enedina Finner, MD Primary Care Physician: Larae Grooms, NP Admit Date: 10/11/2023  Reason for Consultation/Follow-up: Establishing goals of care  Subjective: Notes and labs reviewed.  In to see patient.  She is sitting in bed stating that she is getting ready to go.  Nursing advises patient is being moved out of ICU.  No family at bedside.  As per notes patient will be discharging to [facility].  PMT will sign off. Please reconusult if needs arise.   Length of Stay: 12  Current Medications: Scheduled Meds:   amLODipine  10 mg Oral Daily   apixaban  10 mg Oral BID   Followed by   Melene Muller ON 10/29/2023] apixaban  5 mg Oral BID   atenolol  100 mg Oral Daily   atorvastatin  10 mg Oral Daily   Chlorhexidine Gluconate Cloth  6 each Topical Daily   docusate sodium  100 mg Oral BID   donepezil  5 mg Oral QHS   feeding supplement  237 mL Oral TID BM   folic acid  500 mcg Oral Daily   furosemide  40 mg Oral Daily   gabapentin  300 mg Oral TID   insulin aspart  0-5 Units Subcutaneous QHS   insulin aspart  0-9 Units Subcutaneous TID WC   ipratropium-albuterol  3 mL Nebulization BID   lidocaine  1 patch Transdermal Q24H   melatonin  2.5 mg Oral QHS   methocarbamol  750 mg Oral QID   multivitamin with minerals  1 tablet Oral Daily   sertraline  25 mg Oral QHS   spironolactone  25 mg Oral Daily    Continuous Infusions:   PRN Meds: acetaminophen, dextrose, hydrALAZINE, ondansetron (ZOFRAN) IV, ondansetron, mouth rinse, sodium chloride flush  Physical Exam Pulmonary:     Effort: Pulmonary effort is normal.     Comments:  Rhonchi noted with speaking, standing at bedside. Skin:    General: Skin is warm and dry.  Neurological:     Mental Status: She is alert.             Vital Signs: BP (!) 141/61   Pulse (!) 103  Temp 97.7 F (36.5 C) (Oral)   Resp 18   Ht 5\' 1"  (1.549 m)   Wt 61.8 kg   SpO2 98%   BMI 25.74 kg/m  SpO2: SpO2: 98 % O2 Device: O2 Device: Nasal Cannula O2 Flow Rate: O2 Flow Rate (L/min): 2 L/min  Intake/output summary:  Intake/Output Summary (Last 24 hours) at 10/23/2023 1517 Last data filed at 10/23/2023 1300 Gross per 24 hour  Intake 589.9 ml  Output 750 ml  Net -160.1 ml   LBM: Last BM Date : 10/22/23 Baseline Weight: Weight: 78 kg Most recent weight: Weight: 61.8 kg         Patient Active Problem List   Diagnosis Date Noted   Acute respiratory failure with hypoxia (HCC) 10/19/2023   Pleural effusion 10/18/2023   Acute on chronic diastolic congestive heart failure (HCC) 10/18/2023   Acute on chronic respiratory failure with hypoxia and hypercapnia (HCC) 10/17/2023   HAP (hospital-acquired pneumonia) 10/17/2023   Acute deep vein thrombosis (DVT) of calf muscle vein (HCC) 10/17/2023   Hypoxia 10/11/2023   Open T11 vertebral fracture (HCC) 07/23/2023   Acute midline thoracic back pain 07/20/2023   Adjustment disorder with anxious mood 06/29/2023   Mild dementia without behavioral disturbance, psychotic disturbance, mood disturbance, or anxiety (HCC) 01/23/2023   Osteopenia 01/01/2021   Vitamin D deficiency 10/18/2020   Vitamin B12 deficiency 10/18/2020   CKD (chronic kidney disease) stage 3, GFR 30-59 ml/min (HCC) 10/08/2020   Senile purpura (HCC) 04/10/2017   Morbid obesity (HCC) 04/10/2017   Advance care planning 04/10/2017   Noncompliance 10/30/2016   DM type 2 with diabetic peripheral neuropathy (HCC)    Hyperlipidemia associated with type 2 diabetes mellitus (HCC)    Hypertension associated with diabetes [facility])     Palliative Care Assessment & Plan     Recommendations/Plan: As per notes patient will be discharging to [facility].  PMT will sign off. Please reconsult if needs arise.  Code Status:    Code Status Orders  (From admission, onward)           Start     Ordered   10/19/23 1533  Full code  (Code Status)  Continuous       Question:  By:  Answer:  Consent: discussion documented in EHR   10/19/23 1533           Code Status History     Date Active Date Inactive Code Status Order ID Comments User Context   10/19/2023 1453 10/19/2023 1533 Limited: Do not attempt resuscitation (DNR) -DNR-LIMITED -Do Not Intubate/DNI  [id number]  Judithe Modest, NP Inpatient   10/11/2023 1810 10/19/2023 1453 Full Code [id number]  Darlin Priestly, MD ED        Thank you for allowing the Palliative Medicine Team to assist in the care of this patient.   Morton Stall, NP  Please contact Palliative Medicine Team phone at [phone] for questions and concerns.

## 2023-10-24 DIAGNOSIS — R0902 Hypoxemia: Secondary | ICD-10-CM | POA: Diagnosis not present

## 2023-10-24 LAB — GLUCOSE, CAPILLARY
Glucose-Capillary: 121 mg/dL — ABNORMAL HIGH (ref 70–99)
Glucose-Capillary: 133 mg/dL — ABNORMAL HIGH (ref 70–99)
Glucose-Capillary: 189 mg/dL — ABNORMAL HIGH (ref 70–99)
Glucose-Capillary: 202 mg/dL — ABNORMAL HIGH (ref 70–99)
Glucose-Capillary: 224 mg/dL — ABNORMAL HIGH (ref 70–99)

## 2023-10-24 LAB — BASIC METABOLIC PANEL
Anion gap: 8 (ref 5–15)
BUN: 19 mg/dL (ref 8–23)
CO2: 34 mmol/L — ABNORMAL HIGH (ref 22–32)
Calcium: 8 mg/dL — ABNORMAL LOW (ref 8.9–10.3)
Chloride: 99 mmol/L (ref 98–111)
Creatinine, Ser: 0.48 mg/dL (ref 0.44–1.00)
GFR, Estimated: >60 mL/min (ref >60)
Glucose, Bld: 141 mg/dL — ABNORMAL HIGH (ref 70–99)
Potassium: 3.2 mmol/L — ABNORMAL LOW (ref 3.5–5.1)
Sodium: 141 mmol/L (ref 135–145)

## 2023-10-24 LAB — CHOLESTEROL, BODY FLUID: Cholesterol, Fluid: 42 mg/dL

## 2023-10-24 LAB — MAGNESIUM: Magnesium: 2 mg/dL (ref 1.7–2.4)

## 2023-10-24 LAB — PHOSPHORUS: Phosphorus: 3.4 mg/dL (ref 2.5–4.6)

## 2023-10-24 MED ORDER — POTASSIUM CHLORIDE CRYS ER 20 MEQ PO TBCR
40.0000 meq | EXTENDED_RELEASE_TABLET | Freq: Once | ORAL | Status: AC
  Administered 2023-10-24: 40 meq via ORAL
  Filled 2023-10-24: qty 2

## 2023-10-24 NOTE — Care Management Important Message (Signed)
Important Message  Patient Details  Name: Tami Cummings MRN: [id number] Date of Birth: 12-May-1946   Important Message Given:  Yes - Medicare IM     Verita Schneiders Tariq Pernell 10/24/2023, 8:43 AM

## 2023-10-24 NOTE — TOC Progression Note (Signed)
Transition of Care [facility]) - Progression Note    Patient Details  Name: Tami Cummings MRN: [id number] Date of Birth: 1945/12/29  Transition of Care [facility]) CM/SW Contact  Allena Katz, LCSW Phone Number: 10/24/2023, 10:48 AM  Clinical Narrative:   Berkley Harvey started for [facility]. Per tanya with [facility] pt was able to walk 72ft with assist prior to admission.     Expected Discharge Plan: Skilled Nursing Facility    Expected Discharge Plan and Services                                               Social Determinants of Health (SDOH) Interventions SDOH Screenings   Food Insecurity: No Food Insecurity (10/12/2023)  Housing: Low Risk  (10/12/2023)  Transportation Needs: No Transportation Needs (10/12/2023)  Utilities: Not At Risk (10/12/2023)  Alcohol Screen: Low Risk  (08/08/2022)  Depression (PHQ2-9): Low Risk  (05/07/2023)  Financial Resource Strain: Low Risk  (09/03/2023)   Received from [facility]  Physical Activity: Inactive (08/08/2022)  Social Connections: Unknown (08/08/2022)  Recent Concern: Social Connections - Socially Isolated (08/08/2022)  Stress: No Stress Concern Present (08/08/2022)  Tobacco Use: Medium Risk (10/12/2023)    Readmission Risk Interventions    10/17/2023   12:04 PM  Readmission Risk Prevention Plan  Transportation Screening Complete  Home Care Screening --  Medication Review (RN CM) Complete

## 2023-10-24 NOTE — Progress Notes (Signed)
[facility]  - [place] at [facility]   PATIENT NAME: Tami Cummings    MR#:  [id number]  DATE OF BIRTH:  1946/09/10  SUBJECTIVE:  No complaints, reports breathing stable   VITALS:  Blood pressure (!) 112/45, pulse 69, temperature 98.2 F (36.8 C), temperature source Oral, resp. rate 18, height 5\' 1"  (1.549 m), weight 61.8 kg, SpO2 97%.  PHYSICAL EXAMINATION:   GENERAL:  77 y.o.-year-old patient with no acute distress. Appears chronically ill LUNGS: decreased breath sounds bilaterally, clear CARDIOVASCULAR: S1, S2 normal. No murmur   ABDOMEN: Soft, nontender, nondistended.  EXTREMITIES: No  edema b/l.    NEUROLOGIC: nonfocal  patient is alert and awake SKIN: no visible lesions  LABORATORY PANEL:  CBC Recent Labs  Lab 10/22/23 0335  WBC 6.6  HGB 10.8*  HCT 35.6*  PLT 85*    Chemistries  Recent Labs  Lab 10/24/23 0354  NA 141  K 3.2*  CL 99  CO2 34*  GLUCOSE 141*  BUN 19  CREATININE 0.48  CALCIUM 8.0*  MG 2.0   Assessment and Plan Tami Cummings is a 77 y.o. female with medical history significant of DM2, dementia who presented from [facility] for concern for possible PNA.   PER [facility] notes--The patient was recently hospitalized (08/31/23 - 09/10/23) for wound dehiscence after back surgery of T9-T12 fusion (done 07/30/23) following an injury that occurred in July of this year. After a washout she was discharged to a SNF with a PICC line for Vancomycin and Rocephin scheduled to d/c abx 10/11/23.   10/11/23: admitted to hospital service and treated for acute hypoxic respiratory failure 10/25: Agitated, trying to get OOB 10/26: Confused, agitated, trying to get OOB.  Required Haldol and Zyprexa. 10/27: Confused, refusing medications 10/28: excessive secretions in upper airway requiring suctioning 10/29: Transferred to the ICU due to increased work of breathing and O2 desaturation requiring mechanical ventilation and vasopressor  support 10/30: Pt remains mechanically intubated vent settings improving: FiO2 45%/PEEP 10.  Requiring levophed gtt @5  mcg/min and vasopressin @0 .04 units/min.  Right-sided chest tube placed due to large pleural effusion  10/31: Pt no longer requiring vasopressors and maintain map 65 or higher.  During WUA pts following commands.  Performed SBT, however became tachypneic with accessory muscle use and hypoxic placed back on full support.  Will diurese with 60 mg iv lasix x1 dose.  Will reattempt SBT following diuresis  11/1: On minimal vent support, Diurese again with 60 mg IV Lasix x1 dose then perform SBT as tolerated.  With worsening thrombocytopenia, start Argatroban while ruling out HIT and DIC.  In the afternoon family requests 1 way extubation with transition to Comfort Measures should she not tolerate extubation 11/2: comfortable on hi-flo nasal cannula 11/4-- assumed care of pt from today. On 2 L nasal count oxygen. Breathing comfortably. I will transfer out of ICU. No family at bedside. Will have PT OT see patient. Patient's pigtail catheter was removed yesterday by pulmonary. 11/5-- overall doing well. Some cough. If remains stable patient will discharge to [facility] tomorrow. Discussed with daughter Baldo Ash on the phone  Acute on chronic hypoxic hypercapnic respiratory failure bilateral pleural effusion status post chest tube placement on the right side now removed bilateral pneumonia -- patient currently on 2 L nasal cannula oxygen -- completed seven days of antibiotics -- Incentive spirometer, flutter valve  Acute decompensated heart failure with preserved ejection fraction -- patient diuresed. Creatinine stable -- continue  Lasix 40 mg  qd  Acute metabolic encephalopathy in the setting of infection advanced dementia -- mentation appears to be nearing baseline  History of DVT With right calf dvt on PVL 10/30 -- continue apixaban  Type II diabetes with mild  hyperglycemia, hyperlipidemia -- sliding scale insulin for now -- continue statins --A1c oct 2024--6.2%  Hypokalemia mild -- replace potassium  Hypertension  --resumed atenolol and amlodipine  Debility PT advising snf, auth is pending       Procedures: right chest tube, now discontinued Family communication : none at bedside. No answer when daughter called today. Consults : none CODE STATUS: full DVT Prophylaxis : eliquis Level of care: Telemetry Medical Status is: Inpatient Remains inpatient appropriate because: pending insurance auth for snf    TOTAL TIME TAKING CARE OF THIS PATIENT: 35 minutes.     Silvano Bilis M.D    [facility]

## 2023-10-24 NOTE — Progress Notes (Signed)
Per Shonna Chock, MD okay to discontinue telemetry orders and continuous pulse oximetry.

## 2023-10-24 NOTE — [place] FL2 (Signed)
[place] MEDICAID FL2 LEVEL OF CARE FORM     IDENTIFICATION  Patient Name: Tami Cummings Birthdate: Nov 03, 1946 Sex: female Admission Date (Current Location): 10/11/2023  [place] and [place] Number:  Chiropodist and Address:  [facility], [address], [place], [place] [address]      Provider Number: [id number]  Attending Physician Name and Address:  Kathrynn Running, MD  Relative Name and Phone Number:       Current Level of Care: Hospital Recommended Level of Care: Skilled Nursing Facility Prior Approval Number:    Date Approved/Denied:   PASRR Number: [id number]  Discharge Plan: SNF    Current Diagnoses: Patient Active Problem List   Diagnosis Date Noted   Acute respiratory failure with hypoxia (HCC) 10/19/2023   Pleural effusion 10/18/2023   Acute on chronic diastolic congestive heart failure (HCC) 10/18/2023   Acute on chronic respiratory failure with hypoxia and hypercapnia (HCC) 10/17/2023   HAP (hospital-acquired pneumonia) 10/17/2023   Acute deep vein thrombosis (DVT) of calf muscle vein (HCC) 10/17/2023   Hypoxia 10/11/2023   Open T11 vertebral fracture (HCC) 07/23/2023   Acute midline thoracic back pain 07/20/2023   Adjustment disorder with anxious mood 06/29/2023   Mild dementia without behavioral disturbance, psychotic disturbance, mood disturbance, or anxiety (HCC) 01/23/2023   Osteopenia 01/01/2021   Vitamin D deficiency 10/18/2020   Vitamin B12 deficiency 10/18/2020   CKD (chronic kidney disease) stage 3, GFR 30-59 ml/min (HCC) 10/08/2020   Senile purpura (HCC) 04/10/2017   Morbid obesity (HCC) 04/10/2017   Advance care planning 04/10/2017   Noncompliance 10/30/2016   DM type 2 with diabetic peripheral neuropathy (HCC)    Hyperlipidemia associated with type 2 diabetes mellitus (HCC)    Hypertension associated with diabetes (HCC)     Orientation RESPIRATION BLADDER Height & Weight     Self,  Place  Normal Continent Weight: 136 lb 3.9 oz (61.8 kg) Height:  5\' 1"  (154.9 cm)  BEHAVIORAL SYMPTOMS/MOOD NEUROLOGICAL BOWEL NUTRITION STATUS      Incontinent    AMBULATORY STATUS COMMUNICATION OF NEEDS Skin   Extensive Assist Verbally  (PI on coccyx)                       Personal Care Assistance Level of Assistance  Bathing, Dressing, Feeding Bathing Assistance: Maximum assistance Feeding assistance: Maximum assistance Dressing Assistance: Maximum assistance     Functional Limitations Info  Sight, Hearing, Speech Sight Info: Impaired Hearing Info: Adequate Speech Info: Adequate    SPECIAL CARE FACTORS FREQUENCY                       Contractures Contractures Info: Not present    Additional Factors Info  Code Status, Allergies Code Status Info: DNR Allergies Info: Empagliflozin  Actos (Pioglitazone)  Amlodipine  Benazepril  Hydrochlorothiazide  Metformin And Related           Current Medications (10/24/2023):  This is the current hospital active medication list Current Facility-Administered Medications  Medication Dose Route Frequency Provider Last Rate Last Admin   acetaminophen (TYLENOL) tablet 650 mg  650 mg Oral Q6H PRN Enedina Finner, MD       amLODipine (NORVASC) tablet 10 mg  10 mg Oral Daily Enedina Finner, MD   10 mg at 10/23/23 0945   apixaban (ELIQUIS) tablet 10 mg  10 mg Oral BID Enedina Finner, MD   10 mg at 10/24/23 2706   Followed  by   Melene Muller ON 10/29/2023] apixaban (ELIQUIS) tablet 5 mg  5 mg Oral BID Enedina Finner, MD       atenolol (TENORMIN) tablet 100 mg  100 mg Oral Daily Enedina Finner, MD   100 mg at 10/24/23 0936   atorvastatin (LIPITOR) tablet 10 mg  10 mg Oral Daily Enedina Finner, MD   10 mg at 10/24/23 4098   Chlorhexidine Gluconate Cloth 2 % PADS 6 each  6 each Topical Daily Darlin Priestly, MD   6 each at 10/24/23 0939   dextrose 50 % solution 50 mL  1 ampule Intravenous PRN Raechel Chute, MD       docusate sodium (COLACE) capsule 100 mg  100 mg  Oral BID Coulter, Eber Jones, RPH   100 mg at 10/24/23 0937   donepezil (ARICEPT) tablet 5 mg  5 mg Oral QHS Lowella Bandy, RPH   5 mg at 10/23/23 2147   feeding supplement (ENSURE ENLIVE / ENSURE PLUS) liquid 237 mL  237 mL Oral TID BM Enedina Finner, MD   237 mL at 10/24/23 0939   folic acid (FOLVITE) tablet 0.5 mg  500 mcg Oral Daily Enedina Finner, MD   0.5 mg at 10/24/23 1191   furosemide (LASIX) tablet 40 mg  40 mg Oral Daily Enedina Finner, MD   40 mg at 10/24/23 4782   gabapentin (NEURONTIN) capsule 300 mg  300 mg Oral TID Enedina Finner, MD   300 mg at 10/24/23 0936   hydrALAZINE (APRESOLINE) injection 10 mg  10 mg Intravenous Q4H PRN Raechel Chute, MD   10 mg at 10/22/23 1958   insulin aspart (novoLOG) injection 0-5 Units  0-5 Units Subcutaneous QHS Enedina Finner, MD       insulin aspart (novoLOG) injection 0-9 Units  0-9 Units Subcutaneous TID WC Enedina Finner, MD   1 Units at 10/24/23 0808   ipratropium-albuterol (DUONEB) 0.5-2.5 (3) MG/3ML nebulizer solution 3 mL  3 mL Nebulization BID Enedina Finner, MD   3 mL at 10/24/23 0736   lidocaine (LIDODERM) 5 % 1 patch  1 patch Transdermal Q24H [name] D, NP   1 patch at 10/23/23 2015   melatonin tablet 2.5 mg  2.5 mg Oral QHS Dgayli, Lianne Bushy, MD   2.5 mg at 10/23/23 2148   methocarbamol (ROBAXIN) tablet 750 mg  750 mg Oral QID Enedina Finner, MD   750 mg at 10/24/23 0936   multivitamin with minerals tablet 1 tablet  1 tablet Oral Daily Celene Squibb, RPH   1 tablet at 10/24/23 0937   ondansetron (ZOFRAN) injection 4 mg  4 mg Intravenous Q6H PRN Darlin Priestly, MD   4 mg at 10/20/23 0015   ondansetron (ZOFRAN-ODT) disintegrating tablet 4 mg  4 mg Oral Q8H PRN Darlin Priestly, MD       Oral care mouth rinse  15 mL Mouth Rinse PRN Erin Fulling, MD   15 mL at 10/22/23 0759   sertraline (ZOLOFT) tablet 25 mg  25 mg Oral QHS Lowella Bandy, RPH   25 mg at 10/23/23 2147   sodium chloride flush (NS) 0.9 % injection 10-40 mL  10-40 mL Intracatheter PRN Erin Fulling, MD        spironolactone (ALDACTONE) tablet 25 mg  25 mg Oral Daily Enedina Finner, MD   25 mg at 10/24/23 9562     Discharge Medications: Please see discharge summary for a list of discharge medications.  Relevant Imaging Results:  Relevant Lab Results:   Additional Information  SS- [id number]  Allena Katz, LCSW

## 2023-10-25 DIAGNOSIS — R0902 Hypoxemia: Secondary | ICD-10-CM | POA: Diagnosis not present

## 2023-10-25 LAB — BASIC METABOLIC PANEL
Anion gap: 6 (ref 5–15)
BUN: 20 mg/dL (ref 8–23)
CO2: 34 mmol/L — ABNORMAL HIGH (ref 22–32)
Calcium: 7.7 mg/dL — ABNORMAL LOW (ref 8.9–10.3)
Chloride: 99 mmol/L (ref 98–111)
Creatinine, Ser: 0.44 mg/dL (ref 0.44–1.00)
GFR, Estimated: >60 mL/min (ref >60)
Glucose, Bld: 124 mg/dL — ABNORMAL HIGH (ref 70–99)
Potassium: 3.9 mmol/L (ref 3.5–5.1)
Sodium: 139 mmol/L (ref 135–145)

## 2023-10-25 LAB — GLUCOSE, CAPILLARY
Glucose-Capillary: 197 mg/dL — ABNORMAL HIGH (ref 70–99)
Glucose-Capillary: 132 mg/dL — ABNORMAL HIGH (ref 70–99)
Glucose-Capillary: 147 mg/dL — ABNORMAL HIGH (ref 70–99)
Glucose-Capillary: 246 mg/dL — ABNORMAL HIGH (ref 70–99)
Glucose-Capillary: 211 mg/dL — ABNORMAL HIGH (ref 70–99)
Glucose-Capillary: 119 mg/dL — ABNORMAL HIGH (ref 70–99)

## 2023-10-25 MED ORDER — IPRATROPIUM-ALBUTEROL 0.5-2.5 (3) MG/3ML IN SOLN: 3.0000 mL | Freq: Four times a day (QID) | RESPIRATORY_TRACT | Status: DC | PRN

## 2023-10-25 MED ORDER — ATENOLOL 50 MG PO TABS
50.0000 mg | ORAL_TABLET | Freq: Every day | ORAL | Status: DC
  Administered 2023-10-26: 50 mg via ORAL
  Filled 2023-10-25: qty 1

## 2023-10-25 NOTE — Progress Notes (Signed)
[facility]  - [place] at [facility]   PATIENT NAME: Tami Cummings    MR#:  [id number]  DATE OF BIRTH:  1946-05-29  SUBJECTIVE:  No complaints, reports breathing stable   VITALS:  Blood pressure (!) 113/44, pulse 74, temperature (!) 97.5 F (36.4 C), resp. rate 20, height 5\' 1"  (1.549 m), weight 61.8 kg, SpO2 92%.  PHYSICAL EXAMINATION:   GENERAL:  77 y.o.-year-old patient with no acute distress. Appears chronically ill LUNGS: decreased breath sounds bilaterally, clear CARDIOVASCULAR: S1, S2 normal. No murmur   ABDOMEN: Soft, nontender, nondistended.  EXTREMITIES: No  edema b/l.    NEUROLOGIC: nonfocal  patient is alert and awake SKIN: no visible lesions  LABORATORY PANEL:  CBC Recent Labs  Lab 10/22/23 0335  WBC 6.6  HGB 10.8*  HCT 35.6*  PLT 85*    Chemistries  Recent Labs  Lab 10/24/23 0354 10/25/23 0453  NA 141 139  K 3.2* 3.9  CL 99 99  CO2 34* 34*  GLUCOSE 141* 124*  BUN 19 20  CREATININE 0.48 0.44  CALCIUM 8.0* 7.7*  MG 2.0  --    Assessment and Plan Tami Cummings is a 77 y.o. female with medical history significant of DM2, dementia who presented from [facility] for concern for possible PNA.   PER [facility] notes--The patient was recently hospitalized (08/31/23 - 09/10/23) for wound dehiscence after back surgery of T9-T12 fusion (done 07/30/23) following an injury that occurred in July of this year. After a washout she was discharged to a SNF with a PICC line for Vancomycin and Rocephin scheduled to d/c abx 10/11/23.   10/11/23: admitted to hospital service and treated for acute hypoxic respiratory failure 10/25: Agitated, trying to get OOB 10/26: Confused, agitated, trying to get OOB.  Required Haldol and Zyprexa. 10/27: Confused, refusing medications 10/28: excessive secretions in upper airway requiring suctioning 10/29: Transferred to the ICU due to increased work of breathing and O2 desaturation requiring mechanical  ventilation and vasopressor support 10/30: Pt remains mechanically intubated vent settings improving: FiO2 45%/PEEP 10.  Requiring levophed gtt @5  mcg/min and vasopressin @0 .04 units/min.  Right-sided chest tube placed due to large pleural effusion  10/31: Pt no longer requiring vasopressors and maintain map 65 or higher.  During WUA pts following commands.  Performed SBT, however became tachypneic with accessory muscle use and hypoxic placed back on full support.  Will diurese with 60 mg iv lasix x1 dose.  Will reattempt SBT following diuresis  11/1: On minimal vent support, Diurese again with 60 mg IV Lasix x1 dose then perform SBT as tolerated.  With worsening thrombocytopenia, start Argatroban while ruling out HIT and DIC.  In the afternoon family requests 1 way extubation with transition to Comfort Measures should she not tolerate extubation 11/2: comfortable on hi-flo nasal cannula 11/4-- assumed care of pt from today. On 2 L nasal count oxygen. Breathing comfortably. I will transfer out of ICU. No family at bedside. Will have PT OT see patient. Patient's pigtail catheter was removed yesterday by pulmonary. 11/5-- overall doing well. Some cough. If remains stable patient will discharge to [facility] healthcare tomorrow. Discussed with daughter Baldo Ash on the phone  Acute on chronic hypoxic hypercapnic respiratory failure bilateral pleural effusion status post chest tube placement on the right side now removed bilateral pneumonia -- patient currently on 2 L nasal cannula oxygen -- completed seven days of antibiotics -- Incentive spirometer, flutter valve  Acute decompensated heart failure with preserved ejection fraction --  patient diuresed. 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 No answer when daughter called yesterday or today. Consults : none CODE STATUS: full DVT Prophylaxis : eliquis Level of care: Telemetry Medical Status is: Inpatient Remains inpatient appropriate because: pending insurance auth for snf    TOTAL TIME TAKING CARE OF THIS PATIENT: 35 minutes.     Silvano Bilis M.D    [facility]

## 2023-10-25 NOTE — Discharge Instructions (Signed)
Information on my medicine - ELIQUIS (apixaban)  This medication education was reviewed with me or my healthcare representative as part of my discharge preparation.   Why was Eliquis prescribed for you? Eliquis was prescribed to treat blood clots that may have been found in the veins of your legs (deep vein thrombosis) or in your lungs (pulmonary embolism) and to reduce the risk of them occurring again.  What do You need to know about Eliquis ? The starting dose is 10 mg (two 5 mg tablets) taken TWICE daily for the FIRST SEVEN (7) DAYS, then on (enter date)  10/29/23  the dose is reduced to ONE 5 mg tablet taken TWICE daily.  Eliquis may be taken with or without food.   Try to take the dose about the same time in the morning and in the evening. If you have difficulty swallowing the tablet whole please discuss with your pharmacist how to take the medication safely.  Take Eliquis exactly as prescribed and DO NOT stop taking Eliquis without talking to the doctor who prescribed the medication.  Stopping may increase your risk of developing a new blood clot.  Refill your prescription before you run out.  After discharge, you should have regular check-up appointments with your healthcare provider that is prescribing your Eliquis.    What do you do if you miss a dose? If a dose of ELIQUIS is not taken at the scheduled time, take it as soon as possible on the same day and twice-daily administration should be resumed. The dose should not be doubled to make up for a missed dose.  Important Safety Information A possible side effect of Eliquis is bleeding. You should call your healthcare provider right away if you experience any of the following: Bleeding from an injury or your nose that does not stop. Unusual colored urine (red or dark brown) or unusual colored stools (red or black). Unusual bruising for unknown reasons. A serious fall or if you hit your head (even if there is no  bleeding).  Some medicines may interact with Eliquis and might increase your risk of bleeding or clotting while on Eliquis. To help avoid this, consult your healthcare provider or pharmacist prior to using any new prescription or non-prescription medications, including herbals, vitamins, non-steroidal anti-inflammatory drugs (NSAIDs) and supplements.  This website has more information on Eliquis (apixaban): http://www.eliquis.com/eliquis/home

## 2023-10-25 NOTE — Plan of Care (Signed)
Pt's cognitive status makes education retention difficult.   Problem: Education: Goal: Ability to describe self-care measures that may prevent or decrease complications (Diabetes Survival Skills Education) will improve Outcome: Not Progressing   Problem: Education: Goal: Knowledge of General Education information will improve Description: Including pain rating scale, medication(s)/side effects and non-pharmacologic comfort measures Outcome: Not Progressing  ----------------------------------------------------------------------------- Problem: Coping: Goal: Ability to adjust to condition or change in health will improve Outcome: Progressing   Problem: Fluid Volume: Goal: Ability to maintain a balanced intake and output will improve Outcome: Progressing   Problem: Metabolic: Goal: Ability to maintain appropriate glucose levels will improve Outcome: Progressing   Problem: Nutritional: Goal: Maintenance of adequate nutrition will improve Outcome: Progressing Goal: Progress toward achieving an optimal weight will improve Outcome: Progressing   Problem: Skin Integrity: Goal: Risk for impaired skin integrity will decrease Outcome: Progressing   Problem: Tissue Perfusion: Goal: Adequacy of tissue perfusion will improve Outcome: Progressing   Problem: Health Behavior/Discharge Planning: Goal: Ability to manage health-related needs will improve Outcome: Progressing   Problem: Clinical Measurements: Goal: Ability to maintain clinical measurements within normal limits will improve Outcome: Progressing Goal: Will remain free from infection Outcome: Progressing Goal: Diagnostic test results will improve Outcome: Progressing Goal: Respiratory complications will improve Outcome: Progressing Goal: Cardiovascular complication will be avoided Outcome: Progressing   Problem: Activity: Goal: Risk for activity intolerance will decrease Outcome: Progressing   Problem:  Nutrition: Goal: Adequate nutrition will be maintained Outcome: Progressing   Problem: Coping: Goal: Level of anxiety will decrease Outcome: Progressing   Problem: Elimination: Goal: Will not experience complications related to bowel motility Outcome: Progressing Goal: Will not experience complications related to urinary retention Outcome: Progressing   Problem: Pain Management: Goal: General experience of comfort will improve Outcome: Progressing   Problem: Safety: Goal: Ability to remain free from injury will improve Outcome: Progressing   Problem: Skin Integrity: Goal: Risk for impaired skin integrity will decrease Outcome: Progressing   Problem: Activity: Goal: Ability to tolerate increased activity will improve Outcome: Progressing   Problem: Respiratory: Goal: Ability to maintain adequate ventilation will improve Outcome: Progressing Goal: Ability to maintain a clear airway will improve Outcome: Progressing

## 2023-10-25 NOTE — TOC Progression Note (Signed)
Transition of Care [facility]) - Progression Note    Patient Details  Name: Tami Cummings MRN: [id number] Date of Birth: [birth year]  Transition of Care [facility]) CM/SW Contact  Allena Katz, LCSW Phone Number: 10/25/2023, 11:19 AM  Clinical Narrative:   [facility] still pending.     Expected Discharge Plan: Skilled Nursing Facility    Expected Discharge Plan and Services                                               Social Determinants of Health (SDOH) Interventions SDOH Screenings   Food Insecurity: No Food Insecurity (10/12/2023)  Housing: Low Risk  (10/12/2023)  Transportation Needs: No Transportation Needs (10/12/2023)  Utilities: Not At Risk (10/12/2023)  Alcohol Screen: Low Risk  (08/08/2022)  Depression (PHQ2-9): Low Risk  (05/07/2023)  Financial Resource Strain: Low Risk  (09/03/2023)   Received from [facility]  Physical Activity: Inactive (08/08/2022)  Social Connections: Unknown (08/08/2022)  Recent Concern: Social Connections - Socially Isolated (08/08/2022)  Stress: No Stress Concern Present (08/08/2022)  Tobacco Use: Medium Risk (10/12/2023)    Readmission Risk Interventions    10/17/2023   12:04 PM  Readmission Risk Prevention Plan  Transportation Screening Complete  Home Care Screening --  Medication Review (RN CM) Complete

## 2023-10-25 NOTE — Plan of Care (Signed)
  Problem: Education: Goal: Ability to describe self-care measures that may prevent or decrease complications (Diabetes Survival Skills Education) will improve Outcome: Progressing   Problem: Coping: Goal: Ability to adjust to condition or change in health will improve Outcome: Progressing   Problem: Metabolic: Goal: Ability to maintain appropriate glucose levels will improve Outcome: Progressing   Problem: Nutritional: Goal: Maintenance of adequate nutrition will improve Outcome: Progressing   Problem: Skin Integrity: Goal: Risk for impaired skin integrity will decrease Outcome: Progressing   Problem: Tissue Perfusion: Goal: Adequacy of tissue perfusion will improve Outcome: Progressing   Problem: Activity: Goal: Risk for activity intolerance will decrease Outcome: Progressing   Problem: Nutrition: Goal: Adequate nutrition will be maintained Outcome: Progressing   Problem: Coping: Goal: Level of anxiety will decrease Outcome: Progressing

## 2023-10-26 DIAGNOSIS — R0902 Hypoxemia: Secondary | ICD-10-CM | POA: Diagnosis not present

## 2023-10-26 LAB — BASIC METABOLIC PANEL
Anion gap: 8 (ref 5–15)
BUN: 14 mg/dL (ref 8–23)
CO2: 33 mmol/L — ABNORMAL HIGH (ref 22–32)
Calcium: 8.2 mg/dL — ABNORMAL LOW (ref 8.9–10.3)
Chloride: 100 mmol/L (ref 98–111)
Creatinine, Ser: 0.46 mg/dL (ref 0.44–1.00)
GFR, Estimated: >60 mL/min (ref >60)
Glucose, Bld: 178 mg/dL — ABNORMAL HIGH (ref 70–99)
Potassium: 4 mmol/L (ref 3.5–5.1)
Sodium: 141 mmol/L (ref 135–145)

## 2023-10-26 LAB — GLUCOSE, CAPILLARY
Glucose-Capillary: 128 mg/dL — ABNORMAL HIGH (ref 70–99)
Glucose-Capillary: 141 mg/dL — ABNORMAL HIGH (ref 70–99)
Glucose-Capillary: 144 mg/dL — ABNORMAL HIGH (ref 70–99)
Glucose-Capillary: 149 mg/dL — ABNORMAL HIGH (ref 70–99)

## 2023-10-26 MED ORDER — APIXABAN 5 MG PO TABS: ORAL_TABLET | ORAL | Status: AC

## 2023-10-26 MED ORDER — ATENOLOL 50 MG PO TABS: 50.0000 mg | ORAL_TABLET | Freq: Every day | ORAL | Status: AC

## 2023-10-26 MED ORDER — FUROSEMIDE 40 MG PO TABS: 40.0000 mg | ORAL_TABLET | Freq: Every day | ORAL | Status: AC

## 2023-10-26 NOTE — TOC Transition Note (Signed)
Transition of Care [facility]) - CM/SW Discharge Note   Patient Details  Name: Tami Cummings MRN: [id number] Date of Birth: [birth year]  [facility]) CM/SW Contact:  Allena Katz, LCSW Phone Number: 10/26/2023, 12:09 PM   Clinical Narrative:   Pt has orders to discharge back to [place] health care. Rn give number for report. Dc summary sent to facility. CSW to call [facility].    Final next level of care: Skilled Nursing Facility Barriers to Discharge: Barriers Resolved   Patient Goals and CMS Choice      Discharge Placement                  Patient to be transferred to facility by: [facility]      Discharge Plan and Services Additional resources added to the After Visit Summary for                                       Social Determinants of Health (SDOH) Interventions SDOH Screenings   Food Insecurity: No Food Insecurity (10/12/2023)  Housing: Low Risk  (10/12/2023)  Transportation Needs: No Transportation Needs (10/12/2023)  Utilities: Not At Risk (10/12/2023)  Alcohol Screen: Low Risk  (08/08/2022)  Depression (PHQ2-9): Low Risk  (05/07/2023)  Financial Resource Strain: Low Risk  (09/03/2023)   Received from [facility]  Physical Activity: Inactive (08/08/2022)  Social Connections: Unknown (08/08/2022)  Recent Concern: Social Connections - Socially Isolated (08/08/2022)  Stress: No Stress Concern Present (08/08/2022)  Tobacco Use: Medium Risk (10/12/2023)     Readmission Risk Interventions    10/17/2023   12:04 PM  Readmission Risk Prevention Plan  Transportation Screening Complete  Home Care Screening --  Medication Review (RN CM) Complete

## 2023-10-26 NOTE — [place] FL2 (Signed)
[place] MEDICAID [id number] LEVEL OF CARE FORM     IDENTIFICATION  Patient Name: Tami Cummings Birthdate: 1946-06-07 Sex: female Admission Date (Current Location): 10/11/2023  [place] and [place] Number:  Chiropodist and Address:  [facility], [address], [place], [place] [address]      Provider Number: [id number]  Attending Physician Name and Address:  Kathrynn Running, MD  Relative Name and Phone Number:       Current Level of Care: Hospital Recommended Level of Care: Skilled Nursing Facility Prior Approval Number:    Date Approved/Denied:   PASRR Number: [id number]  Discharge Plan: SNF    Current Diagnoses: Patient Active Problem List   Diagnosis Date Noted   Acute respiratory failure with hypoxia (HCC) 10/19/2023   Pleural effusion 10/18/2023   Acute on chronic diastolic congestive heart failure (HCC) 10/18/2023   Acute on chronic respiratory failure with hypoxia and hypercapnia (HCC) 10/17/2023   HAP (hospital-acquired pneumonia) 10/17/2023   Acute deep vein thrombosis (DVT) of calf muscle vein (HCC) 10/17/2023   Hypoxia 10/11/2023   Open T11 vertebral fracture (HCC) 07/23/2023   Acute midline thoracic back pain 07/20/2023   Adjustment disorder with anxious mood 06/29/2023   Mild dementia without behavioral disturbance, psychotic disturbance, mood disturbance, or anxiety (HCC) 01/23/2023   Osteopenia 01/01/2021   Vitamin D deficiency 10/18/2020   Vitamin B12 deficiency 10/18/2020   CKD (chronic kidney disease) stage 3, GFR 30-59 ml/min (HCC) 10/08/2020   Senile purpura (HCC) 04/10/2017   Morbid obesity (HCC) 04/10/2017   Advance care planning 04/10/2017   Noncompliance 10/30/2016   DM type 2 with diabetic peripheral neuropathy (HCC)    Hyperlipidemia associated with type 2 diabetes mellitus (HCC)    Hypertension associated with diabetes (HCC)     Orientation RESPIRATION BLADDER Height & Weight     Self,  Place  Normal Continent Weight: 136 lb 3.9 oz (61.8 kg) Height:  5\' 1"  (154.9 cm)  BEHAVIORAL SYMPTOMS/MOOD NEUROLOGICAL BOWEL NUTRITION STATUS      Incontinent    AMBULATORY STATUS COMMUNICATION OF NEEDS Skin   Extensive Assist Verbally  (PI on coccyx)                       Personal Care Assistance Level of Assistance  Bathing, Dressing, Feeding Bathing Assistance: Maximum assistance Feeding assistance: Maximum assistance Dressing Assistance: Maximum assistance     Functional Limitations Info  Sight, Hearing, Speech Sight Info: Impaired Hearing Info: Adequate Speech Info: Adequate    SPECIAL CARE FACTORS FREQUENCY                       Contractures Contractures Info: Not present    Additional Factors Info  Code Status, Allergies Code Status Info: DNR Allergies Info: Empagliflozin  Actos (Pioglitazone)  Amlodipine  Benazepril  Hydrochlorothiazide  Metformin And Related           Current Medications (10/26/2023):  This is the current hospital active medication list Current Facility-Administered Medications  Medication Dose Route Frequency Provider Last Rate Last Admin   acetaminophen (TYLENOL) tablet 650 mg  650 mg Oral Q6H PRN Enedina Finner, MD       apixaban Everlene Balls) tablet 10 mg  10 mg Oral BID Enedina Finner, MD   10 mg at 10/26/23 2841   Followed by   Melene Muller ON 10/29/2023] apixaban (ELIQUIS) tablet 5 mg  5 mg Oral BID Enedina Finner, MD  atenolol (TENORMIN) tablet 50 mg  50 mg Oral Daily Kathrynn Running, MD   50 mg at 10/26/23 6578   atorvastatin (LIPITOR) tablet 10 mg  10 mg Oral Daily Enedina Finner, MD   10 mg at 10/26/23 4696   Chlorhexidine Gluconate Cloth 2 % PADS 6 each  6 each Topical Daily Darlin Priestly, MD   6 each at 10/26/23 1010   dextrose 50 % solution 50 mL  1 ampule Intravenous PRN Raechel Chute, MD       docusate sodium (COLACE) capsule 100 mg  100 mg Oral BID Coulter, Eber Jones, RPH   100 mg at 10/26/23 0953   donepezil (ARICEPT) tablet 5 mg   5 mg Oral QHS Lowella Bandy, RPH   5 mg at 10/25/23 2126   feeding supplement (ENSURE ENLIVE / ENSURE PLUS) liquid 237 mL  237 mL Oral TID BM Enedina Finner, MD   237 mL at 10/25/23 2000   folic acid (FOLVITE) tablet 0.5 mg  500 mcg Oral Daily Enedina Finner, MD   0.5 mg at 10/26/23 0952   furosemide (LASIX) tablet 40 mg  40 mg Oral Daily Enedina Finner, MD   40 mg at 10/26/23 0953   gabapentin (NEURONTIN) capsule 300 mg  300 mg Oral TID Enedina Finner, MD   300 mg at 10/26/23 0954   hydrALAZINE (APRESOLINE) injection 10 mg  10 mg Intravenous Q4H PRN Raechel Chute, MD   10 mg at 10/22/23 1958   insulin aspart (novoLOG) injection 0-5 Units  0-5 Units Subcutaneous QHS Enedina Finner, MD   3 Units at 10/24/23 2248   insulin aspart (novoLOG) injection 0-9 Units  0-9 Units Subcutaneous TID WC Enedina Finner, MD   1 Units at 10/26/23 0954   ipratropium-albuterol (DUONEB) 0.5-2.5 (3) MG/3ML nebulizer solution 3 mL  3 mL Nebulization Q6H PRN Enedina Finner, MD       lidocaine (LIDODERM) 5 % 1 patch  1 patch Transdermal Q24H [name] D, NP   1 patch at 10/25/23 2000   melatonin tablet 2.5 mg  2.5 mg Oral QHS Dgayli, Lianne Bushy, MD   2.5 mg at 10/25/23 2125   methocarbamol (ROBAXIN) tablet 750 mg  750 mg Oral QID Enedina Finner, MD   750 mg at 10/26/23 0953   multivitamin with minerals tablet 1 tablet  1 tablet Oral Daily Celene Squibb, RPH   1 tablet at 10/26/23 0953   ondansetron (ZOFRAN) injection 4 mg  4 mg Intravenous Q6H PRN Darlin Priestly, MD   4 mg at 10/20/23 0015   ondansetron (ZOFRAN-ODT) disintegrating tablet 4 mg  4 mg Oral Q8H PRN Darlin Priestly, MD       Oral care mouth rinse  15 mL Mouth Rinse PRN Erin Fulling, MD   15 mL at 10/22/23 0759   sertraline (ZOLOFT) tablet 25 mg  25 mg Oral QHS Lowella Bandy, RPH   25 mg at 10/25/23 2125   sodium chloride flush (NS) 0.9 % injection 10-40 mL  10-40 mL Intracatheter PRN Erin Fulling, MD       spironolactone (ALDACTONE) tablet 25 mg  25 mg Oral Daily Enedina Finner, MD   25 mg  at 10/26/23 2952     Discharge Medications: Please see discharge summary for a list of discharge medications.   STOP taking these medications     amLODipine 10 MG tablet Commonly known as: NORVASC    tamsulosin 0.4 MG Caps capsule Commonly known as: Doristine Locks    Evaristo Bury FlexTouch  100 UNIT/ML FlexTouch Pen Generic drug: insulin degludec           TAKE these medications     acetaminophen 500 MG tablet Commonly known as: TYLENOL Take 1,000 mg by mouth 3 (three) times daily.    albuterol (2.5 MG/3ML) 0.083% nebulizer solution Commonly known as: PROVENTIL Take 2.5 mg by nebulization 4 (four) times daily.    apixaban 5 MG Tabs tablet Commonly known as: ELIQUIS 2 tabs twice a day for three more days, then 1 tab twice a day indefinitely    atenolol 50 MG tablet Commonly known as: TENORMIN Take 1 tablet (50 mg total) by mouth daily. Start taking on: October 27, 2023 What changed:  medication strength how much to take    atorvastatin 10 MG tablet Commonly known as: LIPITOR Take 10 mg by mouth daily.    BD Pen Needle Micro U/F 32G X 6 MM Misc Generic drug: Insulin Pen Needle USE 4 TO 5 TIMES DAILY WITH INSULIN USE    Boostrix 5-2.5-18.5 LF-MCG/0.5 injection Generic drug: Tdap    donepezil 5 MG tablet Commonly known as: ARICEPT Take 5 mg by mouth at bedtime.    folic acid 400 MCG tablet Commonly known as: FOLVITE Take 400 mcg by mouth daily.    furosemide 40 MG tablet Commonly known as: LASIX Take 1 tablet (40 mg total) by mouth daily. Start taking on: October 27, 2023    gabapentin 300 MG capsule Commonly known as: NEURONTIN Take 300 mg by mouth 3 (three) times daily.    glucose blood test strip Use to check blood sugar 2-3 times daily and document, bring results to provider visits.    OneTouch Ultra test strip Generic drug: glucose blood 1 each by Other route 2 (two) times daily. Use as instructed    HumaLOG KwikPen 100 UNIT/ML KwikPen Generic drug:  insulin lispro Inject 0-6 Units into the skin 4 (four) times daily -  before meals and at bedtime.    ipratropium 0.02 % nebulizer solution Commonly known as: ATROVENT Take 0.5 mg by nebulization 4 (four) times daily.    lidocaine 4 % Place 1 patch onto the skin daily.    methocarbamol 750 MG tablet Commonly known as: ROBAXIN Take 750 mg by mouth 4 (four) times daily.    OneTouch Delica Lancets 33G Misc 1 applicator by Does not apply route in the morning and at bedtime.    onetouch ultrasoft lancets Use to check blood sugar 2-3 times daily and document, bring results to provider visits.    onetouch ultrasoft lancets 1 each by Other route 2 (two) times daily. Use as instructed    OneTouch Verio w/Device Kit Use to check blood sugar 2-3 times daily and document, bring results to provider visits.    ONE TOUCH ULTRA 2 w/Device Kit 1 each by Does not apply route 2 (two) times daily.    polyethylene glycol 17 g packet Commonly known as: MIRALAX / GLYCOLAX Take 17 g by mouth 2 (two) times daily.    senna 8.6 MG Tabs tablet Commonly known as: SENOKOT Take 2 tablets by mouth 2 (two) times daily.    sertraline 25 MG tablet Commonly known as: ZOLOFT Take 1 tablet (25 mg total) by mouth daily. You can increase to 50 mg in 2 weeks if you are still having mood symptoms.    spironolactone 25 MG tablet Commonly known as: ALDACTONE Take 1 tablet (25 mg total) by mouth daily.  Relevant Imaging Results:  Relevant Lab Results:   Additional Information [id number]  Allena Katz, LCSW

## 2023-10-26 NOTE — Progress Notes (Signed)
Called facility to give report. Spoke with Crystal, receptionist. Waited on hold 5 minutes. Gave phone number for nurse receiving pt in [facility] to call back. No call back received. EMS here to transport pt from hospital to facility.

## 2023-10-26 NOTE — Plan of Care (Signed)
  Problem: Education: Goal: Ability to describe self-care measures that may prevent or decrease complications (Diabetes Survival Skills Education) will improve Outcome: Adequate for Discharge Goal: Individualized Educational Video(s) Outcome: Adequate for Discharge   Problem: Coping: Goal: Ability to adjust to condition or change in health will improve Outcome: Adequate for Discharge   Problem: Fluid Volume: Goal: Ability to maintain a balanced intake and output will improve Outcome: Adequate for Discharge   Problem: Health Behavior/Discharge Planning: Goal: Ability to identify and utilize available resources and services will improve Outcome: Adequate for Discharge Goal: Ability to manage health-related needs will improve Outcome: Adequate for Discharge   Problem: Metabolic: Goal: Ability to maintain appropriate glucose levels will improve Outcome: Adequate for Discharge   Problem: Nutritional: Goal: Maintenance of adequate nutrition will improve Outcome: Adequate for Discharge Goal: Progress toward achieving an optimal weight will improve Outcome: Adequate for Discharge   Problem: Skin Integrity: Goal: Risk for impaired skin integrity will decrease Outcome: Adequate for Discharge   Problem: Tissue Perfusion: Goal: Adequacy of tissue perfusion will improve Outcome: Adequate for Discharge   Problem: Education: Goal: Knowledge of General Education information will improve Description: Including pain rating scale, medication(s)/side effects and non-pharmacologic comfort measures Outcome: Adequate for Discharge   Problem: Health Behavior/Discharge Planning: Goal: Ability to manage health-related needs will improve Outcome: Adequate for Discharge   Problem: Clinical Measurements: Goal: Ability to maintain clinical measurements within normal limits will improve Outcome: Adequate for Discharge Goal: Will remain free from infection Outcome: Adequate for Discharge Goal:  Diagnostic test results will improve Outcome: Adequate for Discharge Goal: Respiratory complications will improve Outcome: Adequate for Discharge Goal: Cardiovascular complication will be avoided Outcome: Adequate for Discharge   Problem: Activity: Goal: Risk for activity intolerance will decrease Outcome: Adequate for Discharge   Problem: Nutrition: Goal: Adequate nutrition will be maintained Outcome: Adequate for Discharge   Problem: Coping: Goal: Level of anxiety will decrease Outcome: Adequate for Discharge   Problem: Elimination: Goal: Will not experience complications related to bowel motility Outcome: Adequate for Discharge Goal: Will not experience complications related to urinary retention Outcome: Adequate for Discharge   Problem: Pain Management: Goal: General experience of comfort will improve Outcome: Adequate for Discharge   Problem: Safety: Goal: Ability to remain free from injury will improve Outcome: Adequate for Discharge   Problem: Skin Integrity: Goal: Risk for impaired skin integrity will decrease Outcome: Adequate for Discharge   Problem: Activity: Goal: Ability to tolerate increased activity will improve Outcome: Adequate for Discharge   Problem: Respiratory: Goal: Ability to maintain adequate ventilation will improve Outcome: Adequate for Discharge Goal: Ability to maintain a clear airway will improve Outcome: Adequate for Discharge

## 2023-10-26 NOTE — TOC Progression Note (Signed)
Transition of Care [facility]) - Progression Note    Patient Details  Name: Tami Cummings MRN: [id number] Date of Birth: Nov 13, 1946  Transition of Care [facility]) CM/SW Contact  Allena Katz, LCSW Phone Number: 10/26/2023, 9:51 AM  Deadline for P2P is today, 11/8 @ 1:30 pm. Toni Amend w/Home Comm Care [facility]) left this number [phone], option 5. MD will need name, DOB & Member ID [id number].   Expected Discharge Plan: Skilled Nursing Facility    Expected Discharge Plan and Services                                               Social Determinants of Health (SDOH) Interventions SDOH Screenings   Food Insecurity: No Food Insecurity (10/12/2023)  Housing: Low Risk  (10/12/2023)  Transportation Needs: No Transportation Needs (10/12/2023)  Utilities: Not At Risk (10/12/2023)  Alcohol Screen: Low Risk  (08/08/2022)  Depression (PHQ2-9): Low Risk  (05/07/2023)  Financial Resource Strain: Low Risk  (09/03/2023)   Received from [facility]  Physical Activity: Inactive (08/08/2022)  Social Connections: Unknown (08/08/2022)  Recent Concern: Social Connections - Socially Isolated (08/08/2022)  Stress: No Stress Concern Present (08/08/2022)  Tobacco Use: Medium Risk (10/12/2023)    Readmission Risk Interventions    10/17/2023   12:04 PM  Readmission Risk Prevention Plan  Transportation Screening Complete  Home Care Screening --  Medication Review (RN CM) Complete

## 2023-10-26 NOTE — Progress Notes (Signed)
[facility]  - [place] [facility]   PATIENT NAME: Tami Cummings    MR#:  [id number]  DATE OF BIRTH:  1946/02/22  SUBJECTIVE:  No complaints, reports breathing stable, tolerating diet   VITALS:  Blood pressure (!) 143/63, pulse 66, temperature 97.8 F (36.6 C), temperature source Oral, resp. rate 20, height 5\' 1"  (1.549 m), weight 61.8 kg, SpO2 92%.  PHYSICAL EXAMINATION:   GENERAL:  77 y.o.-year-old patient with no acute distress. Appears chronically ill LUNGS: rales at bases otherwise clear CARDIOVASCULAR: S1, S2 normal. No murmur   ABDOMEN: Soft, nontender, nondistended.  EXTREMITIES: No  edema b/l.    NEUROLOGIC: nonfocal  patient is alert and awake SKIN: no visible lesions  LABORATORY PANEL:  CBC Recent Labs  Lab 10/22/23 0335  WBC 6.6  HGB 10.8*  HCT 35.6*  PLT 85*    Chemistries  Recent Labs  Lab 10/24/23 0354 10/25/23 0453 10/26/23 0500  NA 141   < > 141  K 3.2*   < > 4.0  CL 99   < > 100  CO2 34*   < > 33*  GLUCOSE 141*   < > 178*  BUN 19   < > 14  CREATININE 0.48   < > 0.46  CALCIUM 8.0*   < > 8.2*  MG 2.0  --   --    < > = values in this interval not displayed.   Assessment and Plan Tami Cummings is a 77 y.o. female with medical history significant of DM2, dementia who presented from [facility] for concern for possible PNA.   PER [facility] notes--The patient was recently hospitalized (08/31/23 - 09/10/23) for wound dehiscence after back surgery of T9-T12 fusion (done 07/30/23) following an injury that occurred in July of this year. After a washout she was discharged to a SNF with a PICC line for Vancomycin and Rocephin scheduled to d/c abx 10/11/23.   10/11/23: admitted to hospital service and treated for acute hypoxic respiratory failure 10/25: Agitated, trying to get OOB 10/26: Confused, agitated, trying to get OOB.  Required Haldol and Zyprexa. 10/27: Confused, refusing medications 10/28: excessive secretions in  upper airway requiring suctioning 10/29: Transferred to the ICU due to increased work of breathing and O2 desaturation requiring mechanical ventilation and vasopressor support 10/30: Pt remains mechanically intubated vent settings improving: FiO2 45%/PEEP 10.  Requiring levophed gtt @5  mcg/min and vasopressin @0 .04 units/min.  Right-sided chest tube placed due to large pleural effusion  10/31: Pt no longer requiring vasopressors and maintain map 65 or higher.  During WUA pts following commands.  Performed SBT, however became tachypneic with accessory muscle use and hypoxic placed back on full support.  Will diurese with 60 mg iv lasix x1 dose.  Will reattempt SBT following diuresis  11/1: On minimal vent support, Diurese again with 60 mg IV Lasix x1 dose then perform SBT as tolerated.  With worsening thrombocytopenia, start Argatroban while ruling out HIT and DIC.  In the afternoon family requests 1 way extubation with transition to Comfort Measures should she not tolerate extubation 11/2: comfortable on hi-flo nasal cannula 11/4-- assumed care of pt from today. On 2 L nasal count oxygen. Breathing comfortably. I will transfer out of ICU. No family at bedside. Will have PT OT see patient. Patient's pigtail catheter was removed yesterday by pulmonary. 11/5-- overall doing well. Some cough. If remains stable patient will discharge to [facility] healthcare tomorrow. Discussed with [name] on the phone  Acute  on chronic hypoxic hypercapnic respiratory failure bilateral pleural effusion status post chest tube placement on the right side now removed bilateral pneumonia -- patient currently on 2 L nasal cannula oxygen -- completed seven days of antibiotics -- Incentive spirometer, flutter valve  Acute decompensated heart failure with preserved ejection fraction -- patient diuresed. 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 No answer when daughter called today Consults : none CODE STATUS: full DVT Prophylaxis : eliquis Level of care: Telemetry Medical Status is: Inpatient Remains inpatient appropriate because: pending insurance auth for snf    TOTAL TIME TAKING CARE OF THIS PATIENT: 35 minutes.     Silvano Bilis M.D    [facility]

## 2023-10-26 NOTE — Plan of Care (Signed)
PMT shadowing. Patient is resting in bed at this time, eyes closed, even and unlabored respirations. No distress noted. No family at bedside. Plans in place for SNF.  Please reach out to PMT if needs arise.

## 2023-10-26 NOTE — Discharge Summary (Addendum)
Tami Cummings ZOX:[id number] DOB: 17-Aug-1946 DOA: 10/11/2023  PCP: Larae Grooms, NP  Admit date: 10/11/2023 Discharge date: 10/26/2023  Time spent: 35 minutes  Recommendations for Outpatient Follow-up:  Close pcp f/u (need to schedule) Bmp one week    Discharge Diagnoses:  Principal Problem:   Hypoxia Active Problems:   Acute on chronic respiratory failure with hypoxia and hypercapnia (HCC)   HAP (hospital-acquired pneumonia)   Acute deep vein thrombosis (DVT) of calf muscle vein (HCC)   Pleural effusion   Acute on chronic diastolic congestive heart failure (HCC)   Acute respiratory failure with hypoxia (HCC)   Discharge Condition: stable  Diet recommendation: heart healthy  Filed Weights   10/21/23 0400 10/22/23 0400 10/23/23 0400  Weight: 67.8 kg 63.2 kg 61.8 kg    History of present illness:  From admission h and p Tami Cummings is a 77 y.o. female with medical history significant of DM2, dementia who presented from [facility] for concern for possible PNA.   Pt was not completely oriented, but was coherent and able to answer ROS questions appropriately.  Only listed contact daughter was not reachable by phone.   Pt reported mild dyspnea for the last 3-4 days.  No cough, no sore throat.  No chest pain, abdominal pain, N/V/D, dysuria.  Pt said her back wound is all healed up.  Pt said she has been having normal oral intake, and despite lack of teeth, she said she can chew and swallow without problem.  Pt said depending on the situation, she walks with walker, cane, or unassisted.   Of note, pt was recently hospitalized in [facility] from 08/31/23 to 09/10/23 for wound dehiscence.  The wound originally came from T9-12 posterior fusion on 07/30/2023 for a T11 chalk stick fracture suffered from a fall in July.  Pt was taken to the OR on 08/31/2023 for a thoracic wound washout and complex wound closure.  Pt was discharged to SNF rehab with PICC line for vanc and  Rocephin til 10/12/23.      Hospital Course:  Tami Cummings is a 77 y.o. female with medical history significant of DM2, dementia who presented from [facility] for concern for possible PNA.    PER [facility] notes--The patient was recently hospitalized (08/31/23 - 09/10/23) for wound dehiscence after back surgery of T9-T12 fusion (done 07/30/23) following an injury that occurred in July of this year. After a washout she was discharged to a SNF with a PICC line for Vancomycin and Rocephin scheduled to d/c abx 10/11/23.    10/11/23: admitted to hospital service and treated for acute hypoxic respiratory failure 10/25: Agitated, trying to get OOB 10/26: Confused, agitated, trying to get OOB.  Required Haldol and Zyprexa. 10/27: Confused, refusing medications 10/28: excessive secretions in upper airway requiring suctioning 10/29: Transferred to the ICU due to increased work of breathing and O2 desaturation requiring mechanical ventilation and vasopressor support 10/30: Pt remains mechanically intubated vent settings improving: FiO2 45%/PEEP 10.  Requiring levophed gtt @5  mcg/min and vasopressin @0 .04 units/min.  Right-sided chest tube placed due to large pleural effusion  10/31: Pt no longer requiring vasopressors and maintain map 65 or higher.  During WUA pts following commands.  Performed SBT, however became tachypneic with accessory muscle use and hypoxic placed back on full support.  Will diurese with 60 mg iv lasix x1 dose.  Will reattempt SBT following diuresis  11/1: On minimal vent support, Diurese again with 60 mg IV Lasix x1 dose then perform  SBT as tolerated.  With worsening thrombocytopenia, start Argatroban while ruling out HIT and DIC.  In the afternoon family requests 1 way extubation with transition to Comfort Measures should she not tolerate extubation 11/2: comfortable on hi-flo nasal cannula 11/4-- assumed care of pt from today. On 2 L nasal count oxygen. Breathing comfortably.  I will transfer out of ICU. No family at bedside. Will have PT OT see patient. Patient's pigtail catheter was removed yesterday by pulmonary. 11/5-- stable for discharge from this point onward   Acute on chronic hypoxic hypercapnic respiratory failure bilateral pleural effusion status post chest tube placement on the right side now removed bilateral pneumonia -- patient currently on 2 L nasal cannula oxygen -- completed seven days of antibiotics -- Incentive spirometer, flutter valve, wean o2 as able - d/c reviewed with daughter who is in agreement with it   Acute decompensated heart failure with preserved ejection fraction -- patient diuresed. Creatinine stable -- continue  Lasix and spironolactone - bmp one week   Acute metabolic encephalopathy in the setting of infection advanced dementia -- mentation appears to be more or less at baseline   History of DVT With right calf dvt on PVL 10/30 -- continue apixaban   Type II diabetes with mild hyperglycemia, hyperlipidemia -- sliding scale insulin for now, glucose well controlled -- continue statins --A1c oct 2024--6.2%   Hypokalemia Resolved - monitor   Hypertension  BPs soft now improving - decreased home atenolol and holding amlodpine - continue spiro/lasix    Procedures: Chest tube, intubation  Consultations: pccm  Discharge Exam: Vitals:   10/26/23 0950 10/26/23 0953  BP:    Pulse:    Resp:    Temp:    SpO2: (!) 86% 92%    GENERAL:  77 y.o.-year-old patient with no acute distress. Appears chronically ill LUNGS: rales at bases otherwise clear CARDIOVASCULAR: S1, S2 normal. No murmur   ABDOMEN: Soft, nontender, nondistended.  EXTREMITIES: No  edema b/l.    NEUROLOGIC: nonfocal  patient is alert and awake SKIN: no visible lesions  Discharge Instructions   Discharge Instructions     Diet - low sodium heart healthy   Complete by: As directed    Discharge wound care:   Complete by: As directed     Standard decubitus ulcer wound care   Increase activity slowly   Complete by: As directed       Allergies as of 10/26/2023       Reactions   Empagliflozin    Actos [pioglitazone] Other (See Comments)   CHF   Amlodipine Swelling   Leg swelling   Benazepril Other (See Comments)   angioedema   Hydrochlorothiazide Other (See Comments)   angioedema   Metformin And Related Diarrhea        Medication List     STOP taking these medications    amLODipine 10 MG tablet Commonly known as: NORVASC   tamsulosin 0.4 MG Caps capsule Commonly known as: FLOMAX   Tresiba FlexTouch 100 UNIT/ML FlexTouch Pen Generic drug: insulin degludec       TAKE these medications    acetaminophen 500 MG tablet Commonly known as: TYLENOL Take 1,000 mg by mouth 3 (three) times daily.   albuterol (2.5 MG/3ML) 0.083% nebulizer solution Commonly known as: PROVENTIL Take 2.5 mg by nebulization 4 (four) times daily.   apixaban 5 MG Tabs tablet Commonly known as: ELIQUIS 2 tabs twice a day for three more days, then 1 tab twice a day indefinitely  atenolol 50 MG tablet Commonly known as: TENORMIN Take 1 tablet (50 mg total) by mouth daily. Start taking on: October 27, 2023 What changed:  medication strength how much to take   atorvastatin 10 MG tablet Commonly known as: LIPITOR Take 10 mg by mouth daily.   BD Pen Needle Micro U/F 32G X 6 MM Misc Generic drug: Insulin Pen Needle USE 4 TO 5 TIMES DAILY WITH INSULIN USE   Boostrix 5-2.5-18.5 LF-MCG/0.5 injection Generic drug: Tdap   donepezil 5 MG tablet Commonly known as: ARICEPT Take 5 mg by mouth at bedtime.   folic acid 400 MCG tablet Commonly known as: FOLVITE Take 400 mcg by mouth daily.   furosemide 40 MG tablet Commonly known as: LASIX Take 1 tablet (40 mg total) by mouth daily. Start taking on: October 27, 2023   gabapentin 300 MG capsule Commonly known as: NEURONTIN Take 300 mg by mouth 3 (three) times daily.    glucose blood test strip Use to check blood sugar 2-3 times daily and document, bring results to provider visits.   OneTouch Ultra test strip Generic drug: glucose blood 1 each by Other route 2 (two) times daily. Use as instructed   HumaLOG KwikPen 100 UNIT/ML KwikPen Generic drug: insulin lispro Inject 0-6 Units into the skin 4 (four) times daily -  before meals and at bedtime.   ipratropium 0.02 % nebulizer solution Commonly known as: ATROVENT Take 0.5 mg by nebulization 4 (four) times daily.   lidocaine 4 % Place 1 patch onto the skin daily.   methocarbamol 750 MG tablet Commonly known as: ROBAXIN Take 750 mg by mouth 4 (four) times daily.   OneTouch Delica Lancets 33G Misc 1 applicator by Does not apply route in the morning and at bedtime.   onetouch ultrasoft lancets Use to check blood sugar 2-3 times daily and document, bring results to provider visits.   onetouch ultrasoft lancets 1 each by Other route 2 (two) times daily. Use as instructed   OneTouch Verio w/Device Kit Use to check blood sugar 2-3 times daily and document, bring results to provider visits.   ONE TOUCH ULTRA 2 w/Device Kit 1 each by Does not apply route 2 (two) times daily.   polyethylene glycol 17 g packet Commonly known as: MIRALAX / GLYCOLAX Take 17 g by mouth 2 (two) times daily.   senna 8.6 MG Tabs tablet Commonly known as: SENOKOT Take 2 tablets by mouth 2 (two) times daily.   sertraline 25 MG tablet Commonly known as: ZOLOFT Take 1 tablet (25 mg total) by mouth daily. You can increase to 50 mg in 2 weeks if you are still having mood symptoms.   spironolactone 25 MG tablet Commonly known as: ALDACTONE Take 1 tablet (25 mg total) by mouth daily.               Discharge Care Instructions  (From admission, onward)           Start     Ordered   10/26/23 0000  Discharge wound care:       Comments: Standard decubitus ulcer wound care   10/26/23 1205            Allergies  Allergen Reactions   Empagliflozin    Actos [Pioglitazone] Other (See Comments)    CHF   Amlodipine Swelling    Leg swelling    Benazepril Other (See Comments)    angioedema   Hydrochlorothiazide Other (See Comments)    angioedema  Metformin And Related Diarrhea    Follow-up Information     Larae Grooms, NP Follow up.   Specialty: Nurse Practitioner Contact information: [address] [place] [address] [phone]                  The results of significant diagnostics from this hospitalization (including imaging, microbiology, ancillary and laboratory) are listed below for reference.    Significant Diagnostic Studies: DG CHEST PORT 1 VIEW  Result Date: 10/22/2023 CLINICAL DATA:  Pleural effusion. EXAM: PORTABLE CHEST 1 VIEW COMPARISON:  Chest radiograph dated 10/20/2023. FINDINGS: Right-sided PICC with tip at the cavoatrial junction. Cardiomegaly with vascular congestion and edema. Small bilateral pleural effusions with bibasilar atelectasis or infiltrate. No pneumothorax. Atherosclerotic calcification of the aorta. No acute osseous pathology. Osteopenia with degenerative changes of the spine. Lower thoracic fusion hardware. IMPRESSION: 1. Cardiomegaly with vascular congestion and edema. 2. Small bilateral pleural effusions with bibasilar atelectasis or infiltrate. Electronically Signed   By: Elgie Collard M.D.   On: 10/22/2023 21:16   DG Chest Port 1 View  Result Date: 10/20/2023 CLINICAL DATA:  Hypoxia EXAM: PORTABLE CHEST 1 VIEW COMPARISON:  Yesterday FINDINGS: Tracheal and esophageal extubation. Lung volumes are lower. Continued hazy appearance of the lower chest greater on the left due to pulmonary opacification and pleural fluid. Patchy airspace density also in the upper lungs. Right PICC with tip at the upper cavoatrial junction. There is a pigtail catheter near the right costophrenic sulcus laterally. Stable cardiac enlargement.  IMPRESSION: Lower lung volumes after extubation. Pneumonia and pleural fluid is unchanged from prior. Electronically Signed   By: Tiburcio Pea M.D.   On: 10/20/2023 04:31   DG Chest Port 1 View  Result Date: 10/19/2023 CLINICAL DATA:  77 year old female respiratory failure and hypoxia. EXAM: PORTABLE CHEST 1 VIEW COMPARISON:  Chest CT 10/17/2023.  Portable chest that day. FINDINGS: Portable AP semi upright view at 1019 hours. Bilateral pneumonia and sizable pleural effusions on the recent CT. Patient remains intubated, enteric tube remains in place. Right PICC line remains in place. Mildly larger lung volumes and mildly improved perihilar ventilation compared to the radiograph on 10/17/2023. Right side pleural pigtail tube projects at the lateral costophrenic sulcus now. Right pleural effusion remains regressed. Left pleural effusion not significantly changed. No pneumothorax. No areas of worsening ventilation. Chronic spinal hardware. IMPRESSION: 1. Right pleural pigtail drain now projects deep in the right lateral costophrenic sulcus. Otherwise stable lines and tubes. 2. Right pleural effusion remains regressed.  No pneumothorax. 3. Stable left pleural effusion and bilateral lung base pneumonia. Mildly improved perihilar lung ventilation since 10/17/2023. Electronically Signed   By: Odessa Fleming M.D.   On: 10/19/2023 14:25   DG Chest Port 1 View  Result Date: 10/17/2023 CLINICAL DATA:  Chest tube placement EXAM: PORTABLE CHEST 1 VIEW COMPARISON:  10/17/2023 FINDINGS: Endotracheal tube is present with tip measuring 5.4 cm above the carina. An enteric tube is present with tip off the field of view but below the left hemidiaphragm. A right PICC line is present with tip over the low SVC region. No pneumothorax. Cardiac enlargement with mild vascular congestion and perihilar edema. Small left pleural effusion, increasing since prior study, with basilar atelectasis or consolidation bilaterally. A right chest tube  has been placed. IMPRESSION: 1. Appliances appear in satisfactory position. 2. Cardiac enlargement with mild perihilar edema. 3. Small left pleural effusion, increasing since prior study. Bilateral basilar infiltration or atelectasis. Electronically Signed   By: Chrissie Noa  Andria Meuse M.D.   On: 10/17/2023 20:09   ECHOCARDIOGRAM COMPLETE  Result Date: 10/17/2023    ECHOCARDIOGRAM REPORT   Patient Name:   JETT STANG Date of Exam: 10/17/2023 Medical Rec #:  [id number]            Height:       61.0 in Accession #:    [id number]           Weight:       172.0 lb Date of Birth:  [birth year]            BSA:          1.771 m Patient Age:    49 years             BP:           139/60 mmHg Patient Gender: F                    HR:           65 bpm. Exam Location:  [facility] Procedure: 2D Echo, Cardiac Doppler and Color Doppler Indications:     Pulmonary embolus  History:         Patient has prior history of Echocardiogram examinations, most                  recent 10/12/2023. Risk Factors:Hypertension, Diabetes and                  Dyslipidemia. CKD, Pulmonary embolus.  Sonographer:     Mikki Harbor Referring Phys:  [id number] Judithe Modest Diagnosing Phys: Julien Nordmann MD  Sonographer Comments: Echo performed with patient supine and on artificial respirator. IMPRESSIONS  1. Left ventricular ejection fraction, by estimation, is 55 to 60%. The left ventricle has normal function. The left ventricle has no regional wall motion abnormalities. There is mild left ventricular hypertrophy. Left ventricular diastolic parameters are consistent with Grade I diastolic dysfunction (impaired relaxation).  2. Right ventricular systolic function is normal. The right ventricular size is normal. There is moderately elevated pulmonary artery systolic pressure. The estimated right ventricular systolic pressure is 45.9 mmHg.  3. The mitral valve is normal in structure. No evidence of mitral valve regurgitation. No evidence of mitral  stenosis.  4. Tricuspid valve regurgitation is moderate.  5. The aortic valve has an indeterminant number of cusps. Aortic valve regurgitation is not visualized. Aortic valve sclerosis/calcification is present, without any evidence of aortic stenosis.  6. The inferior vena cava is normal in size with greater than 50% respiratory variability, suggesting right atrial pressure of 3 mmHg. FINDINGS  Left Ventricle: Left ventricular ejection fraction, by estimation, is 55 to 60%. The left ventricle has normal function. The left ventricle has no regional wall motion abnormalities. The left ventricular internal cavity size was normal in size. There is  mild left ventricular hypertrophy. Left ventricular diastolic parameters are consistent with Grade I diastolic dysfunction (impaired relaxation). Right Ventricle: The right ventricular size is normal. No increase in right ventricular wall thickness. Right ventricular systolic function is normal. There is moderately elevated pulmonary artery systolic pressure. The tricuspid regurgitant velocity is 3.08 m/s, and with an assumed right atrial pressure of 8 mmHg, the estimated right ventricular systolic pressure is 45.9 mmHg. Left Atrium: Left atrial size was normal in size. Right Atrium: Right atrial size was normal in size. Pericardium: There is no evidence of pericardial effusion. Mitral Valve: The mitral valve is normal in structure. Mild mitral annular  calcification. No evidence of mitral valve regurgitation. No evidence of mitral valve stenosis. MV peak gradient, 3.7 mmHg. The mean mitral valve gradient is 2.0 mmHg. Tricuspid Valve: The tricuspid valve is normal in structure. Tricuspid valve regurgitation is moderate . No evidence of tricuspid stenosis. Aortic Valve: The aortic valve has an indeterminant number of cusps. Aortic valve regurgitation is not visualized. Aortic valve sclerosis/calcification is present, without any evidence of aortic stenosis. Aortic valve mean  gradient measures 5.0 mmHg. Aortic valve peak gradient measures 10.8 mmHg. Aortic valve area, by VTI measures 1.94 cm. Pulmonic Valve: The pulmonic valve was normal in structure. Pulmonic valve regurgitation is mild. No evidence of pulmonic stenosis. Aorta: The aortic root is normal in size and structure. Venous: The inferior vena cava is normal in size with greater than 50% respiratory variability, suggesting right atrial pressure of 3 mmHg. IAS/Shunts: No atrial level shunt detected by color flow Doppler.  LEFT VENTRICLE PLAX 2D LVIDd:         4.30 cm   Diastology LVIDs:         2.70 cm   LV e' medial:    6.74 cm/s LV PW:         1.10 cm   LV E/e' medial:  12.6 LV IVS:        1.20 cm   LV e' lateral:   7.83 cm/s LVOT diam:     1.90 cm   LV E/e' lateral: 10.8 LV SV:         71 LV SV Index:   40 LVOT Area:     2.84 cm  RIGHT VENTRICLE RV Basal diam:  3.70 cm RV Mid diam:    3.20 cm RV S prime:     11.10 cm/s TAPSE (M-mode): 2.2 cm LEFT ATRIUM             Index        RIGHT ATRIUM           Index LA diam:        3.20 cm 1.81 cm/m   RA Area:     15.70 cm LA Vol (A2C):   54.7 ml 30.88 ml/m  RA Volume:   40.70 ml  22.98 ml/m LA Vol (A4C):   31.9 ml 18.01 ml/m LA Biplane Vol: 41.7 ml 23.54 ml/m  AORTIC VALVE                     PULMONIC VALVE AV Area (Vmax):    1.92 cm      PV Vmax:       1.15 m/s AV Area (Vmean):   1.81 cm      PV Peak grad:  5.3 mmHg AV Area (VTI):     1.94 cm AV Vmax:           164.00 cm/s AV Vmean:          102.000 cm/s AV VTI:            0.364 m AV Peak Grad:      10.8 mmHg AV Mean Grad:      5.0 mmHg LVOT Vmax:         111.00 cm/s LVOT Vmean:        65.000 cm/s LVOT VTI:          0.249 m LVOT/AV VTI ratio: 0.68  AORTA Ao Root diam: 2.80 cm MITRAL VALVE               TRICUSPID  VALVE MV Area (PHT): 2.58 cm    TR Peak grad:   37.9 mmHg MV Area VTI:   1.95 cm    TR Vmax:        308.00 cm/s MV Peak grad:  3.7 mmHg MV Mean grad:  2.0 mmHg    SHUNTS MV Vmax:       0.96 m/s    Systemic VTI:   0.25 m MV Vmean:      59.2 cm/s   Systemic Diam: 1.90 cm MV Decel Time: 294 msec MV E velocity: 84.90 cm/s MV A velocity: 90.10 cm/s MV E/A ratio:  0.94 Julien Nordmann MD Electronically signed by Julien Nordmann MD Signature Date/Time: 10/17/2023/3:38:04 PM    Final    CT Angio Chest Pulmonary Embolism (PE) W or WO Contrast  Addendum Date: 10/17/2023   ADDENDUM REPORT: 10/17/2023 12:04 ADDENDUM: Study discussed by telephone with Dr. Aundria Rud on 10/17/2023 at 12:03 . Electronically Signed   By: Odessa Fleming M.D.   On: 10/17/2023 12:04   Result Date: 10/17/2023 CLINICAL DATA:  77 year old female with hypoxic respiratory failure. Suspected aspiration. Right calf vein DVT. EXAM: CT ANGIOGRAPHY CHEST WITH CONTRAST TECHNIQUE: Multidetector CT imaging of the chest was performed using the standard protocol during bolus administration of intravenous contrast. Multiplanar CT image reconstructions and MIPs were obtained to evaluate the vascular anatomy. RADIATION DOSE REDUCTION: This exam was performed according to the departmental dose-optimization program which includes automated exposure control, adjustment of the mA and/or kV according to patient size and/or use of iterative reconstruction technique. CONTRAST:  75mL OMNIPAQUE IOHEXOL 350 MG/ML SOLN COMPARISON:  Lower extremity Doppler ultrasound today reported separately. FINDINGS: Cardiovascular: Good contrast bolus timing in the pulmonary arterial tree. Streak artifact from lower thoracic spinal fusion hardware. No central or hilar pulmonary artery filling defect. No convincing pulmonary artery branch filling defect in either lung. Extensive calcified coronary artery com Calcified aortic atherosclerosis. Cardiac size within normal limits. No pericardial effusion. Mediastinum/Nodes: Enteric tube courses through the esophagus to the abdomen. Small, reactive appearing mediastinal lymph nodes. Lungs/Pleura: Large layering bilateral pleural effusions, simple fluid density.  Confluent bilateral lower lobe consolidation superimposed especially on the right. Intubated. Endotracheal tube tip in good position above the carina. Central airways are patent. Superimposed multifocal patchy and confluent bilateral upper lobe peribronchial opacity ranging from ground-glass to solid. No lobes are spared. Upper Abdomen: Enteric tube terminates at the distal gastric body. Cholecystectomy. Small volume free fluid, ascites in the bilateral upper abdomen including adjacent to the liver. Simple fluid density. Partially visible liver, no discrete liver lesion. Spleen size appears to remain normal. 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 Contralateral right adrenal gland nodule is 3.5 cm, measuring 10 Hounsfield units, consistent with lipid-rich benign adenoma. No follow-up imaging is recommended [facility] 2017 Aug; 14(8):1038-44, JCAT 2016 Mar-Apr; 40(2):194-200, Urol J 2006 Spring; 3(2):71-4.) No dilated bowel in the upper abdomen. Partially visible kidneys appear nonobstructed. Musculoskeletal: Bulky Diffuse idiopathic skeletal hyperostosis (DISH). With widespread spinal interbody ankylosis. Superimposed lower thoracic posterior spinal fusion hardware beginning at T9. Underlying osteopenia. Underlying T11 and T12 compression fractures. No acute osseous abnormality identified. Review of the MIP images confirms the above findings. IMPRESSION: 1. No pulmonary embolus identified. Satisfactory endotracheal and enteric tubes. 2. Widespread Bilateral Pneumonia with extensive lower lobe consolidation right > left. Superimposed Large layering pleural effusions, favor transudate. 3. Nonspecific Ascites in the upper abdomen. 4. Diffuse idiopathic skeletal hyperostosis (DISH), widespread spinal ankylosis. Posterior spinal fusion hardware  T9 through T12. 5.  Aortic Atherosclerosis (ICD10-I70.0). Electronically Signed: By: Odessa Fleming M.D. On: 10/17/2023 12:01   US  Venous Img Lower Bilateral (DVT)  Addendum Date: 10/17/2023   ADDENDUM REPORT: 10/17/2023 12:04 ADDENDUM: Study discussed by telephone with Dr. Aundria Rud on 10/17/2023 at 12:03 . Electronically Signed   By: Odessa Fleming M.D.   On: 10/17/2023 12:04   Result Date: 10/17/2023 CLINICAL DATA:  77 year old female with hypoxic respiratory failure. Suspected aspiration. EXAM: BILATERAL LOWER EXTREMITY VENOUS DOPPLER ULTRASOUND TECHNIQUE: Gray-scale sonography with compression, as well as color and duplex ultrasound, were performed to evaluate the deep venous system(s) from the level of the common femoral vein through the popliteal and proximal calf veins. COMPARISON:  CTA chest today reported separately. FINDINGS: VENOUS Normal compressibility of the left common femoral, superficial femoral, and popliteal veins, as well as the visualized calf veins. Visualized portions of the left profunda femoral vein and great saphenous vein unremarkable. Right lower extremity deep venous structure with maintained compressibility, spectral and color Doppler flow from the level of the right Common vein through the distal popliteal vein. Right posterior tibial veins appear partially thrombosed and incompressible (image 33). No other filling defects to suggest DVT on grayscale or color Doppler imaging. OTHER None. Limitations: none IMPRESSION: 1. Evidence of right calf DVT, posterior tibial vein. 2. No other bilateral lower extremity deep venous thrombosis identified. Electronically Signed: By: Odessa Fleming M.D. On: 10/17/2023 11:53   CT HEAD WO CONTRAST ( )  Result Date: 10/17/2023 CLINICAL DATA:  Mental status change EXAM: CT HEAD WITHOUT CONTRAST TECHNIQUE: Contiguous axial images were obtained from the base of the skull through the vertex without intravenous contrast. RADIATION DOSE REDUCTION: This exam was performed according to the departmental dose-optimization program which includes automated exposure control, adjustment of the mA  and/or kV according to patient size and/or use of iterative reconstruction technique. COMPARISON:  None Available. FINDINGS: Brain: No evidence of acute infarction, hemorrhage, mass, mass effect, or midline shift. No hydrocephalus or extra-axial fluid collection. Periventricular white matter changes, likely the sequela of chronic small vessel ischemic disease. 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 Vascular: No hyperdense vessel. Atherosclerotic calcifications in the intracranial carotid and vertebral arteries. Skull: Negative for fracture or focal lesion. Sinuses/Orbits: No acute finding. Status post bilateral lens replacements. Dysconjugate gaze. Other: Fluid in the right mastoid air cells. Endotracheal and orogastric tubes. IMPRESSION: No acute intracranial process. Electronically Signed   By: Wiliam Ke M.D.   On: 10/17/2023 11:13   DG Chest Port 1 View  Result Date: 10/17/2023 CLINICAL DATA:  Acute respiratory failure with hypoxia. EXAM: PORTABLE CHEST 1 VIEW COMPARISON:  October 16, 2023. FINDINGS: Stable cardiomediastinal silhouette. Endotracheal and feeding tubes are unchanged. Right sided PICC line is unchanged. Stable left basilar atelectasis or infiltrate is noted with small left pleural effusion. Significantly increased right basilar opacity is noted suggesting worsening pneumonia or atelectasis. Status post surgical posterior fusion of lower thoracic spine. IMPRESSION: Stable support apparatus. Stable left basilar opacity as noted above. Significantly increased right basilar opacity is noted suggesting worsening pneumonia or atelectasis. Electronically Signed   By: Lupita Raider M.D.   On: 10/17/2023 08:22   [place] EKG SITE RITE  Result Date: 10/16/2023 If Site Rite image not attached, placement could not be confirmed due to current cardiac rhythm.  DG Chest Port 1 View  Result Date: 10/16/2023 CLINICAL DATA:  Intubation. EXAM: PORTABLE CHEST 1 VIEW  COMPARISON:  10/11/2023 FINDINGS:  Enteric tube tip crotch that endotracheal tube tip 3.6 cm from the carina. Enteric tube tip below the diaphragm not included in this chest field of view. There is a left upper extremity PICC, tip difficult to accurately assess due to rotation and overlying monitoring devices, but possibly in the brachiocephalic SVC confluence. Rotated exam. Similar left lung base opacity and pleural effusion. Improved right lung base opacity and pleural effusion. Small right pleural effusion persists. Cardiomegaly has improved. Pulmonary edema has improved. Vascular congestion persists. No pneumothorax. IMPRESSION: 1. Endotracheal tube tip 3.6 cm from the carina. 2. Left upper extremity PICC, tip difficult to accurately assess due to rotation and overlying monitoring devices, but possibly in the brachiocephalic SVC confluence. 3. Improved cardiomegaly and pulmonary edema over the last 5 days. Vascular congestion persists. 4. Similar left lung base opacity and pleural effusion. Improved right lung base opacity and pleural effusion. Electronically Signed   By: Narda Rutherford M.D.   On: 10/16/2023 12:10   DG Abd 1 View  Result Date: 10/16/2023 CLINICAL DATA:  Orogastric tube placement. EXAM: ABDOMEN - 1 VIEW COMPARISON:  None Available. FINDINGS: Tip and side port of the enteric tube below the diaphragm in the stomach. There is mild gaseous distention of stomach and transverse colon. No definite small bowel dilatation or evidence of obstruction. IMPRESSION: Tip and side port of the enteric tube below the diaphragm in the stomach. Electronically Signed   By: Narda Rutherford M.D.   On: 10/16/2023 12:06   ECHOCARDIOGRAM COMPLETE  Result Date: 10/12/2023    ECHOCARDIOGRAM REPORT   Patient Name:   Tami Cummings Date of Exam: 10/12/2023 Medical Rec #:  [id number]            Height:       61.0 in Accession #:    [id number]           Weight:       172.0 lb Date of Birth:  09-29-1946             BSA:          1.771 m Patient Age:    77 years             BP:           154/73 mmHg Patient Gender: F                    HR:           92 bpm. Exam Location:  [facility] Procedure: 2D Echo, Cardiac Doppler and Color Doppler Indications:     Dyspnea  History:         Patient has no prior history of Echocardiogram examinations.                  Signs/Symptoms:Dyspnea and Altered Mental Status; Risk                  Factors:Hypertension, Diabetes and Dyslipidemia. CKD.  Sonographer:     Mikki Harbor Referring Phys:  [id number] Inetta Fermo LAI Diagnosing Phys: Debbe Odea MD  Sonographer Comments: Image acquisition challenging due to patient behavioral factors. IMPRESSIONS  1. Left ventricular ejection fraction, by estimation, is 55 to 60%. The left ventricle has normal function. The left ventricle has no regional wall motion abnormalities. Left ventricular diastolic parameters are consistent with Grade II diastolic dysfunction (pseudonormalization).  2. Right ventricular systolic function is normal. The right ventricular size is normal. There is moderately elevated pulmonary artery systolic pressure.  3. The mitral valve is normal in structure. Mild mitral valve regurgitation.  4. The aortic valve has an indeterminant number of cusps. Aortic valve regurgitation is not visualized. Aortic valve sclerosis/calcification is present, without any evidence of aortic stenosis.  5. The inferior vena cava is dilated in size with <50% respiratory variability, suggesting right atrial pressure of 15 mmHg. FINDINGS  Left Ventricle: Left ventricular ejection fraction, by estimation, is 55 to 60%. The left ventricle has normal function. The left ventricle has no regional wall motion abnormalities. The left ventricular internal cavity size was normal in size. There is  no left ventricular hypertrophy. Left ventricular diastolic parameters are consistent with Grade II diastolic dysfunction (pseudonormalization). Right Ventricle: The right  ventricular size is normal. No increase in right ventricular wall thickness. Right ventricular systolic function is normal. There is moderately elevated pulmonary artery systolic pressure. The tricuspid regurgitant velocity is 3.25 m/s, and with an assumed right atrial pressure of 15 mmHg, the estimated right ventricular systolic pressure is 57.2 mmHg. Left Atrium: Left atrial size was normal in size. Right Atrium: Right atrial size was normal in size. Pericardium: There is no evidence of pericardial effusion. Mitral Valve: The mitral valve is normal in structure. Mild mitral valve regurgitation. MV peak gradient, 6.2 mmHg. The mean mitral valve gradient is 3.0 mmHg. Tricuspid Valve: The tricuspid valve is normal in structure. Tricuspid valve regurgitation is mild. Aortic Valve: The aortic valve has an indeterminant number of cusps. Aortic valve regurgitation is not visualized. Aortic valve sclerosis/calcification is present, without any evidence of aortic stenosis. Aortic valve mean gradient measures 4.0 mmHg. Aortic valve peak gradient measures 6.9 mmHg. Aortic valve area, by VTI measures 2.28 cm. Pulmonic Valve: The pulmonic valve was normal in structure. Pulmonic valve regurgitation is not visualized. Aorta: The aortic root is normal in size and structure. Venous: The inferior vena cava is dilated in size with less than 50% respiratory variability, suggesting right atrial pressure of 15 mmHg. IAS/Shunts: No atrial level shunt detected by color flow Doppler.  LEFT VENTRICLE PLAX 2D LVIDd:         4.50 cm   Diastology LVIDs:         3.10 cm   LV e' medial:    5.87 cm/s LV PW:         1.10 cm   LV E/e' medial:  21.3 LV IVS:        1.20 cm   LV e' lateral:   9.79 cm/s LVOT diam:     1.90 cm   LV E/e' lateral: 12.8 LV SV:         65 LV SV Index:   37 LVOT Area:     2.84 cm  RIGHT VENTRICLE RV Basal diam:  3.45 cm RV Mid diam:    3.00 cm RV S prime:     16.80 cm/s TAPSE (M-mode): 2.1 cm LEFT ATRIUM             Index         RIGHT ATRIUM           Index LA diam:        3.90 cm 2.20 cm/m   RA Area:     18.30 cm LA Vol (A2C):   55.7 ml 31.45 ml/m  RA Volume:   53.80 ml  30.37 ml/m LA Vol (A4C):   47.8 ml 26.99 ml/m LA Biplane Vol: 51.5 ml 29.07 ml/m  AORTIC VALVE  PULMONIC VALVE AV Area (Vmax):    2.09 cm     PV Vmax:       1.14 m/s AV Area (Vmean):   1.72 cm     PV Peak grad:  5.2 mmHg AV Area (VTI):     2.28 cm AV Vmax:           131.00 cm/s AV Vmean:          86.900 cm/s AV VTI:            0.286 m AV Peak Grad:      6.9 mmHg AV Mean Grad:      4.0 mmHg LVOT Vmax:         96.60 cm/s LVOT Vmean:        52.800 cm/s LVOT VTI:          0.230 m LVOT/AV VTI ratio: 0.80  AORTA Ao Root diam: 2.80 cm MITRAL VALVE                TRICUSPID VALVE MV Area (PHT): 4.63 cm     TR Peak grad:   42.2 mmHg MV Area VTI:   2.28 cm     TR Vmax:        325.00 cm/s MV Peak grad:  6.2 mmHg MV Mean grad:  3.0 mmHg     SHUNTS MV Vmax:       1.25 m/s     Systemic VTI:  0.23 m MV Vmean:      81.4 cm/s    Systemic Diam: 1.90 cm MV Decel Time: 164 msec MV E velocity: 125.00 cm/s MV A velocity: 109.00 cm/s MV E/A ratio:  1.15 Debbe Odea MD Electronically signed by Debbe Odea MD Signature Date/Time: 10/12/2023/5:02:44 PM    Final    DG Chest 2 View  Result Date: 10/11/2023 CLINICAL DATA:  Chest pain EXAM: CHEST - 2 VIEW COMPARISON:  None Available. FINDINGS: Left-sided PICC in place with the tip of the central SVC. Moderate pleural effusions. Lung base opacities with some vascular congestion. Question trace edema. No pneumothorax. Kyphotic x-ray obscures the apices. Heart appears enlarged but is obscured by the bilateral pleural effusions and opacities. Significant fixation hardware along the thoracic spine. Osteopenia and degenerative changes. IMPRESSION: Left-sided PICC. Bilateral pleural effusions with the opacities. Enlarged heart with vascular congestion and trace edema Electronically Signed   By: Karen Kays  M.D.   On: 10/11/2023 13:16    Microbiology: Recent Results (from the past 240 hour(s))  Culture, Respiratory w Gram Stain     Status: None   Collection Time: 10/16/23  1:34 PM   Specimen: Tracheal Aspirate; Respiratory  Result Value Ref Range Status   Specimen Description   Final    TRACHEAL ASPIRATE Performed at [facility], [address], [place], [place] [address]    Special Requests   Final    NONE Performed at [facility], [address] [place], [place] [address]    Gram Stain   Final    FEW WBC PRESENT,BOTH PMN AND MONONUCLEAR NO ORGANISMS SEEN    Culture   Final    Normal respiratory flora-no Staph aureus or Pseudomonas seen Performed at [facility], [address] [place], [place] [address]    Report Status 10/19/2023 FINAL  Final  Culture, blood (Routine X 2) w Reflex to ID Panel     Status: None   Collection Time: 10/16/23  4:43 PM   Specimen: BLOOD  Result  Value Ref Range Status   Specimen Description BLOOD LEFT ANTECUBITAL  Final   Special Requests   Final    BOTTLES DRAWN AEROBIC AND ANAEROBIC Blood Culture adequate volume   Culture   Final    NO GROWTH 5 DAYS Performed at [facility], [address] [place], [place] [address]    Report Status 10/21/2023 FINAL  Final  Culture, blood (Routine X 2) w Reflex to ID Panel     Status: None   Collection Time: 10/17/23  5:44 AM   Specimen: BLOOD  Result Value Ref Range Status   Specimen Description BLOOD BLOOD LEFT HAND  Final   Special Requests   Final    BOTTLES DRAWN AEROBIC AND ANAEROBIC Blood Culture adequate volume   Culture   Final    NO GROWTH 5 DAYS Performed at [facility], [address] [place], [place] [address]    Report Status 10/22/2023 FINAL  Final  Respiratory (~20 pathogens) panel by PCR     Status: None   Collection Time: 10/17/23  8:19 AM   Specimen: Nasopharyngeal Swab; Respiratory  Result Value Ref Range Status   Adenovirus NOT  DETECTED NOT DETECTED Final   Coronavirus 229E NOT DETECTED NOT DETECTED Final    Comment: (NOTE) The Coronavirus on the Respiratory Panel, DOES NOT test for the novel  Coronavirus (2019 nCoV)    Coronavirus HKU1 NOT DETECTED NOT DETECTED Final   Coronavirus NL63 NOT DETECTED NOT DETECTED Final   Coronavirus OC43 NOT DETECTED NOT DETECTED Final   Metapneumovirus NOT DETECTED NOT DETECTED Final   Rhinovirus / Enterovirus NOT DETECTED NOT DETECTED Final   Influenza A NOT DETECTED NOT DETECTED Final   Influenza B NOT DETECTED NOT DETECTED Final   Parainfluenza Virus 1 NOT DETECTED NOT DETECTED Final   Parainfluenza Virus 2 NOT DETECTED NOT DETECTED Final   Parainfluenza Virus 3 NOT DETECTED NOT DETECTED Final   Parainfluenza Virus 4 NOT DETECTED NOT DETECTED Final   Respiratory Syncytial Virus NOT DETECTED NOT DETECTED Final   Bordetella pertussis NOT DETECTED NOT DETECTED Final   Bordetella Parapertussis NOT DETECTED NOT DETECTED Final   Chlamydophila pneumoniae NOT DETECTED NOT DETECTED Final   Mycoplasma pneumoniae NOT DETECTED NOT DETECTED Final    Comment: Performed at [facility], [address] [place], [place] [address]  MRSA Next Gen by PCR, Nasal     Status: None   Collection Time: 10/17/23 12:10 PM   Specimen: Nasal Mucosa; Nasal Swab  Result Value Ref Range Status   MRSA by PCR Next Gen NOT DETECTED NOT DETECTED Final    Comment: (NOTE) The GeneXpert MRSA Assay (FDA approved for NASAL specimens only), is one component of a comprehensive MRSA colonization surveillance program. It is not intended to diagnose MRSA infection nor to guide or monitor treatment for MRSA infections. Test performance is not FDA approved in patients less than 54 years old. Performed at [facility], [address] [place], [place] [address]   Pleural fluid culture w Gram Stain     Status: None   Collection Time: 10/17/23  6:22 PM   Specimen: Pleural Fluid  Result Value Ref  Range Status   Specimen Description   Final    PLEURAL Performed at [facility], [address] [place], [place] [address]    Special Requests   Final    NONE Performed at [facility], [address], [place], [place] [address]    Gram  Stain   Final    WBC PRESENT, PREDOMINANTLY MONONUCLEAR NO ORGANISMS SEEN CYTOSPIN SMEAR    Culture   Final    NO GROWTH 3 DAYS Performed at [facility], [address] [place], [place] [address]    Report Status 10/21/2023 FINAL  Final     Labs: Basic Metabolic Panel: Recent Labs  Lab 10/19/23 1401 10/19/23 2014 10/20/23 0204 10/20/23 1344 10/21/23 0536 10/21/23 1636 10/22/23 1150 10/23/23 0451 10/24/23 0354 10/25/23 0453 10/26/23 0500  NA  --   --  144   < > 142   < > 139 140 141 139 141  K 2.9*  --  2.6*   < > 3.3*   < > 3.2* 3.1* 3.2* 3.9 4.0  CL  --   --  94*   < > 97*   < > 94* 96* 99 99 100  CO2  --   --  38*   < > 34*   < > 32 34* 34* 34* 33*  GLUCOSE  --   --  110*   < > 101*   < > 178* 138* 141* 124* 178*  BUN  --   --  16   < > 11   < > 12 13 19 20 14   CREATININE  --   --  0.33*   < > 0.35*   < > 0.41* 0.39* 0.48 0.44 0.46  CALCIUM  --   --  8.2*   < > 8.3*   < > 8.3* 8.3* 8.0* 7.7* 8.2*  MG 1.7  --  2.1  --   --   --   --  2.1 2.0  --   --   PHOS  --  3.0 2.6  --  2.1*  --   --  3.2 3.4  --   --    < > = values in this interval not displayed.   Liver Function Tests: Recent Labs  Lab 10/20/23 0204 10/21/23 0536  ALBUMIN 2.6* 2.6*   No results for input(s): "LIPASE", "AMYLASE" in the last 168 hours. No results for input(s): "AMMONIA" in the last 168 hours. CBC: Recent Labs  Lab 10/20/23 0204 10/21/23 0536 10/22/23 0335  WBC 6.6 7.1 6.6  HGB 10.7* 10.3* 10.8*  HCT 34.8* 34.7* 35.6*  MCV 85.5 86.8 82.4  PLT 64* 61* 85*   Cardiac Enzymes: No results for input(s): "CKTOTAL", "CKMB", "CKMBINDEX", "TROPONINI" in the last 168 hours. BNP: BNP (last 3 results) Recent Labs     10/11/23 1801 10/17/23 0255  BNP 314.2* 256.3*    ProBNP (last 3 results) No results for input(s): "PROBNP" in the last 8760 hours.  CBG: Recent Labs  Lab 10/25/23 2036 10/26/23 0034 10/26/23 0403 10/26/23 0723 10/26/23 1143  GLUCAP 119* 141* 128* 144* 149*       Signed:  Silvano Bilis MD.  [facility] 10/26/2023, 12:05 PM

## 2024-02-05 ENCOUNTER — Telehealth: Payer: Self-pay | Admitting: Nurse Practitioner

## 2024-02-05 NOTE — Telephone Encounter (Signed)
[employer] is calling in to check on the status of a prior authorization form that was sent over earlier in February. [employer] says they are going to refax it. They need the last two office visits attached to the form when it's sent back in. They can be reached at [phone]

## 2024-02-13 NOTE — Telephone Encounter (Signed)
 Daisy w/ [employer] calling back.  She asked if we could send the last OV notes.  Advised Daisy since the dr did not order th back brace pt is requesting, we cannot send any information w/out a release form. In addition, pt not seen since 07/2023 and advised she may need appt if she is requesting a back brace.

## 2024-02-22 ENCOUNTER — Telehealth: Payer: Self-pay | Admitting: Nurse Practitioner

## 2024-02-22 NOTE — Telephone Encounter (Signed)
 Copied from CRM [id number]. Topic: General - Call Back - No Documentation >> Feb 21, 2024  3:32 PM Higinio Roger wrote: Reason for CRM: RX pharmacy would like a callback at [phone] to discuss paperwork sent via fax for [employer]

## 2024-02-22 NOTE — Telephone Encounter (Signed)
 Called and LVM letting Tami Cummings know that her provider did not approve for the patient to have a CGM. Paperwork will not be completed.

## 2024-02-22 NOTE — Telephone Encounter (Signed)
 Renea Ee from [facility] called to f/u on the signed prescription and the last 2 office visit notes. Please f/u with Renea Ee at [phone]

## 2024-07-16 ENCOUNTER — Telehealth: Payer: Self-pay | Admitting: Nurse Practitioner

## 2024-07-16 NOTE — Telephone Encounter (Signed)
 Copied from CRM [id number]. Topic: Medicare AWV >> Jul 16, 2024 10:32 AM Nathanel DEL wrote: Reason for CRM: Called LVM 07/16/2024 to schedule AWV. Please schedule Virtual or Telehealth visits ONLY.   Nathanel Paschal; Care Guide Ambulatory Clinical Support [place] [facility] Direct Dial : [phone]
